# Patient Record
Sex: Female | Born: 1935
Health system: Southern US, Community
[De-identification: ages and names within clinical notes are randomized; demographics above are authoritative.]

## PROBLEM LIST (undated history)

## (undated) DIAGNOSIS — Z9289 Personal history of other medical treatment: Secondary | ICD-10-CM

## (undated) DIAGNOSIS — F0390 Unspecified dementia without behavioral disturbance: Secondary | ICD-10-CM

## (undated) DIAGNOSIS — N189 Chronic kidney disease, unspecified: Secondary | ICD-10-CM

## (undated) DIAGNOSIS — I429 Cardiomyopathy, unspecified: Secondary | ICD-10-CM

## (undated) DIAGNOSIS — C50919 Malignant neoplasm of unspecified site of unspecified female breast: Secondary | ICD-10-CM

## (undated) DIAGNOSIS — E039 Hypothyroidism, unspecified: Secondary | ICD-10-CM

## (undated) DIAGNOSIS — I5022 Chronic systolic (congestive) heart failure: Secondary | ICD-10-CM

## (undated) DIAGNOSIS — M1990 Unspecified osteoarthritis, unspecified site: Secondary | ICD-10-CM

## (undated) HISTORY — DX: Hypothyroidism, unspecified: E03.9

## (undated) HISTORY — DX: Chronic systolic (congestive) heart failure: I50.22

## (undated) HISTORY — PX: KNEE SURGERY: SHX244

## (undated) HISTORY — PX: SHOULDER SURGERY: SHX246

## (undated) HISTORY — DX: Chronic kidney disease, unspecified: N18.9

## (undated) HISTORY — DX: Malignant neoplasm of unspecified site of unspecified female breast: C50.919

## (undated) HISTORY — PX: CHOLECYSTECTOMY: SHX55

## (undated) HISTORY — PX: BILATERAL CARPAL TUNNEL RELEASE: SHX6508

## (undated) HISTORY — PX: EYE SURGERY: SHX253

## (undated) HISTORY — PX: BACK SURGERY: SHX140

## (undated) HISTORY — DX: Personal history of other medical treatment: Z92.89

## (undated) HISTORY — PX: TONSILLECTOMY: SUR1361

## (undated) HISTORY — DX: Cardiomyopathy, unspecified: I42.9

## (undated) HISTORY — PX: ABDOMINAL HYSTERECTOMY: SHX81

---

## 1997-12-14 ENCOUNTER — Encounter: Admission: RE | Admit: 1997-12-14 | Discharge: 1998-03-14 | Payer: Self-pay | Admitting: Anesthesiology

## 1998-04-16 ENCOUNTER — Encounter: Payer: Self-pay | Admitting: Neurosurgery

## 1998-04-18 ENCOUNTER — Encounter: Payer: Self-pay | Admitting: Neurosurgery

## 1998-04-18 ENCOUNTER — Inpatient Hospital Stay (HOSPITAL_COMMUNITY): Admission: RE | Admit: 1998-04-18 | Discharge: 1998-04-24 | Payer: Self-pay | Admitting: Neurosurgery

## 2001-09-20 ENCOUNTER — Encounter (INDEPENDENT_AMBULATORY_CARE_PROVIDER_SITE_OTHER): Payer: Self-pay | Admitting: *Deleted

## 2001-09-20 ENCOUNTER — Ambulatory Visit (HOSPITAL_COMMUNITY): Admission: RE | Admit: 2001-09-20 | Discharge: 2001-09-20 | Payer: Self-pay | Admitting: Gastroenterology

## 2002-07-26 ENCOUNTER — Ambulatory Visit (HOSPITAL_COMMUNITY): Admission: RE | Admit: 2002-07-26 | Discharge: 2002-07-26 | Payer: Self-pay | Admitting: Neurosurgery

## 2002-07-26 ENCOUNTER — Encounter: Payer: Self-pay | Admitting: Neurosurgery

## 2004-09-11 ENCOUNTER — Encounter: Admission: RE | Admit: 2004-09-11 | Discharge: 2004-09-11 | Payer: Self-pay | Admitting: Radiology

## 2004-09-16 ENCOUNTER — Encounter: Admission: RE | Admit: 2004-09-16 | Discharge: 2004-09-16 | Payer: Self-pay | Admitting: General Surgery

## 2004-09-18 ENCOUNTER — Encounter (INDEPENDENT_AMBULATORY_CARE_PROVIDER_SITE_OTHER): Payer: Self-pay | Admitting: Specialist

## 2004-09-18 ENCOUNTER — Encounter (INDEPENDENT_AMBULATORY_CARE_PROVIDER_SITE_OTHER): Payer: Self-pay | Admitting: General Surgery

## 2004-09-18 ENCOUNTER — Ambulatory Visit (HOSPITAL_COMMUNITY): Admission: RE | Admit: 2004-09-18 | Discharge: 2004-09-18 | Payer: Self-pay | Admitting: General Surgery

## 2004-09-18 ENCOUNTER — Ambulatory Visit (HOSPITAL_BASED_OUTPATIENT_CLINIC_OR_DEPARTMENT_OTHER): Admission: RE | Admit: 2004-09-18 | Discharge: 2004-09-19 | Payer: Self-pay | Admitting: General Surgery

## 2004-09-30 ENCOUNTER — Ambulatory Visit: Payer: Self-pay | Admitting: Oncology

## 2004-10-14 ENCOUNTER — Ambulatory Visit: Admission: RE | Admit: 2004-10-14 | Discharge: 2004-12-18 | Payer: Self-pay | Admitting: *Deleted

## 2004-12-10 ENCOUNTER — Ambulatory Visit: Payer: Self-pay | Admitting: Oncology

## 2005-08-08 ENCOUNTER — Ambulatory Visit: Payer: Self-pay | Admitting: Oncology

## 2005-08-12 LAB — CBC WITH DIFFERENTIAL/PLATELET
BASO%: 0.4 % (ref 0.0–2.0)
Basophils Absolute: 0 10*3/uL (ref 0.0–0.1)
EOS%: 2.7 % (ref 0.0–7.0)
HGB: 12.7 g/dL (ref 11.6–15.9)
MCH: 31.2 pg (ref 26.0–34.0)
MCHC: 34.6 g/dL (ref 32.0–36.0)
MCV: 90.2 fL (ref 81.0–101.0)
MONO%: 7.4 % (ref 0.0–13.0)
RBC: 4.06 10*6/uL (ref 3.70–5.32)
RDW: 13.9 % (ref 11.3–14.5)
lymph#: 1.6 10*3/uL (ref 0.9–3.3)

## 2005-08-12 LAB — COMPREHENSIVE METABOLIC PANEL
ALT: 21 U/L (ref 0–40)
AST: 28 U/L (ref 0–37)
Albumin: 4.4 g/dL (ref 3.5–5.2)
Alkaline Phosphatase: 60 U/L (ref 39–117)
Calcium: 9.4 mg/dL (ref 8.4–10.5)
Chloride: 109 mEq/L (ref 96–112)
Potassium: 4.2 mEq/L (ref 3.5–5.3)
Sodium: 142 mEq/L (ref 135–145)
Total Protein: 6.9 g/dL (ref 6.0–8.3)

## 2005-08-12 LAB — CANCER ANTIGEN 27.29: CA 27.29: 20 U/mL (ref 0–39)

## 2005-11-06 ENCOUNTER — Ambulatory Visit: Payer: Self-pay | Admitting: Oncology

## 2005-11-10 LAB — CBC WITH DIFFERENTIAL/PLATELET
BASO%: 0.4 % (ref 0.0–2.0)
Basophils Absolute: 0 10*3/uL (ref 0.0–0.1)
EOS%: 1.3 % (ref 0.0–7.0)
HCT: 34.6 % — ABNORMAL LOW (ref 34.8–46.6)
MCH: 31 pg (ref 26.0–34.0)
MCHC: 34 g/dL (ref 32.0–36.0)
MCV: 91.2 fL (ref 81.0–101.0)
MONO%: 5.9 % (ref 0.0–13.0)
NEUT%: 69.6 % (ref 39.6–76.8)
lymph#: 1.8 10*3/uL (ref 0.9–3.3)

## 2006-02-18 ENCOUNTER — Ambulatory Visit: Payer: Self-pay | Admitting: Oncology

## 2006-02-23 LAB — CBC WITH DIFFERENTIAL/PLATELET
Basophils Absolute: 0 10*3/uL (ref 0.0–0.1)
EOS%: 1.6 % (ref 0.0–7.0)
Eosinophils Absolute: 0.1 10*3/uL (ref 0.0–0.5)
HCT: 35.5 % (ref 34.8–46.6)
HGB: 12.1 g/dL (ref 11.6–15.9)
MONO#: 0.5 10*3/uL (ref 0.1–0.9)
NEUT#: 4.8 10*3/uL (ref 1.5–6.5)
NEUT%: 71.9 % (ref 39.6–76.8)
RDW: 13.5 % (ref 11.3–14.5)
WBC: 6.7 10*3/uL (ref 3.9–10.0)
lymph#: 1.3 10*3/uL (ref 0.9–3.3)

## 2006-02-23 LAB — COMPREHENSIVE METABOLIC PANEL
AST: 24 U/L (ref 0–37)
Albumin: 4.2 g/dL (ref 3.5–5.2)
BUN: 17 mg/dL (ref 6–23)
CO2: 23 mEq/L (ref 19–32)
Calcium: 9.2 mg/dL (ref 8.4–10.5)
Chloride: 107 mEq/L (ref 96–112)
Glucose, Bld: 84 mg/dL (ref 70–99)
Potassium: 4.3 mEq/L (ref 3.5–5.3)

## 2006-02-23 LAB — CANCER ANTIGEN 27.29: CA 27.29: 23 U/mL (ref 0–39)

## 2006-05-26 ENCOUNTER — Ambulatory Visit: Payer: Self-pay | Admitting: Oncology

## 2006-05-28 LAB — COMPREHENSIVE METABOLIC PANEL
AST: 30 U/L (ref 0–37)
Albumin: 3.8 g/dL (ref 3.5–5.2)
Alkaline Phosphatase: 73 U/L (ref 39–117)
Glucose, Bld: 91 mg/dL (ref 70–99)
Potassium: 4 mEq/L (ref 3.5–5.3)
Sodium: 139 mEq/L (ref 135–145)
Total Protein: 6.8 g/dL (ref 6.0–8.3)

## 2006-05-28 LAB — CBC WITH DIFFERENTIAL/PLATELET
EOS%: 1.7 % (ref 0.0–7.0)
Eosinophils Absolute: 0.1 10*3/uL (ref 0.0–0.5)
MCV: 87.4 fL (ref 81.0–101.0)
MONO%: 7.1 % (ref 0.0–13.0)
NEUT#: 5 10*3/uL (ref 1.5–6.5)
RBC: 3.82 10*6/uL (ref 3.70–5.32)
RDW: 14 % (ref 11.3–14.5)
lymph#: 1.4 10*3/uL (ref 0.9–3.3)

## 2006-06-25 ENCOUNTER — Encounter: Admission: RE | Admit: 2006-06-25 | Discharge: 2006-06-25 | Payer: Self-pay | Admitting: Family Medicine

## 2006-08-24 ENCOUNTER — Ambulatory Visit (HOSPITAL_COMMUNITY): Admission: RE | Admit: 2006-08-24 | Discharge: 2006-08-24 | Payer: Self-pay | Admitting: Oncology

## 2006-08-24 ENCOUNTER — Ambulatory Visit: Payer: Self-pay | Admitting: Oncology

## 2006-08-24 LAB — CBC WITH DIFFERENTIAL/PLATELET
EOS%: 1.7 % (ref 0.0–7.0)
MCH: 31 pg (ref 26.0–34.0)
MCV: 87.6 fL (ref 81.0–101.0)
MONO%: 8.1 % (ref 0.0–13.0)
RBC: 3.95 10*6/uL (ref 3.70–5.32)
RDW: 15.5 % — ABNORMAL HIGH (ref 11.3–14.5)

## 2006-08-24 LAB — COMPREHENSIVE METABOLIC PANEL
AST: 30 U/L (ref 0–37)
Albumin: 4.1 g/dL (ref 3.5–5.2)
Alkaline Phosphatase: 67 U/L (ref 39–117)
Potassium: 4.2 mEq/L (ref 3.5–5.3)
Sodium: 139 mEq/L (ref 135–145)
Total Protein: 7.3 g/dL (ref 6.0–8.3)

## 2006-11-20 ENCOUNTER — Ambulatory Visit: Payer: Self-pay | Admitting: Oncology

## 2006-11-24 LAB — COMPREHENSIVE METABOLIC PANEL
ALT: 15 U/L (ref 0–35)
AST: 26 U/L (ref 0–37)
Alkaline Phosphatase: 69 U/L (ref 39–117)
Glucose, Bld: 72 mg/dL (ref 70–99)
Sodium: 141 mEq/L (ref 135–145)
Total Bilirubin: 0.3 mg/dL (ref 0.3–1.2)
Total Protein: 6.6 g/dL (ref 6.0–8.3)

## 2006-11-24 LAB — CBC WITH DIFFERENTIAL/PLATELET
BASO%: 1.5 % (ref 0.0–2.0)
HCT: 31.1 % — ABNORMAL LOW (ref 34.8–46.6)
LYMPH%: 28.5 % (ref 14.0–48.0)
MCHC: 35.2 g/dL (ref 32.0–36.0)
MONO#: 0.5 10*3/uL (ref 0.1–0.9)
NEUT%: 51.5 % (ref 39.6–76.8)
Platelets: 271 10*3/uL (ref 145–400)
WBC: 5.9 10*3/uL (ref 3.9–10.0)

## 2007-05-21 ENCOUNTER — Ambulatory Visit: Payer: Self-pay | Admitting: Oncology

## 2007-07-02 ENCOUNTER — Ambulatory Visit: Payer: Self-pay | Admitting: Oncology

## 2007-07-06 LAB — COMPREHENSIVE METABOLIC PANEL
ALT: 21 U/L (ref 0–35)
AST: 33 U/L (ref 0–37)
CO2: 25 mEq/L (ref 19–32)
Calcium: 9.1 mg/dL (ref 8.4–10.5)
Chloride: 110 mEq/L (ref 96–112)
Sodium: 141 mEq/L (ref 135–145)
Total Bilirubin: 0.7 mg/dL (ref 0.3–1.2)
Total Protein: 6.8 g/dL (ref 6.0–8.3)

## 2007-07-06 LAB — CBC WITH DIFFERENTIAL/PLATELET
BASO%: 0.4 % (ref 0.0–2.0)
EOS%: 4.8 % (ref 0.0–7.0)
HCT: 33 % — ABNORMAL LOW (ref 34.8–46.6)
LYMPH%: 26.8 % (ref 14.0–48.0)
MCH: 30.6 pg (ref 26.0–34.0)
MCHC: 34.7 g/dL (ref 32.0–36.0)
NEUT%: 59.4 % (ref 39.6–76.8)
Platelets: 251 10*3/uL (ref 145–400)
lymph#: 1.8 10*3/uL (ref 0.9–3.3)

## 2007-07-06 LAB — CANCER ANTIGEN 27.29: CA 27.29: 19 U/mL (ref 0–39)

## 2008-02-24 ENCOUNTER — Ambulatory Visit: Payer: Self-pay | Admitting: Oncology

## 2008-03-09 LAB — CBC WITH DIFFERENTIAL/PLATELET
BASO%: 0.6 % (ref 0.0–2.0)
EOS%: 7.7 % — ABNORMAL HIGH (ref 0.0–7.0)
MCH: 30.3 pg (ref 25.1–34.0)
MCHC: 34.1 g/dL (ref 31.5–36.0)
NEUT%: 55.7 % (ref 38.4–76.8)
RBC: 3.8 10*6/uL (ref 3.70–5.45)
RDW: 14.3 % (ref 11.2–14.5)
lymph#: 1.6 10*3/uL (ref 0.9–3.3)

## 2008-03-09 LAB — COMPREHENSIVE METABOLIC PANEL
ALT: 19 U/L (ref 0–35)
AST: 30 U/L (ref 0–37)
Calcium: 8.5 mg/dL (ref 8.4–10.5)
Chloride: 111 mEq/L (ref 96–112)
Creatinine, Ser: 0.69 mg/dL (ref 0.40–1.20)
Potassium: 3.4 mEq/L — ABNORMAL LOW (ref 3.5–5.3)
Sodium: 140 mEq/L (ref 135–145)

## 2008-09-13 ENCOUNTER — Ambulatory Visit: Payer: Self-pay | Admitting: Oncology

## 2008-09-15 LAB — COMPREHENSIVE METABOLIC PANEL
AST: 22 U/L (ref 0–37)
Albumin: 3.8 g/dL (ref 3.5–5.2)
Alkaline Phosphatase: 55 U/L (ref 39–117)
BUN: 22 mg/dL (ref 6–23)
Potassium: 4.5 mEq/L (ref 3.5–5.3)
Sodium: 143 mEq/L (ref 135–145)
Total Bilirubin: 0.4 mg/dL (ref 0.3–1.2)
Total Protein: 6.5 g/dL (ref 6.0–8.3)

## 2008-09-15 LAB — CBC WITH DIFFERENTIAL/PLATELET
EOS%: 8.3 % — ABNORMAL HIGH (ref 0.0–7.0)
LYMPH%: 24.3 % (ref 14.0–49.7)
MCH: 30.3 pg (ref 25.1–34.0)
MCV: 89.1 fL (ref 79.5–101.0)
MONO%: 9.6 % (ref 0.0–14.0)
RBC: 3.62 10*6/uL — ABNORMAL LOW (ref 3.70–5.45)
RDW: 14.8 % — ABNORMAL HIGH (ref 11.2–14.5)

## 2008-09-15 LAB — RETICULOCYTES
Immature Retic Fract: 9.3 % (ref 0.00–10.70)
Retic %: 0.94 % (ref 0.50–1.50)

## 2008-09-15 LAB — FERRITIN: Ferritin: 9 ng/mL — ABNORMAL LOW (ref 10–291)

## 2008-09-15 LAB — VITAMIN B12: Vitamin B-12: 1235 pg/mL — ABNORMAL HIGH (ref 211–911)

## 2008-09-15 LAB — FOLATE: Folate: >20 ng/mL

## 2008-10-19 ENCOUNTER — Ambulatory Visit: Payer: Self-pay | Admitting: Oncology

## 2008-10-23 LAB — CBC & DIFF AND RETIC
BASO%: 0.5 % (ref 0.0–2.0)
EOS%: 5 % (ref 0.0–7.0)
Eosinophils Absolute: 0.4 10*3/uL (ref 0.0–0.5)
HCT: 32.7 % — ABNORMAL LOW (ref 34.8–46.6)
HGB: 10.9 g/dL — ABNORMAL LOW (ref 11.6–15.9)
Immature Retic Fract: 4.6 % (ref 0.00–10.70)
LYMPH%: 21.3 % (ref 14.0–49.7)
MCH: 30 pg (ref 25.1–34.0)
MCHC: 33.3 g/dL (ref 31.5–36.0)
MONO#: 0.7 10*3/uL (ref 0.1–0.9)
MONO%: 7.9 % (ref 0.0–14.0)
RDW: 15.3 % — ABNORMAL HIGH (ref 11.2–14.5)
Retic %: 1.12 % (ref 0.50–1.50)
lymph#: 1.9 10*3/uL (ref 0.9–3.3)
nRBC: 0 % (ref 0–0)

## 2008-10-23 LAB — FERRITIN: Ferritin: 19 ng/mL (ref 10–291)

## 2008-11-01 LAB — CBC WITH DIFFERENTIAL/PLATELET
EOS%: 7.5 % — ABNORMAL HIGH (ref 0.0–7.0)
HCT: 33.8 % — ABNORMAL LOW (ref 34.8–46.6)
MCH: 30.5 pg (ref 25.1–34.0)
MCHC: 33.4 g/dL (ref 31.5–36.0)
MCV: 91.4 fL (ref 79.5–101.0)
NEUT%: 53.2 % (ref 38.4–76.8)
Platelets: 212 10*3/uL (ref 145–400)
RBC: 3.7 10*6/uL (ref 3.70–5.45)
RDW: 15.3 % — ABNORMAL HIGH (ref 11.2–14.5)
WBC: 6.6 10*3/uL (ref 3.9–10.3)

## 2008-12-04 ENCOUNTER — Ambulatory Visit: Payer: Self-pay | Admitting: Oncology

## 2008-12-06 LAB — CBC & DIFF AND RETIC
HGB: 10.9 g/dL — ABNORMAL LOW (ref 11.6–15.9)
MCH: 31.1 pg (ref 25.1–34.0)
MCHC: 33.2 g/dL (ref 31.5–36.0)
NEUT%: 66.8 % (ref 38.4–76.8)
Platelets: 200 10*3/uL (ref 145–400)
RBC: 3.5 10*6/uL — ABNORMAL LOW (ref 3.70–5.45)
Retic Ct Abs: 50.05 10*3/uL (ref 18.30–72.70)
WBC: 7.3 10*3/uL (ref 3.9–10.3)

## 2008-12-06 LAB — FERRITIN: Ferritin: 362 ng/mL — ABNORMAL HIGH (ref 10–291)

## 2009-01-18 ENCOUNTER — Ambulatory Visit: Payer: Self-pay | Admitting: Oncology

## 2009-01-22 ENCOUNTER — Ambulatory Visit (HOSPITAL_COMMUNITY): Admission: RE | Admit: 2009-01-22 | Discharge: 2009-01-22 | Payer: Self-pay | Admitting: Oncology

## 2009-01-22 LAB — COMPREHENSIVE METABOLIC PANEL
AST: 30 U/L (ref 0–37)
Alkaline Phosphatase: 44 U/L (ref 39–117)
CO2: 26 mEq/L (ref 19–32)
Creatinine, Ser: 0.9 mg/dL (ref 0.40–1.20)
Glucose, Bld: 82 mg/dL (ref 70–99)
Potassium: 4.1 mEq/L (ref 3.5–5.3)
Total Protein: 6.8 g/dL (ref 6.0–8.3)

## 2009-01-22 LAB — CBC & DIFF AND RETIC
BASO%: 0.5 % (ref 0.0–2.0)
Basophils Absolute: 0 10*3/uL (ref 0.0–0.1)
Eosinophils Absolute: 0.3 10*3/uL (ref 0.0–0.5)
HGB: 11.4 g/dL — ABNORMAL LOW (ref 11.6–15.9)
LYMPH%: 19.5 % (ref 14.0–49.7)
NEUT#: 5.6 10*3/uL (ref 1.5–6.5)
RBC: 3.61 10*6/uL — ABNORMAL LOW (ref 3.70–5.45)
RDW: 13.5 % (ref 11.2–14.5)
Retic %: 1.19 % (ref 0.50–1.50)
lymph#: 1.6 10*3/uL (ref 0.9–3.3)

## 2009-01-22 LAB — CANCER ANTIGEN 27.29: CA 27.29: 19 U/mL (ref 0–39)

## 2009-05-15 ENCOUNTER — Ambulatory Visit: Payer: Self-pay | Admitting: Oncology

## 2009-05-16 LAB — COMPREHENSIVE METABOLIC PANEL
ALT: 14 U/L (ref 0–35)
Albumin: 4.3 g/dL (ref 3.5–5.2)
BUN: 16 mg/dL (ref 6–23)
CO2: 20 mEq/L (ref 19–32)
Calcium: 9.5 mg/dL (ref 8.4–10.5)
Chloride: 106 mEq/L (ref 96–112)
Total Bilirubin: 0.5 mg/dL (ref 0.3–1.2)
Total Protein: 6.9 g/dL (ref 6.0–8.3)

## 2009-05-16 LAB — CBC & DIFF AND RETIC
BASO%: 0.3 % (ref 0.0–2.0)
Basophils Absolute: 0 10*3/uL (ref 0.0–0.1)
HGB: 12.6 g/dL (ref 11.6–15.9)
Immature Retic Fract: 3.8 % (ref 0.00–10.70)
MCHC: 33.8 g/dL (ref 31.5–36.0)
MONO#: 0.5 10*3/uL (ref 0.1–0.9)
MONO%: 7.3 % (ref 0.0–14.0)
RBC: 4.03 10*6/uL (ref 3.70–5.45)
RDW: 13.3 % (ref 11.2–14.5)
WBC: 6.6 10*3/uL (ref 3.9–10.3)
lymph#: 1.8 10*3/uL (ref 0.9–3.3)

## 2009-05-16 LAB — FERRITIN: Ferritin: 203 ng/mL (ref 10–291)

## 2009-05-16 LAB — VITAMIN D 25 HYDROXY (VIT D DEFICIENCY, FRACTURES): Vit D, 25-Hydroxy: 34 ng/mL (ref 30–89)

## 2009-09-17 ENCOUNTER — Ambulatory Visit: Payer: Self-pay | Admitting: Oncology

## 2009-09-17 LAB — CBC WITH DIFFERENTIAL/PLATELET
BASO%: 0.8 % (ref 0.0–2.0)
Basophils Absolute: 0.1 10*3/uL (ref 0.0–0.1)
EOS%: 7.1 % — ABNORMAL HIGH (ref 0.0–7.0)
HCT: 37 % (ref 34.8–46.6)
HGB: 12.5 g/dL (ref 11.6–15.9)
LYMPH%: 16.9 % (ref 14.0–49.7)
MCV: 94 fL (ref 79.5–101.0)
MONO%: 9.1 % (ref 0.0–14.0)
NEUT#: 4.9 10*3/uL (ref 1.5–6.5)
NEUT%: 66.1 % (ref 38.4–76.8)
Platelets: 252 10*3/uL (ref 145–400)
RDW: 13.9 % (ref 11.2–14.5)

## 2009-09-17 LAB — FERRITIN: Ferritin: 205 ng/mL (ref 10–291)

## 2010-05-21 ENCOUNTER — Other Ambulatory Visit: Payer: Self-pay | Admitting: Oncology

## 2010-05-21 ENCOUNTER — Encounter (HOSPITAL_BASED_OUTPATIENT_CLINIC_OR_DEPARTMENT_OTHER): Payer: Medicare Other | Admitting: Oncology

## 2010-05-21 DIAGNOSIS — D649 Anemia, unspecified: Secondary | ICD-10-CM

## 2010-05-21 DIAGNOSIS — C50219 Malignant neoplasm of upper-inner quadrant of unspecified female breast: Secondary | ICD-10-CM

## 2010-05-21 DIAGNOSIS — D509 Iron deficiency anemia, unspecified: Secondary | ICD-10-CM

## 2010-05-21 LAB — CBC WITH DIFFERENTIAL/PLATELET
BASO%: 0.5 % (ref 0.0–2.0)
EOS%: 4.8 % (ref 0.0–7.0)
HCT: 36.6 % (ref 34.8–46.6)
HGB: 12.6 g/dL (ref 11.6–15.9)
LYMPH%: 24.2 % (ref 14.0–49.7)
MCH: 31.6 pg (ref 25.1–34.0)
NEUT#: 3.8 10*3/uL (ref 1.5–6.5)
Platelets: 247 10*3/uL (ref 145–400)
WBC: 6.1 10*3/uL (ref 3.9–10.3)
lymph#: 1.5 10*3/uL (ref 0.9–3.3)

## 2010-05-21 LAB — COMPREHENSIVE METABOLIC PANEL
ALT: 15 U/L (ref 0–35)
Albumin: 4.2 g/dL (ref 3.5–5.2)
Alkaline Phosphatase: 44 U/L (ref 39–117)
BUN: 21 mg/dL (ref 6–23)
CO2: 21 mEq/L (ref 19–32)
Chloride: 107 mEq/L (ref 96–112)
Creatinine, Ser: 0.99 mg/dL (ref 0.40–1.20)
Potassium: 4.6 mEq/L (ref 3.5–5.3)
Total Bilirubin: 0.5 mg/dL (ref 0.3–1.2)

## 2010-05-21 LAB — FERRITIN: Ferritin: 122 ng/mL (ref 10–291)

## 2010-05-24 NOTE — Op Note (Signed)
Andrea Gilmore, Andrea Gilmore             ACCOUNT NO.:  [id number]   MEDICAL RECORD NO.:  [id number]          PATIENT TYPE:  AMB   LOCATION:  DSC                          FACILITY:  [facility]   PHYSICIAN:  Gita Kudo, M.D. DATE OF BIRTH:  15-Apr-1925   DATE OF PROCEDURE:  09/18/2004  DATE OF DISCHARGE:                                 OPERATIVE REPORT   OPERATIVE PROCEDURE:  1.  Left partial mastectomy.  2.  Left axillary sentinel lymph node biopsy.   SURGEON:  Gita Kudo, M.D.   ANESTHESIA:  General.   PREOPERATIVE DIAGNOSIS:  Ductal carcinoma in situ, locally extensive, left  breast.   POSTOPERATIVE DIAGNOSES:  1.  Ductal carcinoma in situ, locally extensive, left breast, radiologically      very good margins.  2.  Histologic-negative sentinel lymph node.   CLINICAL SUMMARY:  [age]-year-old female in good general health  without any specific problems, found to have an abnormal calcification on  mammograms.  Biopsy showed DCIS.  MRI did not show any other abnormality.  She comes in for partial mastectomy and sentinel node biopsy because of the  size of the DCIS.   OPERATIVE FINDINGS:  The area was well-bracketed by Dr. Jeralyn Ruths.  The report came back stating that we got around the markers well.  The lymph  node was hot and blue, and after removal, the count went down to negligible.  Histology showed that this was benign.   OPERATIVE PROCEDURE:  Under satisfactory general anesthesia, the patient was  positioned, infiltrated with Lymphazurin blue subareolarly and then prepped,  draped and positioned in a standard fashion.  Referring to the wire  placement by Dr. Yolanda Bonine, an elliptical incision was made, transversely  oriented, encompassing the wires.  Then staying widely around the wires, I  continued the dissection down to the chest wall and removed this portion of  the breast which was extending from approximately 9 o'clock through 1  o'clock.  The  specimen was then marked with clips and sent for pathologic  verification.  The wound lavaged with saline, infiltrated with Marcaine.  Made hemostatic by cautery and then a packing applied temporarily.   The NeoProbe was then used to scan the axilla and then a curved incision  made over the hottest area.  This was carried down to the pectoralis muscle,  which was retracted medially and self-retaining retractors placed.  Using  the blue dye as a guide, I identified 1 medium-sized node that was quite hot  on NeoProbe scanning.  This was removed after applying clips all around.  Scanning showed it to be quite hot and it was sent for pathologic exam.  The  axilla was then scanned again with the NeoProbe and no further activity.   Both wounds were lavaged with saline, infiltrated with Xylocaine and closed  in layers with 2-0 and 3-0 Vicryl and staples for skin.   Dr. Yolanda Bonine reported that the area in question was well-removed  radiologically and Dr. Laureen Ochs that the lymph node was negative.  Therefore, a  sterile absorbent dressing was applied and the patient went to  the recovery  room from the operating room in good condition without complication.           ______________________________  Gita Kudo, M.D.     MRL/MEDQ  D:  09/18/2004  T:  09/19/2004  Job:  [id number]   cc:   Jeralyn Ruths MD   Donia Guiles, M.D.  [address] [place]  [place] [address]  Fax: [phone]

## 2010-05-28 ENCOUNTER — Encounter (HOSPITAL_BASED_OUTPATIENT_CLINIC_OR_DEPARTMENT_OTHER): Payer: Medicare Other | Admitting: Oncology

## 2010-05-28 ENCOUNTER — Ambulatory Visit (HOSPITAL_COMMUNITY)
Admission: RE | Admit: 2010-05-28 | Discharge: 2010-05-28 | Disposition: A | Discharge status: Home / Self Care | Payer: Medicare Other | Source: Ambulatory Visit | Attending: Oncology | Admitting: Oncology

## 2010-05-28 ENCOUNTER — Other Ambulatory Visit: Payer: Self-pay | Admitting: Oncology

## 2010-05-28 DIAGNOSIS — C50919 Malignant neoplasm of unspecified site of unspecified female breast: Secondary | ICD-10-CM

## 2010-05-28 DIAGNOSIS — R059 Cough, unspecified: Secondary | ICD-10-CM | POA: Insufficient documentation

## 2010-05-28 DIAGNOSIS — R0602 Shortness of breath: Secondary | ICD-10-CM

## 2010-05-28 DIAGNOSIS — R05 Cough: Secondary | ICD-10-CM | POA: Insufficient documentation

## 2010-05-28 DIAGNOSIS — C50219 Malignant neoplasm of upper-inner quadrant of unspecified female breast: Secondary | ICD-10-CM

## 2010-07-05 ENCOUNTER — Ambulatory Visit
Admission: RE | Admit: 2010-07-05 | Discharge: 2010-07-05 | Disposition: A | Discharge status: Home / Self Care | Payer: Medicare Other | Source: Ambulatory Visit | Attending: Family Medicine | Admitting: Family Medicine

## 2010-07-05 ENCOUNTER — Other Ambulatory Visit: Payer: Self-pay | Admitting: Family Medicine

## 2010-07-05 DIAGNOSIS — R202 Paresthesia of skin: Secondary | ICD-10-CM

## 2010-07-12 ENCOUNTER — Other Ambulatory Visit: Payer: Self-pay | Admitting: Family Medicine

## 2010-07-12 DIAGNOSIS — R202 Paresthesia of skin: Secondary | ICD-10-CM

## 2010-07-12 DIAGNOSIS — R200 Anesthesia of skin: Secondary | ICD-10-CM

## 2010-07-19 ENCOUNTER — Ambulatory Visit
Admission: RE | Admit: 2010-07-19 | Discharge: 2010-07-19 | Disposition: A | Discharge status: Home / Self Care | Payer: Medicare Other | Source: Ambulatory Visit | Attending: Family Medicine | Admitting: Family Medicine

## 2010-07-19 DIAGNOSIS — R200 Anesthesia of skin: Secondary | ICD-10-CM

## 2010-07-19 DIAGNOSIS — R202 Paresthesia of skin: Secondary | ICD-10-CM

## 2010-10-21 ENCOUNTER — Emergency Department (HOSPITAL_COMMUNITY): Payer: Medicare Other

## 2010-10-21 ENCOUNTER — Inpatient Hospital Stay (HOSPITAL_COMMUNITY)
Admission: EM | Admit: 2010-10-21 | Discharge: 2010-10-23 | DRG: 069 | Disposition: A | Discharge status: Home / Self Care | Payer: Medicare Other | Source: Ambulatory Visit | Attending: Internal Medicine | Admitting: Internal Medicine

## 2010-10-21 DIAGNOSIS — G459 Transient cerebral ischemic attack, unspecified: Principal | ICD-10-CM | POA: Diagnosis present

## 2010-10-21 DIAGNOSIS — R209 Unspecified disturbances of skin sensation: Secondary | ICD-10-CM | POA: Diagnosis present

## 2010-10-21 DIAGNOSIS — R42 Dizziness and giddiness: Secondary | ICD-10-CM | POA: Diagnosis present

## 2010-10-21 DIAGNOSIS — E039 Hypothyroidism, unspecified: Secondary | ICD-10-CM | POA: Diagnosis present

## 2010-10-21 LAB — CK TOTAL AND CKMB (NOT AT ARMC): CK, MB: 3.6 ng/mL (ref 0.3–4.0)

## 2010-10-21 LAB — COMPREHENSIVE METABOLIC PANEL
ALT: 15 U/L (ref 0–35)
AST: 27 U/L (ref 0–37)
Albumin: 3.9 g/dL (ref 3.5–5.2)
Alkaline Phosphatase: 49 U/L (ref 39–117)
BUN: 17 mg/dL (ref 6–23)
Chloride: 108 mEq/L (ref 96–112)
Potassium: 4.1 mEq/L (ref 3.5–5.1)
Sodium: 140 mEq/L (ref 135–145)
Total Bilirubin: 0.3 mg/dL (ref 0.3–1.2)
Total Protein: 7.1 g/dL (ref 6.0–8.3)

## 2010-10-21 LAB — POCT I-STAT, CHEM 8
BUN: 17 mg/dL (ref 6–23)
Calcium, Ion: 1.17 mmol/L (ref 1.12–1.32)
Glucose, Bld: 101 mg/dL — ABNORMAL HIGH (ref 70–99)
HCT: 36 % (ref 36.0–46.0)
TCO2: 21 mmol/L (ref 0–100)

## 2010-10-21 LAB — GLUCOSE, CAPILLARY: Glucose-Capillary: 95 mg/dL (ref 70–99)

## 2010-10-21 LAB — DIFFERENTIAL
Basophils Relative: 0 % (ref 0–1)
Eosinophils Absolute: 0.1 10*3/uL (ref 0.0–0.7)
Eosinophils Relative: 1 % (ref 0–5)
Lymphs Abs: 1.2 10*3/uL (ref 0.7–4.0)
Monocytes Relative: 5 % (ref 3–12)
Neutrophils Relative %: 80 % — ABNORMAL HIGH (ref 43–77)

## 2010-10-21 LAB — LIPID PANEL
HDL: 54 mg/dL (ref >39)
LDL Cholesterol: 137 mg/dL — ABNORMAL HIGH (ref 0–99)
Total CHOL/HDL Ratio: 4 RATIO
VLDL: 27 mg/dL (ref 0–40)

## 2010-10-21 LAB — TROPONIN I: Troponin I: <0.3 ng/mL (ref <0.30)

## 2010-10-21 LAB — CBC
Hemoglobin: 11.7 g/dL — ABNORMAL LOW (ref 12.0–15.0)
MCH: 30.3 pg (ref 26.0–34.0)
MCHC: 33.9 g/dL (ref 30.0–36.0)
MCV: 89.4 fL (ref 78.0–100.0)

## 2010-10-21 LAB — PROTIME-INR: Prothrombin Time: 13 seconds (ref 11.6–15.2)

## 2010-10-22 LAB — BASIC METABOLIC PANEL
BUN: 14 mg/dL (ref 6–23)
Chloride: 112 mEq/L (ref 96–112)
GFR calc Af Amer: 76 mL/min — ABNORMAL LOW (ref >90)
GFR calc non Af Amer: 65 mL/min — ABNORMAL LOW (ref >90)
Glucose, Bld: 85 mg/dL (ref 70–99)
Potassium: 4.1 mEq/L (ref 3.5–5.1)
Sodium: 143 mEq/L (ref 135–145)

## 2010-10-22 LAB — CARDIAC PANEL(CRET KIN+CKTOT+MB+TROPI)
CK, MB: 3.4 ng/mL (ref 0.3–4.0)
CK, MB: 3.1 ng/mL (ref 0.3–4.0)
Relative Index: INVALID (ref 0.0–2.5)
Total CK: 50 U/L (ref 7–177)
Troponin I: <0.3 ng/mL (ref <0.30)

## 2010-10-22 LAB — CBC
HCT: 33.8 % — ABNORMAL LOW (ref 36.0–46.0)
Hemoglobin: 11.2 g/dL — ABNORMAL LOW (ref 12.0–15.0)
MCH: 30.3 pg (ref 26.0–34.0)
MCV: 91.4 fL (ref 78.0–100.0)
RBC: 3.7 MIL/uL — ABNORMAL LOW (ref 3.87–5.11)
WBC: 6.4 10*3/uL (ref 4.0–10.5)

## 2010-10-23 LAB — CBC
Hemoglobin: 10.9 g/dL — ABNORMAL LOW (ref 12.0–15.0)
MCH: 30.7 pg (ref 26.0–34.0)
MCHC: 34.1 g/dL (ref 30.0–36.0)
Platelets: 236 10*3/uL (ref 150–400)
RBC: 3.55 MIL/uL — ABNORMAL LOW (ref 3.87–5.11)

## 2010-10-23 LAB — BASIC METABOLIC PANEL
Calcium: 9.2 mg/dL (ref 8.4–10.5)
GFR calc non Af Amer: 56 mL/min — ABNORMAL LOW (ref >90)
Glucose, Bld: 91 mg/dL (ref 70–99)
Potassium: 4.8 mEq/L (ref 3.5–5.1)
Sodium: 143 mEq/L (ref 135–145)

## 2010-11-18 ENCOUNTER — Other Ambulatory Visit: Payer: Self-pay | Admitting: Oncology

## 2010-11-18 ENCOUNTER — Other Ambulatory Visit (HOSPITAL_BASED_OUTPATIENT_CLINIC_OR_DEPARTMENT_OTHER): Payer: Medicare Other

## 2010-11-18 DIAGNOSIS — C50219 Malignant neoplasm of upper-inner quadrant of unspecified female breast: Secondary | ICD-10-CM

## 2010-11-18 DIAGNOSIS — D649 Anemia, unspecified: Secondary | ICD-10-CM

## 2010-11-18 DIAGNOSIS — D509 Iron deficiency anemia, unspecified: Secondary | ICD-10-CM

## 2010-11-18 LAB — COMPREHENSIVE METABOLIC PANEL
Albumin: 4.5 g/dL (ref 3.5–5.2)
Alkaline Phosphatase: 42 U/L (ref 39–117)
BUN: 23 mg/dL (ref 6–23)
CO2: 20 mEq/L (ref 19–32)
Calcium: 9.7 mg/dL (ref 8.4–10.5)
Chloride: 108 mEq/L (ref 96–112)
Glucose, Bld: 84 mg/dL (ref 70–99)
Potassium: 4.6 mEq/L (ref 3.5–5.3)
Sodium: 142 mEq/L (ref 135–145)
Total Protein: 6.8 g/dL (ref 6.0–8.3)

## 2010-11-18 LAB — CBC WITH DIFFERENTIAL/PLATELET
Basophils Absolute: 0.1 10*3/uL (ref 0.0–0.1)
Eosinophils Absolute: 0.2 10*3/uL (ref 0.0–0.5)
HGB: 11.9 g/dL (ref 11.6–15.9)
MONO#: 0.6 10*3/uL (ref 0.1–0.9)
NEUT#: 4.5 10*3/uL (ref 1.5–6.5)
RBC: 3.78 10*6/uL (ref 3.70–5.45)
RDW: 13.6 % (ref 11.2–14.5)
WBC: 7.1 10*3/uL (ref 3.9–10.3)
lymph#: 1.7 10*3/uL (ref 0.9–3.3)

## 2010-11-18 LAB — CANCER ANTIGEN 27.29: CA 27.29: 19 U/mL (ref 0–39)

## 2010-11-22 ENCOUNTER — Encounter: Payer: Self-pay | Admitting: *Deleted

## 2010-11-25 ENCOUNTER — Telehealth: Payer: Self-pay | Admitting: *Deleted

## 2010-11-25 ENCOUNTER — Ambulatory Visit (HOSPITAL_BASED_OUTPATIENT_CLINIC_OR_DEPARTMENT_OTHER): Payer: Medicare Other | Admitting: Oncology

## 2010-11-25 VITALS — BP 146/69 | HR 56 | Temp 97.6°F | Ht 60.0 in | Wt 120.5 lb

## 2010-11-25 DIAGNOSIS — M129 Arthropathy, unspecified: Secondary | ICD-10-CM

## 2010-11-25 DIAGNOSIS — M549 Dorsalgia, unspecified: Secondary | ICD-10-CM

## 2010-11-25 DIAGNOSIS — Z853 Personal history of malignant neoplasm of breast: Secondary | ICD-10-CM

## 2010-11-25 DIAGNOSIS — C50919 Malignant neoplasm of unspecified site of unspecified female breast: Secondary | ICD-10-CM

## 2010-11-25 NOTE — Progress Notes (Signed)
ID: Radene Ou   Interval History: When he returns today for followup of her breast cancer. Interval history is significant for having had bilateral carpal tunnel release. This went well, and the other problems she had was a temporary dullness in her right face, which landed her in the hospital for 2 days with extensive workup being negative. 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  ROS:  She complains of being tired and wants to know why she is so tired than she tells me that she back 17 bags of leaves in one day, that she does all her housework, and as stated above the she's cooking not only for herself before her daughter's Thanksgiving's gathering. She's also still working at [employer]. She does have a variety of chronic complaints including difficulty sleeping, cramping, urinary leakage, poor appetite, low back pain, and a little bit of forgetfulness. Otherwise a detailed review of systems was stable    Medications: I have reviewed the patient's current medications.   Current Outpatient Prescriptions  Medication Sig Dispense Refill  . bimatoprost (LUMIGAN) 0.01 % SOLN 1 drop at bedtime.        . gabapentin (NEURONTIN) 100 MG capsule Take 100 mg by mouth at bedtime as needed.        Marland Kitchen aspirin 325 MG buffered tablet Take 81 mg by mouth daily.       . calcium carbonate (OS-CAL) 600 MG TABS Take 600 mg by mouth 2 (two) times daily with a meal. With D      . diazepam (VALIUM) 5 MG tablet Take 5 mg by mouth every 6 (six) hours as needed.        Marland Kitchen levothyroxine (SYNTHROID, LEVOTHROID) 100 MCG tablet Take 50 mcg by mouth daily.       . Multiple Vitamins-Minerals (MULTIVITAMIN WITH MINERALS) tablet Take 1 tablet by mouth daily.           Objective: Vital signs in last 24 hours: BP 146/69  Pulse 56  Temp 97.6 F (36.4 C)  Ht 5' (1.524 m)  Wt 120 lb 8 oz (54.658 kg)  BMI 23.53 kg/m2   Physical Exam:    Sclerae unicteric  Oropharynx clear  No  peripheral adenopathy  Lungs clear -- no rales or rhonchi  Heart regular rate and rhythm  Abdomen benign  MSK kyphosis and scoliosis but no focal spinal tenderness  Neuro nonfocal  Breast exam: Right breast no suspicious findings left breast status post lumpectomy no evidence of local recurrence  Lab Results:   CMP  Lab Results  Component Value Date   GLUCOSE 84 11/18/2010   GLUCOSE 84 11/18/2010   CHOL 218* 10/21/2010   TRIG 133 10/21/2010   HDL 54 10/21/2010   LDLCALC 137* 10/21/2010   ALT 15 11/18/2010   ALT 15 11/18/2010   AST 27 11/18/2010   AST 27 11/18/2010   NA 142 11/18/2010   NA 142 11/18/2010   K 4.6 11/18/2010   K 4.6 11/18/2010   CL 108 11/18/2010   CL 108 11/18/2010   CREATININE 0.93 11/18/2010   CREATININE 0.93 11/18/2010   BUN 23 11/18/2010   BUN 23 11/18/2010   CO2 20 11/18/2010   CO2 20 11/18/2010   INR 1.01 10/22/2010   HGBA1C 5.6 10/21/2010     Lab Results  Component Value Date   WBC 7.1 11/18/2010   HGB 11.9 11/18/2010   HCT 35.0 11/18/2010   MCV 92.7 11/18/2010  PLT 285 11/18/2010        Studies/Results: Brain MRI and MRA 10/21/2010 showed no evidence of a stroke and certainly no suspicious findings for metastatic disease. MRI of the cervical spine July of 2012 showed only degenerative changes.  Assessment: 77 year old [place] woman status post left lumpectomy and sentinel lymph node dissection December of 2006 for an 8 mm invasive ductal carcinoma, grade 3, triple negative, with no lymph node involvement, and so stage I. She received radiation therapy completed in 2006.   Plan: She does not like the tramadol we have prescribed for her. We are stopping that and trying Up in 10 100 mg at bedtime to see if that helps her sleep better and perhaps a little bit with some of her arthritic pain. Otherwise I think she is doing remarkably well for her age and certainly gives me no symptoms suggestive of disease recurrence. She will see [place]  again in June after her May mammogram. She knows to call for problems that may develop before that.  Kylynn Street C 11/25/2010

## 2010-11-25 NOTE — Telephone Encounter (Signed)
GAVE PATIENT APPOINTMENT 06-2011

## 2011-04-22 DIAGNOSIS — H409 Unspecified glaucoma: Secondary | ICD-10-CM | POA: Diagnosis not present

## 2011-04-22 DIAGNOSIS — H4011X0 Primary open-angle glaucoma, stage unspecified: Secondary | ICD-10-CM | POA: Diagnosis not present

## 2011-05-26 DIAGNOSIS — Z853 Personal history of malignant neoplasm of breast: Secondary | ICD-10-CM | POA: Diagnosis not present

## 2011-06-17 ENCOUNTER — Other Ambulatory Visit: Payer: Self-pay | Admitting: *Deleted

## 2011-06-17 DIAGNOSIS — Z853 Personal history of malignant neoplasm of breast: Secondary | ICD-10-CM | POA: Insufficient documentation

## 2011-06-18 ENCOUNTER — Other Ambulatory Visit (HOSPITAL_BASED_OUTPATIENT_CLINIC_OR_DEPARTMENT_OTHER): Payer: Medicare Other | Admitting: Lab

## 2011-06-18 DIAGNOSIS — C50219 Malignant neoplasm of upper-inner quadrant of unspecified female breast: Secondary | ICD-10-CM | POA: Diagnosis not present

## 2011-06-18 DIAGNOSIS — Z853 Personal history of malignant neoplasm of breast: Secondary | ICD-10-CM

## 2011-06-18 LAB — COMPREHENSIVE METABOLIC PANEL
Albumin: 3.9 g/dL (ref 3.5–5.2)
BUN: 30 mg/dL — ABNORMAL HIGH (ref 6–23)
Calcium: 9 mg/dL (ref 8.4–10.5)
Chloride: 111 mEq/L (ref 96–112)
Glucose, Bld: 77 mg/dL (ref 70–99)
Potassium: 4.3 mEq/L (ref 3.5–5.3)

## 2011-06-18 LAB — CBC WITH DIFFERENTIAL/PLATELET
Basophils Absolute: 0.1 10*3/uL (ref 0.0–0.1)
Eosinophils Absolute: 0.4 10*3/uL (ref 0.0–0.5)
HGB: 11.2 g/dL — ABNORMAL LOW (ref 11.6–15.9)
MCV: 92.6 fL (ref 79.5–101.0)
NEUT#: 4 10*3/uL (ref 1.5–6.5)
RDW: 13.8 % (ref 11.2–14.5)
lymph#: 1.6 10*3/uL (ref 0.9–3.3)

## 2011-06-25 ENCOUNTER — Ambulatory Visit (HOSPITAL_BASED_OUTPATIENT_CLINIC_OR_DEPARTMENT_OTHER): Payer: Medicare Other | Admitting: Physician Assistant

## 2011-06-25 ENCOUNTER — Encounter: Payer: Self-pay | Admitting: Physician Assistant

## 2011-06-25 VITALS — BP 131/67 | HR 63 | Temp 97.8°F | Ht 60.0 in | Wt 108.9 lb

## 2011-06-25 DIAGNOSIS — Z853 Personal history of malignant neoplasm of breast: Secondary | ICD-10-CM | POA: Diagnosis not present

## 2011-06-25 DIAGNOSIS — D509 Iron deficiency anemia, unspecified: Secondary | ICD-10-CM

## 2011-06-25 DIAGNOSIS — D649 Anemia, unspecified: Secondary | ICD-10-CM

## 2011-06-25 NOTE — Progress Notes (Signed)
ID: Andrea Gilmore   DOB: 1925-06-13  MR#: [id number]  NWG#:[id number]  HISTORY OF PRESENT ILLNESS: Andrea Gilmore had a screening mammogram showing suspicious calcifications.  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    The patient saw Dr. Maryagnes Amos, had an MRI of the breast on 09/11/2004, and this showed essentially some patchy enhancement in the medial aspect of the left breast corresponding with the prior area of ductal carcinoma in situ.  There were no other lesions.    Accordingly, on 09/18/04, Dr. Maryagnes Amos proceeded to lumpectomy with sentinel lymph node excision.  The final report ([id number]), in addition to the ductal carcinoma in situ, showed a 0.8-cm area of invasive ductal carcinoma which was grade 3.  The sentinel lymph node was negative, and the estrogen, progesterone, and HercepTest were all negative on the invasive component of the tumor.  Patient received radiation therapy, completed in 2006, and has been followed since that time with observation alone.  INTERVAL HISTORY: Andrea Gilmore returns today for routine six-month followup of her left breast carcinoma. She tells me it has been "a rough time" for her, and she is slightly tearful on presentation today. She tells me her sister-in-law passed away in February 27, 2022 of this year; her daughter-in-law died in 2022/05/29, apparently with a cancer of unknown primary; and her cousin passed away just a few weeks ago. Understandably, this has been very emotional for [name], who actually seems to be grieving appropriately. She's doing a lot of help her son, Andrea Gilmore, since the death of his wife. Physically, she actually has few complaints.  REVIEW OF SYSTEMS: Andrea Gilmore has had no recent illnesses and denies any fevers, chills, or night sweats. She's had no rashes or skin changes. No abnormal bleeding. She sometimes has difficulty sleeping, but feels like her energy level is "pretty good". Her  appetite is reduced, but she denies any nausea. No change in bowel habits. No cough or increased shortness of breath. No chest pain or palpitations. No abnormal headaches. She occasionally has some lower back pain which is chronic, and denies any new pain elsewhere. She admits to feeling a little anxious and sad, but denies suicidal ideation.  A detailed review of systems is otherwise noncontributory.   PAST MEDICAL HISTORY: History reviewed. No pertinent past medical history. Significant for a remote history of peptic ulcer disease, history of osteopenia, history of hypothyroidism, history of right third digit trigger finger release, a history of arthroscopic right knee surgery, history of trauma to the right foot, history of lumbar laminectomy x 2 under Autumn Messing.  History of right breast biopsy x 2 and a left breast biopsy x 1 previously, all benign.  History of cholecystectomy, history of appendectomy, history of tonsillectomy and adenoidectomy, history of hysterectomy with bilateral salpingo-oophorectomy in 1975 for fibroids, and a history of bilateral rotator cuff surgery under Norlene Campbell.  PAST SURGICAL HISTORY: History reviewed. No pertinent past surgical history.  FAMILY HISTORY History reviewed. No pertinent family history. The patient's father died from lung cancer at the age of 3.  The patient's mother died from primary brain cancer at the age of 70.  The patient had a brother who died from unknown causes in his late 87s, and a half sister who is alive.  A full sister died at birth.  GYNECOLOGIC HISTORY: The patient is G2, P2, first pregnancy age 82, menarche age 11.  She never had problems with hot flashes after her hysterectomy and bilateral oophorectomy  in 1975 and never took hormone replacement therapy.  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  She is widowed.  Her husband died from lung  cancer in the setting of asbestos exposure. She lives by herself.  Her daughter, Andrea Gilmore, works for [employer] here in [place].  Her son, Andrea Gilmore, works for [employer] as an Biomedical scientist, also here in town.  The patient has 2 grandchildren and 2 great grandchildren.     ADVANCED DIRECTIVES:  HEALTH MAINTENANCE: History  Substance Use Topics  . Smoking status: Never Smoker   . Smokeless tobacco: Never Used  . Alcohol Use:      Colonoscopy:  PAP:  Bone density:  Lipid panel:  Allergies  Allergen Reactions  . Codeine Rash  . Red Dye Itching    Current Outpatient Prescriptions  Medication Sig Dispense Refill  . aspirin 325 MG buffered tablet Take 81 mg by mouth daily.       . bimatoprost (LUMIGAN) 0.01 % SOLN 1 drop at bedtime.        . calcium carbonate (OS-CAL) 600 MG TABS Take 600 mg by mouth 2 (two) times daily with a meal. With D      . diazepam (VALIUM) 5 MG tablet Take 5 mg by mouth every 6 (six) hours as needed.        Marland Kitchen levothyroxine (SYNTHROID, LEVOTHROID) 100 MCG tablet Take 50 mcg by mouth daily.       . Multiple Vitamins-Minerals (MULTIVITAMIN WITH MINERALS) tablet Take 1 tablet by mouth daily.        Marland Kitchen gabapentin (NEURONTIN) 100 MG capsule Take 100 mg by mouth at bedtime as needed.          OBJECTIVE: Elderly white female who appears somewhat anxious, but comfortable and in no acute distress. Filed Vitals:   06/25/11 1305  BP: 131/67  Pulse: 63  Temp: 97.8 F (36.6 C)     Body mass index is 21.27 kg/(m^2).    ECOG FS: 1  Filed Weights   06/25/11 1305  Weight: 108 lb 14.4 oz (49.397 kg)   Physical Exam: HEENT:  Sclerae anicteric, conjunctivae pink.  Oropharynx clear.   Nodes:  No cervical, supraclavicular, or axillary lymphadenopathy palpated.  Breast Exam:  Right breast is unremarkable with no masses, skin changes, or nipple inversion. Left breast is status post lumpectomy with no suspicious nodularities or skin changes. No evidence of local  recurrence.  Lungs:  Clear to auscultation bilaterally.  No crackles, rhonchi, or wheezes.   Heart:  Regular rate and rhythm.   Abdomen:  Soft, thin, nontender.  Positive bowel sounds.  No organomegaly or masses palpated.   Musculoskeletal:  No double kyphosis and scoliosis. No focal spinal tenderness to palpation.  Extremities:  Benign.  No peripheral edema or cyanosis.   Skin:  Benign.   Neuro:  Nonfocal. Alert and oriented x3.    LAB RESULTS: Lab Results  Component Value Date   WBC 6.8 06/18/2011   NEUTROABS 4.0 06/18/2011   HGB 11.2* 06/18/2011   HCT 34.1* 06/18/2011   MCV 92.6 06/18/2011   PLT 242 06/18/2011      Chemistry      Component Value Date/Time   NA 141 06/18/2011 1257   K 4.3 06/18/2011 1257   CL 111 06/18/2011 1257   CO2 19 06/18/2011 1257   BUN 30* 06/18/2011 1257   CREATININE 1.14* 06/18/2011 1257  Component Value Date/Time   CALCIUM 9.0 06/18/2011 1257   ALKPHOS 43 06/18/2011 1257   AST 29 06/18/2011 1257   ALT 16 06/18/2011 1257   BILITOT 0.5 06/18/2011 1257       Lab Results  Component Value Date   LABCA2 22 06/18/2011    STUDIES: Bilateral mammogram at [facility] on 05/26/2011 was unremarkable.   ASSESSMENT: 78 y.o. [place] woman   (1)  status post left lumpectomy and sentinel lymph node dissection in December 2006 for an 8-mm invasive ductal carcinoma grade 3 triple negative with no lymph node involvement.    (2)  Status post radiation therapy, completed December 2006.  Now on observation alone with no evidence of disease recurrence.    (3)  Also with a history of iron deficiency anemia, status post IV iron in October 2010.  Negative stool guaiac x3.    PLAN: With regards to her breast cancer, Stevana continues to do well, and there is no clinical evidence of disease recurrence. She'll continue to followup with her primary care physician, Dr. Clelia Croft, and will see [place] again for routine followup in 6 months, November 2013. At that time we will repeat  labs, including a ferritin level since her hemoglobin seems to be dropping slightly. I will also note that Kensley's BUN and creatinine were slightly elevated on her CMET last week.   I have faxed her recent lab results to Dr. Alver Fisher office since she scheduled to followup with Dr. Clelia Croft next week on June 26.  Tylena voices understanding and agreement with our plan. She knows to call with any changes or problems prior to her next scheduled appointment.    Tarvis Blossom    06/25/2011

## 2011-07-02 DIAGNOSIS — R634 Abnormal weight loss: Secondary | ICD-10-CM | POA: Diagnosis not present

## 2011-07-02 DIAGNOSIS — I10 Essential (primary) hypertension: Secondary | ICD-10-CM | POA: Diagnosis not present

## 2011-07-02 DIAGNOSIS — Z23 Encounter for immunization: Secondary | ICD-10-CM | POA: Diagnosis not present

## 2011-07-02 DIAGNOSIS — R252 Cramp and spasm: Secondary | ICD-10-CM | POA: Diagnosis not present

## 2011-07-02 DIAGNOSIS — E039 Hypothyroidism, unspecified: Secondary | ICD-10-CM | POA: Diagnosis not present

## 2011-07-02 DIAGNOSIS — F4321 Adjustment disorder with depressed mood: Secondary | ICD-10-CM | POA: Diagnosis not present

## 2011-07-02 DIAGNOSIS — M810 Age-related osteoporosis without current pathological fracture: Secondary | ICD-10-CM | POA: Diagnosis not present

## 2011-07-02 DIAGNOSIS — G4700 Insomnia, unspecified: Secondary | ICD-10-CM | POA: Diagnosis not present

## 2011-08-20 ENCOUNTER — Telehealth: Payer: Self-pay | Admitting: *Deleted

## 2011-08-20 ENCOUNTER — Other Ambulatory Visit: Payer: Self-pay | Admitting: Medical Oncology

## 2011-08-20 DIAGNOSIS — H4011X0 Primary open-angle glaucoma, stage unspecified: Secondary | ICD-10-CM | POA: Diagnosis not present

## 2011-08-20 DIAGNOSIS — Z853 Personal history of malignant neoplasm of breast: Secondary | ICD-10-CM

## 2011-08-20 DIAGNOSIS — D649 Anemia, unspecified: Secondary | ICD-10-CM

## 2011-08-20 DIAGNOSIS — H409 Unspecified glaucoma: Secondary | ICD-10-CM | POA: Diagnosis not present

## 2011-08-20 NOTE — Telephone Encounter (Signed)
left voice message to inform the patient of the new date and time with labs being one week before the midlevel appointment

## 2011-08-22 ENCOUNTER — Other Ambulatory Visit: Payer: Medicare Other | Admitting: Lab

## 2011-08-26 ENCOUNTER — Telehealth: Payer: Self-pay | Admitting: *Deleted

## 2011-08-26 NOTE — Telephone Encounter (Signed)
Pt c/o extreme fatigue. Pt had exisiting appt with AB 08/29/11. Pt also reports that she is taking vallium with melatonin at bedtime for sleep. This desk nurse suggested that she d/c that regimen and discuss alternate sleep methods with AB on 08/29/11

## 2011-08-29 ENCOUNTER — Ambulatory Visit (HOSPITAL_BASED_OUTPATIENT_CLINIC_OR_DEPARTMENT_OTHER): Payer: Medicare Other | Admitting: Physician Assistant

## 2011-08-29 ENCOUNTER — Telehealth: Payer: Self-pay | Admitting: *Deleted

## 2011-08-29 ENCOUNTER — Ambulatory Visit (HOSPITAL_BASED_OUTPATIENT_CLINIC_OR_DEPARTMENT_OTHER): Payer: Medicare Other | Admitting: Lab

## 2011-08-29 ENCOUNTER — Encounter: Payer: Self-pay | Admitting: Physician Assistant

## 2011-08-29 VITALS — BP 136/76 | HR 56 | Temp 97.7°F | Resp 20 | Ht 60.0 in | Wt 113.8 lb

## 2011-08-29 DIAGNOSIS — Z853 Personal history of malignant neoplasm of breast: Secondary | ICD-10-CM | POA: Diagnosis not present

## 2011-08-29 DIAGNOSIS — Z862 Personal history of diseases of the blood and blood-forming organs and certain disorders involving the immune mechanism: Secondary | ICD-10-CM

## 2011-08-29 DIAGNOSIS — D539 Nutritional anemia, unspecified: Secondary | ICD-10-CM

## 2011-08-29 DIAGNOSIS — D649 Anemia, unspecified: Secondary | ICD-10-CM

## 2011-08-29 DIAGNOSIS — R5383 Other fatigue: Secondary | ICD-10-CM | POA: Diagnosis not present

## 2011-08-29 DIAGNOSIS — E611 Iron deficiency: Secondary | ICD-10-CM

## 2011-08-29 DIAGNOSIS — R5381 Other malaise: Secondary | ICD-10-CM

## 2011-08-29 DIAGNOSIS — Z8639 Personal history of other endocrine, nutritional and metabolic disease: Secondary | ICD-10-CM

## 2011-08-29 LAB — COMPREHENSIVE METABOLIC PANEL
AST: 24 U/L (ref 0–37)
BUN: 24 mg/dL — ABNORMAL HIGH (ref 6–23)
Calcium: 9.1 mg/dL (ref 8.4–10.5)
Chloride: 109 mEq/L (ref 96–112)
Creatinine, Ser: 0.9 mg/dL (ref 0.50–1.10)

## 2011-08-29 LAB — CBC & DIFF AND RETIC
Basophils Absolute: 0.1 10*3/uL (ref 0.0–0.1)
Eosinophils Absolute: 1 10*3/uL — ABNORMAL HIGH (ref 0.0–0.5)
HGB: 11.5 g/dL — ABNORMAL LOW (ref 11.6–15.9)
MCV: 92.4 fL (ref 79.5–101.0)
MONO%: 9.1 % (ref 0.0–14.0)
NEUT#: 3.5 10*3/uL (ref 1.5–6.5)
RBC: 3.68 10*6/uL — ABNORMAL LOW (ref 3.70–5.45)
RDW: 14 % (ref 11.2–14.5)
WBC: 7 10*3/uL (ref 3.9–10.3)
lymph#: 1.9 10*3/uL (ref 0.9–3.3)

## 2011-08-29 LAB — FOLATE: Folate: >20 ng/mL

## 2011-08-29 NOTE — Progress Notes (Signed)
ID: Andrea Gilmore   DOB: [birth year]  MR#: [id number]  WUJ#:[id number]  HISTORY OF PRESENT ILLNESS: Andrea Gilmore had a screening mammogram showing suspicious calcifications.  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    The patient saw Dr. Maryagnes Gilmore, had an MRI of the breast on 09/11/2004, and this showed essentially some patchy enhancement in the medial aspect of the left breast corresponding with the prior area of ductal carcinoma in situ.  There were no other lesions.    Accordingly, on 09/18/04, Dr. Maryagnes Gilmore proceeded to lumpectomy with sentinel lymph node excision.  The final report ([id number]), in addition to the ductal carcinoma in situ, showed a 0.8-cm area of invasive ductal carcinoma which was grade 3.  The sentinel lymph node was negative, and the estrogen, progesterone, and HercepTest were all negative on the invasive component of the tumor.  Patient received radiation therapy, completed in 2006, and has been followed since that time with observation alone.  INTERVAL HISTORY: Andrea Gilmore returns today in between routine follow up appointments.  She is follwed here for her history of left breast cancer, but also has a history of anemia with iron deficiency, and was given IV iron in 2010 through this office.  Andrea Gilmore contacted [place] last week with complaints of increased fatigue and weakness. She says it has been a difficult summer, and she "hasn't felt like doing anything" due to the fatigue. She is here today for further evaluation. She continues to be followed regularly by Dr. [name] as well.   REVIEW OF SYSTEMS: Andrea Gilmore has had no recent illnesses and denies any fevers, chills, or night sweats. She's had no rashes or skin changes. No abnormal bleeding. She tells me she is up-to-date with a screening colonoscopy, approximately 3-4 years ago. She sometimes has difficulty sleeping. She often has cramps in her legs, and  sometimes in her hands, especially at night. Her appetite is reduced, but she denies any nausea. No change in bowel habits. No cough or phlegm production. She's noticed an increase in shortness of breath with exertion. No chest pain or palpitations. No abnormal headaches. She occasionally has some lower back pain which is chronic, and denies any new pain elsewhere.  A detailed review of systems is otherwise stable and noncontributory.   PAST MEDICAL HISTORY: History reviewed. No pertinent past medical history. Significant for a remote history of peptic ulcer disease, history of osteopenia, history of hypothyroidism, history of right third digit trigger finger release, a history of arthroscopic right knee surgery, history of trauma to the right foot, history of lumbar laminectomy x 2 under Andrea Gilmore.  History of right breast biopsy x 2 and a left breast biopsy x 1 previously, all benign.  History of cholecystectomy, history of appendectomy, history of tonsillectomy and adenoidectomy, history of hysterectomy with bilateral salpingo-oophorectomy in 1975 for fibroids, and a history of bilateral rotator cuff surgery under Andrea Gilmore.  PAST SURGICAL HISTORY: History reviewed. No pertinent past surgical history.  FAMILY HISTORY History reviewed. No pertinent family history. The patient's father died from lung cancer at the age of 55.  The patient's mother died from primary brain cancer at the age of 62.  The patient had a brother who died from unknown causes in his late 49s, and a half sister who is alive.  A full sister died at birth.  GYNECOLOGIC HISTORY: The patient is G2, P2, first pregnancy age 45, menarche age 89.  She never had problems  with hot flashes after her hysterectomy and bilateral oophorectomy in 1975 and never took hormone replacement therapy.  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  She is  widowed.  Her husband died from lung cancer in the setting of asbestos exposure. She lives by herself.  Her daughter, Andrea Gilmore, works for [employer] here in [place].  Her son, Andrea Gilmore, works for [employer] as an Biomedical scientist, also here in town.  The patient has 2 grandchildren and 2 great grandchildren.     ADVANCED DIRECTIVES:  HEALTH MAINTENANCE: History  Substance Use Topics  . Smoking status: Never Smoker   . Smokeless tobacco: Never Used  . Alcohol Use: No     Colonoscopy: approx 2010   PAP:  Bone density: UTD, on Reclast through Dr. [name]  Lipid panel: UTD, Dr. [name]  Allergies  Allergen Reactions  . Codeine Rash  . Red Dye Itching    Current Outpatient Prescriptions  Medication Sig Dispense Refill  . aspirin 325 MG buffered tablet Take 325 mg by mouth daily.       . bimatoprost (LUMIGAN) 0.01 % SOLN 1 drop at bedtime.        . calcium carbonate (OS-CAL) 600 MG TABS Take 600 mg by mouth 2 (two) times daily with a meal. With D      . co-enzyme Q-10 30 MG capsule Take 30 mg by mouth daily.      . diazepam (VALIUM) 5 MG tablet Take 5 mg by mouth every 6 (six) hours as needed.       . fish oil-omega-3 fatty acids 1000 MG capsule Take 1 g by mouth daily.      Marland Kitchen levothyroxine (SYNTHROID, LEVOTHROID) 100 MCG tablet Take 50 mcg by mouth daily.       . Multiple Vitamins-Minerals (MULTIVITAMIN WITH MINERALS) tablet Take 1 tablet by mouth daily.        Marland Kitchen gabapentin (NEURONTIN) 100 MG capsule Take 100 mg by mouth at bedtime as needed.          OBJECTIVE: Elderly white female who appears somewhat anxious, but comfortable and in no acute distress. Filed Vitals:   08/29/11 0930  BP: 136/76  Pulse: 56  Temp: 97.7 F (36.5 C)  Resp: 20     Body mass index is 22.22 kg/(m^2).    ECOG FS: 1  Filed Weights   08/29/11 0930  Weight: 113 lb 12.8 oz (51.619 kg)   Physical Exam: HEENT:  Sclerae anicteric, conjunctivae pink.  Oropharynx benign.   Nodes:  No cervical or  supraclavicular lymphadenopathy palpated.  Breast Exam: Deferred. Lungs:  Clear to auscultation bilaterally.  Heart:  Regular rate and rhythm.   Abdomen:  Benign.  Positive bowel sounds.  Musculoskeletal:  Notable kyphosis and scoliosis. Extremities:  Benign.    Neuro:  Nonfocal. Alert and oriented x3.    LAB RESULTS: Lab Results  Component Value Date   WBC 7.0 08/29/2011   NEUTROABS 3.5 08/29/2011   HGB 11.5* 08/29/2011   HCT 34.0* 08/29/2011   MCV 92.4 08/29/2011   PLT 210 08/29/2011      Chemistry      Component Value Date/Time   NA 141 06/18/2011 1257   K 4.3 06/18/2011 1257   CL 111 06/18/2011 1257   CO2 19 06/18/2011 1257   BUN 30* 06/18/2011 1257   CREATININE 1.14* 06/18/2011 1257      Component Value Date/Time  CALCIUM 9.0 06/18/2011 1257   ALKPHOS 43 06/18/2011 1257   AST 29 06/18/2011 1257   ALT 16 06/18/2011 1257   BILITOT 0.5 06/18/2011 1257       Lab Results  Component Value Date   LABCA2 22 06/18/2011   CMET, Ferritin, Folate, B-12, and Retic Count have also been ordered today, with results pending.  STUDIES: Bilateral mammogram [facility] on [facility]/26[facility]2011 was unremarkable.   ASSESSMENT: 78 y.o. [place] woman   (1)  status post left lumpectomy and sentinel lymph node dissection in December 2006 for an 8-mm invasive ductal carcinoma grade 3 triple negative with no lymph node involvement.    (2)  Status post radiation therapy, completed December 2006.  Now on observation alone with no evidence of disease recurrence.    (3)  Also with a history of iron deficiency anemia, status post IV iron in October 2010.  Negative stool guaiac x3.    PLAN: We are repeating several labs today, including a ferritin, folate, and B12. I will also take a look at her kidney function as well as her electrolytes, and we will fax a copy of those results to Dr. [name] as well prior to [place] receiving her next dose of Reclast through her office.  I will see her again next week to review  these lab results, and if necessary we will proceed with a fereheme injection.   Andrea Gilmore voices understanding and agreement with our plan. She knows to call with any changes or problems prior to her next scheduled appointment.    Andrea Gilmore    08/29/2011

## 2011-08-29 NOTE — Telephone Encounter (Signed)
Gave patient appointment for 09-04-2011 sent michelle email to set up patients treatment on 09-04-2011

## 2011-09-01 ENCOUNTER — Telehealth: Payer: Self-pay | Admitting: *Deleted

## 2011-09-01 NOTE — Telephone Encounter (Signed)
Per staff message and POF I have scheduled appt.  JMW  

## 2011-09-04 ENCOUNTER — Encounter: Payer: Self-pay | Admitting: Physician Assistant

## 2011-09-04 ENCOUNTER — Ambulatory Visit (HOSPITAL_BASED_OUTPATIENT_CLINIC_OR_DEPARTMENT_OTHER): Payer: Medicare Other

## 2011-09-04 ENCOUNTER — Ambulatory Visit (HOSPITAL_BASED_OUTPATIENT_CLINIC_OR_DEPARTMENT_OTHER): Payer: Medicare Other | Admitting: Physician Assistant

## 2011-09-04 VITALS — BP 129/67 | HR 57 | Temp 98.6°F | Resp 20 | Ht 60.0 in | Wt 113.5 lb

## 2011-09-04 VITALS — BP 127/61 | HR 52 | Resp 18

## 2011-09-04 DIAGNOSIS — D509 Iron deficiency anemia, unspecified: Secondary | ICD-10-CM | POA: Diagnosis not present

## 2011-09-04 DIAGNOSIS — E611 Iron deficiency: Secondary | ICD-10-CM

## 2011-09-04 DIAGNOSIS — Z853 Personal history of malignant neoplasm of breast: Secondary | ICD-10-CM | POA: Diagnosis not present

## 2011-09-04 DIAGNOSIS — D649 Anemia, unspecified: Secondary | ICD-10-CM

## 2011-09-04 MED ORDER — SODIUM CHLORIDE 0.9 % IV SOLN
Freq: Once | INTRAVENOUS | Status: AC
  Administered 2011-09-04: 15:00:00 via INTRAVENOUS

## 2011-09-04 MED ORDER — FERUMOXYTOL INJECTION 510 MG/17 ML
510.0000 mg | Freq: Once | INTRAVENOUS | Status: AC
  Administered 2011-09-04: 510 mg via INTRAVENOUS
  Filled 2011-09-04: qty 17

## 2011-09-04 NOTE — Progress Notes (Signed)
ID: Andrea Gilmore   DOB: [birth year]  MR#: [id number]  AOZ#:[id number]  HISTORY OF PRESENT ILLNESS: Andrea Gilmore had a screening mammogram showing suspicious calcifications.  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    The patient saw Dr. Maryagnes Amos, had an MRI of the breast on 09/11/2004, and this showed essentially some patchy enhancement in the medial aspect of the left breast corresponding with the prior area of ductal carcinoma in situ.  There were no other lesions.    Accordingly, on 09/18/04, Dr. Maryagnes Amos proceeded to lumpectomy with sentinel lymph node excision.  The final report ([id number]), in addition to the ductal carcinoma in situ, showed a 0.8-cm area of invasive ductal carcinoma which was grade 3.  The sentinel lymph node was negative, and the estrogen, progesterone, and HercepTest were all negative on the invasive component of the tumor.  Patient received radiation therapy, completed in 2006, and has been followed since that time with observation alone.  INTERVAL HISTORY: Andrea Gilmore returns today for followup of her fatigue associated with anemia. Her iron has decreased down to 39, down from 122 in May of 2012. She continues to feel extremely fatigued and weak.   REVIEW OF SYSTEMS: Andrea Gilmore has had no recent illnesses and denies any fevers, chills, or night sweats. She's had no rashes or skin changes. She continues to deny any signs of abnormal bleeding. As noted last week, she is up-to-date with a screening colonoscopy, approximately 3-4 years ago. She denies any nausea and has had no change in bowel habits. No cough or phlegm production. She's noticed an increase in shortness of breath with exertion. No chest pain or palpitations. No abnormal headaches. She occasionally has some lower back pain which is chronic, and denies any new pain elsewhere.  A detailed review of systems is otherwise stable and  noncontributory.   PAST MEDICAL HISTORY: No past medical history on file. Significant for a remote history of peptic ulcer disease, history of osteopenia, history of hypothyroidism, history of right third digit trigger finger release, a history of arthroscopic right knee surgery, history of trauma to the right foot, history of lumbar laminectomy x 2 under Autumn Messing.  History of right breast biopsy x 2 and a left breast biopsy x 1 previously, all benign.  History of cholecystectomy, history of appendectomy, history of tonsillectomy and adenoidectomy, history of hysterectomy with bilateral salpingo-oophorectomy in 1975 for fibroids, and a history of bilateral rotator cuff surgery under Norlene Campbell.  PAST SURGICAL HISTORY: No past surgical history on file.  FAMILY HISTORY No family history on file. The patient's father died from lung cancer at the age of 25.  The patient's mother died from primary brain cancer at the age of 25.  The patient had a brother who died from unknown causes in his late 18s, and a half sister who is alive.  A full sister died at birth.  GYNECOLOGIC HISTORY: The patient is G2, P2, first pregnancy age 83, menarche age 72.  She never had problems with hot flashes after her hysterectomy and bilateral oophorectomy in 1975 and never took hormone replacement therapy.  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  She is widowed.  Her husband died from lung cancer in the setting of asbestos exposure. She lives by herself.  Her daughter, [name], works for [employer] here in [place].  Her son, [name], works for  [employer] as an Biomedical scientist, also here in town.  The patient has 2 grandchildren and 2 great grandchildren.     ADVANCED DIRECTIVES:  HEALTH MAINTENANCE: History  Substance Use Topics  . Smoking status: Never Smoker   . Smokeless tobacco: Never Used  . Alcohol Use: No      Colonoscopy: approx 2010   PAP:  Bone density: UTD, on Reclast through Dr. Clelia Croft  Lipid panel: UTD, Dr. Clelia Croft  Allergies  Allergen Reactions  . Codeine Rash  . Red Dye Itching    Current Outpatient Prescriptions  Medication Sig Dispense Refill  . aspirin 325 MG buffered tablet Take 325 mg by mouth daily.       . bimatoprost (LUMIGAN) 0.01 % SOLN 1 drop at bedtime.        . calcium carbonate (OS-CAL) 600 MG TABS Take 600 mg by mouth 2 (two) times daily with a meal. With D      . co-enzyme Q-10 30 MG capsule Take 30 mg by mouth daily.      . diazepam (VALIUM) 5 MG tablet Take 5 mg by mouth every 6 (six) hours as needed.       . fish oil-omega-3 fatty acids 1000 MG capsule Take 1 g by mouth daily.      Marland Kitchen gabapentin (NEURONTIN) 100 MG capsule Take 100 mg by mouth at bedtime as needed.        Marland Kitchen levothyroxine (SYNTHROID, LEVOTHROID) 100 MCG tablet Take 50 mcg by mouth daily.       . Multiple Vitamins-Minerals (MULTIVITAMIN WITH MINERALS) tablet Take 1 tablet by mouth daily.          OBJECTIVE: Elderly white female who appears somewhat anxious, but comfortable and in no acute distress. Filed Vitals:   09/04/11 1346  BP: 129/67  Pulse: 57  Temp: 98.6 F (37 C)  Resp: 20     Body mass index is 22.17 kg/(m^2).    ECOG FS: 1  Filed Weights   09/04/11 1346  Weight: 113 lb 8 oz (51.483 kg)   Physical Exam: Remainder of physical exam was deferred today.   LAB RESULTS: Lab Results  Component Value Date   WBC 7.0 08/29/2011   NEUTROABS 3.5 08/29/2011   HGB 11.5* 08/29/2011   HCT 34.0* 08/29/2011   MCV 92.4 08/29/2011   PLT 210 08/29/2011      Chemistry      Component Value Date/Time   NA 142 08/29/2011 1020   K 4.2 08/29/2011 1020   CL 109 08/29/2011 1020   CO2 24 08/29/2011 1020   BUN 24* 08/29/2011 1020   CREATININE 0.90 08/29/2011 1020      Component Value Date/Time   CALCIUM 9.1 08/29/2011 1020   ALKPHOS 40 08/29/2011 1020   AST 24 08/29/2011 1020   ALT 13 08/29/2011 1020    BILITOT 0.4 08/29/2011 1020       Lab Results  Component Value Date   LABCA2 22 06/18/2011   Ferritin = 39 , Folate >20.0, and B-12 = 1168  On 08/29/2011.  STUDIES: Bilateral mammogram at [facility] on 05/26/2011 was unremarkable.   ASSESSMENT: 78 y.o. Andrea Gilmore woman   (1)  status post left lumpectomy and sentinel lymph node dissection in December 2006 for an 8-mm invasive ductal carcinoma grade 3 triple negative with no lymph node involvement.    (2)  Status post radiation therapy, completed December 2006.  Now on observation alone with no evidence of disease recurrence.    (  3)  Also with a history of iron deficiency anemia, status post IV iron in October 2010.  Negative stool guaiac x3.    PLAN: I have reviewed this case and the labs with Dr. Darnelle Catalan, and we will proceed with a dose of Feraheme today as planned.  Over half of our 25 minute appointment today was spent reviewing our treatment plan, and coordinating care with the patient. Jamyia will return for repeat labs including a CBC and ferritin level in approximately 4 weeks, after which I will see her for reevaluation. She is unable to tolerate oral iron preparations, so I urged her to increase iron in her diet.   Tysheka voices understanding and agreement with our plan. She knows to call with any changes or problems prior to her next scheduled appointment.    Yerick Eggebrecht    09/04/2011

## 2011-09-04 NOTE — Patient Instructions (Addendum)
Ferumoxytol injection (Feraheme) What is this medicine? FERUMOXYTOL is an iron complex. Iron is used to make healthy red blood cells, which carry oxygen and nutrients throughout the body. This medicine is used to treat iron deficiency anemia in people with chronic kidney disease. This medicine may be used for other purposes; ask your health care provider or pharmacist if you have questions. What should I tell my health care provider before I take this medicine? They need to know if you have any of these conditions: -anemia not caused by low iron levels -high levels of iron in the blood -magnetic resonance imaging (MRI) test scheduled -an unusual or allergic reaction to iron, other medicines, foods, dyes, or preservatives -pregnant or trying to get pregnant -breast-feeding How should I use this medicine? This medicine is for infusion into a vein. It is given by a health care professional in a hospital or clinic setting. Talk to your pediatrician regarding the use of this medicine in children. Special care may be needed. Overdosage: If you think you've taken too much of this medicine contact a poison control center or emergency room at once. Overdosage: If you think you have taken too much of this medicine contact a poison control center or emergency room at once. NOTE: This medicine is only for you. Do not share this medicine with others. What if I miss a dose? It is important not to miss your dose. Call your doctor or health care professional if you are unable to keep an appointment. What may interact with this medicine? This medicine may interact with the following medications: -other iron products This list may not describe all possible interactions. Give your health care provider a list of all the medicines, herbs, non-prescription drugs, or dietary supplements you use. Also tell them if you smoke, drink alcohol, or use illegal drugs. Some items may interact with your medicine. What should  I watch for while using this medicine? Visit your doctor or healthcare professional regularly. Tell your doctor or healthcare professional if your symptoms do not start to get better or if they get worse. You may need blood work done while you are taking this medicine. You may need to follow a special diet. Talk to your doctor. Foods that contain iron include: whole grains/cereals, dried fruits, beans, or peas, leafy green vegetables, and organ meats (liver, kidney). What side effects may I notice from receiving this medicine? Side effects that you should report to your doctor or health care professional as soon as possible: -allergic reactions like skin rash, itching or hives, swelling of the face, lips, or tongue -breathing problems -changes in blood pressure -feeling faint or lightheaded, falls -fever or chills -flushing, sweating, or hot feelings -swelling of the ankles or feet Side effects that usually do not require medical attention (Report these to your doctor or health care professional if they continue or are bothersome.): -diarrhea -headache -nausea, vomiting -stomach pain This list may not describe all possible side effects. Call your doctor for medical advice about side effects. You may report side effects to FDA at 1-800-FDA-1088. Where should I keep my medicine? This drug is given in a hospital or clinic and will not be stored at home. NOTE: This sheet is a summary. It may not cover all possible information. If you have questions about this medicine, talk to your doctor, pharmacist, or health care provider.  2012, [employer]vier/Gold Standard. (09/15/2007 9:48:25 PM) 

## 2011-09-05 ENCOUNTER — Telehealth: Payer: Self-pay | Admitting: *Deleted

## 2011-09-05 NOTE — Telephone Encounter (Signed)
Gave patient appointment for 09-30-2011 lab only 10-07-2011  At 2:15pm midlevel printed out calendar and gave to the patient

## 2011-09-29 DIAGNOSIS — M810 Age-related osteoporosis without current pathological fracture: Secondary | ICD-10-CM | POA: Diagnosis not present

## 2011-09-30 ENCOUNTER — Other Ambulatory Visit (HOSPITAL_BASED_OUTPATIENT_CLINIC_OR_DEPARTMENT_OTHER): Payer: Medicare Other | Admitting: Lab

## 2011-09-30 DIAGNOSIS — Z853 Personal history of malignant neoplasm of breast: Secondary | ICD-10-CM | POA: Diagnosis not present

## 2011-09-30 DIAGNOSIS — C50219 Malignant neoplasm of upper-inner quadrant of unspecified female breast: Secondary | ICD-10-CM

## 2011-09-30 DIAGNOSIS — D649 Anemia, unspecified: Secondary | ICD-10-CM

## 2011-09-30 DIAGNOSIS — E611 Iron deficiency: Secondary | ICD-10-CM

## 2011-09-30 DIAGNOSIS — D509 Iron deficiency anemia, unspecified: Secondary | ICD-10-CM

## 2011-09-30 LAB — CBC & DIFF AND RETIC
Basophils Absolute: 0 10*3/uL (ref 0.0–0.1)
Eosinophils Absolute: 0.4 10*3/uL (ref 0.0–0.5)
HGB: 11.7 g/dL (ref 11.6–15.9)
LYMPH%: 24.1 % (ref 14.0–49.7)
MCV: 89.9 fL (ref 79.5–101.0)
MONO#: 0.5 10*3/uL (ref 0.1–0.9)
NEUT#: 4.3 10*3/uL (ref 1.5–6.5)
Platelets: 283 10*3/uL (ref 145–400)
RBC: 3.87 10*6/uL (ref 3.70–5.45)
RDW: 14.6 % — ABNORMAL HIGH (ref 11.2–14.5)
Retic %: 1.47 % (ref 0.70–2.10)
Retic Ct Abs: 56.89 10*3/uL (ref 33.70–90.70)
WBC: 6.9 10*3/uL (ref 3.9–10.3)

## 2011-09-30 LAB — FERRITIN: Ferritin: 245 ng/mL (ref 10–291)

## 2011-10-07 ENCOUNTER — Other Ambulatory Visit: Payer: Medicare Other | Admitting: Lab

## 2011-10-07 ENCOUNTER — Telehealth: Payer: Self-pay | Admitting: Oncology

## 2011-10-07 ENCOUNTER — Ambulatory Visit (HOSPITAL_BASED_OUTPATIENT_CLINIC_OR_DEPARTMENT_OTHER): Payer: Medicare Other | Admitting: Physician Assistant

## 2011-10-07 ENCOUNTER — Encounter: Payer: Self-pay | Admitting: Physician Assistant

## 2011-10-07 VITALS — BP 131/60 | HR 54 | Temp 97.8°F | Resp 20 | Ht 60.0 in | Wt 114.0 lb

## 2011-10-07 DIAGNOSIS — Z853 Personal history of malignant neoplasm of breast: Secondary | ICD-10-CM

## 2011-10-07 DIAGNOSIS — R5383 Other fatigue: Secondary | ICD-10-CM

## 2011-10-07 DIAGNOSIS — D509 Iron deficiency anemia, unspecified: Secondary | ICD-10-CM | POA: Diagnosis not present

## 2011-10-07 DIAGNOSIS — R5381 Other malaise: Secondary | ICD-10-CM | POA: Diagnosis not present

## 2011-10-07 DIAGNOSIS — E611 Iron deficiency: Secondary | ICD-10-CM

## 2011-10-07 DIAGNOSIS — D649 Anemia, unspecified: Secondary | ICD-10-CM

## 2011-10-07 NOTE — Patient Instructions (Signed)
Repeat labs and visit in 6 months

## 2011-10-07 NOTE — Progress Notes (Signed)
ID: Andrea Gilmore   DOB: 1925/12/25  MR#: [id number]  NWG#:[id number]  HISTORY OF PRESENT ILLNESS: Demitra had a screening mammogram showing suspicious calcifications.  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    The patient saw Dr. Maryagnes Amos, had an MRI of the breast on 09/11/2004, and this showed essentially some patchy enhancement in the medial aspect of the left breast corresponding with the prior area of ductal carcinoma in situ.  There were no other lesions.    Accordingly, on 09/18/04, Dr. Maryagnes Amos proceeded to lumpectomy with sentinel lymph node excision.  The final report ([id number]), in addition to the ductal carcinoma in situ, showed a 0.8-cm area of invasive ductal carcinoma which was grade 3.  The sentinel lymph node was negative, and the estrogen, progesterone, and HercepTest were all negative on the invasive component of the tumor.  Patient received radiation therapy, completed in 2006, and has been followed since that time with observation alone.  INTERVAL HISTORY: Maili returns today for followup of her fatigue associated with iron deficiency anemia. She is status post infusion with Feraheme on 09/04/2011.  Since that time, she is feeling better. Although she is still not at 100%, her energy level has improved significantly.  REVIEW OF SYSTEMS: Zarie has had no recent illnesses and denies any fevers, chills, or night sweats. She's had no rashes or skin changes. She continues to deny any signs of abnormal bleeding. She denies any nausea and has had no change in bowel habits. No cough or phlegm production, but she continues to have some minor shortness of breath with exertion.  No chest pain or palpitations. No abnormal headaches. She occasionally has some lower back pain which is chronic, and denies any new pain elsewhere.  A detailed review of systems is otherwise stable and noncontributory.   PAST  MEDICAL HISTORY: No past medical history on file. Significant for a remote history of peptic ulcer disease, history of osteopenia, history of hypothyroidism, history of right third digit trigger finger release, a history of arthroscopic right knee surgery, history of trauma to the right foot, history of lumbar laminectomy x 2 under Autumn Messing.  History of right breast biopsy x 2 and a left breast biopsy x 1 previously, all benign.  History of cholecystectomy, history of appendectomy, history of tonsillectomy and adenoidectomy, history of hysterectomy with bilateral salpingo-oophorectomy in 1975 for fibroids, and a history of bilateral rotator cuff surgery under Norlene Campbell.  PAST SURGICAL HISTORY: No past surgical history on file.  FAMILY HISTORY No family history on file. The patient's father died from lung cancer at the age of 70.  The patient's mother died from primary brain cancer at the age of 41.  The patient had a brother who died from unknown causes in his late 68s, and a half sister who is alive.  A full sister died at birth.  GYNECOLOGIC HISTORY: The patient is G2, P2, first pregnancy age 76, menarche age 45.  She never had problems with hot flashes after her hysterectomy and bilateral oophorectomy in 1975 and never took hormone replacement therapy.  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  She is widowed.  Her husband died from lung cancer in the setting of asbestos exposure. She lives by herself.  Her daughter, [name], works for [employer] here in [place].  Her son, [name], works for [employer] as  an Biomedical scientist, also here in town.  The patient has 2 grandchildren and 2 great grandchildren.     ADVANCED DIRECTIVES:  HEALTH MAINTENANCE: History  Substance Use Topics  . Smoking status: Never Smoker   . Smokeless tobacco: Never Used  . Alcohol Use: No     Colonoscopy: approx  2010   PAP:  Bone density: UTD, on Reclast through Dr. Clelia Croft  Lipid panel: UTD, Dr. Clelia Croft  Allergies  Allergen Reactions  . Morphine And Related Rash  . Codeine Rash  . Blue Dyes (Parenteral) Itching  . Red Dye Itching    Current Outpatient Prescriptions  Medication Sig Dispense Refill  . aspirin 325 MG buffered tablet Take 325 mg by mouth daily.       . bimatoprost (LUMIGAN) 0.01 % SOLN 1 drop at bedtime.        . calcium carbonate (OS-CAL) 600 MG TABS Take 600 mg by mouth 2 (two) times daily with a meal. With D      . co-enzyme Q-10 30 MG capsule Take 30 mg by mouth daily.      . diazepam (VALIUM) 5 MG tablet Take 5 mg by mouth every 6 (six) hours as needed.       . fish oil-omega-3 fatty acids 1000 MG capsule Take 1 g by mouth daily.      Marland Kitchen gabapentin (NEURONTIN) 100 MG capsule Take 100 mg by mouth at bedtime as needed.        Marland Kitchen levothyroxine (SYNTHROID, LEVOTHROID) 100 MCG tablet Take 50 mcg by mouth daily.       . Multiple Vitamins-Minerals (MULTIVITAMIN WITH MINERALS) tablet Take 1 tablet by mouth daily.          OBJECTIVE: Elderly white female who appears comfortable and in no acute distress. Filed Vitals:   10/07/11 1412  BP: 131/60  Pulse: 54  Temp: 97.8 F (36.6 C)  Resp: 20     Body mass index is 22.26 kg/(m^2).    ECOG FS: 1  Filed Weights   10/07/11 1412  Weight: 114 lb (51.71 kg)  Physical Exam: HEENT:  Sclerae anicteric.  Oropharynx clear.  Nodes:  No cervical or supraclavicular lymphadenopathy palpated.  Breast Exam:  Deferred Lungs:  Clear to auscultation bilaterally.   Heart:  Regular rate and rhythm.   Abdomen:  Soft, nontender.  Positive bowel sounds.  Musculoskeletal:  No focal spinal tenderness to palpation.  Extremities:  Benign.   Neuro:  Nonfocal, alert and oriented x3     LAB RESULTS: Lab Results  Component Value Date   WBC 6.9 09/30/2011   NEUTROABS 4.3 09/30/2011   HGB 11.7 09/30/2011   HCT 34.8 09/30/2011   MCV 89.9 09/30/2011   PLT  283 09/30/2011      Chemistry      Component Value Date/Time   NA 142 08/29/2011 1020   K 4.2 08/29/2011 1020   CL 109 08/29/2011 1020   CO2 24 08/29/2011 1020   BUN 24* 08/29/2011 1020   CREATININE 0.90 08/29/2011 1020      Component Value Date/Time   CALCIUM 9.1 08/29/2011 1020   ALKPHOS 40 08/29/2011 1020   AST 24 08/29/2011 1020   ALT 13 08/29/2011 1020   BILITOT 0.4 08/29/2011 1020       Lab Results  Component Value Date   LABCA2 22 06/18/2011   Ferritin = 39 , Folate >20.0, and B-12 = 1168  On 08/29/2011.  Ferritin = 245 on 09/30/2011  STUDIES: Bilateral  mammogram at [facility] on 05/26/2011 was unremarkable.   ASSESSMENT: 78 y.o. [place] woman   (1)  status post left lumpectomy and sentinel lymph node dissection in December 2006 for an 8-mm invasive ductal carcinoma grade 3 triple negative with no lymph node involvement.    (2)  Status post radiation therapy, completed December 2006.  Now on observation alone with no evidence of disease recurrence.    (3)  Also with a history of iron deficiency anemia, status post IV iron in October 2010 and September 2013.  Negative stool guaiac x3.    PLAN: We will resume our normal q. 6 month followup, and Tyronica will return in early April for repeat labs which will include a CBC and reticulocyte along with a ferritin level. She knows to call in the meanwhile, however, if her fatigue worsens, or if she has any additional changes or problems.   Baylen Dea    10/07/2011

## 2011-10-07 NOTE — Telephone Encounter (Signed)
gve the pt her April 2014 appt calendar °

## 2011-10-23 DIAGNOSIS — Z23 Encounter for immunization: Secondary | ICD-10-CM | POA: Diagnosis not present

## 2011-11-06 DIAGNOSIS — M75120 Complete rotator cuff tear or rupture of unspecified shoulder, not specified as traumatic: Secondary | ICD-10-CM | POA: Diagnosis not present

## 2011-11-06 DIAGNOSIS — M19019 Primary osteoarthritis, unspecified shoulder: Secondary | ICD-10-CM | POA: Diagnosis not present

## 2011-11-06 DIAGNOSIS — M25549 Pain in joints of unspecified hand: Secondary | ICD-10-CM | POA: Diagnosis not present

## 2011-11-12 DIAGNOSIS — M67919 Unspecified disorder of synovium and tendon, unspecified shoulder: Secondary | ICD-10-CM | POA: Diagnosis not present

## 2011-11-12 DIAGNOSIS — M25519 Pain in unspecified shoulder: Secondary | ICD-10-CM | POA: Diagnosis not present

## 2011-11-18 ENCOUNTER — Other Ambulatory Visit: Payer: Medicare Other | Admitting: Lab

## 2011-11-18 ENCOUNTER — Other Ambulatory Visit: Payer: Self-pay | Admitting: Physician Assistant

## 2011-11-18 ENCOUNTER — Ambulatory Visit (HOSPITAL_BASED_OUTPATIENT_CLINIC_OR_DEPARTMENT_OTHER): Payer: Medicare Other | Admitting: Lab

## 2011-11-18 DIAGNOSIS — E611 Iron deficiency: Secondary | ICD-10-CM

## 2011-11-18 DIAGNOSIS — D649 Anemia, unspecified: Secondary | ICD-10-CM

## 2011-11-18 DIAGNOSIS — Z853 Personal history of malignant neoplasm of breast: Secondary | ICD-10-CM

## 2011-11-18 DIAGNOSIS — D509 Iron deficiency anemia, unspecified: Secondary | ICD-10-CM | POA: Diagnosis not present

## 2011-11-18 LAB — CBC & DIFF AND RETIC
BASO%: 0.7 % (ref 0.0–2.0)
EOS%: 6.1 % (ref 0.0–7.0)
HCT: 35 % (ref 34.8–46.6)
Immature Retic Fract: 3.9 % (ref 1.60–10.00)
MCH: 31.1 pg (ref 25.1–34.0)
MCHC: 33.7 g/dL (ref 31.5–36.0)
MONO%: 9.3 % (ref 0.0–14.0)
NEUT%: 55.7 % (ref 38.4–76.8)
RDW: 14.2 % (ref 11.2–14.5)
lymph#: 1.7 10*3/uL (ref 0.9–3.3)

## 2011-11-18 LAB — FERRITIN: Ferritin: 155 ng/mL (ref 10–291)

## 2011-11-19 DIAGNOSIS — M67919 Unspecified disorder of synovium and tendon, unspecified shoulder: Secondary | ICD-10-CM | POA: Diagnosis not present

## 2011-11-19 DIAGNOSIS — M25519 Pain in unspecified shoulder: Secondary | ICD-10-CM | POA: Diagnosis not present

## 2011-11-25 ENCOUNTER — Ambulatory Visit: Payer: Medicare Other | Admitting: Physician Assistant

## 2011-12-01 DIAGNOSIS — M25519 Pain in unspecified shoulder: Secondary | ICD-10-CM | POA: Diagnosis not present

## 2011-12-02 DIAGNOSIS — M25519 Pain in unspecified shoulder: Secondary | ICD-10-CM | POA: Diagnosis not present

## 2011-12-02 DIAGNOSIS — M719 Bursopathy, unspecified: Secondary | ICD-10-CM | POA: Diagnosis not present

## 2011-12-08 ENCOUNTER — Other Ambulatory Visit: Payer: Medicare Other | Admitting: Lab

## 2011-12-09 DIAGNOSIS — M67919 Unspecified disorder of synovium and tendon, unspecified shoulder: Secondary | ICD-10-CM | POA: Diagnosis not present

## 2011-12-09 DIAGNOSIS — M719 Bursopathy, unspecified: Secondary | ICD-10-CM | POA: Diagnosis not present

## 2011-12-09 DIAGNOSIS — M25519 Pain in unspecified shoulder: Secondary | ICD-10-CM | POA: Diagnosis not present

## 2011-12-15 ENCOUNTER — Ambulatory Visit: Payer: Medicare Other | Admitting: Physician Assistant

## 2011-12-15 DIAGNOSIS — M25519 Pain in unspecified shoulder: Secondary | ICD-10-CM | POA: Diagnosis not present

## 2011-12-15 DIAGNOSIS — M719 Bursopathy, unspecified: Secondary | ICD-10-CM | POA: Diagnosis not present

## 2011-12-15 DIAGNOSIS — M67919 Unspecified disorder of synovium and tendon, unspecified shoulder: Secondary | ICD-10-CM | POA: Diagnosis not present

## 2011-12-23 DIAGNOSIS — H409 Unspecified glaucoma: Secondary | ICD-10-CM | POA: Diagnosis not present

## 2011-12-23 DIAGNOSIS — H4011X0 Primary open-angle glaucoma, stage unspecified: Secondary | ICD-10-CM | POA: Diagnosis not present

## 2011-12-26 DIAGNOSIS — M159 Polyosteoarthritis, unspecified: Secondary | ICD-10-CM | POA: Diagnosis not present

## 2011-12-26 DIAGNOSIS — I10 Essential (primary) hypertension: Secondary | ICD-10-CM | POA: Diagnosis not present

## 2011-12-26 DIAGNOSIS — E039 Hypothyroidism, unspecified: Secondary | ICD-10-CM | POA: Diagnosis not present

## 2012-02-23 DIAGNOSIS — H409 Unspecified glaucoma: Secondary | ICD-10-CM | POA: Diagnosis not present

## 2012-02-23 DIAGNOSIS — H4011X0 Primary open-angle glaucoma, stage unspecified: Secondary | ICD-10-CM | POA: Diagnosis not present

## 2012-04-06 ENCOUNTER — Telehealth: Payer: Self-pay | Admitting: Oncology

## 2012-04-08 ENCOUNTER — Other Ambulatory Visit: Payer: Medicare Other | Admitting: Lab

## 2012-04-08 DIAGNOSIS — H409 Unspecified glaucoma: Secondary | ICD-10-CM | POA: Diagnosis not present

## 2012-04-08 DIAGNOSIS — H401190 Primary open-angle glaucoma, unspecified eye, stage unspecified: Secondary | ICD-10-CM | POA: Insufficient documentation

## 2012-04-08 DIAGNOSIS — H4011X0 Primary open-angle glaucoma, stage unspecified: Secondary | ICD-10-CM | POA: Diagnosis not present

## 2012-04-09 ENCOUNTER — Other Ambulatory Visit (HOSPITAL_BASED_OUTPATIENT_CLINIC_OR_DEPARTMENT_OTHER): Payer: Medicare Other | Admitting: Lab

## 2012-04-09 DIAGNOSIS — E611 Iron deficiency: Secondary | ICD-10-CM

## 2012-04-09 DIAGNOSIS — D649 Anemia, unspecified: Secondary | ICD-10-CM | POA: Diagnosis not present

## 2012-04-09 DIAGNOSIS — Z853 Personal history of malignant neoplasm of breast: Secondary | ICD-10-CM

## 2012-04-09 DIAGNOSIS — D509 Iron deficiency anemia, unspecified: Secondary | ICD-10-CM | POA: Diagnosis not present

## 2012-04-09 LAB — COMPREHENSIVE METABOLIC PANEL (CC13)
ALT: 17 U/L (ref 0–55)
BUN: 22.2 mg/dL (ref 7.0–26.0)
CO2: 21 mEq/L — ABNORMAL LOW (ref 22–29)
Creatinine: 1 mg/dL (ref 0.6–1.1)
Total Bilirubin: 0.45 mg/dL (ref 0.20–1.20)

## 2012-04-09 LAB — CBC & DIFF AND RETIC
Basophils Absolute: 0 10*3/uL (ref 0.0–0.1)
EOS%: 6.5 % (ref 0.0–7.0)
HCT: 37.3 % (ref 34.8–46.6)
HGB: 12.7 g/dL (ref 11.6–15.9)
Immature Retic Fract: 5.6 % (ref 1.60–10.00)
LYMPH%: 20.7 % (ref 14.0–49.7)
MCH: 31.6 pg (ref 25.1–34.0)
MCV: 92.8 fL (ref 79.5–101.0)
MONO%: 7.4 % (ref 0.0–14.0)
NEUT%: 64.9 % (ref 38.4–76.8)
Platelets: 267 10*3/uL (ref 145–400)

## 2012-04-15 ENCOUNTER — Ambulatory Visit (HOSPITAL_BASED_OUTPATIENT_CLINIC_OR_DEPARTMENT_OTHER): Payer: Medicare Other | Admitting: Physician Assistant

## 2012-04-15 ENCOUNTER — Encounter: Payer: Self-pay | Admitting: Physician Assistant

## 2012-04-15 ENCOUNTER — Telehealth: Payer: Self-pay | Admitting: *Deleted

## 2012-04-15 VITALS — BP 108/55 | HR 63 | Temp 98.3°F | Resp 20 | Ht 60.0 in | Wt 112.6 lb

## 2012-04-15 DIAGNOSIS — Z853 Personal history of malignant neoplasm of breast: Secondary | ICD-10-CM

## 2012-04-15 DIAGNOSIS — D509 Iron deficiency anemia, unspecified: Secondary | ICD-10-CM | POA: Diagnosis not present

## 2012-04-15 DIAGNOSIS — E611 Iron deficiency: Secondary | ICD-10-CM

## 2012-04-15 DIAGNOSIS — Z1231 Encounter for screening mammogram for malignant neoplasm of breast: Secondary | ICD-10-CM

## 2012-04-15 NOTE — Progress Notes (Signed)
ID: Andrea Gilmore   DOB: [birth year]  MR#: [id number]  HYQ#:[id number]   PCP:  Lupita Raider, MD   HISTORY OF PRESENT ILLNESS: Andrea Gilmore had a screening mammogram showing suspicious calcifications.  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    The patient saw Dr. Maryagnes Amos, had an MRI of the breast on 09/11/2004, and this showed essentially some patchy enhancement in the medial aspect of the left breast corresponding with the prior area of ductal carcinoma in situ.  There were no other lesions.    Accordingly, on 09/18/04, Dr. Maryagnes Amos proceeded to lumpectomy with sentinel lymph node excision.  The final report ([id number]), in addition to the ductal carcinoma in situ, showed a 0.8-cm area of invasive ductal carcinoma which was grade 3.  The sentinel lymph node was negative, and the estrogen, progesterone, and HercepTest were all negative on the invasive component of the tumor.  Patient received radiation therapy, completed in 2006, and has been followed since that time with observation alone.  INTERVAL HISTORY: Andrea Gilmore returns today for followup, primarily for her iron deficiency anemia, but also for her remote left breast carcinoma. She is feeling well today. Her counts have recovered nicely, and her iron is within normal limits. Her energy level has improved significantly and she tells me she "doesn't have any complaints at all".  Andrea Gilmore continues to stay busy around the house, doing all of her house work, her cooking, as well as some of her yardwork. Her family is doing well. They're planning on participating in the "hospice walk" in [place] next week.  REVIEW OF SYSTEMS: Andrea Gilmore has had no recent illnesses and denies any fevers, chills, or night sweats. She's had no rashes or skin changes. She continues to deny any signs of abnormal bruising or bleeding. Her appetite is decreased, but this is not new for her. She  denies any nausea and has had no change in bowel habits. No cough, phlegm production, shortness of breath, chest pain or palpitations. No abnormal headaches or dizziness. She's being followed at [facility] for diagnosis of glaucoma. She's noticed no change in vision. She occasionally has some lower back pain which is chronic, and denies any new pain elsewhere. She's had no peripheral swelling.  A detailed review of systems is otherwise stable and noncontributory.   PAST MEDICAL HISTORY: No past medical history on file. Significant for a remote history of peptic ulcer disease, history of osteopenia, history of hypothyroidism, history of right third digit trigger finger release, a history of arthroscopic right knee surgery, history of trauma to the right foot, history of lumbar laminectomy x 2 under Autumn Messing.  History of right breast biopsy x 2 and a left breast biopsy x 1 previously, all benign.  History of cholecystectomy, history of appendectomy, history of tonsillectomy and adenoidectomy, history of hysterectomy with bilateral salpingo-oophorectomy in 1975 for fibroids, and a history of bilateral rotator cuff surgery under Norlene Campbell.  PAST SURGICAL HISTORY: No past surgical history on file.  FAMILY HISTORY No family history on file. The patient's father died from lung cancer at the age of 50.  The patient's mother died from primary brain cancer at the age of 55.  The patient had a brother who died from unknown causes in his late 65s, and a half sister who is alive.  A full sister died at birth.  GYNECOLOGIC HISTORY: The patient is G2, P2, first pregnancy age 10, menarche age 33.  She never  had problems with hot flashes after her hysterectomy and bilateral oophorectomy in 1975 and never took hormone replacement therapy.  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  She is widowed.  Her husband died from  lung cancer in the setting of asbestos exposure. She lives by herself.  Her daughter, [name], works for [employer] here in [place].  Her son, [name], works for [employer] as an Biomedical scientist, also here in town.  The patient has 2 grandchildren and 2 great grandchildren.     ADVANCED DIRECTIVES:  HEALTH MAINTENANCE: History  Substance Use Topics  . Smoking status: Never Smoker   . Smokeless tobacco: Never Used  . Alcohol Use: No     Colonoscopy: approx 2010   PAP:  Bone density: UTD, on Reclast through Dr. Clelia Croft  Lipid panel: UTD, Dr. Clelia Croft  Allergies  Allergen Reactions  . Morphine And Related Rash  . Codeine Rash  . Blue Dyes (Parenteral) Itching  . Red Dye Itching    Current Outpatient Prescriptions  Medication Sig Dispense Refill  . aspirin 325 MG buffered tablet Take 325 mg by mouth daily.       . bimatoprost (LUMIGAN) 0.01 % SOLN 1 drop at bedtime.        . calcium carbonate (OS-CAL) 600 MG TABS Take 600 mg by mouth 2 (two) times daily with a meal. With D      . co-enzyme Q-10 30 MG capsule Take 30 mg by mouth daily.      . diazepam (VALIUM) 5 MG tablet Take 5 mg by mouth every 6 (six) hours as needed.       . fish oil-omega-3 fatty acids 1000 MG capsule Take 1 g by mouth daily.      Marland Kitchen gabapentin (NEURONTIN) 100 MG capsule Take 100 mg by mouth at bedtime as needed.        . latanoprost (XALATAN) 0.005 % ophthalmic solution       . levothyroxine (SYNTHROID, LEVOTHROID) 100 MCG tablet Take 50 mcg by mouth daily.       . Multiple Vitamins-Minerals (MULTIVITAMIN WITH MINERALS) tablet Take 1 tablet by mouth daily.         No current facility-administered medications for this visit.    OBJECTIVE: Elderly white female who appears comfortable and in no acute distress. Filed Vitals:   04/15/12 1413  BP: 108/55  Pulse: 63  Temp: 98.3 F (36.8 C)  Resp: 20     Body mass index is 21.99 kg/(m^2).    ECOG FS: 1  Filed Weights   04/15/12 1413  Weight: 112 lb 9.6  oz (51.075 kg)   Physical Exam: HEENT:  Sclerae anicteric.  Oropharynx clear.  Nodes:  No cervical or supraclavicular lymphadenopathy palpated.  Breast Exam:  Right breast is unremarkable. Left breast is status post lumpectomy. No suspicious nodularity noted, and no evidence of local recurrence. Axillae are benign bilaterally with no palpable adenopathy. Lungs:  Clear to auscultation bilaterally.  No rhonchi or wheezes auscultated. Heart:  Regular rate and rhythm.   Abdomen:  Soft, nontender.  Positive bowel sounds.  Musculoskeletal:  No focal spinal tenderness to palpation. Notable kyphosis on exam. Extremities:  No peripheral edema. Neuro:  Nonfocal, well oriented with positive affect.    LAB RESULTS: Lab Results  Component Value Date   WBC 6.3 04/09/2012   NEUTROABS 4.1 04/09/2012   HGB 12.7 04/09/2012   HCT  37.3 04/09/2012   MCV 92.8 04/09/2012   PLT 267 04/09/2012      Chemistry      Component Value Date/Time   NA 142 04/09/2012 1412   NA 142 08/29/2011 1020   K 3.8 04/09/2012 1412   K 4.2 08/29/2011 1020   CL 111* 04/09/2012 1412   CL 109 08/29/2011 1020   CO2 21* 04/09/2012 1412   CO2 24 08/29/2011 1020   BUN 22.2 04/09/2012 1412   BUN 24* 08/29/2011 1020   CREATININE 1.0 04/09/2012 1412   CREATININE 0.90 08/29/2011 1020      Component Value Date/Time   CALCIUM 9.0 04/09/2012 1412   CALCIUM 9.1 08/29/2011 1020   ALKPHOS 50 04/09/2012 1412   ALKPHOS 40 08/29/2011 1020   AST 27 04/09/2012 1412   AST 24 08/29/2011 1020   ALT 17 04/09/2012 1412   ALT 13 08/29/2011 1020   BILITOT 0.45 04/09/2012 1412   BILITOT 0.4 08/29/2011 1020       Lab Results  Component Value Date   LABCA2 22 06/18/2011   Ferritin 137 04/09/2012    155 11/18/2011   STUDIES: Bilateral mammogram at [facility] on 05/26/2011 was unremarkable.   ASSESSMENT: 78 y.o. [place] woman   (1)  status post left lumpectomy and sentinel lymph node dissection in December 2006 for an 8-mm invasive ductal carcinoma grade 3 triple negative  with no lymph node involvement.    (2)  Status post radiation therapy, completed December 2006.  Now on observation alone with no evidence of disease recurrence.    (3)  Also with a history of iron deficiency anemia, status post IV iron in October 2010 and September 2013.  Negative stool guaiac x3.    PLAN: Andrea Gilmore is doing very well, both with regards to her breast cancer and her iron deficiency anemia. She is due for her annual mammogram next month which we will schedule for her at [facility]. We'll repeat labs only in 6 months, and we'll see her for followup in one year. If she is doing well at that point, we may consider letting her "graduate" from followup.  Andrea Gilmore voices understanding and agreement with this plan, and will call with any changes or problems prior to her next scheduled appointment.   Cassady Turano    04/15/2012

## 2012-04-15 NOTE — Telephone Encounter (Signed)
appt made and printed 

## 2012-05-26 DIAGNOSIS — Z853 Personal history of malignant neoplasm of breast: Secondary | ICD-10-CM | POA: Diagnosis not present

## 2012-06-14 ENCOUNTER — Other Ambulatory Visit: Payer: Medicare Other | Admitting: Lab

## 2012-06-21 ENCOUNTER — Ambulatory Visit: Payer: Medicare Other | Admitting: Oncology

## 2012-06-25 DIAGNOSIS — Z853 Personal history of malignant neoplasm of breast: Secondary | ICD-10-CM | POA: Diagnosis not present

## 2012-06-25 DIAGNOSIS — R5383 Other fatigue: Secondary | ICD-10-CM | POA: Diagnosis not present

## 2012-06-25 DIAGNOSIS — M159 Polyosteoarthritis, unspecified: Secondary | ICD-10-CM | POA: Diagnosis not present

## 2012-06-25 DIAGNOSIS — M810 Age-related osteoporosis without current pathological fracture: Secondary | ICD-10-CM | POA: Diagnosis not present

## 2012-06-25 DIAGNOSIS — E039 Hypothyroidism, unspecified: Secondary | ICD-10-CM | POA: Diagnosis not present

## 2012-06-25 DIAGNOSIS — G4700 Insomnia, unspecified: Secondary | ICD-10-CM | POA: Diagnosis not present

## 2012-06-25 DIAGNOSIS — Z8673 Personal history of transient ischemic attack (TIA), and cerebral infarction without residual deficits: Secondary | ICD-10-CM | POA: Diagnosis not present

## 2012-06-25 DIAGNOSIS — I10 Essential (primary) hypertension: Secondary | ICD-10-CM | POA: Diagnosis not present

## 2012-06-30 DIAGNOSIS — H4011X0 Primary open-angle glaucoma, stage unspecified: Secondary | ICD-10-CM | POA: Diagnosis not present

## 2012-06-30 DIAGNOSIS — H409 Unspecified glaucoma: Secondary | ICD-10-CM | POA: Diagnosis not present

## 2012-07-05 DIAGNOSIS — N179 Acute kidney failure, unspecified: Secondary | ICD-10-CM | POA: Diagnosis not present

## 2012-07-28 DIAGNOSIS — M810 Age-related osteoporosis without current pathological fracture: Secondary | ICD-10-CM | POA: Diagnosis not present

## 2012-08-31 ENCOUNTER — Other Ambulatory Visit: Payer: Self-pay | Admitting: Family Medicine

## 2012-08-31 ENCOUNTER — Telehealth: Payer: Self-pay | Admitting: *Deleted

## 2012-08-31 ENCOUNTER — Ambulatory Visit
Admission: RE | Admit: 2012-08-31 | Discharge: 2012-08-31 | Disposition: A | Discharge status: Home / Self Care | Payer: Medicare Other | Source: Ambulatory Visit | Attending: Family Medicine | Admitting: Family Medicine

## 2012-08-31 DIAGNOSIS — R5383 Other fatigue: Secondary | ICD-10-CM

## 2012-08-31 DIAGNOSIS — M549 Dorsalgia, unspecified: Secondary | ICD-10-CM

## 2012-08-31 DIAGNOSIS — M47817 Spondylosis without myelopathy or radiculopathy, lumbosacral region: Secondary | ICD-10-CM | POA: Diagnosis not present

## 2012-08-31 DIAGNOSIS — M47814 Spondylosis without myelopathy or radiculopathy, thoracic region: Secondary | ICD-10-CM | POA: Diagnosis not present

## 2012-08-31 DIAGNOSIS — R5381 Other malaise: Secondary | ICD-10-CM | POA: Diagnosis not present

## 2012-08-31 NOTE — Telephone Encounter (Signed)
Pt left message requesting a return call from AB/PA. Morna states she was seen recently by Dr Clelia Croft and has questions per that visit that she feels Amy can answer.  This RN returned call to pt for further inquiry- and obtained identified VM. Message left that this note will be given to AB/PA and when able call would be returned.  If concerns are more urgent pt should call back to speak with nurse.

## 2012-10-04 DIAGNOSIS — M810 Age-related osteoporosis without current pathological fracture: Secondary | ICD-10-CM | POA: Diagnosis not present

## 2012-10-04 DIAGNOSIS — Z23 Encounter for immunization: Secondary | ICD-10-CM | POA: Diagnosis not present

## 2012-10-05 DIAGNOSIS — D649 Anemia, unspecified: Secondary | ICD-10-CM | POA: Diagnosis not present

## 2012-10-05 DIAGNOSIS — Z1211 Encounter for screening for malignant neoplasm of colon: Secondary | ICD-10-CM | POA: Diagnosis not present

## 2012-10-07 ENCOUNTER — Other Ambulatory Visit: Payer: Self-pay | Admitting: Family Medicine

## 2012-10-07 ENCOUNTER — Ambulatory Visit
Admission: RE | Admit: 2012-10-07 | Discharge: 2012-10-07 | Disposition: A | Discharge status: Home / Self Care | Payer: Medicare Other | Source: Ambulatory Visit | Attending: Family Medicine | Admitting: Family Medicine

## 2012-10-07 DIAGNOSIS — R590 Localized enlarged lymph nodes: Secondary | ICD-10-CM

## 2012-10-07 DIAGNOSIS — K449 Diaphragmatic hernia without obstruction or gangrene: Secondary | ICD-10-CM | POA: Diagnosis not present

## 2012-10-07 DIAGNOSIS — I889 Nonspecific lymphadenitis, unspecified: Secondary | ICD-10-CM | POA: Diagnosis not present

## 2012-10-11 ENCOUNTER — Telehealth: Payer: Self-pay | Admitting: *Deleted

## 2012-10-11 NOTE — Telephone Encounter (Signed)
Pt called to change her appt arrival time form 2p to 3pm for 10.8.14....td

## 2012-10-13 ENCOUNTER — Other Ambulatory Visit (HOSPITAL_BASED_OUTPATIENT_CLINIC_OR_DEPARTMENT_OTHER): Payer: Medicare Other | Admitting: Lab

## 2012-10-13 ENCOUNTER — Other Ambulatory Visit: Payer: Medicare Other | Admitting: Lab

## 2012-10-13 ENCOUNTER — Encounter (INDEPENDENT_AMBULATORY_CARE_PROVIDER_SITE_OTHER): Payer: Self-pay

## 2012-10-13 DIAGNOSIS — D509 Iron deficiency anemia, unspecified: Secondary | ICD-10-CM | POA: Diagnosis not present

## 2012-10-13 DIAGNOSIS — Z853 Personal history of malignant neoplasm of breast: Secondary | ICD-10-CM

## 2012-10-13 DIAGNOSIS — E611 Iron deficiency: Secondary | ICD-10-CM

## 2012-10-13 LAB — CBC & DIFF AND RETIC
BASO%: 0.4 % (ref 0.0–2.0)
Eosinophils Absolute: 0.3 10*3/uL (ref 0.0–0.5)
HCT: 31.9 % — ABNORMAL LOW (ref 34.8–46.6)
Immature Retic Fract: 6.1 % (ref 1.60–10.00)
MCHC: 33.9 g/dL (ref 31.5–36.0)
MONO#: 0.6 10*3/uL (ref 0.1–0.9)
NEUT#: 4.1 10*3/uL (ref 1.5–6.5)
NEUT%: 61 % (ref 38.4–76.8)
Retic %: 1.38 % (ref 0.70–2.10)
WBC: 6.7 10*3/uL (ref 3.9–10.3)
lymph#: 1.7 10*3/uL (ref 0.9–3.3)

## 2012-10-13 LAB — FERRITIN CHCC: Ferritin: 116 ng/ml (ref 9–269)

## 2012-10-14 ENCOUNTER — Other Ambulatory Visit: Payer: Self-pay | Admitting: Family Medicine

## 2012-10-14 DIAGNOSIS — M549 Dorsalgia, unspecified: Secondary | ICD-10-CM

## 2012-10-14 DIAGNOSIS — R634 Abnormal weight loss: Secondary | ICD-10-CM

## 2012-10-14 DIAGNOSIS — Z853 Personal history of malignant neoplasm of breast: Secondary | ICD-10-CM

## 2012-10-14 DIAGNOSIS — R5381 Other malaise: Secondary | ICD-10-CM

## 2012-10-14 DIAGNOSIS — D649 Anemia, unspecified: Secondary | ICD-10-CM

## 2012-10-18 ENCOUNTER — Ambulatory Visit
Admission: RE | Admit: 2012-10-18 | Discharge: 2012-10-18 | Disposition: A | Discharge status: Home / Self Care | Payer: Medicare Other | Source: Ambulatory Visit | Attending: Family Medicine | Admitting: Family Medicine

## 2012-10-18 DIAGNOSIS — R634 Abnormal weight loss: Secondary | ICD-10-CM

## 2012-10-18 DIAGNOSIS — D649 Anemia, unspecified: Secondary | ICD-10-CM

## 2012-10-18 DIAGNOSIS — K449 Diaphragmatic hernia without obstruction or gangrene: Secondary | ICD-10-CM | POA: Diagnosis not present

## 2012-10-18 DIAGNOSIS — Z853 Personal history of malignant neoplasm of breast: Secondary | ICD-10-CM

## 2012-10-18 DIAGNOSIS — K5730 Diverticulosis of large intestine without perforation or abscess without bleeding: Secondary | ICD-10-CM | POA: Diagnosis not present

## 2012-10-18 DIAGNOSIS — R5381 Other malaise: Secondary | ICD-10-CM

## 2012-10-18 DIAGNOSIS — M549 Dorsalgia, unspecified: Secondary | ICD-10-CM

## 2012-10-18 MED ORDER — IOHEXOL 300 MG/ML  SOLN
100.0000 mL | Freq: Once | INTRAMUSCULAR | Status: AC | PRN
  Administered 2012-10-18: 100 mL via INTRAVENOUS

## 2012-10-19 ENCOUNTER — Other Ambulatory Visit: Payer: Medicare Other

## 2012-11-08 DIAGNOSIS — D649 Anemia, unspecified: Secondary | ICD-10-CM | POA: Diagnosis not present

## 2012-11-08 DIAGNOSIS — D539 Nutritional anemia, unspecified: Secondary | ICD-10-CM | POA: Diagnosis not present

## 2012-11-15 DIAGNOSIS — H409 Unspecified glaucoma: Secondary | ICD-10-CM | POA: Diagnosis not present

## 2012-11-15 DIAGNOSIS — H4011X0 Primary open-angle glaucoma, stage unspecified: Secondary | ICD-10-CM | POA: Diagnosis not present

## 2012-11-16 DIAGNOSIS — M19019 Primary osteoarthritis, unspecified shoulder: Secondary | ICD-10-CM | POA: Diagnosis not present

## 2012-12-20 DIAGNOSIS — M19019 Primary osteoarthritis, unspecified shoulder: Secondary | ICD-10-CM | POA: Diagnosis not present

## 2012-12-24 DIAGNOSIS — M19019 Primary osteoarthritis, unspecified shoulder: Secondary | ICD-10-CM | POA: Diagnosis not present

## 2013-04-04 DIAGNOSIS — H409 Unspecified glaucoma: Secondary | ICD-10-CM | POA: Diagnosis not present

## 2013-04-04 DIAGNOSIS — H4011X0 Primary open-angle glaucoma, stage unspecified: Secondary | ICD-10-CM | POA: Diagnosis not present

## 2013-04-07 ENCOUNTER — Other Ambulatory Visit (HOSPITAL_BASED_OUTPATIENT_CLINIC_OR_DEPARTMENT_OTHER): Payer: Medicare Other

## 2013-04-07 DIAGNOSIS — Z853 Personal history of malignant neoplasm of breast: Secondary | ICD-10-CM | POA: Diagnosis not present

## 2013-04-07 DIAGNOSIS — D509 Iron deficiency anemia, unspecified: Secondary | ICD-10-CM | POA: Diagnosis not present

## 2013-04-07 DIAGNOSIS — E611 Iron deficiency: Secondary | ICD-10-CM

## 2013-04-07 LAB — COMPREHENSIVE METABOLIC PANEL (CC13)
ALBUMIN: 4 g/dL (ref 3.5–5.0)
ALK PHOS: 57 U/L (ref 40–150)
ALT: 13 U/L (ref 0–55)
AST: 28 U/L (ref 5–34)
Anion Gap: 14 mEq/L — ABNORMAL HIGH (ref 3–11)
BILIRUBIN TOTAL: 0.38 mg/dL (ref 0.20–1.20)
BUN: 32.2 mg/dL — AB (ref 7.0–26.0)
CO2: 20 mEq/L — ABNORMAL LOW (ref 22–29)
Calcium: 10.2 mg/dL (ref 8.4–10.4)
Chloride: 108 mEq/L (ref 98–109)
Creatinine: 1.4 mg/dL — ABNORMAL HIGH (ref 0.6–1.1)
Glucose: 83 mg/dl (ref 70–140)
Potassium: 4.2 mEq/L (ref 3.5–5.1)
Sodium: 142 mEq/L (ref 136–145)
Total Protein: 7.5 g/dL (ref 6.4–8.3)

## 2013-04-07 LAB — CBC & DIFF AND RETIC
BASO%: 0.6 % (ref 0.0–2.0)
Basophils Absolute: 0 10*3/uL (ref 0.0–0.1)
EOS%: 5.9 % (ref 0.0–7.0)
Eosinophils Absolute: 0.4 10*3/uL (ref 0.0–0.5)
HCT: 35.1 % (ref 34.8–46.6)
HGB: 11.8 g/dL (ref 11.6–15.9)
IMMATURE RETIC FRACT: 3.2 % (ref 1.60–10.00)
LYMPH#: 1.4 10*3/uL (ref 0.9–3.3)
LYMPH%: 21.3 % (ref 14.0–49.7)
MCH: 29.6 pg (ref 25.1–34.0)
MCHC: 33.6 g/dL (ref 31.5–36.0)
MCV: 88.2 fL (ref 79.5–101.0)
MONO#: 0.7 10*3/uL (ref 0.1–0.9)
MONO%: 9.7 % (ref 0.0–14.0)
NEUT#: 4.2 10*3/uL (ref 1.5–6.5)
NEUT%: 62.5 % (ref 38.4–76.8)
NRBC: 0 % (ref 0–0)
Platelets: 293 10*3/uL (ref 145–400)
RBC: 3.98 10*6/uL (ref 3.70–5.45)
RDW: 13.5 % (ref 11.2–14.5)
RETIC CT ABS: 33.43 10*3/uL — AB (ref 33.70–90.70)
Retic %: 0.84 % (ref 0.70–2.10)
WBC: 6.8 10*3/uL (ref 3.9–10.3)

## 2013-04-07 LAB — FERRITIN CHCC: Ferritin: 50 ng/ml (ref 9–269)

## 2013-04-14 ENCOUNTER — Telehealth: Payer: Self-pay | Admitting: Physician Assistant

## 2013-04-14 ENCOUNTER — Encounter: Payer: Self-pay | Admitting: Physician Assistant

## 2013-04-14 ENCOUNTER — Ambulatory Visit (HOSPITAL_BASED_OUTPATIENT_CLINIC_OR_DEPARTMENT_OTHER): Payer: Medicare Other | Admitting: Physician Assistant

## 2013-04-14 VITALS — BP 167/71 | HR 51 | Temp 97.7°F | Resp 20 | Ht 60.0 in | Wt 108.4 lb

## 2013-04-14 DIAGNOSIS — Z853 Personal history of malignant neoplasm of breast: Secondary | ICD-10-CM

## 2013-04-14 DIAGNOSIS — Z862 Personal history of diseases of the blood and blood-forming organs and certain disorders involving the immune mechanism: Secondary | ICD-10-CM | POA: Diagnosis not present

## 2013-04-14 DIAGNOSIS — R7989 Other specified abnormal findings of blood chemistry: Secondary | ICD-10-CM

## 2013-04-14 DIAGNOSIS — N179 Acute kidney failure, unspecified: Secondary | ICD-10-CM | POA: Insufficient documentation

## 2013-04-14 NOTE — Progress Notes (Signed)
ID: Andrea Gilmore   DOB: Feb 14, 1925  MR#: [id number]  SFK#:[id number]   PCP:  Mayra Neer, MD GYN: SU:  Magdalene River, MD OTHER:  Joni Fears, MD;  Malena Catholic, MD  CHIEF COMPLAINTS:  1)  Hx of Left Breast Cancer      2)  Hx of Iron Deficiency Anemia   HISTORY OF PRESENT ILLNESS: Andrea Gilmore had a screening mammogram showing suspicious calcifications.  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    The patient saw Dr. Rebekah Chesterfield, had an MRI of the breast on 09/11/2004, and this showed essentially some patchy enhancement in the medial aspect of the left breast corresponding with the prior area of ductal carcinoma in situ.  There were no other lesions.    Accordingly, on 09/18/04, Dr. Rebekah Chesterfield proceeded to lumpectomy with sentinel lymph node excision.  The final report ([id number]), in addition to the ductal carcinoma in situ, showed a 0.8-cm area of invasive ductal carcinoma which was grade 3.  The sentinel lymph node was negative, and the estrogen, progesterone, and HercepTest were all negative on the invasive component of the tumor.  Patient received radiation therapy, completed in 2006, and has been followed since that time with observation alone.  Subsequent history is as detailed below.  INTERVAL HISTORY: Andrea Gilmore returns alone today for followup for both her remote left breast cancer and her history of iron deficiency anemia. Overall, she is doing well, although she continues to complain of feeling tired with "no energy at all".  She is still living alone. She does her own housework, and loves doing yard work which actually "gives her some energy".   She had some minor health issues over the past year, including some back pain, increased shoulder pain, and abdominal pain. She is followed regularly by Dr.  Brigitte Pulse, her primary care physician. She also sees Dr. Durward Fortes and Dr. Tamera Punt with regards to her shoulder  pain.    REVIEW OF SYSTEMS: Otherwise, Andrea Gilmore denies any recent fevers, chills, or night sweats. She's had no rashes or skin changes and denies any signs of abnormal bruising or bleeding. She's had no dark tarry stools, and in fact denies any change in her bowel habits. Her appetite remains decreased, but she tries to keep herself well hydrated. She's had no problems with nausea or emesis. She also denies any change in urinary habits, and specifically has had no dysuria or hematuria. We'll need to have some shortness of breath with exertion which is stable, but denies any cough, phlegm production, pleurisy, peripheral swelling, chest pain, or palpitations. She's had no abnormal headaches or increased dizziness. She has some chronic back pain, shoulder pain, and arthritis, all of which which are stable.  Her neck sometimes feels "tender to touch" that she's noticed no masses or lumps. She has no problems swallowing.  A detailed review of systems is otherwise stable and noncontributory.   PAST MEDICAL HISTORY: History reviewed. No pertinent past medical history. Significant for a remote history of peptic ulcer disease, history of osteopenia, history of hypothyroidism, history of right third digit trigger finger release, a history of arthroscopic right knee surgery, history of trauma to the right foot, history of lumbar laminectomy x 2 under Jetta Lout.  History of right breast biopsy x 2 and a left breast biopsy x 1 previously, all benign.  History of cholecystectomy, history of appendectomy, history of tonsillectomy and adenoidectomy, history of hysterectomy with bilateral salpingo-oophorectomy in 1975 for fibroids,  and a history of bilateral rotator cuff surgery under Joni Fears.  PAST SURGICAL HISTORY: History reviewed. No pertinent past surgical history.  FAMILY HISTORY History reviewed. No pertinent family history. The patient's father died from lung cancer at the age of 7.  The  patient's mother died from primary brain cancer at the age of 62.  The patient had a brother who died from unknown causes in his late 45s, and a half sister who is alive.  A full sister died at birth.  GYNECOLOGIC HISTORY: The patient is G2, P2, first pregnancy age 61, menarche age 36.  She never had problems with hot flashes after her hysterectomy and bilateral oophorectomy in 1975 and never took hormone replacement therapy.  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  She is widowed. Her husband died from lung cancer in the setting of asbestos exposure. She lives by herself.  Her daughter, Andrea Gilmore, works for [employer] here in [place].  Her son, Andrea Gilmore, works for [employer] as an Cytogeneticist, also here in town.  The patient has 2 grandchildren and 2 great grandchildren.     ADVANCED DIRECTIVES:  HEALTH MAINTENANCE:  (Updated 04/14/2013) History  Substance Use Topics  . Smoking status: Never Smoker   . Smokeless tobacco: Never Used  . Alcohol Use: No     Colonoscopy: approx 2010   PAP: Not on file  Bone density: UTD, on Reclast through Dr. Brigitte Pulse  Lipid panel: UTD, Dr. Brigitte Pulse   Allergies  Allergen Reactions  . Morphine And Related Rash  . Codeine Rash  . Blue Dyes (Parenteral) Itching  . Red Dye Itching    Current Outpatient Prescriptions  Medication Sig Dispense Refill  . aspirin 325 MG buffered tablet Take 325 mg by mouth daily.       . bimatoprost (LUMIGAN) 0.01 % SOLN 1 drop at bedtime.        . calcium carbonate (OS-CAL) 600 MG TABS Take 600 mg by mouth 2 (two) times daily with a meal. With D      . co-enzyme Q-10 30 MG capsule Take 30 mg by mouth daily.      . diazepam (VALIUM) 5 MG tablet Take 5 mg by mouth every 6 (six) hours as needed.       . fish oil-omega-3 fatty acids 1000 MG capsule Take 1 g by mouth daily.      Marland Kitchen levothyroxine (SYNTHROID,  LEVOTHROID) 100 MCG tablet Take 50 mcg by mouth daily.       . Multiple Vitamins-Minerals (MULTIVITAMIN WITH MINERALS) tablet Take 1 tablet by mouth daily.        Marland Kitchen gabapentin (NEURONTIN) 100 MG capsule Take 100 mg by mouth at bedtime as needed.        . latanoprost (XALATAN) 0.005 % ophthalmic solution        No current facility-administered medications for this visit.    OBJECTIVE: Elderly white female who appears comfortable and is in no acute distress. Ambulates with a cane for support. Filed Vitals:   04/14/13 1420  BP: 167/71  Pulse: 51  Temp: 97.7 F (36.5 C)  Resp: 20     Body mass index is 21.17 kg/(m^2).    ECOG FS: 1 Filed Weights   04/14/13 1420  Weight: 108 lb 6.4 oz (49.17 kg)   Physical Exam: HEENT:  Sclerae anicteric.  Oropharynx clear, pink, and moist.  Neck supple, trachea midline. NODES:  No cervical or supraclavicular lymphadenopathy palpated.  BREAST EXAM:  Right breast is unremarkable. Left breast is status post lumpectomy, with no suspicious nodularities or skin changes, and no evidence of local recurrence. Axillae are benign bilaterally with no palpable lymphadenopathy. LUNGS:  Clear to auscultation bilaterally.  No crackles, wheezes, or rhonchi. No dullness to percussion. HEART:  Regular rate and rhythm, slightly bradycardic  ABDOMEN:  Soft, nontender. No organomegaly or masses. No rebound or guarding. Positive bowel sounds.  MSK:  Scoliosis and kyphosis is noted on exam. No focal spinal tenderness to gentle palpation. No CVA tenderness. Some limited range of motion in the upper tremor these do to shoulder discomfort. EXTREMITIES:  No peripheral edema.  No lymphedema in the left upper extremity. SKIN:  Benign with no visible rashes or skin lesions. No pallor. NEURO:  Nonfocal. Well oriented.  Appropriate affect.     LAB RESULTS: Lab Results  Component Value Date   WBC 6.8 04/07/2013   NEUTROABS 4.2 04/07/2013   HGB 11.8 04/07/2013   HCT 35.1 04/07/2013   MCV  88.2 04/07/2013   PLT 293 04/07/2013      Chemistry      Component Value Date/Time   NA 142 04/07/2013 1348   NA 142 08/29/2011 1020   K 4.2 04/07/2013 1348   K 4.2 08/29/2011 1020   CL 111* 04/09/2012 1412   CL 109 08/29/2011 1020   CO2 20* 04/07/2013 1348   CO2 24 08/29/2011 1020   BUN 32.2* 04/07/2013 1348   BUN 24* 08/29/2011 1020   CREATININE 1.4* 04/07/2013 1348   CREATININE 0.90 08/29/2011 1020      Component Value Date/Time   CALCIUM 10.2 04/07/2013 1348   CALCIUM 9.1 08/29/2011 1020   ALKPHOS 57 04/07/2013 1348   ALKPHOS 40 08/29/2011 1020   AST 28 04/07/2013 1348   AST 24 08/29/2011 1020   ALT 13 04/07/2013 1348   ALT 13 08/29/2011 1020   BILITOT 0.38 04/07/2013 1348   BILITOT 0.4 08/29/2011 1020      Ferritin  50 04/07/2013   116 10/13/2012   137 04/09/2012    155 11/18/2011   STUDIES: Bilateral mammogram at [facility] on 05/26/2012 was unremarkable.  This is scheduled to be repeated in may 2015.   ASSESSMENT: 80 y.o. [place] woman   (1)  status post left lumpectomy and sentinel lymph node dissection in December 2006 for an 8-mm invasive ductal carcinoma grade 3 triple negative with no lymph node involvement.    (2)  Status post radiation therapy, completed December 2006.  Now on observation alone with no evidence of disease recurrence.    (3)  Also with a history of iron deficiency anemia, status post IV iron in October 2010 and September 2013.  Negative stool guaiac x3.    PLAN: Halee is doing well overall, both with regards to her breast cancer and her iron deficiency anemia. She is due for her annual mammogram next month which has already been scheduled at [facility]. We can actually consider allowing Lemmie to "graduate" from followup, but she is much more comfortable being seen here on an annual basis.    She also appreciates having her labs checked every 6 months so that we "keep a close eye on everything", primarily her history of iron deficiency anemia. In fact, although both her  hemoglobin and her ferritin are normal, there has been some decrease in her ferritin over the last 6 months from 116 in  October 2014 to 50 in April 2015.  Accordingly, Mae will return in 6 months, approximately October 2015, for repeat labs. We will then plan on seeing her again in early June of next year after she has her annual mammogram in May. Again, at that time, we will consider allowing her to "graduate" from followup. In the meanwhile, she knows to call if she has any changes or problems.   I will also mention that Jessey's serum creatinine and BUN were slightly elevated on her labs last week at 1.4 and 32.2 respectively. I will forward all of her recent labs to her primary care physician, Dr. Brigitte Pulse, who she is scheduled to see again in June.   Dj Senteno Milda Smart  PA-C   04/14/2013

## 2013-04-14 NOTE — Telephone Encounter (Signed)
per pof to sch labs only 10/2013 & labs1 wk prior/appt 06/2014-printed & gave pt copy of sch

## 2013-04-27 DIAGNOSIS — H4011X0 Primary open-angle glaucoma, stage unspecified: Secondary | ICD-10-CM | POA: Diagnosis not present

## 2013-04-27 DIAGNOSIS — H409 Unspecified glaucoma: Secondary | ICD-10-CM | POA: Diagnosis not present

## 2013-05-16 ENCOUNTER — Other Ambulatory Visit: Payer: Self-pay | Admitting: *Deleted

## 2013-05-16 DIAGNOSIS — Z853 Personal history of malignant neoplasm of breast: Secondary | ICD-10-CM

## 2013-05-18 DIAGNOSIS — M19019 Primary osteoarthritis, unspecified shoulder: Secondary | ICD-10-CM | POA: Diagnosis not present

## 2013-05-31 DIAGNOSIS — Z853 Personal history of malignant neoplasm of breast: Secondary | ICD-10-CM | POA: Diagnosis not present

## 2013-06-08 DIAGNOSIS — M25569 Pain in unspecified knee: Secondary | ICD-10-CM | POA: Diagnosis not present

## 2013-06-08 DIAGNOSIS — M1710 Unilateral primary osteoarthritis, unspecified knee: Secondary | ICD-10-CM | POA: Diagnosis not present

## 2013-06-29 ENCOUNTER — Other Ambulatory Visit: Payer: Self-pay | Admitting: Family Medicine

## 2013-06-29 DIAGNOSIS — R220 Localized swelling, mass and lump, head: Secondary | ICD-10-CM | POA: Diagnosis not present

## 2013-06-29 DIAGNOSIS — R221 Localized swelling, mass and lump, neck: Principal | ICD-10-CM

## 2013-06-29 DIAGNOSIS — G479 Sleep disorder, unspecified: Secondary | ICD-10-CM | POA: Diagnosis not present

## 2013-06-29 DIAGNOSIS — Z23 Encounter for immunization: Secondary | ICD-10-CM | POA: Diagnosis not present

## 2013-06-29 DIAGNOSIS — K117 Disturbances of salivary secretion: Secondary | ICD-10-CM | POA: Diagnosis not present

## 2013-06-29 DIAGNOSIS — I10 Essential (primary) hypertension: Secondary | ICD-10-CM | POA: Diagnosis not present

## 2013-06-29 DIAGNOSIS — Z853 Personal history of malignant neoplasm of breast: Secondary | ICD-10-CM | POA: Diagnosis not present

## 2013-06-29 DIAGNOSIS — E039 Hypothyroidism, unspecified: Secondary | ICD-10-CM | POA: Diagnosis not present

## 2013-06-29 DIAGNOSIS — M159 Polyosteoarthritis, unspecified: Secondary | ICD-10-CM | POA: Diagnosis not present

## 2013-06-30 DIAGNOSIS — I10 Essential (primary) hypertension: Secondary | ICD-10-CM | POA: Diagnosis not present

## 2013-07-01 ENCOUNTER — Ambulatory Visit
Admission: RE | Admit: 2013-07-01 | Discharge: 2013-07-01 | Disposition: A | Discharge status: Home / Self Care | Payer: Medicare Other | Source: Ambulatory Visit | Attending: Family Medicine | Admitting: Family Medicine

## 2013-07-01 DIAGNOSIS — R220 Localized swelling, mass and lump, head: Secondary | ICD-10-CM

## 2013-07-01 DIAGNOSIS — R6889 Other general symptoms and signs: Secondary | ICD-10-CM | POA: Diagnosis not present

## 2013-07-01 DIAGNOSIS — R221 Localized swelling, mass and lump, neck: Principal | ICD-10-CM

## 2013-09-14 DIAGNOSIS — M1710 Unilateral primary osteoarthritis, unspecified knee: Secondary | ICD-10-CM | POA: Diagnosis not present

## 2013-09-19 DIAGNOSIS — H409 Unspecified glaucoma: Secondary | ICD-10-CM | POA: Diagnosis not present

## 2013-09-19 DIAGNOSIS — H4011X0 Primary open-angle glaucoma, stage unspecified: Secondary | ICD-10-CM | POA: Diagnosis not present

## 2013-10-11 DIAGNOSIS — M810 Age-related osteoporosis without current pathological fracture: Secondary | ICD-10-CM | POA: Diagnosis not present

## 2013-10-11 DIAGNOSIS — Z23 Encounter for immunization: Secondary | ICD-10-CM | POA: Diagnosis not present

## 2013-10-18 ENCOUNTER — Other Ambulatory Visit: Payer: Self-pay | Admitting: *Deleted

## 2013-10-18 ENCOUNTER — Other Ambulatory Visit (HOSPITAL_BASED_OUTPATIENT_CLINIC_OR_DEPARTMENT_OTHER): Payer: Medicare Other

## 2013-10-18 DIAGNOSIS — Z862 Personal history of diseases of the blood and blood-forming organs and certain disorders involving the immune mechanism: Secondary | ICD-10-CM | POA: Diagnosis not present

## 2013-10-18 DIAGNOSIS — Z853 Personal history of malignant neoplasm of breast: Secondary | ICD-10-CM

## 2013-10-18 LAB — COMPREHENSIVE METABOLIC PANEL (CC13)
ALK PHOS: 50 U/L (ref 40–150)
ALT: 18 U/L (ref 0–55)
AST: 29 U/L (ref 5–34)
Albumin: 3.7 g/dL (ref 3.5–5.0)
Anion Gap: 9 mEq/L (ref 3–11)
BUN: 21.6 mg/dL (ref 7.0–26.0)
CO2: 20 mEq/L — ABNORMAL LOW (ref 22–29)
CREATININE: 1.3 mg/dL — AB (ref 0.6–1.1)
Calcium: 8.9 mg/dL (ref 8.4–10.4)
Chloride: 112 mEq/L — ABNORMAL HIGH (ref 98–109)
Glucose: 82 mg/dl (ref 70–140)
Potassium: 4.4 mEq/L (ref 3.5–5.1)
Sodium: 142 mEq/L (ref 136–145)
Total Bilirubin: 0.42 mg/dL (ref 0.20–1.20)
Total Protein: 6.7 g/dL (ref 6.4–8.3)

## 2013-10-18 LAB — CBC WITH DIFFERENTIAL/PLATELET
BASO%: 0.6 % (ref 0.0–2.0)
BASOS ABS: 0 10*3/uL (ref 0.0–0.1)
EOS%: 6.5 % (ref 0.0–7.0)
Eosinophils Absolute: 0.4 10*3/uL (ref 0.0–0.5)
HEMATOCRIT: 33.1 % — AB (ref 34.8–46.6)
HEMOGLOBIN: 11.2 g/dL — AB (ref 11.6–15.9)
LYMPH%: 25.9 % (ref 14.0–49.7)
MCH: 29.7 pg (ref 25.1–34.0)
MCHC: 33.8 g/dL (ref 31.5–36.0)
MCV: 87.8 fL (ref 79.5–101.0)
MONO#: 0.6 10*3/uL (ref 0.1–0.9)
MONO%: 9.2 % (ref 0.0–14.0)
NEUT#: 3.7 10*3/uL (ref 1.5–6.5)
NEUT%: 57.8 % (ref 38.4–76.8)
Platelets: 255 10*3/uL (ref 145–400)
RBC: 3.77 10*6/uL (ref 3.70–5.45)
RDW: 15 % — ABNORMAL HIGH (ref 11.2–14.5)
WBC: 6.3 10*3/uL (ref 3.9–10.3)
lymph#: 1.6 10*3/uL (ref 0.9–3.3)

## 2013-10-18 LAB — FERRITIN CHCC: FERRITIN: 89 ng/mL (ref 9–269)

## 2013-11-09 DIAGNOSIS — M12811 Other specific arthropathies, not elsewhere classified, right shoulder: Secondary | ICD-10-CM | POA: Diagnosis not present

## 2013-11-09 DIAGNOSIS — M12812 Other specific arthropathies, not elsewhere classified, left shoulder: Secondary | ICD-10-CM | POA: Diagnosis not present

## 2014-01-04 DIAGNOSIS — M21371 Foot drop, right foot: Secondary | ICD-10-CM | POA: Diagnosis not present

## 2014-01-04 DIAGNOSIS — E039 Hypothyroidism, unspecified: Secondary | ICD-10-CM | POA: Diagnosis not present

## 2014-01-04 DIAGNOSIS — I10 Essential (primary) hypertension: Secondary | ICD-10-CM | POA: Diagnosis not present

## 2014-01-04 DIAGNOSIS — M21372 Foot drop, left foot: Secondary | ICD-10-CM | POA: Diagnosis not present

## 2014-01-04 DIAGNOSIS — M4800 Spinal stenosis, site unspecified: Secondary | ICD-10-CM | POA: Diagnosis not present

## 2014-01-04 DIAGNOSIS — E46 Unspecified protein-calorie malnutrition: Secondary | ICD-10-CM | POA: Diagnosis not present

## 2014-01-04 DIAGNOSIS — K5730 Diverticulosis of large intestine without perforation or abscess without bleeding: Secondary | ICD-10-CM | POA: Diagnosis not present

## 2014-01-04 DIAGNOSIS — G629 Polyneuropathy, unspecified: Secondary | ICD-10-CM | POA: Diagnosis not present

## 2014-01-04 DIAGNOSIS — Z0001 Encounter for general adult medical examination with abnormal findings: Secondary | ICD-10-CM | POA: Diagnosis not present

## 2014-01-11 DIAGNOSIS — Z9181 History of falling: Secondary | ICD-10-CM | POA: Diagnosis not present

## 2014-01-11 DIAGNOSIS — G629 Polyneuropathy, unspecified: Secondary | ICD-10-CM | POA: Diagnosis not present

## 2014-01-11 DIAGNOSIS — R262 Difficulty in walking, not elsewhere classified: Secondary | ICD-10-CM | POA: Diagnosis not present

## 2014-01-11 DIAGNOSIS — M4806 Spinal stenosis, lumbar region: Secondary | ICD-10-CM | POA: Diagnosis not present

## 2014-01-13 DIAGNOSIS — G629 Polyneuropathy, unspecified: Secondary | ICD-10-CM | POA: Diagnosis not present

## 2014-01-13 DIAGNOSIS — R262 Difficulty in walking, not elsewhere classified: Secondary | ICD-10-CM | POA: Diagnosis not present

## 2014-01-13 DIAGNOSIS — Z9181 History of falling: Secondary | ICD-10-CM | POA: Diagnosis not present

## 2014-01-13 DIAGNOSIS — M4806 Spinal stenosis, lumbar region: Secondary | ICD-10-CM | POA: Diagnosis not present

## 2014-01-17 DIAGNOSIS — Z9181 History of falling: Secondary | ICD-10-CM | POA: Diagnosis not present

## 2014-01-17 DIAGNOSIS — G629 Polyneuropathy, unspecified: Secondary | ICD-10-CM | POA: Diagnosis not present

## 2014-01-17 DIAGNOSIS — M4806 Spinal stenosis, lumbar region: Secondary | ICD-10-CM | POA: Diagnosis not present

## 2014-01-17 DIAGNOSIS — R262 Difficulty in walking, not elsewhere classified: Secondary | ICD-10-CM | POA: Diagnosis not present

## 2014-01-19 DIAGNOSIS — M4806 Spinal stenosis, lumbar region: Secondary | ICD-10-CM | POA: Diagnosis not present

## 2014-01-19 DIAGNOSIS — R262 Difficulty in walking, not elsewhere classified: Secondary | ICD-10-CM | POA: Diagnosis not present

## 2014-01-19 DIAGNOSIS — G629 Polyneuropathy, unspecified: Secondary | ICD-10-CM | POA: Diagnosis not present

## 2014-01-19 DIAGNOSIS — Z9181 History of falling: Secondary | ICD-10-CM | POA: Diagnosis not present

## 2014-01-23 DIAGNOSIS — G629 Polyneuropathy, unspecified: Secondary | ICD-10-CM | POA: Diagnosis not present

## 2014-01-23 DIAGNOSIS — R262 Difficulty in walking, not elsewhere classified: Secondary | ICD-10-CM | POA: Diagnosis not present

## 2014-01-23 DIAGNOSIS — M4806 Spinal stenosis, lumbar region: Secondary | ICD-10-CM | POA: Diagnosis not present

## 2014-01-23 DIAGNOSIS — Z9181 History of falling: Secondary | ICD-10-CM | POA: Diagnosis not present

## 2014-01-25 DIAGNOSIS — M4806 Spinal stenosis, lumbar region: Secondary | ICD-10-CM | POA: Diagnosis not present

## 2014-01-25 DIAGNOSIS — R262 Difficulty in walking, not elsewhere classified: Secondary | ICD-10-CM | POA: Diagnosis not present

## 2014-01-25 DIAGNOSIS — G629 Polyneuropathy, unspecified: Secondary | ICD-10-CM | POA: Diagnosis not present

## 2014-01-25 DIAGNOSIS — Z9181 History of falling: Secondary | ICD-10-CM | POA: Diagnosis not present

## 2014-02-06 DIAGNOSIS — Z9181 History of falling: Secondary | ICD-10-CM | POA: Diagnosis not present

## 2014-02-06 DIAGNOSIS — M4806 Spinal stenosis, lumbar region: Secondary | ICD-10-CM | POA: Diagnosis not present

## 2014-02-06 DIAGNOSIS — R262 Difficulty in walking, not elsewhere classified: Secondary | ICD-10-CM | POA: Diagnosis not present

## 2014-02-06 DIAGNOSIS — G629 Polyneuropathy, unspecified: Secondary | ICD-10-CM | POA: Diagnosis not present

## 2014-02-08 DIAGNOSIS — G629 Polyneuropathy, unspecified: Secondary | ICD-10-CM | POA: Diagnosis not present

## 2014-02-08 DIAGNOSIS — Z9181 History of falling: Secondary | ICD-10-CM | POA: Diagnosis not present

## 2014-02-08 DIAGNOSIS — R262 Difficulty in walking, not elsewhere classified: Secondary | ICD-10-CM | POA: Diagnosis not present

## 2014-02-08 DIAGNOSIS — M4806 Spinal stenosis, lumbar region: Secondary | ICD-10-CM | POA: Diagnosis not present

## 2014-02-13 DIAGNOSIS — R262 Difficulty in walking, not elsewhere classified: Secondary | ICD-10-CM | POA: Diagnosis not present

## 2014-02-13 DIAGNOSIS — M4806 Spinal stenosis, lumbar region: Secondary | ICD-10-CM | POA: Diagnosis not present

## 2014-02-13 DIAGNOSIS — G629 Polyneuropathy, unspecified: Secondary | ICD-10-CM | POA: Diagnosis not present

## 2014-02-13 DIAGNOSIS — Z9181 History of falling: Secondary | ICD-10-CM | POA: Diagnosis not present

## 2014-02-15 DIAGNOSIS — G629 Polyneuropathy, unspecified: Secondary | ICD-10-CM | POA: Diagnosis not present

## 2014-02-15 DIAGNOSIS — Z9181 History of falling: Secondary | ICD-10-CM | POA: Diagnosis not present

## 2014-02-15 DIAGNOSIS — M4806 Spinal stenosis, lumbar region: Secondary | ICD-10-CM | POA: Diagnosis not present

## 2014-02-15 DIAGNOSIS — R262 Difficulty in walking, not elsewhere classified: Secondary | ICD-10-CM | POA: Diagnosis not present

## 2014-02-23 DIAGNOSIS — M4806 Spinal stenosis, lumbar region: Secondary | ICD-10-CM | POA: Diagnosis not present

## 2014-02-23 DIAGNOSIS — G629 Polyneuropathy, unspecified: Secondary | ICD-10-CM | POA: Diagnosis not present

## 2014-02-23 DIAGNOSIS — R262 Difficulty in walking, not elsewhere classified: Secondary | ICD-10-CM | POA: Diagnosis not present

## 2014-02-23 DIAGNOSIS — Z9181 History of falling: Secondary | ICD-10-CM | POA: Diagnosis not present

## 2014-02-27 DIAGNOSIS — G629 Polyneuropathy, unspecified: Secondary | ICD-10-CM | POA: Diagnosis not present

## 2014-02-27 DIAGNOSIS — Z9181 History of falling: Secondary | ICD-10-CM | POA: Diagnosis not present

## 2014-02-27 DIAGNOSIS — R262 Difficulty in walking, not elsewhere classified: Secondary | ICD-10-CM | POA: Diagnosis not present

## 2014-02-27 DIAGNOSIS — M4806 Spinal stenosis, lumbar region: Secondary | ICD-10-CM | POA: Diagnosis not present

## 2014-03-07 DIAGNOSIS — M4806 Spinal stenosis, lumbar region: Secondary | ICD-10-CM | POA: Diagnosis not present

## 2014-03-07 DIAGNOSIS — G629 Polyneuropathy, unspecified: Secondary | ICD-10-CM | POA: Diagnosis not present

## 2014-03-07 DIAGNOSIS — Z9181 History of falling: Secondary | ICD-10-CM | POA: Diagnosis not present

## 2014-03-07 DIAGNOSIS — R262 Difficulty in walking, not elsewhere classified: Secondary | ICD-10-CM | POA: Diagnosis not present

## 2014-03-08 DIAGNOSIS — M12812 Other specific arthropathies, not elsewhere classified, left shoulder: Secondary | ICD-10-CM | POA: Diagnosis not present

## 2014-03-08 DIAGNOSIS — M12811 Other specific arthropathies, not elsewhere classified, right shoulder: Secondary | ICD-10-CM | POA: Diagnosis not present

## 2014-03-13 DIAGNOSIS — H4011X3 Primary open-angle glaucoma, severe stage: Secondary | ICD-10-CM | POA: Diagnosis not present

## 2014-04-12 DIAGNOSIS — M12812 Other specific arthropathies, not elsewhere classified, left shoulder: Secondary | ICD-10-CM | POA: Diagnosis not present

## 2014-05-01 ENCOUNTER — Emergency Department (HOSPITAL_COMMUNITY)
Admission: EM | Admit: 2014-05-01 | Discharge: 2014-05-01 | Disposition: A | Discharge status: Home / Self Care | Payer: Medicare Other | Attending: Emergency Medicine | Admitting: Emergency Medicine

## 2014-05-01 ENCOUNTER — Emergency Department (HOSPITAL_COMMUNITY): Payer: Medicare Other

## 2014-05-01 ENCOUNTER — Encounter (HOSPITAL_COMMUNITY): Payer: Self-pay

## 2014-05-01 DIAGNOSIS — Z23 Encounter for immunization: Secondary | ICD-10-CM | POA: Insufficient documentation

## 2014-05-01 DIAGNOSIS — Z853 Personal history of malignant neoplasm of breast: Secondary | ICD-10-CM | POA: Insufficient documentation

## 2014-05-01 DIAGNOSIS — S0101XA Laceration without foreign body of scalp, initial encounter: Secondary | ICD-10-CM | POA: Insufficient documentation

## 2014-05-01 DIAGNOSIS — E079 Disorder of thyroid, unspecified: Secondary | ICD-10-CM | POA: Insufficient documentation

## 2014-05-01 DIAGNOSIS — Z79899 Other long term (current) drug therapy: Secondary | ICD-10-CM | POA: Insufficient documentation

## 2014-05-01 DIAGNOSIS — Y92007 Garden or yard of unspecified non-institutional (private) residence as the place of occurrence of the external cause: Secondary | ICD-10-CM | POA: Diagnosis not present

## 2014-05-01 DIAGNOSIS — M1990 Unspecified osteoarthritis, unspecified site: Secondary | ICD-10-CM | POA: Diagnosis not present

## 2014-05-01 DIAGNOSIS — Z7982 Long term (current) use of aspirin: Secondary | ICD-10-CM | POA: Insufficient documentation

## 2014-05-01 DIAGNOSIS — Y9389 Activity, other specified: Secondary | ICD-10-CM | POA: Diagnosis not present

## 2014-05-01 DIAGNOSIS — W01198A Fall on same level from slipping, tripping and stumbling with subsequent striking against other object, initial encounter: Secondary | ICD-10-CM | POA: Insufficient documentation

## 2014-05-01 DIAGNOSIS — Y998 Other external cause status: Secondary | ICD-10-CM | POA: Insufficient documentation

## 2014-05-01 DIAGNOSIS — S0990XA Unspecified injury of head, initial encounter: Secondary | ICD-10-CM | POA: Diagnosis present

## 2014-05-01 HISTORY — DX: Unspecified osteoarthritis, unspecified site: M19.90

## 2014-05-01 MED ORDER — TETANUS-DIPHTH-ACELL PERTUSSIS 5-2.5-18.5 LF-MCG/0.5 IM SUSP
0.5000 mL | Freq: Once | INTRAMUSCULAR | Status: AC
  Administered 2014-05-01: 0.5 mL via INTRAMUSCULAR
  Filled 2014-05-01: qty 0.5

## 2014-05-01 MED ORDER — HYDROCODONE-ACETAMINOPHEN 5-325 MG PO TABS: 1.0000 | ORAL_TABLET | Freq: Four times a day (QID) | ORAL | Status: DC | PRN

## 2014-05-01 NOTE — ED Provider Notes (Signed)
CSN: [id number]     Arrival date & time 05/01/14  1322 History   First MD Initiated Contact with Patient 05/01/14 1351     Chief Complaint  Patient presents with  . Fall  . Head Injury     (Consider location/radiation/quality/duration/timing/severity/associated sxs/prior Treatment) HPI  This is an 81 year old female who presents following a fall. Patient reports that she was pulling a steak out in her garden when she fell backwards and hit her head. Denies loss of consciousness. Denies headache or dizziness. Noted blood over her posterior head. Unknown last tetanus shot. Is not on anticoagulants but does take aspirin daily. Denies any other injury. Has been ambulatory.  Past Medical History  Diagnosis Date  . Arthritis   . Thyroid disease   . Cancer     breast cancer   Past Surgical History  Procedure Laterality Date  . Abdominal hysterectomy    . Tonsillectomy    . Shoulder surgery Bilateral   . Back surgery    . Bilateral carpal tunnel release    . Cholecystectomy     History reviewed. No pertinent family history. History  Substance Use Topics  . Smoking status: Never Smoker   . Smokeless tobacco: Never Used  . Alcohol Use: No   OB History    No data available     Review of Systems  Constitutional: Negative for fever.  Respiratory: Negative for chest tightness and shortness of breath.   Cardiovascular: Negative for chest pain.  Gastrointestinal: Negative for nausea and vomiting.  Musculoskeletal: Negative for back pain and neck pain.  Skin: Positive for wound.  Neurological: Negative for headaches.  Psychiatric/Behavioral: Negative for confusion.  All other systems reviewed and are negative.     Allergies  Morphine and related; Codeine; Blue dyes (parenteral); and Red dye  Home Medications   Prior to Admission medications   Medication Sig Start Date End Date Taking? Authorizing Provider  aspirin 325 MG buffered tablet Take 325 mg by mouth daily.    Yes  Historical Provider, MD  bimatoprost (LUMIGAN) 0.01 % SOLN Place 1 drop into both eyes every other day.    Yes Historical Provider, MD  calcium carbonate (OS-CAL) 600 MG TABS Take 600 mg by mouth 2 (two) times daily with a meal. With D   Yes Historical Provider, MD  co-enzyme Q-10 30 MG capsule Take 30 mg by mouth daily.   Yes Historical Provider, MD  diazepam (VALIUM) 5 MG tablet Take 5 mg by mouth every 6 (six) hours as needed for anxiety.    Yes Historical Provider, MD  fish oil-omega-3 fatty acids 1000 MG capsule Take 1 g by mouth daily.   Yes Historical Provider, MD  HYDROcodone-acetaminophen (NORCO/VICODIN) 5-325 MG per tablet Take 1 tablet by mouth every 6 (six) hours as needed. 05/01/14   Merryl Hacker, MD  levothyroxine (SYNTHROID, LEVOTHROID) 50 MCG tablet Take 1 tablet by mouth daily. 03/13/14  Yes Historical Provider, MD  Multiple Vitamins-Minerals (MULTIVITAMIN WITH MINERALS) tablet Take 1 tablet by mouth daily.     Yes Historical Provider, MD  traMADol (ULTRAM) 50 MG tablet Take 1 tablet by mouth every 8 (eight) hours as needed for moderate pain.  03/18/14  Yes Historical Provider, MD   BP 131/90 mmHg  Pulse 59  Temp(Src) 98.6 F (37 C) (Oral)  Resp 16  SpO2 97% Physical Exam  Constitutional: She is oriented to person, place, and time.  Elderly, appears younger than stated age  HENT:  Head: Normocephalic.  Mouth/Throat: Oropharynx is clear and moist.  1 cm laceration over the occiput with underlying hematoma, bleeding controlled  Eyes: Pupils are equal, round, and reactive to light.  Neck: Normal range of motion. Neck supple.  No midline C-spine tenderness  Cardiovascular: Normal rate, regular rhythm and normal heart sounds.   No murmur heard. Pulmonary/Chest: Effort normal and breath sounds normal. No respiratory distress. She exhibits no tenderness.  Abdominal: Soft. There is no tenderness.  Musculoskeletal:  Normal range of motion to the bilateral knees and hips   Neurological: She is alert and oriented to person, place, and time.  Skin: Skin is warm and dry.  Psychiatric: She has a normal mood and affect.  Nursing note and vitals reviewed.   ED Course  Procedures (including critical care time)  LACERATION REPAIR Performed by: Merryl Hacker Authorized by: Merryl Hacker Consent: Verbal consent obtained. Risks and benefits: risks, benefits and alternatives were discussed Consent given by: patient Patient identity confirmed: provided demographic data Prepped and Draped in normal sterile fashion Wound explored  Laceration Location: scalp  Laceration Length: 1cm  No Foreign Bodies seen or palpated  Anesthesia: local infiltration  Irrigation method: syringe Amount of cleaning: standard  Skin closure: staple  Number of sutures: 1  Technique: interrupted  Patient tolerance: Patient tolerated the procedure well with no immediate complications.  Labs Review Labs Reviewed - No data to display  Imaging Review Ct Head Wo Contrast  05/01/2014   CLINICAL DATA:  Head injury.  Head trauma.Fall.  EXAM: CT HEAD WITHOUT CONTRAST  TECHNIQUE: Contiguous axial images were obtained from the base of the skull through the vertex without intravenous contrast.  COMPARISON:  10/21/2010.  FINDINGS: No mass lesion, mass effect, midline shift, hydrocephalus, hemorrhage. No territorial ischemia or acute infarction. Benign basal ganglia calcifications are present. Intracranial atherosclerosis. Mastoid air cells are clear. Hyperostosis frontalis interna.  IMPRESSION: Negative CT head.   Electronically Signed   By: Dereck Ligas M.D.   On: 05/01/2014 15:00     EKG Interpretation None      MDM   Final diagnoses:  Scalp laceration, initial encounter    Patient presents with isolated head trauma following a fall. Reports a mechanical fall. Denies any neck pain or other injury. Patient nontoxic on exam. Small laceration over the occiput. No  midline C-spine tenderness and C-spine cleared by Nexus criteria.  CT head negative for acute intracranial abnormality. Laceration repaired at the bedside. Tetanus updated. Patient given strict return precautions.  After history, exam, and medical workup I feel the patient has been appropriately medically screened and is safe for discharge home. Pertinent diagnoses were discussed with the patient. Patient was given return precautions.     Merryl Hacker, MD 05/02/14 [phone]

## 2014-05-01 NOTE — ED Notes (Signed)
Pt reports that she fell and hit her head while trying to pull a stake out of her yard.  Denies pain.  Laceration noted to posterior head.  A & Ox4.  Denies LOC and blurred vision.

## 2014-05-01 NOTE — ED Notes (Signed)
Pt escorted to discharge window. Pt verbalized understanding discharge instructions. In no acute distress.  

## 2014-05-01 NOTE — Discharge Instructions (Signed)
Head Injury °You have a head injury. Headaches and throwing up (vomiting) are common after a head injury. It should be easy to wake up from sleeping. Sometimes you must stay in the hospital. Most problems happen within the first 24 hours. Side effects may occur up to 7-10 days after the injury.  °WHAT ARE THE TYPES OF HEAD INJURIES? °Head injuries can be as minor as a bump. Some head injuries can be more severe. More severe head injuries include: °· A jarring injury to the brain (concussion). °· A bruise of the brain (contusion). This mean there is bleeding in the brain that can cause swelling. °· A cracked skull (skull fracture). °· Bleeding in the brain that collects, clots, and forms a bump (hematoma). °WHEN SHOULD I GET HELP RIGHT AWAY?  °· You are confused or sleepy. °· You cannot be woken up. °· You feel sick to your stomach (nauseous) or keep throwing up (vomiting). °· Your dizziness or unsteadiness is getting worse. °· You have very bad, lasting headaches that are not helped by medicine. Take medicines only as told by your doctor. °· You cannot use your arms or legs like normal. °· You cannot walk. °· You notice changes in the black spots in the center of the colored part of your eye (pupil). °· You have clear or bloody fluid coming from your nose or ears. °· You have trouble seeing. °During the next 24 hours after the injury, you must stay with someone who can watch you. This person should get help right away (call 911 in the [place] if you start to shake and are not able to control it (have seizures), you pass out, or you are unable to wake up. °HOW CAN I PREVENT A HEAD INJURY IN THE FUTURE? °· Wear seat belts. °· Wear a helmet while bike riding and playing sports like football. °· Stay away from dangerous activities around the house. °WHEN CAN I RETURN TO NORMAL ACTIVITIES AND ATHLETICS? °See your doctor before doing these activities. You should not do normal activities or play contact sports until 1 week  after the following symptoms have stopped: °· Headache that does not go away. °· Dizziness. °· Poor attention. °· Confusion. °· Memory problems. °· Sickness to your stomach or throwing up. °· Tiredness. °· Fussiness. °· Bothered by bright lights or loud noises. °· Anxiousness or depression. °· Restless sleep. °MAKE SURE YOU:  °· Understand these instructions. °· Will watch your condition. °· Will get help right away if you are not doing well or get worse. °Document Released: 12/06/2007 Document Revised: 05/09/2013 Document Reviewed: 08/30/2012 °ExitCare® Patient Information ©2015 [employer]. This information is not intended to replace advice given to you by your health care provider. Make sure you discuss any questions you have with your health care provider. °Laceration Care, Adult °A laceration is a cut or lesion that goes through all layers of the skin and into the tissue just beneath the skin. °TREATMENT  °Some lacerations may not require closure. Some lacerations may not be able to be closed due to an increased risk of infection. It is important to see your caregiver as soon as possible after an injury to minimize the risk of infection and maximize the opportunity for successful closure. °If closure is appropriate, pain medicines may be given, if needed. The wound will be cleaned to help prevent infection. Your caregiver will use stitches (sutures), staples, wound glue (adhesive), or skin adhesive strips to repair the laceration. These tools bring the   skin edges together to allow for faster healing and a better cosmetic outcome. However, all wounds will heal with a scar. Once the wound has healed, scarring can be minimized by covering the wound with sunscreen during the day for 1 full year. °HOME CARE INSTRUCTIONS  °For sutures or staples: °· Keep the wound clean and dry. °· If you were given a bandage (dressing), you should change it at least once a day. Also, change the dressing if it becomes wet or dirty,  or as directed by your caregiver. °· Wash the wound with soap and water 2 times a day. Rinse the wound off with water to remove all soap. Pat the wound dry with a clean towel. °· After cleaning, apply a thin layer of the antibiotic ointment as recommended by your caregiver. This will help prevent infection and keep the dressing from sticking. °· You may shower as usual after the first 24 hours. Do not soak the wound in water until the sutures are removed. °· Only take over-the-counter or prescription medicines for pain, discomfort, or fever as directed by your caregiver. °· Get your sutures or staples removed as directed by your caregiver. °For skin adhesive strips: °· Keep the wound clean and dry. °· Do not get the skin adhesive strips wet. You may bathe carefully, using caution to keep the wound dry. °· If the wound gets wet, pat it dry with a clean towel. °· Skin adhesive strips will fall off on their own. You may trim the strips as the wound heals. Do not remove skin adhesive strips that are still stuck to the wound. They will fall off in time. °For wound adhesive: °· You may briefly wet your wound in the shower or bath. Do not soak or scrub the wound. Do not swim. Avoid periods of heavy perspiration until the skin adhesive has fallen off on its own. After showering or bathing, gently pat the wound dry with a clean towel. °· Do not apply liquid medicine, cream medicine, or ointment medicine to your wound while the skin adhesive is in place. This may loosen the film before your wound is healed. °· If a dressing is placed over the wound, be careful not to apply tape directly over the skin adhesive. This may cause the adhesive to be pulled off before the wound is healed. °· Avoid prolonged exposure to sunlight or tanning lamps while the skin adhesive is in place. Exposure to ultraviolet light in the first year will darken the scar. °· The skin adhesive will usually remain in place for 5 to 10 days, then naturally  fall off the skin. Do not pick at the adhesive film. °You may need a tetanus shot if: °· You cannot remember when you had your last tetanus shot. °· You have never had a tetanus shot. °If you get a tetanus shot, your arm may swell, get red, and feel warm to the touch. This is common and not a problem. If you need a tetanus shot and you choose not to have one, there is a rare chance of getting tetanus. Sickness from tetanus can be serious. °SEEK MEDICAL CARE IF:  °· You have redness, swelling, or increasing pain in the wound. °· You see a red line that goes away from the wound. °· You have yellowish-white fluid (pus) coming from the wound. °· You have a fever. °· You notice a bad smell coming from the wound or dressing. °· Your wound breaks open before or after sutures have   been removed. °· You notice something coming out of the wound such as wood or glass. °· Your wound is on your hand or foot and you cannot move a finger or toe. °SEEK IMMEDIATE MEDICAL CARE IF:  °· Your pain is not controlled with prescribed medicine. °· You have severe swelling around the wound causing pain and numbness or a change in color in your arm, hand, leg, or foot. °· Your wound splits open and starts bleeding. °· You have worsening numbness, weakness, or loss of function of any joint around or beyond the wound. °· You develop painful lumps near the wound or on the skin anywhere on your body. °MAKE SURE YOU:  °· Understand these instructions. °· Will watch your condition. °· Will get help right away if you are not doing well or get worse. °Document Released: 12/23/2004 Document Revised: 03/17/2011 Document Reviewed: 06/18/2010 °ExitCare® Patient Information ©2015 [employer]. This information is not intended to replace advice given to you by your health care provider. Make sure you discuss any questions you have with your health care provider. ° °

## 2014-05-01 NOTE — ED Notes (Signed)
Pt alert and oriented x4. Respirations even and unlabored, bilateral symmetrical rise and fall of chest. Skin warm and dry. In no acute distress. Denies needs.   

## 2014-05-12 DIAGNOSIS — Z4802 Encounter for removal of sutures: Secondary | ICD-10-CM | POA: Diagnosis not present

## 2014-06-07 DIAGNOSIS — M25511 Pain in right shoulder: Secondary | ICD-10-CM | POA: Diagnosis not present

## 2014-06-07 DIAGNOSIS — M21371 Foot drop, right foot: Secondary | ICD-10-CM | POA: Diagnosis not present

## 2014-06-07 DIAGNOSIS — M4800 Spinal stenosis, site unspecified: Secondary | ICD-10-CM | POA: Diagnosis not present

## 2014-06-07 DIAGNOSIS — S0101XA Laceration without foreign body of scalp, initial encounter: Secondary | ICD-10-CM | POA: Diagnosis not present

## 2014-06-07 DIAGNOSIS — M545 Low back pain: Secondary | ICD-10-CM | POA: Diagnosis not present

## 2014-06-07 DIAGNOSIS — M21372 Foot drop, left foot: Secondary | ICD-10-CM | POA: Diagnosis not present

## 2014-06-12 ENCOUNTER — Other Ambulatory Visit: Payer: Self-pay | Admitting: Nurse Practitioner

## 2014-06-12 DIAGNOSIS — Z853 Personal history of malignant neoplasm of breast: Secondary | ICD-10-CM

## 2014-06-13 ENCOUNTER — Other Ambulatory Visit (HOSPITAL_BASED_OUTPATIENT_CLINIC_OR_DEPARTMENT_OTHER): Payer: Medicare Other

## 2014-06-13 DIAGNOSIS — Z853 Personal history of malignant neoplasm of breast: Secondary | ICD-10-CM | POA: Diagnosis present

## 2014-06-13 LAB — CBC WITH DIFFERENTIAL/PLATELET
BASO%: 0.6 % (ref 0.0–2.0)
Basophils Absolute: 0.1 10*3/uL (ref 0.0–0.1)
EOS%: 5.5 % (ref 0.0–7.0)
Eosinophils Absolute: 0.5 10*3/uL (ref 0.0–0.5)
HEMATOCRIT: 31.9 % — AB (ref 34.8–46.6)
HGB: 10.7 g/dL — ABNORMAL LOW (ref 11.6–15.9)
LYMPH%: 24.1 % (ref 14.0–49.7)
MCH: 30.3 pg (ref 25.1–34.0)
MCHC: 33.5 g/dL (ref 31.5–36.0)
MCV: 90.4 fL (ref 79.5–101.0)
MONO#: 0.7 10*3/uL (ref 0.1–0.9)
MONO%: 8.5 % (ref 0.0–14.0)
NEUT#: 5 10*3/uL (ref 1.5–6.5)
NEUT%: 61.3 % (ref 38.4–76.8)
PLATELETS: 286 10*3/uL (ref 145–400)
RBC: 3.53 10*6/uL — ABNORMAL LOW (ref 3.70–5.45)
RDW: 13.9 % (ref 11.2–14.5)
WBC: 8.2 10*3/uL (ref 3.9–10.3)
lymph#: 2 10*3/uL (ref 0.9–3.3)

## 2014-06-13 LAB — COMPREHENSIVE METABOLIC PANEL (CC13)
ALT: 14 U/L (ref 0–55)
AST: 26 U/L (ref 5–34)
Albumin: 3.5 g/dL (ref 3.5–5.0)
Alkaline Phosphatase: 49 U/L (ref 40–150)
Anion Gap: 9 mEq/L (ref 3–11)
BILIRUBIN TOTAL: 0.42 mg/dL (ref 0.20–1.20)
BUN: 24.7 mg/dL (ref 7.0–26.0)
CALCIUM: 8.7 mg/dL (ref 8.4–10.4)
CO2: 24 meq/L (ref 22–29)
Chloride: 109 mEq/L (ref 98–109)
Creatinine: 1.1 mg/dL (ref 0.6–1.1)
EGFR: 47 mL/min/{1.73_m2} — ABNORMAL LOW (ref >90)
GLUCOSE: 78 mg/dL (ref 70–140)
Potassium: 4.2 mEq/L (ref 3.5–5.1)
Sodium: 142 mEq/L (ref 136–145)
TOTAL PROTEIN: 6.5 g/dL (ref 6.4–8.3)

## 2014-06-14 DIAGNOSIS — Z853 Personal history of malignant neoplasm of breast: Secondary | ICD-10-CM | POA: Diagnosis not present

## 2014-06-15 ENCOUNTER — Telehealth: Payer: Self-pay

## 2014-06-15 NOTE — Telephone Encounter (Signed)
Mammogram results dtd 06/14/14 rcvd from [facility].  Reviewed by Dr. Jana Hakim.  Sent to scan.

## 2014-06-20 ENCOUNTER — Ambulatory Visit (HOSPITAL_BASED_OUTPATIENT_CLINIC_OR_DEPARTMENT_OTHER): Payer: Medicare Other | Admitting: Nurse Practitioner

## 2014-06-20 ENCOUNTER — Encounter: Payer: Self-pay | Admitting: Nurse Practitioner

## 2014-06-20 VITALS — BP 143/59 | HR 57 | Temp 98.1°F | Resp 18 | Ht 60.0 in | Wt 101.1 lb

## 2014-06-20 DIAGNOSIS — Z853 Personal history of malignant neoplasm of breast: Secondary | ICD-10-CM | POA: Diagnosis not present

## 2014-06-20 NOTE — Progress Notes (Signed)
Pt had a fall in her bedroom on May 2nd. Pt went to [facility] ED to get checked out and was fine.

## 2014-06-20 NOTE — Progress Notes (Signed)
ID: Andrea Gilmore   DOB: [birth year]  MR#: [id number]  GEX#:[id number]   PCP:  Mayra Neer, MD GYN: SU:  Magdalene River, MD OTHER:  Joni Fears, MD;  Malena Catholic, MD  CHIEF COMPLAINTS:  1)  Hx of Left Breast Cancer      2)  Hx of Iron Deficiency Anemia   HISTORY OF PRESENT ILLNESS: Andrea Gilmore had a screening mammogram showing suspicious calcifications.  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    The patient saw Dr. Rebekah Chesterfield, had an MRI of the breast on 09/11/2004, and this showed essentially some patchy enhancement in the medial aspect of the left breast corresponding with the prior area of ductal carcinoma in situ.  There were no other lesions.    Accordingly, on 09/18/04, Dr. Rebekah Chesterfield proceeded to lumpectomy with sentinel lymph node excision.  The final report ([id number]), in addition to the ductal carcinoma in situ, showed a 0.8-cm area of invasive ductal carcinoma which was grade 3.  The sentinel lymph node was negative, and the estrogen, progesterone, and HercepTest were all negative on the invasive component of the tumor.  Patient received radiation therapy, completed in 2006, and has been followed since that time with observation alone.  Subsequent history is as detailed below.  INTERVAL HISTORY: Andrea Gilmore returns alone today for follow up for both her remote left breast cancer. The interval history is remarkable for 2 falls in May after losing her balance that both resulted in a head laceration to the same area. She still has the scab from the last incident. She denies vision changes or dizziness, but when she bends over to reach for items, sometimes she cannot control the shift of her weight.    REVIEW OF SYSTEMS: Andrea Gilmore denies fevers, chills, nausea, vomiting, or change in bowel or bladder habits. She has no shortness of breath, chest pain, cough, or palpitations. She has chronic shoulder and back  pain, but this is stable. Occasionally she has hand cramps. She denies depression or anxiety. She not eating as well as she'd like, but she drinks well. A detailed review of systems is otherwise stable.   PAST MEDICAL HISTORY: Past Medical History  Diagnosis Date  . Arthritis   . Thyroid disease   . Cancer     breast cancer   Significant for a remote history of peptic ulcer disease, history of osteopenia, history of hypothyroidism, history of right third digit trigger finger release, a history of arthroscopic right knee surgery, history of trauma to the right foot, history of lumbar laminectomy x 2 under Jetta Lout.  History of right breast biopsy x 2 and a left breast biopsy x 1 previously, all benign.  History of cholecystectomy, history of appendectomy, history of tonsillectomy and adenoidectomy, history of hysterectomy with bilateral salpingo-oophorectomy in 1975 for fibroids, and a history of bilateral rotator cuff surgery under Joni Fears.  PAST SURGICAL HISTORY: Past Surgical History  Procedure Laterality Date  . Abdominal hysterectomy    . Tonsillectomy    . Shoulder surgery Bilateral   . Back surgery    . Bilateral carpal tunnel release    . Cholecystectomy      FAMILY HISTORY No family history on file. The patient's father died from lung cancer at the age of 50.  The patient's mother died from primary brain cancer at the age of 65.  The patient had a brother who died from unknown causes in his late 33s, and a  half sister who is alive.  A full sister died at birth.  GYNECOLOGIC HISTORY: The patient is G2, P2, first pregnancy age 43, menarche age 60.  She never had problems with hot flashes after her hysterectomy and bilateral oophorectomy in 1975 and never took hormone replacement therapy.  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  She is widowed. Her husband  died from lung cancer in the setting of asbestos exposure. She lives by herself.  Her daughter, Andrea Gilmore, works for [employer] here in [place].  Her son, Andrea Gilmore, works for [employer] as an Cytogeneticist, also here in town.  The patient has 2 grandchildren and 2 great grandchildren.     ADVANCED DIRECTIVES:  HEALTH MAINTENANCE:  (Updated 04/14/2013) History  Substance Use Topics  . Smoking status: Never Smoker   . Smokeless tobacco: Never Used  . Alcohol Use: No     Colonoscopy: approx 2010   PAP: Not on file  Bone density: UTD, on Reclast through Dr. Brigitte Pulse  Lipid panel: UTD, Dr. Brigitte Pulse   Allergies  Allergen Reactions  . Morphine And Related Rash  . Codeine Rash  . Blue Dyes (Parenteral) Itching  . Red Dye Itching    Current Outpatient Prescriptions  Medication Sig Dispense Refill  . aspirin 325 MG buffered tablet Take 325 mg by mouth daily.     . bimatoprost (LUMIGAN) 0.01 % SOLN Place 1 drop into both eyes every other day.     . calcium carbonate (OS-CAL) 600 MG TABS Take 600 mg by mouth 2 (two) times daily with a meal. With D    . co-enzyme Q-10 30 MG capsule Take 30 mg by mouth daily.    . fish oil-omega-3 fatty acids 1000 MG capsule Take 1 g by mouth daily.    Marland Kitchen levothyroxine (SYNTHROID, LEVOTHROID) 50 MCG tablet Take 1 tablet by mouth daily.  1  . Multiple Vitamins-Minerals (MULTIVITAMIN WITH MINERALS) tablet Take 1 tablet by mouth daily.      . traMADol (ULTRAM) 50 MG tablet Take 1 tablet by mouth every 8 (eight) hours as needed for moderate pain.   0  . diazepam (VALIUM) 5 MG tablet Take 5 mg by mouth every 6 (six) hours as needed for anxiety.      No current facility-administered medications for this visit.    OBJECTIVE: Elderly white female who appears comfortable and is in no acute distress. Ambulates with a cane for support. Filed Vitals:   06/20/14 1414  BP: 143/59  Pulse: 57  Temp: 98.1 F (36.7 C)  Resp: 18     Body mass index is 19.74 kg/(m^2).     ECOG FS: 1 Filed Weights   06/20/14 1414  Weight: 101 lb 1.6 oz (45.859 kg)   Physical Exam: Skin: warm, dry  HEENT: sclerae anicteric, conjunctivae pink, oropharynx clear. No thrush or mucositis.  Lymph Nodes: No cervical or supraclavicular lymphadenopathy  Lungs: clear to auscultation bilaterally, no rales, wheezes, or rhonci  Heart: regular rate and rhythm  Abdomen: round, soft, non tender, positive bowel sounds  Musculoskeletal: No focal spinal tenderness, no peripheral edema  Neuro: non focal, well oriented, positive affect  Breasts: left breast status post lumpectomy. No evidence of recurrent disease. Left axilla benign. Right breast unremarkable.   LAB RESULTS: Lab Results  Component Value Date   WBC 8.2 06/13/2014   NEUTROABS 5.0 06/13/2014  HGB 10.7* 06/13/2014   HCT 31.9* 06/13/2014   MCV 90.4 06/13/2014   PLT 286 06/13/2014      Chemistry      Component Value Date/Time   NA 142 06/13/2014 1327   NA 142 08/29/2011 1020   K 4.2 06/13/2014 1327   K 4.2 08/29/2011 1020   CL 111* 04/09/2012 1412   CL 109 08/29/2011 1020   CO2 24 06/13/2014 1327   CO2 24 08/29/2011 1020   BUN 24.7 06/13/2014 1327   BUN 24* 08/29/2011 1020   CREATININE 1.1 06/13/2014 1327   CREATININE 0.90 08/29/2011 1020      Component Value Date/Time   CALCIUM 8.7 06/13/2014 1327   CALCIUM 9.1 08/29/2011 1020   ALKPHOS 49 06/13/2014 1327   ALKPHOS 40 08/29/2011 1020   AST 26 06/13/2014 1327   AST 24 08/29/2011 1020   ALT 14 06/13/2014 1327   ALT 13 08/29/2011 1020   BILITOT 0.42 06/13/2014 1327   BILITOT 0.4 08/29/2011 1020      Ferritin  50 04/07/2013   116 10/13/2012   137 04/09/2012    155 11/18/2011   STUDIES: Bilateral mammogram at [facility] on 06/12/2014 was unremarkable.    ASSESSMENT: 81 y.o. [place] woman   (1)  status post left lumpectomy and sentinel lymph node dissection in December 2006 for an 8-mm invasive ductal carcinoma grade 3 triple negative with no  lymph node involvement.    (2)  Status post radiation therapy, completed December 2006.  Now on observation alone with no evidence of disease recurrence.    (3)  Also with a history of iron deficiency anemia, status post IV iron in October 2010 and September 2013.  Negative stool guaiac x3.    PLAN: Andrea Gilmore has done well with regards to her breast cancer. She is now almost 10 years out from her definitive surgery with no evidence of recurrent disease. Her last mammogram was benign. The labs were reviewed in detail and were entirely stable. Dr. Brigitte Pulse will be in charge of her iron deficiency at this point.   At this point Gilmore will be eligible to graduate from follow up visits. We will keep her records on file for the next 10 years, and we are available to her and her PCP if there are any questions or concerns. It was a pleasure to have served this patient.   Laurie Panda, NP    06/20/2014

## 2014-06-23 ENCOUNTER — Telehealth: Payer: Self-pay | Admitting: *Deleted

## 2014-06-23 ENCOUNTER — Telehealth: Payer: Self-pay

## 2014-06-23 ENCOUNTER — Telehealth: Payer: Self-pay | Admitting: Oncology

## 2014-06-23 DIAGNOSIS — Z862 Personal history of diseases of the blood and blood-forming organs and certain disorders involving the immune mechanism: Secondary | ICD-10-CM

## 2014-06-23 NOTE — Telephone Encounter (Signed)
She did not have a ferritin level drawn on 6/14, but was mildly anemic. Set her up for a ferritin check at her convenience. When it returns low we can treat with feraheme.

## 2014-06-23 NOTE — Telephone Encounter (Signed)
Pt called stating she was at [facility] on 6/14 and was found to be anemic. She is asking if she can get an iron or something because she is so tired "she cannot get up or anything".

## 2014-06-23 NOTE — Telephone Encounter (Signed)
Spoke with patient a and she is aware of her 6/20 lab

## 2014-06-23 NOTE — Telephone Encounter (Addendum)
No answer. Pt last saw Heather on 6/14 and was graduated from follow up visits. Last feraheme infusion was 09/04/11.

## 2014-06-23 NOTE — Telephone Encounter (Signed)
TC from patient regarding her fatigue. She states she's "so tired I don't know what to do".  Reviewed notes from previous telephone encounters. TC back to patient and told her we are setting up lab appt for her. She is good to come in Monday around 2pm. POF sent for lab appt. Pt. Voiced understanding.

## 2014-06-26 ENCOUNTER — Other Ambulatory Visit (HOSPITAL_BASED_OUTPATIENT_CLINIC_OR_DEPARTMENT_OTHER): Payer: Medicare Other

## 2014-06-26 DIAGNOSIS — D509 Iron deficiency anemia, unspecified: Secondary | ICD-10-CM

## 2014-06-26 DIAGNOSIS — Z862 Personal history of diseases of the blood and blood-forming organs and certain disorders involving the immune mechanism: Secondary | ICD-10-CM

## 2014-06-26 LAB — FERRITIN CHCC: Ferritin: 61 ng/ml (ref 9–269)

## 2014-07-07 ENCOUNTER — Other Ambulatory Visit: Payer: Self-pay | Admitting: Orthopedic Surgery

## 2014-07-07 DIAGNOSIS — M12812 Other specific arthropathies, not elsewhere classified, left shoulder: Secondary | ICD-10-CM | POA: Diagnosis not present

## 2014-07-12 ENCOUNTER — Other Ambulatory Visit: Payer: Self-pay | Admitting: Orthopedic Surgery

## 2014-07-12 DIAGNOSIS — Z01818 Encounter for other preprocedural examination: Secondary | ICD-10-CM

## 2014-07-14 ENCOUNTER — Ambulatory Visit
Admission: RE | Admit: 2014-07-14 | Discharge: 2014-07-14 | Disposition: A | Discharge status: Home / Self Care | Payer: Medicare Other | Source: Ambulatory Visit | Attending: Orthopedic Surgery | Admitting: Orthopedic Surgery

## 2014-07-14 DIAGNOSIS — M19012 Primary osteoarthritis, left shoulder: Secondary | ICD-10-CM | POA: Diagnosis not present

## 2014-07-14 DIAGNOSIS — Z01818 Encounter for other preprocedural examination: Secondary | ICD-10-CM | POA: Diagnosis not present

## 2014-07-19 ENCOUNTER — Encounter (HOSPITAL_COMMUNITY)
Admission: RE | Admit: 2014-07-19 | Discharge: 2014-07-19 | Disposition: A | Discharge status: Home / Self Care | Payer: Medicare Other | Source: Ambulatory Visit | Attending: Orthopedic Surgery | Admitting: Orthopedic Surgery

## 2014-07-19 ENCOUNTER — Encounter (HOSPITAL_COMMUNITY): Payer: Self-pay

## 2014-07-19 ENCOUNTER — Ambulatory Visit (HOSPITAL_COMMUNITY)
Admission: RE | Admit: 2014-07-19 | Discharge: 2014-07-19 | Disposition: A | Discharge status: Home / Self Care | Payer: Medicare Other | Source: Ambulatory Visit | Attending: Orthopedic Surgery | Admitting: Orthopedic Surgery

## 2014-07-19 DIAGNOSIS — Z853 Personal history of malignant neoplasm of breast: Secondary | ICD-10-CM | POA: Insufficient documentation

## 2014-07-19 DIAGNOSIS — M25519 Pain in unspecified shoulder: Secondary | ICD-10-CM | POA: Diagnosis not present

## 2014-07-19 DIAGNOSIS — I10 Essential (primary) hypertension: Secondary | ICD-10-CM | POA: Diagnosis not present

## 2014-07-19 DIAGNOSIS — J449 Chronic obstructive pulmonary disease, unspecified: Secondary | ICD-10-CM | POA: Diagnosis not present

## 2014-07-19 DIAGNOSIS — M21371 Foot drop, right foot: Secondary | ICD-10-CM | POA: Diagnosis not present

## 2014-07-19 DIAGNOSIS — Z01810 Encounter for preprocedural cardiovascular examination: Secondary | ICD-10-CM | POA: Diagnosis not present

## 2014-07-19 DIAGNOSIS — Z01812 Encounter for preprocedural laboratory examination: Secondary | ICD-10-CM | POA: Insufficient documentation

## 2014-07-19 DIAGNOSIS — G629 Polyneuropathy, unspecified: Secondary | ICD-10-CM | POA: Diagnosis not present

## 2014-07-19 DIAGNOSIS — E46 Unspecified protein-calorie malnutrition: Secondary | ICD-10-CM | POA: Diagnosis not present

## 2014-07-19 DIAGNOSIS — M549 Dorsalgia, unspecified: Secondary | ICD-10-CM | POA: Diagnosis not present

## 2014-07-19 DIAGNOSIS — Z01818 Encounter for other preprocedural examination: Secondary | ICD-10-CM | POA: Insufficient documentation

## 2014-07-19 DIAGNOSIS — E039 Hypothyroidism, unspecified: Secondary | ICD-10-CM | POA: Diagnosis not present

## 2014-07-19 DIAGNOSIS — M4800 Spinal stenosis, site unspecified: Secondary | ICD-10-CM | POA: Diagnosis not present

## 2014-07-19 DIAGNOSIS — M21372 Foot drop, left foot: Secondary | ICD-10-CM | POA: Diagnosis not present

## 2014-07-19 LAB — CBC WITH DIFFERENTIAL/PLATELET
BASOS ABS: 0.1 10*3/uL (ref 0.0–0.1)
Basophils Relative: 1 % (ref 0–1)
EOS ABS: 0.2 10*3/uL (ref 0.0–0.7)
Eosinophils Relative: 3 % (ref 0–5)
HEMATOCRIT: 32.1 % — AB (ref 36.0–46.0)
Hemoglobin: 10.8 g/dL — ABNORMAL LOW (ref 12.0–15.0)
LYMPHS ABS: 2.1 10*3/uL (ref 0.7–4.0)
Lymphocytes Relative: 28 % (ref 12–46)
MCH: 29.3 pg (ref 26.0–34.0)
MCHC: 33.6 g/dL (ref 30.0–36.0)
MCV: 87.2 fL (ref 78.0–100.0)
MONO ABS: 0.7 10*3/uL (ref 0.1–1.0)
Monocytes Relative: 10 % (ref 3–12)
NEUTROS PCT: 58 % (ref 43–77)
Neutro Abs: 4.3 10*3/uL (ref 1.7–7.7)
PLATELETS: 271 10*3/uL (ref 150–400)
RBC: 3.68 MIL/uL — AB (ref 3.87–5.11)
RDW: 13.4 % (ref 11.5–15.5)
WBC: 7.4 10*3/uL (ref 4.0–10.5)

## 2014-07-19 LAB — COMPREHENSIVE METABOLIC PANEL
ALK PHOS: 47 U/L (ref 38–126)
ALT: 19 U/L (ref 14–54)
ANION GAP: 8 (ref 5–15)
AST: 36 U/L (ref 15–41)
Albumin: 4.1 g/dL (ref 3.5–5.0)
BUN: 22 mg/dL — ABNORMAL HIGH (ref 6–20)
CO2: 24 mmol/L (ref 22–32)
Calcium: 9.4 mg/dL (ref 8.9–10.3)
Chloride: 105 mmol/L (ref 101–111)
Creatinine, Ser: 1.29 mg/dL — ABNORMAL HIGH (ref 0.44–1.00)
GFR calc Af Amer: 42 mL/min — ABNORMAL LOW (ref >60)
GFR calc non Af Amer: 36 mL/min — ABNORMAL LOW (ref >60)
Glucose, Bld: 88 mg/dL (ref 65–99)
Potassium: 4 mmol/L (ref 3.5–5.1)
Sodium: 137 mmol/L (ref 135–145)
Total Bilirubin: 0.8 mg/dL (ref 0.3–1.2)
Total Protein: 7.1 g/dL (ref 6.5–8.1)

## 2014-07-19 LAB — URINALYSIS, ROUTINE W REFLEX MICROSCOPIC
BILIRUBIN URINE: NEGATIVE
GLUCOSE, UA: NEGATIVE mg/dL
Hgb urine dipstick: NEGATIVE
KETONES UR: NEGATIVE mg/dL
NITRITE: NEGATIVE
Protein, ur: NEGATIVE mg/dL
Specific Gravity, Urine: 1.01 (ref 1.005–1.030)
UROBILINOGEN UA: 0.2 mg/dL (ref 0.0–1.0)
pH: 5.5 (ref 5.0–8.0)

## 2014-07-19 LAB — URINE MICROSCOPIC-ADD ON

## 2014-07-19 LAB — SURGICAL PCR SCREEN
MRSA, PCR: NEGATIVE
Staphylococcus aureus: NEGATIVE

## 2014-07-19 LAB — PROTIME-INR
INR: 1.02 (ref 0.00–1.49)
Prothrombin Time: 13.6 seconds (ref 11.6–15.2)

## 2014-07-19 LAB — APTT: APTT: 32 s (ref 24–37)

## 2014-07-19 NOTE — Pre-Procedure Instructions (Signed)
    CAYLIE SANDQUIST  07/19/2014      [facility] - [facility], [place] [address] [place] [address] Phone: [phone] Fax: [phone]    Your procedure is scheduled on July 27, 2014.  Report to [facility] Admitting at 5:30 A.M.  Call this number if you have problems the morning of surgery:  [phone]   Remember:  Do not eat food or drink liquids after midnight Wednesday July 26, 2014  Take these medicines the morning of surgery with A SIP OF WATER:  levothyroxine (SYNTHROID, LEVOTHROID)  IF NEEDED: diazepam (VALIUM), traMADol (ULTRAM)   STOP ASPIRIN, ADVIL, IBUPROFEN, DICLOFENAC, FISH OIL, PAIN RELIEVING RUB, HERBAL SUPPLEMENTS ONE WEEK PRIOR TO SURGERY    Do not wear jewelry, make-up or nail polish.  Do not wear lotions, powders, or perfumes.  You may wear deodorant.  Do not shave 48 hours prior to surgery.  Men may shave face and neck.  Do not bring valuables to the hospital.  [facility] is not responsible for any belongings or valuables.  Contacts, dentures or bridgework may not be worn into surgery.  Leave your suitcase in the car.  After surgery it may be brought to your room.  For patients admitted to the hospital, discharge time will be determined by your treatment team.  Patients discharged the day of surgery will not be allowed to drive home.   Name and phone number of your driver:    Special instructions:  "PREPARING FOR SURGERY"  Please read over the following fact sheets that you were given. Pain Booklet, Coughing and Deep Breathing and Surgical Site Infection Prevention

## 2014-07-20 NOTE — Progress Notes (Signed)
Anesthesia Chart Review:  Pt is 81 year old female scheduled for L reverse shoulder arthroplasty on 07/27/2014 with Dr. Tamera Punt.   PMH includes: thyroid disease, breast cancer, arthritis. Never smoker. BMI 20.   Preoperative labs reviewed.    Chest x-ray 07/19/2014 reviewed. COPD. There is no active cardiopulmonary disease.  EKG 07/19/2014: Sinus bradycardia with 1st degree A-V block with PACs. Minimal voltage criteria for LVH, may be normal variant. Possible Anterior infarct, age undetermined. No significant change since last tracing per Dr. Kennon Holter interpretation.   If no changes, I anticipate pt can proceed with surgery as scheduled.   Willeen Cass, FNP-BC [facility]/Anesthesiology Phone: [phone] 07/20/2014 2:25 PM

## 2014-07-26 MED ORDER — CEFAZOLIN SODIUM-DEXTROSE 2-3 GM-% IV SOLR
2.0000 g | INTRAVENOUS | Status: AC
  Administered 2014-07-27: 2 g via INTRAVENOUS

## 2014-07-27 ENCOUNTER — Inpatient Hospital Stay (HOSPITAL_COMMUNITY)
Admission: RE | Admit: 2014-07-27 | Discharge: 2014-08-05 | DRG: 483 | Disposition: A | Discharge status: Skilled Nursing Facility | Payer: Medicare Other | Source: Ambulatory Visit | Attending: Orthopedic Surgery | Admitting: Orthopedic Surgery

## 2014-07-27 ENCOUNTER — Inpatient Hospital Stay (HOSPITAL_COMMUNITY): Payer: Medicare Other | Admitting: Certified Registered Nurse Anesthetist

## 2014-07-27 ENCOUNTER — Inpatient Hospital Stay (HOSPITAL_COMMUNITY): Payer: Medicare Other | Admitting: Emergency Medicine

## 2014-07-27 ENCOUNTER — Encounter (HOSPITAL_COMMUNITY)
Admission: RE | Disposition: A | Discharge status: Skilled Nursing Facility | Payer: Self-pay | Source: Ambulatory Visit | Attending: Orthopedic Surgery

## 2014-07-27 ENCOUNTER — Inpatient Hospital Stay (HOSPITAL_COMMUNITY): Payer: Medicare Other

## 2014-07-27 DIAGNOSIS — E46 Unspecified protein-calorie malnutrition: Secondary | ICD-10-CM | POA: Diagnosis not present

## 2014-07-27 DIAGNOSIS — Z9289 Personal history of other medical treatment: Secondary | ICD-10-CM

## 2014-07-27 DIAGNOSIS — M6281 Muscle weakness (generalized): Secondary | ICD-10-CM | POA: Diagnosis not present

## 2014-07-27 DIAGNOSIS — Z6822 Body mass index (BMI) 22.0-22.9, adult: Secondary | ICD-10-CM

## 2014-07-27 DIAGNOSIS — E872 Acidosis, unspecified: Secondary | ICD-10-CM | POA: Diagnosis present

## 2014-07-27 DIAGNOSIS — R410 Disorientation, unspecified: Secondary | ICD-10-CM | POA: Diagnosis not present

## 2014-07-27 DIAGNOSIS — J90 Pleural effusion, not elsewhere classified: Secondary | ICD-10-CM | POA: Diagnosis not present

## 2014-07-27 DIAGNOSIS — G9341 Metabolic encephalopathy: Secondary | ICD-10-CM | POA: Diagnosis not present

## 2014-07-27 DIAGNOSIS — N3000 Acute cystitis without hematuria: Secondary | ICD-10-CM | POA: Diagnosis not present

## 2014-07-27 DIAGNOSIS — E43 Unspecified severe protein-calorie malnutrition: Secondary | ICD-10-CM | POA: Diagnosis present

## 2014-07-27 DIAGNOSIS — M75102 Unspecified rotator cuff tear or rupture of left shoulder, not specified as traumatic: Secondary | ICD-10-CM | POA: Diagnosis present

## 2014-07-27 DIAGNOSIS — Z7982 Long term (current) use of aspirin: Secondary | ICD-10-CM

## 2014-07-27 DIAGNOSIS — G8918 Other acute postprocedural pain: Secondary | ICD-10-CM | POA: Diagnosis not present

## 2014-07-27 DIAGNOSIS — R748 Abnormal levels of other serum enzymes: Secondary | ICD-10-CM | POA: Diagnosis not present

## 2014-07-27 DIAGNOSIS — Z79899 Other long term (current) drug therapy: Secondary | ICD-10-CM

## 2014-07-27 DIAGNOSIS — J9690 Respiratory failure, unspecified, unspecified whether with hypoxia or hypercapnia: Secondary | ICD-10-CM

## 2014-07-27 DIAGNOSIS — B965 Pseudomonas (aeruginosa) (mallei) (pseudomallei) as the cause of diseases classified elsewhere: Secondary | ICD-10-CM | POA: Diagnosis not present

## 2014-07-27 DIAGNOSIS — D509 Iron deficiency anemia, unspecified: Secondary | ICD-10-CM | POA: Diagnosis present

## 2014-07-27 DIAGNOSIS — I5021 Acute systolic (congestive) heart failure: Secondary | ICD-10-CM | POA: Diagnosis not present

## 2014-07-27 DIAGNOSIS — R278 Other lack of coordination: Secondary | ICD-10-CM | POA: Diagnosis not present

## 2014-07-27 DIAGNOSIS — R1313 Dysphagia, pharyngeal phase: Secondary | ICD-10-CM | POA: Diagnosis not present

## 2014-07-27 DIAGNOSIS — I214 Non-ST elevation (NSTEMI) myocardial infarction: Secondary | ICD-10-CM | POA: Diagnosis present

## 2014-07-27 DIAGNOSIS — N183 Chronic kidney disease, stage 3 unspecified: Secondary | ICD-10-CM | POA: Diagnosis present

## 2014-07-27 DIAGNOSIS — J9601 Acute respiratory failure with hypoxia: Secondary | ICD-10-CM | POA: Diagnosis not present

## 2014-07-27 DIAGNOSIS — Z853 Personal history of malignant neoplasm of breast: Secondary | ICD-10-CM

## 2014-07-27 DIAGNOSIS — R0600 Dyspnea, unspecified: Secondary | ICD-10-CM | POA: Diagnosis not present

## 2014-07-27 DIAGNOSIS — Z96619 Presence of unspecified artificial shoulder joint: Secondary | ICD-10-CM | POA: Insufficient documentation

## 2014-07-27 DIAGNOSIS — E876 Hypokalemia: Secondary | ICD-10-CM | POA: Diagnosis not present

## 2014-07-27 DIAGNOSIS — Z471 Aftercare following joint replacement surgery: Secondary | ICD-10-CM | POA: Diagnosis not present

## 2014-07-27 DIAGNOSIS — I429 Cardiomyopathy, unspecified: Secondary | ICD-10-CM | POA: Insufficient documentation

## 2014-07-27 DIAGNOSIS — R260 Ataxic gait: Secondary | ICD-10-CM | POA: Diagnosis not present

## 2014-07-27 DIAGNOSIS — K219 Gastro-esophageal reflux disease without esophagitis: Secondary | ICD-10-CM | POA: Diagnosis not present

## 2014-07-27 DIAGNOSIS — R000 Tachycardia, unspecified: Secondary | ICD-10-CM | POA: Diagnosis not present

## 2014-07-27 DIAGNOSIS — IMO0001 Reserved for inherently not codable concepts without codable children: Secondary | ICD-10-CM | POA: Diagnosis present

## 2014-07-27 DIAGNOSIS — J810 Acute pulmonary edema: Secondary | ICD-10-CM | POA: Diagnosis present

## 2014-07-27 DIAGNOSIS — D62 Acute posthemorrhagic anemia: Secondary | ICD-10-CM | POA: Diagnosis present

## 2014-07-27 DIAGNOSIS — J449 Chronic obstructive pulmonary disease, unspecified: Secondary | ICD-10-CM | POA: Diagnosis not present

## 2014-07-27 DIAGNOSIS — R509 Fever, unspecified: Secondary | ICD-10-CM | POA: Diagnosis present

## 2014-07-27 DIAGNOSIS — E039 Hypothyroidism, unspecified: Secondary | ICD-10-CM | POA: Diagnosis present

## 2014-07-27 DIAGNOSIS — R739 Hyperglycemia, unspecified: Secondary | ICD-10-CM | POA: Diagnosis present

## 2014-07-27 DIAGNOSIS — Z4659 Encounter for fitting and adjustment of other gastrointestinal appliance and device: Secondary | ICD-10-CM

## 2014-07-27 DIAGNOSIS — Z4682 Encounter for fitting and adjustment of non-vascular catheter: Secondary | ICD-10-CM | POA: Diagnosis not present

## 2014-07-27 DIAGNOSIS — N390 Urinary tract infection, site not specified: Secondary | ICD-10-CM | POA: Diagnosis present

## 2014-07-27 DIAGNOSIS — J811 Chronic pulmonary edema: Secondary | ICD-10-CM | POA: Diagnosis not present

## 2014-07-27 DIAGNOSIS — K449 Diaphragmatic hernia without obstruction or gangrene: Secondary | ICD-10-CM | POA: Diagnosis not present

## 2014-07-27 DIAGNOSIS — M19012 Primary osteoarthritis, left shoulder: Secondary | ICD-10-CM | POA: Diagnosis not present

## 2014-07-27 DIAGNOSIS — I129 Hypertensive chronic kidney disease with stage 1 through stage 4 chronic kidney disease, or unspecified chronic kidney disease: Secondary | ICD-10-CM | POA: Diagnosis present

## 2014-07-27 DIAGNOSIS — F419 Anxiety disorder, unspecified: Secondary | ICD-10-CM | POA: Diagnosis not present

## 2014-07-27 DIAGNOSIS — A419 Sepsis, unspecified organism: Secondary | ICD-10-CM | POA: Diagnosis not present

## 2014-07-27 DIAGNOSIS — G9340 Encephalopathy, unspecified: Secondary | ICD-10-CM | POA: Diagnosis not present

## 2014-07-27 DIAGNOSIS — R7989 Other specified abnormal findings of blood chemistry: Secondary | ICD-10-CM | POA: Diagnosis not present

## 2014-07-27 DIAGNOSIS — M25512 Pain in left shoulder: Secondary | ICD-10-CM | POA: Diagnosis not present

## 2014-07-27 DIAGNOSIS — R0902 Hypoxemia: Secondary | ICD-10-CM | POA: Diagnosis not present

## 2014-07-27 DIAGNOSIS — M12812 Other specific arthropathies, not elsewhere classified, left shoulder: Secondary | ICD-10-CM | POA: Diagnosis present

## 2014-07-27 DIAGNOSIS — Z66 Do not resuscitate: Secondary | ICD-10-CM | POA: Diagnosis not present

## 2014-07-27 DIAGNOSIS — N179 Acute kidney failure, unspecified: Secondary | ICD-10-CM | POA: Diagnosis not present

## 2014-07-27 DIAGNOSIS — Z96612 Presence of left artificial shoulder joint: Secondary | ICD-10-CM

## 2014-07-27 DIAGNOSIS — Z452 Encounter for adjustment and management of vascular access device: Secondary | ICD-10-CM | POA: Diagnosis not present

## 2014-07-27 DIAGNOSIS — R0602 Shortness of breath: Secondary | ICD-10-CM | POA: Diagnosis not present

## 2014-07-27 DIAGNOSIS — J690 Pneumonitis due to inhalation of food and vomit: Secondary | ICD-10-CM | POA: Diagnosis not present

## 2014-07-27 DIAGNOSIS — E038 Other specified hypothyroidism: Secondary | ICD-10-CM | POA: Diagnosis present

## 2014-07-27 DIAGNOSIS — Z862 Personal history of diseases of the blood and blood-forming organs and certain disorders involving the immune mechanism: Secondary | ICD-10-CM

## 2014-07-27 DIAGNOSIS — J948 Other specified pleural conditions: Secondary | ICD-10-CM | POA: Diagnosis not present

## 2014-07-27 HISTORY — PX: REVERSE SHOULDER ARTHROPLASTY: SHX5054

## 2014-07-27 SURGERY — ARTHROPLASTY, SHOULDER, TOTAL, REVERSE
Anesthesia: General | Site: Shoulder | Laterality: Left

## 2014-07-27 MED ORDER — PHENYLEPHRINE HCL 10 MG/ML IJ SOLN
INTRAMUSCULAR | Status: DC | PRN
  Administered 2014-07-27 (×3): 80 ug via INTRAVENOUS
  Administered 2014-07-27: 120 ug via INTRAVENOUS
  Administered 2014-07-27 (×8): 40 ug via INTRAVENOUS
  Administered 2014-07-27: 30 ug via INTRAVENOUS
  Administered 2014-07-27: 50 ug via INTRAVENOUS

## 2014-07-27 MED ORDER — BISACODYL 5 MG PO TBEC: 5.0000 mg | DELAYED_RELEASE_TABLET | Freq: Every day | ORAL | Status: DC | PRN

## 2014-07-27 MED ORDER — FENTANYL CITRATE (PF) 100 MCG/2ML IJ SOLN
INTRAMUSCULAR | Status: DC | PRN
  Administered 2014-07-27: 100 ug via INTRAVENOUS

## 2014-07-27 MED ORDER — EPHEDRINE SULFATE 50 MG/ML IJ SOLN
INTRAMUSCULAR | Status: DC | PRN
  Administered 2014-07-27 (×4): 5 mg via INTRAVENOUS

## 2014-07-27 MED ORDER — ASPIRIN EC 325 MG PO TBEC
325.0000 mg | DELAYED_RELEASE_TABLET | Freq: Two times a day (BID) | ORAL | Status: DC
  Administered 2014-07-27: 325 mg via ORAL
  Filled 2014-07-27 (×3): qty 1

## 2014-07-27 MED ORDER — ACETAMINOPHEN 650 MG RE SUPP
650.0000 mg | Freq: Four times a day (QID) | RECTAL | Status: DC | PRN
  Administered 2014-07-28: 650 mg via RECTAL
  Filled 2014-07-27: qty 1

## 2014-07-27 MED ORDER — SCOPOLAMINE 1 MG/3DAYS TD PT72
MEDICATED_PATCH | TRANSDERMAL | Status: AC
  Filled 2014-07-27: qty 1

## 2014-07-27 MED ORDER — HYDROMORPHONE HCL 1 MG/ML IJ SOLN: 0.2500 mg | INTRAMUSCULAR | Status: DC | PRN

## 2014-07-27 MED ORDER — DIPHENHYDRAMINE HCL 12.5 MG/5ML PO ELIX
12.5000 mg | ORAL_SOLUTION | ORAL | Status: DC | PRN
  Filled 2014-07-27: qty 10

## 2014-07-27 MED ORDER — CEFAZOLIN SODIUM 1-5 GM-% IV SOLN
1.0000 g | Freq: Four times a day (QID) | INTRAVENOUS | Status: DC
  Administered 2014-07-27 (×2): 1 g via INTRAVENOUS
  Filled 2014-07-27 (×3): qty 50

## 2014-07-27 MED ORDER — ROPIVACAINE HCL 5 MG/ML IJ SOLN
INTRAMUSCULAR | Status: DC | PRN
  Administered 2014-07-27: 20 mg via PERINEURAL

## 2014-07-27 MED ORDER — PROMETHAZINE HCL 25 MG/ML IJ SOLN: 6.2500 mg | INTRAMUSCULAR | Status: DC | PRN

## 2014-07-27 MED ORDER — ALUM & MAG HYDROXIDE-SIMETH 200-200-20 MG/5ML PO SUSP: 30.0000 mL | ORAL | Status: DC | PRN

## 2014-07-27 MED ORDER — LACTATED RINGERS IV SOLN
INTRAVENOUS | Status: DC | PRN
  Administered 2014-07-27 (×2): via INTRAVENOUS

## 2014-07-27 MED ORDER — PROPOFOL 10 MG/ML IV BOLUS: INTRAVENOUS | Status: DC | PRN

## 2014-07-27 MED ORDER — ONDANSETRON HCL 4 MG/2ML IJ SOLN
4.0000 mg | Freq: Four times a day (QID) | INTRAMUSCULAR | Status: DC | PRN
  Administered 2014-08-01: 4 mg via INTRAVENOUS
  Filled 2014-07-27: qty 2

## 2014-07-27 MED ORDER — ALBUMIN HUMAN 5 % IV SOLN
INTRAVENOUS | Status: DC | PRN
  Administered 2014-07-27: 08:00:00 via INTRAVENOUS

## 2014-07-27 MED ORDER — SODIUM CHLORIDE 0.9 % IR SOLN
Status: DC | PRN
  Administered 2014-07-27: 1000 mL

## 2014-07-27 MED ORDER — DEXAMETHASONE SODIUM PHOSPHATE 4 MG/ML IJ SOLN
INTRAMUSCULAR | Status: DC | PRN
  Administered 2014-07-27: 4 mg via INTRAVENOUS

## 2014-07-27 MED ORDER — ONDANSETRON HCL 4 MG/2ML IJ SOLN
INTRAMUSCULAR | Status: DC | PRN
  Administered 2014-07-27: 4 mg via INTRAVENOUS

## 2014-07-27 MED ORDER — PROPOFOL 10 MG/ML IV BOLUS
INTRAVENOUS | Status: AC
  Filled 2014-07-27: qty 20

## 2014-07-27 MED ORDER — DIAZEPAM 5 MG PO TABS
5.0000 mg | ORAL_TABLET | Freq: Four times a day (QID) | ORAL | Status: DC | PRN
  Administered 2014-07-27 – 2014-07-28 (×2): 5 mg via ORAL
  Filled 2014-07-27 (×2): qty 1

## 2014-07-27 MED ORDER — SCOPOLAMINE 1 MG/3DAYS TD PT72
1.0000 | MEDICATED_PATCH | Freq: Once | TRANSDERMAL | Status: AC
  Administered 2014-07-27: 1 via TRANSDERMAL

## 2014-07-27 MED ORDER — DOCUSATE SODIUM 100 MG PO CAPS
100.0000 mg | ORAL_CAPSULE | Freq: Two times a day (BID) | ORAL | Status: DC
Start: 2014-07-27 — End: 2014-07-28
  Administered 2014-07-27: 100 mg via ORAL
  Filled 2014-07-27: qty 1

## 2014-07-27 MED ORDER — PHENOL 1.4 % MT LIQD: 1.0000 | OROMUCOSAL | Status: DC | PRN

## 2014-07-27 MED ORDER — POTASSIUM CHLORIDE IN NACL 20-0.45 MEQ/L-% IV SOLN
INTRAVENOUS | Status: DC
  Administered 2014-07-27: 100 mL/h via INTRAVENOUS
  Filled 2014-07-27 (×4): qty 1000

## 2014-07-27 MED ORDER — SODIUM CHLORIDE 0.9 % IR SOLN
Status: DC | PRN
  Administered 2014-07-27: 3000 mL

## 2014-07-27 MED ORDER — LEVOTHYROXINE SODIUM 50 MCG PO TABS
50.0000 ug | ORAL_TABLET | Freq: Every day | ORAL | Status: DC
  Filled 2014-07-27: qty 1

## 2014-07-27 MED ORDER — FLEET ENEMA 7-19 GM/118ML RE ENEM: 1.0000 | ENEMA | Freq: Once | RECTAL | Status: AC | PRN

## 2014-07-27 MED ORDER — MORPHINE SULFATE 2 MG/ML IJ SOLN
1.0000 mg | INTRAMUSCULAR | Status: DC | PRN
  Administered 2014-07-28: 1 mg via INTRAVENOUS
  Filled 2014-07-27: qty 1

## 2014-07-27 MED ORDER — FENTANYL CITRATE (PF) 250 MCG/5ML IJ SOLN
INTRAMUSCULAR | Status: AC
  Filled 2014-07-27: qty 5

## 2014-07-27 MED ORDER — OXYCODONE HCL 5 MG PO TABS
5.0000 mg | ORAL_TABLET | ORAL | Status: DC | PRN
  Administered 2014-07-27 – 2014-07-28 (×2): 5 mg via ORAL
  Filled 2014-07-27 (×4): qty 1

## 2014-07-27 MED ORDER — ACETAMINOPHEN 325 MG PO TABS
650.0000 mg | ORAL_TABLET | Freq: Four times a day (QID) | ORAL | Status: DC | PRN
  Administered 2014-07-30 – 2014-08-05 (×5): 650 mg via ORAL
  Filled 2014-07-27 (×5): qty 2

## 2014-07-27 MED ORDER — ONDANSETRON HCL 4 MG PO TABS: 4.0000 mg | ORAL_TABLET | Freq: Four times a day (QID) | ORAL | Status: DC | PRN

## 2014-07-27 MED ORDER — ROCURONIUM BROMIDE 100 MG/10ML IV SOLN
INTRAVENOUS | Status: DC | PRN
  Administered 2014-07-27: 40 mg via INTRAVENOUS

## 2014-07-27 MED ORDER — POLYETHYLENE GLYCOL 3350 17 G PO PACK
17.0000 g | PACK | Freq: Every day | ORAL | Status: DC | PRN
  Filled 2014-07-27: qty 1

## 2014-07-27 MED ORDER — PHENYLEPHRINE HCL 10 MG/ML IJ SOLN
10.0000 mg | INTRAVENOUS | Status: DC | PRN
  Administered 2014-07-27: 30 ug/min via INTRAVENOUS

## 2014-07-27 MED ORDER — MENTHOL 3 MG MT LOZG: 1.0000 | LOZENGE | OROMUCOSAL | Status: DC | PRN

## 2014-07-27 MED ORDER — NEOSTIGMINE METHYLSULFATE 10 MG/10ML IV SOLN
INTRAVENOUS | Status: DC | PRN
  Administered 2014-07-27: 3 mg via INTRAVENOUS
  Administered 2014-07-27: 1 mg via INTRAVENOUS

## 2014-07-27 MED ORDER — METOCLOPRAMIDE HCL 5 MG PO TABS
5.0000 mg | ORAL_TABLET | Freq: Three times a day (TID) | ORAL | Status: DC | PRN
  Filled 2014-07-27: qty 2

## 2014-07-27 MED ORDER — ACETAMINOPHEN 500 MG PO TABS
1000.0000 mg | ORAL_TABLET | Freq: Four times a day (QID) | ORAL | Status: DC
  Administered 2014-07-27 (×3): 1000 mg via ORAL
  Filled 2014-07-27 (×3): qty 2

## 2014-07-27 MED ORDER — GLYCOPYRROLATE 0.2 MG/ML IJ SOLN
INTRAMUSCULAR | Status: DC | PRN
  Administered 2014-07-27: 0.2 mg via INTRAVENOUS
  Administered 2014-07-27: 0.4 mg via INTRAVENOUS
  Administered 2014-07-27: 0.2 mg via INTRAVENOUS

## 2014-07-27 MED ORDER — PROPOFOL 10 MG/ML IV BOLUS
INTRAVENOUS | Status: DC | PRN
  Administered 2014-07-27: 130 mg via INTRAVENOUS

## 2014-07-27 MED ORDER — METOCLOPRAMIDE HCL 5 MG/ML IJ SOLN
5.0000 mg | Freq: Three times a day (TID) | INTRAMUSCULAR | Status: DC | PRN
  Filled 2014-07-27: qty 2

## 2014-07-27 SURGICAL SUPPLY — 74 items
BLADE SAW SAG 73X25 THK (BLADE) ×2
BLADE SAW SGTL 73X25 THK (BLADE) ×1 IMPLANT
BLADE SURG 15 STRL LF DISP TIS (BLADE) ×1 IMPLANT
BLADE SURG 15 STRL SS (BLADE) ×3
BOWL SMART MIX CTS (DISPOSABLE) IMPLANT
CAPT SHLDR REVTOTAL 2 ALTIVATE ×3 IMPLANT
CHLORAPREP W/TINT 26ML (MISCELLANEOUS) ×3 IMPLANT
CLOSURE WOUND 1/2 X4 (GAUZE/BANDAGES/DRESSINGS) ×1
COVER MAYO STAND STRL (DRAPES) IMPLANT
COVER SURGICAL LIGHT HANDLE (MISCELLANEOUS) ×3 IMPLANT
DRAPE INCISE IOBAN 66X45 STRL (DRAPES) ×6 IMPLANT
DRAPE ORTHO SPLIT 77X108 STRL (DRAPES) ×6
DRAPE SURG 17X23 STRL (DRAPES) ×3 IMPLANT
DRAPE SURG ORHT 6 SPLT 77X108 (DRAPES) ×2 IMPLANT
DRAPE U-SHAPE 47X51 STRL (DRAPES) ×3 IMPLANT
DRILL BIT 5/64 (BIT) ×3 IMPLANT
DRSG AQUACEL AG ADV 3.5X10 (GAUZE/BANDAGES/DRESSINGS) ×2 IMPLANT
ELECT BLADE 4.0 EZ CLEAN MEGAD (MISCELLANEOUS) ×3
ELECT REM PT RETURN 9FT ADLT (ELECTROSURGICAL) ×3
ELECTRODE BLDE 4.0 EZ CLN MEGD (MISCELLANEOUS) ×1 IMPLANT
ELECTRODE REM PT RTRN 9FT ADLT (ELECTROSURGICAL) ×1 IMPLANT
EVACUATOR 1/8 PVC DRAIN (DRAIN) IMPLANT
GLOVE BIO SURGEON STRL SZ7 (GLOVE) ×3 IMPLANT
GLOVE BIO SURGEON STRL SZ7.5 (GLOVE) ×3 IMPLANT
GLOVE BIOGEL PI IND STRL 7.0 (GLOVE) ×1 IMPLANT
GLOVE BIOGEL PI IND STRL 8 (GLOVE) ×1 IMPLANT
GLOVE BIOGEL PI INDICATOR 7.0 (GLOVE) ×2
GLOVE BIOGEL PI INDICATOR 8 (GLOVE) ×2
GOWN STRL REUS W/ TWL LRG LVL3 (GOWN DISPOSABLE) ×3 IMPLANT
GOWN STRL REUS W/ TWL XL LVL3 (GOWN DISPOSABLE) ×1 IMPLANT
GOWN STRL REUS W/TWL LRG LVL3 (GOWN DISPOSABLE) ×9
GOWN STRL REUS W/TWL XL LVL3 (GOWN DISPOSABLE) ×3
HANDPIECE INTERPULSE COAX TIP (DISPOSABLE) ×3
HOOD PEEL AWAY FACE SHEILD DIS (HOOD) ×6 IMPLANT
KIT BASIN OR (CUSTOM PROCEDURE TRAY) ×3 IMPLANT
KIT ROOM TURNOVER OR (KITS) ×3 IMPLANT
MANIFOLD NEPTUNE II (INSTRUMENTS) ×3 IMPLANT
NDL HYPO 25GX1X1/2 BEV (NEEDLE) IMPLANT
NDL MAYO TROCAR (NEEDLE) IMPLANT
NEEDLE HYPO 25GX1X1/2 BEV (NEEDLE) IMPLANT
NEEDLE MAYO TROCAR (NEEDLE) ×3 IMPLANT
NOZZLE PRISM 8.5MM (MISCELLANEOUS) IMPLANT
NS IRRIG 1000ML POUR BTL (IV SOLUTION) ×3 IMPLANT
PACK SHOULDER (CUSTOM PROCEDURE TRAY) ×3 IMPLANT
PAD ARMBOARD 7.5X6 YLW CONV (MISCELLANEOUS) ×6 IMPLANT
RETRIEVER SUT HEWSON (MISCELLANEOUS) ×3 IMPLANT
RSP BONE SCREW-LOCKING SZ 5.0MM 14MM LONG ×2 IMPLANT
SCREW BONE LOCKING RSP 5.0X14 (Screw) ×3 IMPLANT
SCREW BONE RSP LOCK 5X14 (Screw) IMPLANT
SET HNDPC FAN SPRY TIP SCT (DISPOSABLE) ×1 IMPLANT
SLING ARM IMMOBILIZER LRG (SOFTGOODS) ×1 IMPLANT
SLING ARM IMMOBILIZER MED (SOFTGOODS) ×2 IMPLANT
SPONGE LAP 18X18 X RAY DECT (DISPOSABLE) ×3 IMPLANT
SPONGE LAP 4X18 X RAY DECT (DISPOSABLE) ×2 IMPLANT
STRIP CLOSURE SKIN 1/2X4 (GAUZE/BANDAGES/DRESSINGS) ×2 IMPLANT
SUCTION FRAZIER TIP 10 FR DISP (SUCTIONS) ×3 IMPLANT
SUPPORT WRAP ARM LG (MISCELLANEOUS) ×3 IMPLANT
SUT ETHIBOND NAB CT1 #1 30IN (SUTURE) ×4 IMPLANT
SUT FIBERWIRE #2 38 T-5 BLUE (SUTURE)
SUT MNCRL AB 4-0 PS2 18 (SUTURE) ×3 IMPLANT
SUT SILK 2 0 TIES 17X18 (SUTURE)
SUT SILK 2-0 18XBRD TIE BLK (SUTURE) IMPLANT
SUT VIC AB 0 CTB1 27 (SUTURE) ×3 IMPLANT
SUT VIC AB 2-0 CT1 27 (SUTURE) ×3
SUT VIC AB 2-0 CT1 TAPERPNT 27 (SUTURE) ×1 IMPLANT
SUTURE FIBERWR #2 38 T-5 BLUE (SUTURE) IMPLANT
SYR CONTROL 10ML LL (SYRINGE) IMPLANT
SYRINGE TOOMEY DISP (SYRINGE) IMPLANT
SYS SHLDR CAPITATED REV 2 IMPLANT
TAPE FIBER 2MM 7IN #2 BLUE (SUTURE) IMPLANT
TOWEL OR 17X24 6PK STRL BLUE (TOWEL DISPOSABLE) ×3 IMPLANT
TOWEL OR 17X26 10 PK STRL BLUE (TOWEL DISPOSABLE) ×3 IMPLANT
WATER STERILE IRR 1000ML POUR (IV SOLUTION) ×1 IMPLANT
YANKAUER SUCT BULB TIP NO VENT (SUCTIONS) ×2 IMPLANT

## 2014-07-27 NOTE — Anesthesia Postprocedure Evaluation (Signed)
Anesthesia Post Note  Patient: Andrea Gilmore  Procedure(s) Performed: Procedure(s) (LRB): REVERSE SHOULDER ARTHROPLASTY (Left)  Anesthesia type: general  Patient location: PACU  Post pain: Pain level controlled  Post assessment: Patient's Cardiovascular Status Stable  Last Vitals:  Filed Vitals:   07/27/14 1000  BP: 121/42  Pulse: 59  Temp: 36.5 C  Resp: 26    Post vital signs: Reviewed and stable  Level of consciousness: sedated  Complications: No apparent anesthesia complications

## 2014-07-27 NOTE — Anesthesia Procedure Notes (Addendum)
Anesthesia Regional Block:  Interscalene brachial plexus block  Pre-Anesthetic Checklist: ,, timeout performed, Correct Patient, Correct Site, Correct Laterality, Correct Procedure, Correct Position, site marked, Risks and benefits discussed,  Surgical consent,  Pre-op evaluation,  At surgeon's request and post-op pain management  Laterality: Left  Prep: chloraprep       Needles:  Injection technique: Single-shot  Needle Type: Echogenic Stimulator Needle     Needle Length: 5cm 5 cm Needle Gauge: 22 and 22 G    Additional Needles:  Procedures: ultrasound guided (picture in chart) and nerve stimulator Interscalene brachial plexus block  Nerve Stimulator or Paresthesia:  Response: bicep contraction, 0.45 mA,   Additional Responses:   Narrative:  Start time: 07/27/2014 7:02 AM End time: 07/27/2014 7:12 AM Injection made incrementally with aspirations every 5 mL.  Performed by: Personally  Anesthesiologist: Duane Boston  Additional Notes: Functioning IV was confirmed and monitors applied.  A 27mm 22ga echogenic arrow stimulator was used. Sterile prep and drape,hand hygiene and sterile gloves were used.Ultrasound guidance: relevant anatomy identified, needle position confirmed, local anesthetic spread visualized around nerve(s)., vascular puncture avoided.  Image printed for medical record.  Negative aspiration and negative test dose prior to incremental administration of local anesthetic. The patient tolerated the procedure well.   Procedure Name: Intubation Date/Time: 07/27/2014 7:41 AM Performed by: Shirlyn Goltz Pre-anesthesia Checklist: Patient identified, Emergency Drugs available, Suction available and Patient being monitored Patient Re-evaluated:Patient Re-evaluated prior to inductionOxygen Delivery Method: Circle system utilized Preoxygenation: Pre-oxygenation with 100% oxygen Intubation Type: IV induction Ventilation: Mask ventilation without difficulty and Oral  airway inserted - appropriate to patient size Laryngoscope Size: Sabra Heck and 2 Grade View: Grade I Tube type: Oral Tube size: 7.0 mm Number of attempts: 1 Airway Equipment and Method: Stylet Placement Confirmation: ETT inserted through vocal cords under direct vision,  positive ETCO2 and breath sounds checked- equal and bilateral Secured at: 21 cm Tube secured with: Tape Dental Injury: Teeth and Oropharynx as per pre-operative assessment

## 2014-07-27 NOTE — Op Note (Signed)
Procedure(s): REVERSE SHOULDER ARTHROPLASTY Procedure Note  Andrea Gilmore female 81 y.o. 07/27/2014  Procedure(s) and Anesthesia Type:    *LEFT REVERSE SHOULDER ARTHROPLASTY - Choice   Indications:  81 y.o. female  With endstage left shoulder arthritis with irrepairable rotator cuff tear. Pain and dysfunction interfered with quality of life and nonoperative treatment with activity modification, NSAIDS and injections failed.     Surgeon: Nita Sells   Assistants: Jeanmarie Hubert PA-C [facility] was present and scrubbed throughout the procedure and was essential in positioning, retraction, exposure, and closure)  Anesthesia: General endotracheal anesthesia with preoperative interscalene block given by the attending anesthesiologist    Procedure Detail  REVERSE SHOULDER ARTHROPLASTY   Estimated Blood Loss:  200 mL         Drains: none  Blood Given: none          Specimens: none        Complications:  * No complications entered in OR log *         Disposition: PACU - hemodynamically stable.         Condition: stable      OPERATIVE FINDINGS:  A DJO Altivate pressfit reverse total shoulder arthroplasty was placed with a  size 8 stem, a 32-4 glenosphere, and a standard poly insert. The base plate  fixation was excellent.  PROCEDURE: The patient was identified in the preoperative holding area  where I personally marked the operative site after verifying site, side,  and procedure with the patient. An interscalene block given by  the attending anesthesiologist in the holding area and the patient was taken back to the operating room where all extremities were  carefully padded in position after general anesthesia was induced. She  was placed in a beach-chair position and the operative upper extremity was  prepped and draped in a standard sterile fashion. An approximately 10-  cm incision was made from the tip of the coracoid process to the center  point  of the humerus at the level of the axilla. Dissection was carried  down through subcutaneous tissues to the level of the cephalic vein  which was taken laterally with the deltoid. The pectoralis major was  retracted medially. The subdeltoid space was developed and the lateral  edge of the conjoined tendon was identified. The undersurface of  conjoined tendon was palpated and the musculocutaneous nerve was not in  the field. Retractor was placed underneath the conjoined and second  retractor was placed lateral into the deltoid. There was a large pseudocapsule which was excised with a significant amount of benign-appearing fluid. Biceps was noted to be torn. Subscapularis was noted to be completely torn. The  joint was then gently externally rotated while the capsule was released  from the humeral neck around to just beyond the 6 o'clock position. At  this point, the joint was dislocated and the humeral head was presented  into the wound. The excessive osteophyte formation was removed with a  large rongeur.  The cutting guide was used to make the appropriate  head cut and the head was saved for potentially bone grafting.  The glenoid was exposed with the arm in an  abducted extended position. The anterior and posterior labrum were  completely excised and the capsule was released circumferentially to  allow for exposure of the glenoid for preparation. The 2.5 mm drill was  placed using the guide in 5-10 inferior angulation and the tap was then advanced in the same hole. Small and large reamers  were then used. The tap was then removed and the Metaglene was then screwed in with excellent purchase.  The peripheral guide was then used to drilled measured and filled peripheral locking screws. The size  32-4  glenosphere was then impacted on the [facility] [employer] [facility] taper and the central screw was placed. The humerus was then again exposed and the diaphyseal reamers were used followed by the metaphyseal reamers. The final  broach was left in place in the proximal trial was placed. The joint was reduced and with this implant it was felt that soft tissue tensioning was appropriate with excellent stability and excellent range of motion. Therefore, final humeral stem was placed press-fit with bone grafting.  And then the trial polyethylene inserts were tested again and the above implant was felt to be the most appropriate for final insertion. The joint was reduced taken through full range of motion and felt to be stable. Soft tissue tension was appropriate.  The joint was then copiously irrigated with pulse  lavage and the wound was then closed. The subscapularis could not be repaired.  Skin was closed with 2-0 Vicryl in a deep dermal layer and 4-0  Monocryl for skin closure. Steri-Strips were applied. Sterile  dressings were then applied as well as a sling. The patient was allowed  to awaken from general anesthesia, transferred to stretcher, and taken  to recovery room in stable condition.   POSTOPERATIVE PLAN: The patient will be kept in the hospital postoperatively  for pain control and therapy.

## 2014-07-27 NOTE — Progress Notes (Signed)
Utilization review completed.  

## 2014-07-27 NOTE — H&P (Signed)
Andrea Gilmore is an 81 y.o. female.   Chief Complaint: L shoulder pain and dysfunction HPI: L shoulder endstage bone on bone rotator cuff tear arthropathy, failed nonoperative treatment with activity modification, medication and injections.  Past Medical History  Diagnosis Date  . Arthritis   . Thyroid disease   . Cancer     breast cancer    Past Surgical History  Procedure Laterality Date  . Abdominal hysterectomy    . Tonsillectomy    . Shoulder surgery Bilateral   . Back surgery    . Bilateral carpal tunnel release    . Cholecystectomy    . Eye surgery Right     No family history on file. Social History:  reports that she has never smoked. She has never used smokeless tobacco. She reports that she does not drink alcohol or use illicit drugs.  Allergies:  Allergies  Allergen Reactions  . Morphine And Related Rash  . Codeine Rash  . Blue Dyes (Parenteral) Itching  . Red Dye Itching    Medications Prior to Admission  Medication Sig Dispense Refill  . aspirin 325 MG buffered tablet Take 325 mg by mouth daily.     . bimatoprost (LUMIGAN) 0.01 % SOLN Place 1 drop into both eyes every other day.     . Calcium Carbonate-Vitamin D (CALCIUM + D PO) Take 1 tablet by mouth daily.    . diazepam (VALIUM) 5 MG tablet Take 5 mg by mouth every 6 (six) hours as needed for anxiety.     . fish oil-omega-3 fatty acids 1000 MG capsule Take 1 g by mouth daily.    . Glucos-Chondroit-Hyaluron-MSM (GLUCOSAMINE CHONDROITIN JOINT) TABS Take 1 tablet by mouth daily.    Marland Kitchen HYDROcodone-acetaminophen (NORCO/VICODIN) 5-325 MG per tablet Take 0.5-1 tablets by mouth every 6 (six) hours as needed for moderate pain.    Marland Kitchen levothyroxine (SYNTHROID, LEVOTHROID) 50 MCG tablet Take 1 tablet by mouth daily.  1  . Menthol-Methyl Salicylate (PAIN RELIEVING RUB EX) Apply 1 application topically as needed (pain).    . Multiple Vitamins-Minerals (MULTIVITAMIN WITH MINERALS) tablet Take 1 tablet by mouth daily.       Marland Kitchen OVER THE COUNTER MEDICATION Take 1 tablet by mouth daily. Brain & Liver Vitamin    . diclofenac sodium (VOLTAREN) 1 % GEL Apply 1 application topically 4 (four) times daily as needed.   0    No results found for this or any previous visit (from the past 48 hour(s)). No results found.  Review of Systems  All other systems reviewed and are negative.   Blood pressure 156/45, pulse 52, temperature 98 F (36.7 C), temperature source Oral, resp. rate 20, weight 45.178 kg (99 lb 9.6 oz), SpO2 100 %. Physical Exam  Constitutional: She is oriented to person, place, and time. She appears well-developed and well-nourished.  HENT:  Head: Atraumatic.  Eyes: EOM are normal.  Cardiovascular: Intact distal pulses.   Respiratory: Effort normal.  Musculoskeletal:  L shoulder atrophy, pain with limited ROM. NVID.  Neurological: She is alert and oriented to person, place, and time.  Skin: Skin is warm and dry.  Psychiatric: She has a normal mood and affect.     Assessment/Plan L rotator cuff tear arthropathy Plan L reverse TSA Risks / benefits of surgery discussed Consent on chart  NPO for OR Preop antibiotics   Andrea Gilmore 07/27/2014, 7:22 AM

## 2014-07-27 NOTE — Anesthesia Preprocedure Evaluation (Addendum)
Anesthesia Evaluation  Patient identified by MRN, date of birth, ID band Patient awake    Reviewed: Allergy & Precautions, NPO status , Patient's Chart, lab work & pertinent test results  History of Anesthesia Complications Negative for: history of anesthetic complications  Airway Mallampati: II  TM Distance: >3 FB Neck ROM: Full    Dental  (+) Teeth Intact, Dental Advisory Given   Pulmonary neg pulmonary ROS,    Pulmonary exam normal       Cardiovascular negative cardio ROS Normal cardiovascular exam    Neuro/Psych  Headaches, negative neurological ROS  negative psych ROS   GI/Hepatic negative GI ROS, Neg liver ROS,   Endo/Other  negative endocrine ROSHypothyroidism   Renal/GU negative Renal ROS     Musculoskeletal   Abdominal   Peds  Hematology   Anesthesia Other Findings   Reproductive/Obstetrics                            Anesthesia Physical Anesthesia Plan  ASA: III  Anesthesia Plan: General   Post-op Pain Management: GA combined w/ Regional for post-op pain   Induction: Intravenous  Airway Management Planned: Oral ETT  Additional Equipment:   Intra-op Plan:   Post-operative Plan: Extubation in OR  Informed Consent: I have reviewed the patients History and Physical, chart, labs and discussed the procedure including the risks, benefits and alternatives for the proposed anesthesia with the patient or authorized representative who has indicated his/her understanding and acceptance.   Dental advisory given  Plan Discussed with: CRNA, Anesthesiologist and Surgeon  Anesthesia Plan Comments:         Anesthesia Quick Evaluation

## 2014-07-27 NOTE — Transfer of Care (Signed)
Immediate Anesthesia Transfer of Care Note  Patient: Andrea Gilmore  Procedure(s) Performed: Procedure(s) with comments: REVERSE SHOULDER ARTHROPLASTY (Left) - Left reverse total shoulder arthroplasty  Patient Location: PACU  Anesthesia Type:General  Level of Consciousness: awake  Airway & Oxygen Therapy: Patient Spontanous Breathing and Patient connected to nasal cannula oxygen  Post-op Assessment: Report given to RN and Post -op Vital signs reviewed and stable  Post vital signs: Reviewed and stable  Last Vitals:  Filed Vitals:   07/27/14 0556  BP: 156/45  Pulse: 52  Temp: 36.7 C  Resp: 20    Complications: No apparent anesthesia complications

## 2014-07-28 ENCOUNTER — Inpatient Hospital Stay (HOSPITAL_COMMUNITY): Payer: Medicare Other

## 2014-07-28 ENCOUNTER — Encounter (HOSPITAL_COMMUNITY): Payer: Self-pay | Admitting: Orthopedic Surgery

## 2014-07-28 DIAGNOSIS — E872 Acidosis, unspecified: Secondary | ICD-10-CM | POA: Diagnosis present

## 2014-07-28 DIAGNOSIS — N183 Chronic kidney disease, stage 3 unspecified: Secondary | ICD-10-CM | POA: Diagnosis present

## 2014-07-28 DIAGNOSIS — G9341 Metabolic encephalopathy: Secondary | ICD-10-CM | POA: Diagnosis present

## 2014-07-28 DIAGNOSIS — J9601 Acute respiratory failure with hypoxia: Secondary | ICD-10-CM | POA: Diagnosis present

## 2014-07-28 DIAGNOSIS — A419 Sepsis, unspecified organism: Secondary | ICD-10-CM | POA: Diagnosis present

## 2014-07-28 DIAGNOSIS — R509 Fever, unspecified: Secondary | ICD-10-CM | POA: Diagnosis present

## 2014-07-28 DIAGNOSIS — R410 Disorientation, unspecified: Secondary | ICD-10-CM | POA: Diagnosis present

## 2014-07-28 LAB — CBC WITH DIFFERENTIAL/PLATELET
Basophils Absolute: 0 10*3/uL (ref 0.0–0.1)
Basophils Relative: 0 % (ref 0–1)
Eosinophils Absolute: 0 10*3/uL (ref 0.0–0.7)
Eosinophils Relative: 0 % (ref 0–5)
HEMATOCRIT: 22.4 % — AB (ref 36.0–46.0)
Hemoglobin: 7.6 g/dL — ABNORMAL LOW (ref 12.0–15.0)
LYMPHS PCT: 7 % — AB (ref 12–46)
Lymphs Abs: 1.2 10*3/uL (ref 0.7–4.0)
MCH: 30.3 pg (ref 26.0–34.0)
MCHC: 33.9 g/dL (ref 30.0–36.0)
MCV: 89.2 fL (ref 78.0–100.0)
MONO ABS: 1.7 10*3/uL — AB (ref 0.1–1.0)
Monocytes Relative: 10 % (ref 3–12)
NEUTROS ABS: 14.5 10*3/uL — AB (ref 1.7–7.7)
Neutrophils Relative %: 83 % — ABNORMAL HIGH (ref 43–77)
PLATELETS: 229 10*3/uL (ref 150–400)
RBC: 2.51 MIL/uL — ABNORMAL LOW (ref 3.87–5.11)
RDW: 13.9 % (ref 11.5–15.5)
WBC: 17.4 10*3/uL — AB (ref 4.0–10.5)

## 2014-07-28 LAB — URINALYSIS, ROUTINE W REFLEX MICROSCOPIC
Bilirubin Urine: NEGATIVE
Bilirubin Urine: NEGATIVE
GLUCOSE, UA: NEGATIVE mg/dL
Glucose, UA: NEGATIVE mg/dL
Hgb urine dipstick: NEGATIVE
KETONES UR: NEGATIVE mg/dL
Ketones, ur: NEGATIVE mg/dL
Leukocytes, UA: NEGATIVE
NITRITE: NEGATIVE
Nitrite: NEGATIVE
PH: 5.5 (ref 5.0–8.0)
Protein, ur: NEGATIVE mg/dL
Protein, ur: NEGATIVE mg/dL
SPECIFIC GRAVITY, URINE: 1.015 (ref 1.005–1.030)
Specific Gravity, Urine: 1.013 (ref 1.005–1.030)
Urobilinogen, UA: 0.2 mg/dL (ref 0.0–1.0)
Urobilinogen, UA: 0.2 mg/dL (ref 0.0–1.0)
pH: 6 (ref 5.0–8.0)

## 2014-07-28 LAB — POCT I-STAT 3, ART BLOOD GAS (G3+)
Acid-base deficit: 3 mmol/L — ABNORMAL HIGH (ref 0.0–2.0)
Bicarbonate: 22.5 mEq/L (ref 20.0–24.0)
O2 Saturation: 100 %
PH ART: 7.334 — AB (ref 7.350–7.450)
Patient temperature: 98.6
TCO2: 24 mmol/L (ref 0–100)
pCO2 arterial: 42.2 mmHg (ref 35.0–45.0)
pO2, Arterial: 411 mmHg — ABNORMAL HIGH (ref 80.0–100.0)

## 2014-07-28 LAB — BASIC METABOLIC PANEL
ANION GAP: 9 (ref 5–15)
Anion gap: 13 (ref 5–15)
BUN: 22 mg/dL — AB (ref 6–20)
BUN: 24 mg/dL — ABNORMAL HIGH (ref 6–20)
CHLORIDE: 104 mmol/L (ref 101–111)
CO2: 20 mmol/L — AB (ref 22–32)
CO2: 22 mmol/L (ref 22–32)
CREATININE: 1.44 mg/dL — AB (ref 0.44–1.00)
CREATININE: 1.61 mg/dL — AB (ref 0.44–1.00)
Calcium: 8.9 mg/dL (ref 8.9–10.3)
Calcium: 8.9 mg/dL (ref 8.9–10.3)
Chloride: 108 mmol/L (ref 101–111)
GFR calc Af Amer: 37 mL/min — ABNORMAL LOW (ref >60)
GFR calc non Af Amer: 32 mL/min — ABNORMAL LOW (ref >60)
GFR calc non Af Amer: 28 mL/min — ABNORMAL LOW (ref >60)
GFR, EST AFRICAN AMERICAN: 32 mL/min — AB (ref >60)
GLUCOSE: 124 mg/dL — AB (ref 65–99)
Glucose, Bld: 131 mg/dL — ABNORMAL HIGH (ref 65–99)
POTASSIUM: 4.5 mmol/L (ref 3.5–5.1)
Potassium: 5.1 mmol/L (ref 3.5–5.1)
SODIUM: 137 mmol/L (ref 135–145)
SODIUM: 139 mmol/L (ref 135–145)

## 2014-07-28 LAB — COMPREHENSIVE METABOLIC PANEL
ALT: 14 U/L (ref 14–54)
ANION GAP: 9 (ref 5–15)
AST: 70 U/L — ABNORMAL HIGH (ref 15–41)
Albumin: 3 g/dL — ABNORMAL LOW (ref 3.5–5.0)
Alkaline Phosphatase: 28 U/L — ABNORMAL LOW (ref 38–126)
BILIRUBIN TOTAL: 0.9 mg/dL (ref 0.3–1.2)
BUN: 24 mg/dL — ABNORMAL HIGH (ref 6–20)
CO2: 21 mmol/L — ABNORMAL LOW (ref 22–32)
Calcium: 8 mg/dL — ABNORMAL LOW (ref 8.9–10.3)
Chloride: 108 mmol/L (ref 101–111)
Creatinine, Ser: 1.4 mg/dL — ABNORMAL HIGH (ref 0.44–1.00)
GFR calc Af Amer: 38 mL/min — ABNORMAL LOW (ref >60)
GFR calc non Af Amer: 33 mL/min — ABNORMAL LOW (ref >60)
Glucose, Bld: 154 mg/dL — ABNORMAL HIGH (ref 65–99)
Potassium: 4.9 mmol/L (ref 3.5–5.1)
Sodium: 138 mmol/L (ref 135–145)
TOTAL PROTEIN: 5.2 g/dL — AB (ref 6.5–8.1)

## 2014-07-28 LAB — BLOOD GAS, ARTERIAL
Acid-base deficit: 13.5 mmol/L — ABNORMAL HIGH (ref 0.0–2.0)
BICARBONATE: 11.6 meq/L — AB (ref 20.0–24.0)
Drawn by: 252031
FIO2: 0.32 %
O2 Saturation: 92.3 %
PCO2 ART: 23.4 mmHg — AB (ref 35.0–45.0)
Patient temperature: 98.6
TCO2: 12.3 mmol/L (ref 0–100)
pH, Arterial: 7.314 — ABNORMAL LOW (ref 7.350–7.450)
pO2, Arterial: 70.1 mmHg — ABNORMAL LOW (ref 80.0–100.0)

## 2014-07-28 LAB — STREP PNEUMONIAE URINARY ANTIGEN: Strep Pneumo Urinary Antigen: NEGATIVE

## 2014-07-28 LAB — CBC
HCT: 25.2 % — ABNORMAL LOW (ref 36.0–46.0)
HCT: 19.3 % — ABNORMAL LOW (ref 36.0–46.0)
HEMOGLOBIN: 8.4 g/dL — AB (ref 12.0–15.0)
HEMOGLOBIN: 6.6 g/dL — AB (ref 12.0–15.0)
MCH: 29.6 pg (ref 26.0–34.0)
MCH: 30.4 pg (ref 26.0–34.0)
MCHC: 33.3 g/dL (ref 30.0–36.0)
MCHC: 34.2 g/dL (ref 30.0–36.0)
MCV: 88.7 fL (ref 78.0–100.0)
MCV: 88.9 fL (ref 78.0–100.0)
PLATELETS: 160 10*3/uL (ref 150–400)
Platelets: 253 10*3/uL (ref 150–400)
RBC: 2.84 MIL/uL — AB (ref 3.87–5.11)
RBC: 2.17 MIL/uL — ABNORMAL LOW (ref 3.87–5.11)
RDW: 13.8 % (ref 11.5–15.5)
RDW: 14.1 % (ref 11.5–15.5)
WBC: 14.5 10*3/uL — ABNORMAL HIGH (ref 4.0–10.5)
WBC: 12.7 10*3/uL — AB (ref 4.0–10.5)

## 2014-07-28 LAB — LACTIC ACID, PLASMA
LACTIC ACID, VENOUS: 0.8 mmol/L (ref 0.5–2.0)
Lactic Acid, Venous: 3.9 mmol/L (ref 0.5–2.0)
Lactic Acid, Venous: 2.1 mmol/L (ref 0.5–2.0)

## 2014-07-28 LAB — GLUCOSE, CAPILLARY
Glucose-Capillary: 198 mg/dL — ABNORMAL HIGH (ref 65–99)
Glucose-Capillary: 138 mg/dL — ABNORMAL HIGH (ref 65–99)
Glucose-Capillary: 92 mg/dL (ref 65–99)

## 2014-07-28 LAB — TROPONIN I: Troponin I: 0.55 ng/mL (ref <0.031)

## 2014-07-28 LAB — INFLUENZA PANEL BY PCR (TYPE A & B)
H1N1FLUPCR: NOT DETECTED
INFLAPCR: NEGATIVE
Influenza B By PCR: NEGATIVE

## 2014-07-28 LAB — URINE MICROSCOPIC-ADD ON

## 2014-07-28 LAB — PROCALCITONIN: PROCALCITONIN: 0.95 ng/mL

## 2014-07-28 LAB — PROTIME-INR
INR: 1.2 (ref 0.00–1.49)
INR: 1.42 (ref 0.00–1.49)
Prothrombin Time: 15.4 seconds — ABNORMAL HIGH (ref 11.6–15.2)
Prothrombin Time: 17.5 seconds — ABNORMAL HIGH (ref 11.6–15.2)

## 2014-07-28 LAB — APTT
APTT: 28 s (ref 24–37)
aPTT: 30 seconds (ref 24–37)

## 2014-07-28 LAB — CARBOXYHEMOGLOBIN
CARBOXYHEMOGLOBIN: 0.4 % — AB (ref 0.5–1.5)
METHEMOGLOBIN: 1 % (ref 0.0–1.5)
O2 Saturation: 85.4 %
Total hemoglobin: 10.6 g/dL — ABNORMAL LOW (ref 12.0–16.0)

## 2014-07-28 LAB — MRSA PCR SCREENING: MRSA by PCR: NEGATIVE

## 2014-07-28 LAB — FIBRINOGEN: Fibrinogen: 347 mg/dL (ref 204–475)

## 2014-07-28 LAB — CORTISOL: CORTISOL PLASMA: 28.6 ug/dL

## 2014-07-28 LAB — PREPARE RBC (CROSSMATCH)

## 2014-07-28 LAB — ABO/RH: ABO/RH(D): B NEG

## 2014-07-28 MED ORDER — LIDOCAINE HCL (CARDIAC) 20 MG/ML IV SOLN
INTRAVENOUS | Status: AC
  Filled 2014-07-28: qty 5

## 2014-07-28 MED ORDER — SODIUM CHLORIDE 0.9 % IV SOLN
Freq: Once | INTRAVENOUS | Status: AC
  Administered 2014-07-28: 13:00:00 via INTRAVENOUS

## 2014-07-28 MED ORDER — MIDAZOLAM HCL 2 MG/2ML IJ SOLN: 1.0000 mg | INTRAMUSCULAR | Status: DC | PRN

## 2014-07-28 MED ORDER — CETYLPYRIDINIUM CHLORIDE 0.05 % MT LIQD
7.0000 mL | Freq: Four times a day (QID) | OROMUCOSAL | Status: DC
  Administered 2014-07-29 – 2014-07-31 (×10): 7 mL via OROMUCOSAL

## 2014-07-28 MED ORDER — VITAL HIGH PROTEIN PO LIQD: 1000.0000 mL | ORAL | Status: DC

## 2014-07-28 MED ORDER — VANCOMYCIN HCL IN DEXTROSE 750-5 MG/150ML-% IV SOLN
750.0000 mg | Freq: Once | INTRAVENOUS | Status: AC
  Administered 2014-07-28: 750 mg via INTRAVENOUS
  Filled 2014-07-28: qty 150

## 2014-07-28 MED ORDER — LORAZEPAM 2 MG/ML IJ SOLN
0.2500 mg | Freq: Once | INTRAMUSCULAR | Status: AC
  Administered 2014-07-28: 0.25 mg via INTRAVENOUS
  Filled 2014-07-28: qty 1

## 2014-07-28 MED ORDER — PIPERACILLIN-TAZOBACTAM IN DEX 2-0.25 GM/50ML IV SOLN
2.2500 g | Freq: Three times a day (TID) | INTRAVENOUS | Status: DC
Start: 2014-07-28 — End: 2014-07-31
  Administered 2014-07-28 – 2014-07-31 (×9): 2.25 g via INTRAVENOUS
  Filled 2014-07-28 (×12): qty 50

## 2014-07-28 MED ORDER — MIDAZOLAM HCL 2 MG/2ML IJ SOLN
INTRAMUSCULAR | Status: AC
  Administered 2014-07-28: 2 mg
  Filled 2014-07-28: qty 2

## 2014-07-28 MED ORDER — VITAL AF 1.2 CAL PO LIQD
1000.0000 mL | ORAL | Status: DC
  Administered 2014-07-29 – 2014-07-30 (×2): 1000 mL
  Filled 2014-07-28 (×5): qty 1000

## 2014-07-28 MED ORDER — ETOMIDATE 2 MG/ML IV SOLN
INTRAVENOUS | Status: AC
  Administered 2014-07-28: 10 mg
  Filled 2014-07-28: qty 20

## 2014-07-28 MED ORDER — FENTANYL CITRATE (PF) 100 MCG/2ML IJ SOLN
100.0000 ug | INTRAMUSCULAR | Status: DC | PRN
  Administered 2014-07-28 – 2014-07-30 (×5): 100 ug via INTRAVENOUS
  Administered 2014-07-31: 50 ug via INTRAVENOUS
  Filled 2014-07-28 (×4): qty 2

## 2014-07-28 MED ORDER — PIPERACILLIN-TAZOBACTAM 3.375 G IVPB 30 MIN
3.3750 g | Freq: Once | INTRAVENOUS | Status: AC
  Administered 2014-07-28: 3.375 g via INTRAVENOUS
  Filled 2014-07-28: qty 50

## 2014-07-28 MED ORDER — LORAZEPAM 2 MG/ML IJ SOLN
INTRAMUSCULAR | Status: AC
  Administered 2014-07-28: 0.5 mg via INTRAVENOUS
  Filled 2014-07-28: qty 1

## 2014-07-28 MED ORDER — DIAZEPAM 5 MG/ML IJ SOLN: 2.5000 mg | Freq: Three times a day (TID) | INTRAMUSCULAR | Status: DC | PRN

## 2014-07-28 MED ORDER — LORAZEPAM 2 MG/ML IJ SOLN
0.2500 mg | Freq: Once | INTRAMUSCULAR | Status: AC
  Administered 2014-07-28: 0.25 mg via INTRAVENOUS

## 2014-07-28 MED ORDER — FENTANYL CITRATE (PF) 100 MCG/2ML IJ SOLN
100.0000 ug | INTRAMUSCULAR | Status: AC | PRN
  Administered 2014-07-28 – 2014-07-29 (×3): 100 ug via INTRAVENOUS
  Filled 2014-07-28 (×5): qty 2

## 2014-07-28 MED ORDER — LORAZEPAM 2 MG/ML IJ SOLN
INTRAMUSCULAR | Status: AC
  Filled 2014-07-28: qty 1

## 2014-07-28 MED ORDER — SODIUM CHLORIDE 0.9 % IV SOLN: INTRAVENOUS | Status: DC

## 2014-07-28 MED ORDER — LORAZEPAM 2 MG/ML IJ SOLN
0.5000 mg | Freq: Once | INTRAMUSCULAR | Status: AC
  Administered 2014-07-28: 0.5 mg via INTRAVENOUS
  Filled 2014-07-28: qty 1

## 2014-07-28 MED ORDER — SUCCINYLCHOLINE CHLORIDE 20 MG/ML IJ SOLN
INTRAMUSCULAR | Status: AC
  Filled 2014-07-28: qty 1

## 2014-07-28 MED ORDER — ROCURONIUM BROMIDE 50 MG/5ML IV SOLN
INTRAVENOUS | Status: AC
  Administered 2014-07-28: 10 mg
  Filled 2014-07-28: qty 2

## 2014-07-28 MED ORDER — LEVOTHYROXINE SODIUM 100 MCG IV SOLR
25.0000 ug | Freq: Every day | INTRAVENOUS | Status: DC
  Administered 2014-07-28 – 2014-07-31 (×4): 25 ug via INTRAVENOUS
  Filled 2014-07-28 (×4): qty 5

## 2014-07-28 MED ORDER — SODIUM CHLORIDE 0.9 % IV BOLUS (SEPSIS)
500.0000 mL | Freq: Once | INTRAVENOUS | Status: AC
  Administered 2014-07-28: 500 mL via INTRAVENOUS

## 2014-07-28 MED ORDER — FENTANYL CITRATE (PF) 100 MCG/2ML IJ SOLN
12.5000 ug | INTRAMUSCULAR | Status: DC | PRN
  Administered 2014-07-28 – 2014-07-31 (×3): 12.5 ug via INTRAVENOUS
  Filled 2014-07-28 (×3): qty 2

## 2014-07-28 MED ORDER — LORAZEPAM 2 MG/ML IJ SOLN
0.5000 mg | INTRAMUSCULAR | Status: DC | PRN
  Administered 2014-07-28 – 2014-08-02 (×5): 0.5 mg via INTRAVENOUS
  Filled 2014-07-28 (×5): qty 1

## 2014-07-28 MED ORDER — CHLORHEXIDINE GLUCONATE 0.12 % MT SOLN
15.0000 mL | Freq: Two times a day (BID) | OROMUCOSAL | Status: DC
  Administered 2014-07-28 – 2014-07-30 (×5): 15 mL via OROMUCOSAL
  Filled 2014-07-28 (×5): qty 15

## 2014-07-28 MED ORDER — PANTOPRAZOLE SODIUM 40 MG IV SOLR
40.0000 mg | INTRAVENOUS | Status: DC
  Administered 2014-07-28 – 2014-07-30 (×3): 40 mg via INTRAVENOUS
  Filled 2014-07-28 (×4): qty 40

## 2014-07-28 MED ORDER — FENTANYL CITRATE (PF) 100 MCG/2ML IJ SOLN
INTRAMUSCULAR | Status: AC
  Administered 2014-07-28: 100 ug
  Filled 2014-07-28: qty 2

## 2014-07-28 MED ORDER — MIDAZOLAM HCL 2 MG/2ML IJ SOLN
1.0000 mg | INTRAMUSCULAR | Status: DC | PRN
  Administered 2014-07-28: 1 mg via INTRAVENOUS
  Filled 2014-07-28: qty 2

## 2014-07-28 MED ORDER — IOHEXOL 350 MG/ML SOLN
75.0000 mL | Freq: Once | INTRAVENOUS | Status: AC | PRN
  Administered 2014-07-28: 70 mL via INTRAVENOUS

## 2014-07-28 MED ORDER — VANCOMYCIN HCL 500 MG IV SOLR
500.0000 mg | INTRAVENOUS | Status: DC
  Administered 2014-07-29 – 2014-07-31 (×3): 500 mg via INTRAVENOUS
  Filled 2014-07-28 (×3): qty 500

## 2014-07-28 MED ORDER — SODIUM CHLORIDE 0.9 % IV SOLN
INTRAVENOUS | Status: DC
Start: 2014-07-28 — End: 2014-07-31
  Administered 2014-07-28 – 2014-07-29 (×2): via INTRAVENOUS
  Administered 2014-07-29 – 2014-07-30 (×2): 125 mL/h via INTRAVENOUS
  Administered 2014-07-30: 22:00:00 via INTRAVENOUS

## 2014-07-28 MED ORDER — VANCOMYCIN HCL IN DEXTROSE 1-5 GM/200ML-% IV SOLN: 1000.0000 mg | Freq: Once | INTRAVENOUS | Status: DC

## 2014-07-28 NOTE — Progress Notes (Signed)
Pulled two oxycodone 5mg  one at 1038 and the other one at 1042 in error. Given to patient in room 5N05 Andrea Gilmore which the charges are intended for 5N05.

## 2014-07-28 NOTE — Significant Event (Signed)
Rapid Response Event Note                                                Called by Apolonio Schneiders, RN to assess pt who is confused,  Severely agitatied,thrashing about with all fours, tachycardic, tachypneic  Overview: Time Called: 0542 Arrival Time: 0545 Event Type: Neurologic, Respiratory, Cardiac  Initial Focused Assessment:                Upon arrival to room pt is being held down  by 3 nurses, kicking her legs, oriented to person and place.  States year as 65.  Perl 4 mm. Left arm in shoulder sling.  Skin hot to touch, rectal temp 101.9  147/84  176 and regular ,  RR 54   O2 sats 98% on room air Appears air hungry, gasping for air.  Bilateral BS clear.  Incontinent of urine.   Interventions: ABG: 7.314  PCO2 23  PO2 70.  Placed on 4l [place].   Tylenol 650 mg Supp per rectum,                            Lactic acid, stat Blood cultures, urine cultures per Grier Mitts, PA Unable to obtain 12 lead EKG during tachycardia 170's due to equipment malfunction of EKG machine  X 2  Placed on tele monitoring.      CBG 198 Ativan .25 mg IV at 0604 without effect.  Repeated Ativan .5 mg at 0637 per order of Grier Mitts, PA with effect.    0745 HR 117 ST  rr 24 O2 sats 98% on 4l [place].   Event Summary: Name of Physician Notified: Grier Mitts at [phone]    at    Outcome: Stayed in room and stabalized     Thelonious Kauffmann, Gust Brooms

## 2014-07-28 NOTE — Progress Notes (Signed)
Dr. Lavina Hamman aware of patient's current condition. ICU bed requested. Dr. Tamera Punt notified of patient's condition.

## 2014-07-28 NOTE — Progress Notes (Signed)
   PATIENT ID: Andrea Gilmore   1 Day Post-Op Procedure(s) (LRB): REVERSE SHOULDER ARTHROPLASTY (Left)  Subjective: Rapid response called at 6 am for decline in patient cognitive status. Per nursing patient had some confusion overnight but alert, orientated to self and time and comfortable. In the am became agitated and combative, alert with loss of orientation. Tachypneic and hypertensive. Telemetry and ABGs ordered. O2 high 90s/100 in nasal cannula. Ativan given with improvement of agitation, however still confused, not orientated.  Objective:  Filed Vitals:   07/28/14 0715  BP:   Pulse:   Temp:   Resp: 20     Alert, responsive to visual stimuli, could not verbally expresses orientation Moves all extremities spontaneously PEEL L shoulder dressing with small dried blood, distal pulses 2+  Labs:   Recent Labs  07/28/14 0340  HGB 8.4*   Recent Labs  07/28/14 0340  WBC 14.5*  RBC 2.84*  HCT 25.2*  PLT 253   Recent Labs  07/28/14 0340  NA 137  K 4.5  CL 104  CO2 20*  BUN 22*  CREATININE 1.44*  GLUCOSE 131*  CALCIUM 8.9    Assessment and Plan: Decline in mental status overnight with agitation, tachycardia, tachypneia, abnormal ABGs, now calm with ativan CXR, UA, blood cultures ordered, on telemetry, EKG results pending Will consult medicine and we greatly appreciate their help in this case Minimize pain medication L UE will remain in sling, hand wrist elbow ROM only Plan to return home with home health PT/OT, this may change given recent events  VTE proph: ASA 325mg  BID, SCDs

## 2014-07-28 NOTE — Progress Notes (Signed)
ABG obtained on 3lpm

## 2014-07-28 NOTE — Progress Notes (Signed)
OT Cancellation Note  Patient Details Name: Andrea Gilmore MRN: [id number] DOB: [birth year]   Cancelled Treatment:    Reason Eval/Treat Not Completed: Other (comment) Per nursing, hold on OT eval today. Note events of this am and rapid response note. Will check back tomorrow.  [place], [place] 07/28/2014, 10:02 AM

## 2014-07-28 NOTE — Progress Notes (Signed)
CRITICAL VALUE ALERT  Critical value received:  Lactic acid 2.1   Date of notification:  07/28/14  Time of notification:  1223  Critical value read back: yes  Nurse who received alert:  Alyce, RN   MD notified (1st page):  Otis Dials, NP  Time of first page:  1232  MD notified (2nd page):   Time of second page:  Responding MD:    Time MD responded:    No orders received at this time. RN will continue to monitor patient.

## 2014-07-28 NOTE — Procedures (Signed)
Arterial Catheter Insertion Procedure Note Andrea Gilmore [id number] [birth year]  Procedure: Insertion of Arterial Catheter  Indications: Blood pressure monitoring  Procedure Details Consent: Risks of procedure as well as the alternatives and risks of each were explained to the (patient/caregiver).  Consent for procedure obtained. Time Out: Verified patient identification, verified procedure, site/side was marked, verified correct patient position, special equipment/implants available, medications/allergies/relevent history reviewed, required imaging and test results available.  Performed  Maximum sterile technique was used including antiseptics, cap, gloves, gown, hand hygiene, mask and sheet. Skin prep: Chlorhexidine; local anesthetic administered 20 gauge catheter was inserted into left radial artery using the Seldinger technique.  Evaluation Blood flow good; BP tracing good. Complications: No apparent complications.   Andrea Gilmore 07/28/2014

## 2014-07-28 NOTE — Progress Notes (Signed)
Called by nurse manager for stepdown unit regarding changes in patient's status. Family at bedside requesting update regarding swelling in left arm postoperatively. Unfortunately orthopedic team tied up in surgery. Reemphasized that CT of the arm has been ordered and is pending. Nursing staff concerned over increasing swelling in left arm. Repeat labs reviewed and hemoglobin has dropped further to 7.6. This is consistent with symptomatic anemia and waiting on CT to confirm if this is a blood loss anemia postoperatively. We'll go ahead and transfuse 2 units packed blood cells. Have asked nursing staff to please notify Dr. Chandler/orthopedic surgical team of new labs most specifically the drop in hemoglobin and changes with increased swelling in left arm. PCCM consulted due to patient's continued clinical deterioration. Lactic acid has decreased to 2.1 and PCT is 0.95 but WBC has ocreased to 17,500. Of note, UA is normal and not apparently source of fever-urine culture pending (did receive IV Ancef for surgical procedure)  Erin Hearing, ANP   Elliot Meldrum M.D. [facility] 07/28/2014, 2:41 PM  Pager: [phone]

## 2014-07-28 NOTE — Procedures (Signed)
Central Venous Catheter Insertion Procedure Note Andrea Gilmore [id number] 03-10-1925  Procedure: Insertion of Central Venous Catheter Indications: Assessment of intravascular volume, Drug and/or fluid administration and Frequent blood sampling  Procedure Details Consent: Risks of procedure as well as the alternatives and risks of each were explained to the (patient/caregiver).  Consent for procedure obtained. Time Out: Verified patient identification, verified procedure, site/side was marked, verified correct patient position, special equipment/implants available, medications/allergies/relevent history reviewed, required imaging and test results available.  Performed  Maximum sterile technique was used including antiseptics, cap, gloves, gown, hand hygiene, mask and sheet. Skin prep: Chlorhexidine; local anesthetic administered A antimicrobial bonded/coated triple lumen catheter was placed in the right internal jugular vein using the Seldinger technique.  Evaluation Blood flow good Complications: No apparent complications Patient did tolerate procedure well. Chest X-ray ordered to verify placement.  CXR: pending.  U/S used in placement.  Andrea Gilmore 07/28/2014, 2:46 PM

## 2014-07-28 NOTE — Progress Notes (Signed)
Dr. Tana Coast notified and aware of patient's current condition. Patient is thrashing around in bed (seizing) ; very agitated and confused. Patient will not stay in bed. Patient is not following any commands and is unable to communicate. Patient is garbling incomprehensible speech and is very difficult to understand. MD aware that patient is having labored breathing and having accessory muscle use. Patient is unable to tolerate PO's currently. RN will continue to monitor patient closely.

## 2014-07-28 NOTE — Consult Note (Signed)
PULMONARY / CRITICAL CARE MEDICINE   Name: Andrea Gilmore MRN: [id number] DOB: 1925/07/23    ADMISSION DATE:  07/27/2014 CONSULTATION DATE:  07/28/14  REFERRING MD :  Dr. Tamera Punt   CHIEF COMPLAINT:  Acute Blood Loss Anemia post-op reverse shoulder arthroplasty   INITIAL PRESENTATION: 81 y/o F with PMH of hypothyroidism, prior breast cancer and osteroarthritis who was admitted 7/21 for planned left shoulder reverse arthroplasty.  Post-operatively 7/22, the patient developed delirium, hallucinations, fever, and hypoxia.  She was transferred to SDU and worked up for sepsis by [facility].  She was later noted to have left arm swelling and erythema.  PCCM consulted for evaluation 7/22.   STUDIES:  7/22 CXR >> left pleural effusion, hiatal hernia, gaseous distention of the stomach, L shoulder replacement  SIGNIFICANT EVENTS: 7/21  Admit for planned L should reverse arthroplasty  7/22  Early am developed delirium overnight, fever, hallucinations, hypoxia.  Arm swelling, hgb drop from 10.8 to 7.6 concerning for post-op bleeding   HISTORY OF PRESENT ILLNESS:  81 y/o F with PMH of hypothhyroidism, prior breast cancer and osteroarthritis who was admitted 7/21 for planned left shoulder reverse arthroplasty.   Post-operatively 7/22 in the early am, the patient developed delirium, hallucinations, fever, and hypoxia.  She was transferred to SDU and worked up for sepsis by [facility].  She was later noted to have left arm swelling and erythema.  As well as a hgb drop from 10.8 to 7.6.  CT of the arm was ordered per [facility] and is pending to further evaluate L arm swelling.  The patient was typed and screened for 2 units PRBC's.  PCCM consulted for evaluation 7/22 with decline in clinical status.   PAST MEDICAL HISTORY :   has a past medical history of Arthritis; Thyroid disease; and Cancer.  has past surgical history that includes Abdominal hysterectomy; Tonsillectomy; Shoulder surgery (Bilateral); Back surgery;  Bilateral carpal tunnel release; Cholecystectomy; Eye surgery (Right); and Reverse shoulder arthroplasty (Left, 07/27/2014).   Prior to Admission medications   Medication Sig Start Date End Date Taking? Authorizing Provider  aspirin 325 MG buffered tablet Take 325 mg by mouth daily.    Yes Historical Provider, MD  bimatoprost (LUMIGAN) 0.01 % SOLN Place 1 drop into both eyes every other day.    Yes Historical Provider, MD  Calcium Carbonate-Vitamin D (CALCIUM + D PO) Take 1 tablet by mouth daily.   Yes Historical Provider, MD  diazepam (VALIUM) 5 MG tablet Take 5 mg by mouth every 6 (six) hours as needed for anxiety.    Yes Historical Provider, MD  fish oil-omega-3 fatty acids 1000 MG capsule Take 1 g by mouth daily.   Yes Historical Provider, MD  Glucos-Chondroit-Hyaluron-MSM (GLUCOSAMINE CHONDROITIN JOINT) TABS Take 1 tablet by mouth daily.   Yes Historical Provider, MD  HYDROcodone-acetaminophen (NORCO/VICODIN) 5-325 MG per tablet Take 0.5-1 tablets by mouth every 6 (six) hours as needed for moderate pain.   Yes Historical Provider, MD  levothyroxine (SYNTHROID, LEVOTHROID) 50 MCG tablet Take 1 tablet by mouth daily. 03/13/14  Yes Historical Provider, MD  Menthol-Methyl Salicylate (PAIN RELIEVING RUB EX) Apply 1 application topically as needed (pain).   Yes Historical Provider, MD  Multiple Vitamins-Minerals (MULTIVITAMIN WITH MINERALS) tablet Take 1 tablet by mouth daily.     Yes Historical Provider, MD  OVER THE COUNTER MEDICATION Take 1 tablet by mouth daily. Brain & Liver Vitamin   Yes Historical Provider, MD  diclofenac sodium (VOLTAREN) 1 % GEL Apply 1 application  topically 4 (four) times daily as needed.  06/07/14   Historical Provider, MD   Allergies  Allergen Reactions  . Morphine And Related Rash  . Codeine Rash  . Blue Dyes (Parenteral) Itching  . Red Dye Itching    FAMILY HISTORY:  has no family status information on file.    SOCIAL HISTORY:  reports that she has never smoked.  She has never used smokeless tobacco. She reports that she does not drink alcohol or use illicit drugs.  REVIEW OF SYSTEMS:  Unable to complete ROS with patient due to altered mental status.    SUBJECTIVE:   VITAL SIGNS: Temp:  [98.1 F (36.7 C)-101.9 F (38.8 C)] 100.4 F (38 C) (07/22 1200) Pulse Rate:  [76-159] 105 (07/22 1300) Resp:  [18-30] 24 (07/22 1300) BP: (92-157)/(49-117) 92/63 mmHg (07/22 1300) SpO2:  [96 %-100 %] 100 % (07/22 1300) Weight:  [104 lb 4.4 oz (47.3 kg)] 104 lb 4.4 oz (47.3 kg) (07/22 1200)   HEMODYNAMICS:     VENTILATOR SETTINGS:     INTAKE / OUTPUT:  Intake/Output Summary (Last 24 hours) at 07/28/14 1314 Last data filed at 07/27/14 1700  Gross per 24 hour  Intake    240 ml  Output    200 ml  Net     40 ml    PHYSICAL EXAMINATION: General:  Acutely ill female, delirious, not protecting her airway. Neuro:  Arousable but not alert, no gag reflex but moving all ext not to command. HEENT:  [place]/AT, PERRL, EOM-I and MMM. Cardiovascular:  RRR, Nl S1/S2, -M/R/G. Lungs:  Coarse BS diffusely. Abdomen:  Soft, NT, ND and +BS. Musculoskeletal:  -edema and -tenderness, erythema on the left shoulder and arm with significant enlargement. Skin:  As above, otherwise intact.  LABS:  CBC  Recent Labs Lab 07/28/14 0340 07/28/14 1055  WBC 14.5* 17.4*  HGB 8.4* 7.6*  HCT 25.2* 22.4*  PLT 253 229   Coag's  Recent Labs Lab 07/28/14 1055  APTT 28  INR 1.20   BMET  Recent Labs Lab 07/28/14 0340 07/28/14 1055  NA 137 139  K 4.5 5.1  CL 104 108  CO2 20* 22  BUN 22* 24*  CREATININE 1.44* 1.61*  GLUCOSE 131* 124*   Electrolytes  Recent Labs Lab 07/28/14 0340 07/28/14 1055  CALCIUM 8.9 8.9   Sepsis Markers  Recent Labs Lab 07/28/14 0747 07/28/14 1055  LATICACIDVEN 3.9* 2.1*  PROCALCITON  --  0.95   ABG  Recent Labs Lab 07/28/14 0610  PHART 7.314*  PCO2ART 23.4*  PO2ART 70.1*   Liver Enzymes No results for input(s): AST,  ALT, ALKPHOS, BILITOT, ALBUMIN in the last 168 hours. Cardiac Enzymes No results for input(s): TROPONINI, PROBNP in the last 168 hours. Glucose  Recent Labs Lab 07/28/14 0552  GLUCAP 198*    Imaging Dg Chest Port 1 View  07/28/2014   CLINICAL DATA:  Tachycardia.  Postop left shoulder replacement.  EXAM: PORTABLE CHEST - 1 VIEW  COMPARISON:  07/19/2014  FINDINGS: The patient has undergone left shoulder replacement. The heart size is accentuated by technique. Probable left-sided pleural effusion noted. Large hiatal hernia noted. Right lung is clear. No pulmonary edema. Surgical clips overlie the left axillary region. There is gaseous distension of the stomach.  IMPRESSION: 1. Suspect left pleural effusion. 2. Hiatal hernia.  Gaseous distension of the stomach.   Electronically Signed   By: Nolon Nations M.D.   On: 07/28/2014 09:02     ASSESSMENT / PLAN:  PULMONARY OETT 7/22 >>  A: Acute Hypoxic Respiratory Failure / Respiratory Distress- new O2 requirement 7/22 L Pleural Effusion  P:   - Transfer to ICU for planned intubation  - Family OK for short term intubation only  - Follow up CXR - Full vent support. - ABG and CXR post intubation.  CARDIOVASCULAR CVL L IJ TLC 7/22>>> A:  Tachycardia - in setting of respiratory distress  P:  - ICU monitoring. - DNR, no cardioversion. - Vasopressors ok if needed. - Place central line for BP support as above.  RENAL A:   Metabolic Acidosis - lactic acidosis +/- component AKI AKI  P:   - Trend BMP / UOP. - Replace electrolytes as indicated. - IVF resuscitation, may need blood transfusion.  GASTROINTESTINAL A:   Protein calorie malnutrition. P:   - Insert OGT. - TF per nutrition.  HEMATOLOGIC A:   Acute Blood Loss Anemia - suspected L arm hemorrhage post surgery, r/o other etiology Hx Breast Cancer P:  - T&S. - Transfuse one unit.  INFECTIOUS A:   Fever, concern for aspiration PNA vs joint infection. P:   BCx2  7/22>>> UC 7/22>>> Sputum 7/22>>> Abx: Vancomycin 7/22>>> Zosyn 7/22>>> Sepsis protocol.  ENDOCRINE A:   Hyperglycemia   Hypothyroidism  P:   - ISS - CBGs   NEUROLOGIC A:    Acute Encephalopathy - in setting of delirium  P:   RASS goal: 0 PRN versed and fentanyl.  Ortho: significant erythema and enlargement of the left arm, below surgical site.  Good pulses and sensation, good cap refill.  Ortho notified and will come by to see.  X-rays of the shoulder ordered per ortho.  FAMILY  - Updates: Family updated at bedside per Dr. Nelda Marseille.   - Inter-disciplinary family meet or Palliative Care meeting due by: GOC discussed 7/22.   Above note edited in full.  Will intubate and mechanically ventilate, gentle sedation, discussed code status with son and daughter, limited code with no CPR/cardioversion and short term intubation only.  The patient is critically ill with multiple organ systems failure and requires high complexity decision making for assessment and support, frequent evaluation and titration of therapies, application of advanced monitoring technologies and extensive interpretation of multiple databases.   Critical Care Time devoted to patient care services described in this note is  45  Minutes. This time reflects time of care of this signee Dr [name]. This critical care time does not reflect procedure time, or teaching time or supervisory time of PA/NP/Med student/Med Resident etc but could involve care discussion time.  Rush Farmer, M.D. [facility] Pulmonary/Critical Care Medicine. Pager: [phone]. After hours pager: [phone].

## 2014-07-28 NOTE — Progress Notes (Signed)
Attemped NG and OG x5 with another RN. Unsuccessful.

## 2014-07-28 NOTE — Progress Notes (Signed)
Initial Nutrition Assessment   INTERVENTION:    Initiate TF via OGT with Vital High Protein at 25 ml/h, increase by 10 ml every 4 hours to goal rate of 45 ml/h to provide 1296 kcals, 81 gm protein, 876 ml free water daily.  NUTRITION DIAGNOSIS:   Inadequate oral intake related to inability to eat as evidenced by NPO status.  GOAL:   Patient will meet greater than or equal to 90% of their needs  MONITOR:   Vent status, Labs, TF tolerance, Skin, Weight trends  REASON FOR ASSESSMENT:   Consult Enteral/tube feeding initiation and management  ASSESSMENT:   Patient admitted on 7/21 with left shoulder pain and dysfunction from left shoulder endstage bone on bone rotator cuff tear arthropathy. S/P surgery on 7/21.     Required intubation and transfer to the ICU on 7/22.  Patient is currently intubated on ventilator, received MD Consult for TF initiation and management. MV: 6.7 L/min Temp (24hrs), Avg:99.7 F (37.6 C), Min:98.1 F (36.7 C), Max:101.9 F (38.8 C)  Propofol: none   Diet Order:   NPO  Skin:  Reviewed, no issues  Last BM:  7/20  Height:   Ht Readings from Last 1 Encounters:  07/19/14 4\' 11"  (1.499 m)    Weight:   Wt Readings from Last 1 Encounters:  07/28/14 104 lb 4.4 oz (47.3 kg)    Ideal Body Weight:  44.5 kg  Wt Readings from Last 10 Encounters:  07/28/14 104 lb 4.4 oz (47.3 kg)  07/19/14 99 lb 9.6 oz (45.178 kg)  06/20/14 101 lb 1.6 oz (45.859 kg)  04/14/13 108 lb 6.4 oz (49.17 kg)  04/15/12 112 lb 9.6 oz (51.075 kg)  10/07/11 114 lb (51.71 kg)  09/04/11 113 lb 8 oz (51.483 kg)  08/29/11 113 lb 12.8 oz (51.619 kg)  06/25/11 108 lb 14.4 oz (49.397 kg)  11/25/10 120 lb 8 oz (54.658 kg)    BMI:  Body mass index is 21.05 kg/(m^2).  Estimated Nutritional Needs:   Kcal:  5366  Protein:  65-75 gm  Fluid:  1.5 L  EDUCATION NEEDS:   No education needs identified at this time   Molli Barrows, [place], [place], [place] Pager  [phone] After Hours Pager [phone]

## 2014-07-28 NOTE — Progress Notes (Signed)
eLink Physician-Brief Progress Note Patient Name: Andrea Gilmore DOB: Mar 21, 1925 MRN: [id number]   Date of Service  07/28/2014  HPI/Events of Note  Stress ulcer prophylaxis for Endotracheal Intubation.  eICU Interventions  Protonix IV daily.     Intervention Category Minor Interventions: Routine modifications to care plan (e.g. PRN medications for pain, fever)  Tera Partridge 07/28/2014, 3:40 PM

## 2014-07-28 NOTE — Procedures (Signed)
Intubation Procedure Note Andrea Gilmore [id number] 1925/03/27  Procedure: Intubation Indications: Respiratory insufficiency  Procedure Details Consent: Risks of procedure as well as the alternatives and risks of each were explained to the (patient/caregiver).  Consent for procedure obtained. Time Out: Verified patient identification, verified procedure, site/side was marked, verified correct patient position, special equipment/implants available, medications/allergies/relevent history reviewed, required imaging and test results available.  Performed  Maximum sterile technique was used including gloves, hand hygiene and mask.  MAC    Evaluation Hemodynamic Status: BP stable throughout; O2 sats: stable throughout Patient's Current Condition: stable Complications: No apparent complications Patient did tolerate procedure well. Chest X-ray ordered to verify placement.  CXR: pending.   Andrea Gilmore 07/28/2014

## 2014-07-28 NOTE — Progress Notes (Signed)
eLink Physician-Brief Progress Note Patient Name: Andrea Gilmore DOB: [birth year] MRN: [id number]   Date of Service  07/28/2014  HPI/Events of Note  RN notified MD of dry mucus membranes & presence of scopolamine patch.  eICU Interventions  D/C Scopolamine patch.     Intervention Category Minor Interventions: Communication with other healthcare providers and/or family  Tera Partridge 07/28/2014, 7:39 PM

## 2014-07-28 NOTE — Significant Event (Signed)
Rapid Response Event Note  Overview:  Followed up on patient from earlier RRT call Time Called: 0930 Arrival Time: 0930 Event Type: Other (Comment) (follow up from AM RRT call)  Initial Focused Assessment:  Patient restless with increasing delirium - MAE x 4 - will focus briefly - grips but ? To command - daughter present - states her mom is saying some correct things - but mostly mumbling - patients baseline is independent living = still driving and working - PERL 15mm - left shoulder area with DDI - large bruising and swelling noted upper left arm - bruising to elbow - edema noted but soft.  Bil BS present = decreased left lower - no rhonchi or rales noted - RR rapid 28 - shallow - abd soft - non-tender to palp - on 3 liter nasal cannula - O2 sats 97-100% - BP manual 110/64 HR 110 regular - looks ST on monitor - rectal temp now 99.5.     Interventions:  Concerned for post op bleeding secondary to left arm bruising/hematoma- stat CBC and BMP ordered per RRT protocol - labs noted from 0300 - LA resulted at 0845 3.9.  Dr. Tamera Punt paged per RN Hoyle Sauer with updated information - assisted them with contacting flow manager for medical consult.  IV leaking.  Patient repositioned - concerned that she is in pain - had percocet earlier at 14 - only has morphine IV  ordered - daughter states it makes her itch.  Dr. Tana Coast and Erin Hearing NP at bedside.  This RN had to leave for RRT call to CT scan.  On return to room - patient in process of being transferred to SDU.  BP manual check - 102/58 ST 110 -  #20 angio inserted right arm and NS bolus started per order - urine cx has been sent - bld cx early AM done - stat Zoysn abx initiated.  Concerned that patient may need ICU but transfer in progress to SDU - assisted with tx to 2C18.  Assisted  SDU RN Alyce with patient - temp now 100.4 rectal - BP responded to fluid 125/71 HR 102 RR remain 24 - 28 - restless.  Will settle for short while.  Handoff to Alyce RN - will  text page NP with update - watch closely.  Low threshold for ICU transfer.     Event Summary: Name of Physician Notified: Dr. Arlyss Gandy PA at  (pta RRT)  Name of Consulting Physician Notified: Dr. Biagio Quint NP at 1000 (by Dr. Tamera Punt)  Outcome: Transferred (Comment) [phone])  Event End Time: [place]  Quin Hoop

## 2014-07-28 NOTE — Consult Note (Signed)
[facility] Consultation Note                                                                                    Andrea Gilmore, is a 81 y.o. female  MRN: [id number]   DOB - 10/08/1925  Admit Date - 07/27/2014  Outpatient Primary MD for the patient is Mayra Neer, MD  Requesting MD: Tamera Punt / Orthopedic  With History of -  Past Medical History  Diagnosis Date  . Arthritis   . Thyroid disease   . Cancer     breast cancer      Past Surgical History  Procedure Laterality Date  . Abdominal hysterectomy    . Tonsillectomy    . Shoulder surgery Bilateral   . Back surgery    . Bilateral carpal tunnel release    . Cholecystectomy    . Eye surgery Right   . Reverse shoulder arthroplasty Left 07/27/2014    Procedure: REVERSE SHOULDER ARTHROPLASTY;  Surgeon: Tania Ade, MD;  Location: [place];  Service: Orthopedics;  Laterality: Left;  Left reverse total shoulder arthroplasty     HPI This is an 81yo female patient admitted on 7/21 by the orthopedic team to undergo left reverse shoulder arthroplasty for end-stage arthritis with irrepairable rotator cuff injury. Past medical history significant for osteoarthritis, hypothyroidism and prior breast cancer. At baseline patient is ambulatory, continues to drive and continues to work outside the home. According to nursing staff patient did well in the immediate postop period but overnight developed what sounds like significant rigors, altered mentation as evidenced by acute delirium and hallucinations, fever of 101.9 degrees Fahrenheit and unexplained hypoxemia. Chest x-ray reveals a possible evolving left pleural effusion. Primary team has obtained blood cultures and patient is currently requiring 3 L nasal cannula oxygen. ABG revealed mild metabolic acidemia and hypoxemia with a pH is 7.31, PCO2 23, PO2 70, bicarbonate 11.6 and acid base deficit 13.5. Lactic acid was 3.9. We have been asked to evaluate the patient for possible  sepsis. In addition patient was found to have significant bruising with soft marked edema of the left upper extremity above the elbow but below the operative site, on same arm as operative site.   Review of Systems   In addition to the HPI above,  *Patient unable to contribute due to acute delirium No Headache, changes with Vision or hearing, new weakness, tingling, numbness in any extremity, No apparent problems swallowing food or Liquids, indigestion/reflux portable nursing staff No Chest pain, Cough or Shortness of Breath, palpitations, orthopnea or DOE reported to staff prior to onset of current symptoms No Abdominal pain, N/V; no melena or hematochezia, no dark tarry stools, Bowel movements are regular reported prior to admission No dysuria, hematuria or flank pain reported by patient prior to onset of current symptoms No new skin rashes, lesions, masses or bruises, No new joints pains-aches No recent weight gain or loss No polyuria, polydypsia or polyphagia,  *A full 10 point Review of Systems was done, except as stated above, all other Review of Systems were negative.  Social History History  Substance Use Topics  . Smoking status: Never Smoker   . Smokeless tobacco:  Never Used  . Alcohol Use: No    Resides at: Private residence  Lives with: Alone  Ambulatory status: Without assistive devices, continues to drive and work outside the home   Family History No family history on file. chart review and discuss briefly with patient's daughter; noncontributory to current admission   Prior to Admission medications   Medication Sig Start Date End Date Taking? Authorizing Provider  aspirin 325 MG buffered tablet Take 325 mg by mouth daily.    Yes Historical Provider, MD  bimatoprost (LUMIGAN) 0.01 % SOLN Place 1 drop into both eyes every other day.    Yes Historical Provider, MD  Calcium Carbonate-Vitamin D (CALCIUM + D PO) Take 1 tablet by mouth daily.   Yes Historical  Provider, MD  diazepam (VALIUM) 5 MG tablet Take 5 mg by mouth every 6 (six) hours as needed for anxiety.    Yes Historical Provider, MD  fish oil-omega-3 fatty acids 1000 MG capsule Take 1 g by mouth daily.   Yes Historical Provider, MD  Glucos-Chondroit-Hyaluron-MSM (GLUCOSAMINE CHONDROITIN JOINT) TABS Take 1 tablet by mouth daily.   Yes Historical Provider, MD  HYDROcodone-acetaminophen (NORCO/VICODIN) 5-325 MG per tablet Take 0.5-1 tablets by mouth every 6 (six) hours as needed for moderate pain.   Yes Historical Provider, MD  levothyroxine (SYNTHROID, LEVOTHROID) 50 MCG tablet Take 1 tablet by mouth daily. 03/13/14  Yes Historical Provider, MD  Menthol-Methyl Salicylate (PAIN RELIEVING RUB EX) Apply 1 application topically as needed (pain).   Yes Historical Provider, MD  Multiple Vitamins-Minerals (MULTIVITAMIN WITH MINERALS) tablet Take 1 tablet by mouth daily.     Yes Historical Provider, MD  OVER THE COUNTER MEDICATION Take 1 tablet by mouth daily. Brain & Liver Vitamin   Yes Historical Provider, MD  diclofenac sodium (VOLTAREN) 1 % GEL Apply 1 application topically 4 (four) times daily as needed.  06/07/14   Historical Provider, MD    Allergies  Allergen Reactions  . Morphine And Related Rash  . Codeine Rash  . Blue Dyes (Parenteral) Itching  . Red Dye Itching    Physical Exam  Vitals  Blood pressure 121/98, pulse 98, temperature 99.9 F (37.7 C), temperature source Rectal, resp. rate 22, weight 99 lb 9.6 oz (45.178 kg), SpO2 100 %.   General:  Anxious and easily agitated, appears somewhat toxic  Psych: Very agitated, appears to be hallucinating, oriented times name only, limited eye contact, has been requiring wrist restraints  Neuro:   No focal neurological deficits, CN II through XII appear grossly intact, Strength 5/5 all 4 extremities, Sensation intact all 4 extremities.  ENT:  Ears and Eyes appear Normal, Conjunctivae clear, PER. Moist oral mucosa without erythema or  exudates.  Neck:  Supple, No lymphadenopathy appreciated  Respiratory:  Symmetrical chest wall movement, Good air movement bilaterally, CTAB. 3 L, respiratory rate elevated  Cardiac:  RRR tachycardia, No Murmurs, no LE edema noted, no JVD, No carotid bruits, peripheral pulses palpable at 2+  Abdomen:  Positive bowel sounds, Soft, Non tender, Non distended,  No masses appreciated, no obvious hepatosplenomegaly  Skin:  No Cyanosis, Normal Skin Turgor, No Skin Rash, significant soft bruising left upper extremity above the elbow but below shoulder  Extremities: Asymmetrical as above, no effusions.  Data Review  CBC  Recent Labs Lab 07/28/14 0340  WBC 14.5*  HGB 8.4*  HCT 25.2*  PLT 253  MCV 88.7  MCH 29.6  MCHC 33.3  RDW 13.8    Chemistries   Recent  Labs Lab 07/28/14 0340  NA 137  K 4.5  CL 104  CO2 20*  GLUCOSE 131*  BUN 22*  CREATININE 1.44*  CALCIUM 8.9    estimated creatinine clearance is 18.8 mL/min (by C-G formula based on Cr of 1.44).  No results for input(s): TSH, T4TOTAL, T3FREE, THYROIDAB in the last 72 hours.  Invalid input(s): FREET3  Coagulation profile No results for input(s): INR, PROTIME in the last 168 hours.  No results for input(s): DDIMER in the last 72 hours.  Cardiac Enzymes No results for input(s): CKMB, TROPONINI, MYOGLOBIN in the last 168 hours.  Invalid input(s): CK  Invalid input(s): POCBNP  Urinalysis    Component Value Date/Time   COLORURINE YELLOW 07/19/2014 [place] 07/19/2014 1407   LABSPEC 1.010 07/19/2014 1407   PHURINE 5.5 07/19/2014 1407   GLUCOSEU NEGATIVE 07/19/2014 1407   HGBUR NEGATIVE 07/19/2014 1407   BILIRUBINUR NEGATIVE 07/19/2014 1407   KETONESUR NEGATIVE 07/19/2014 1407   PROTEINUR NEGATIVE 07/19/2014 1407   UROBILINOGEN 0.2 07/19/2014 1407   NITRITE NEGATIVE 07/19/2014 1407   LEUKOCYTESUR TRACE* 07/19/2014 1407    Imaging results:   Dg Chest 2 View  07/19/2014   CLINICAL  DATA:  Preoperative exam prior left shoulder surgery, history of breast malignancy  EXAM: CHEST  2 VIEW  COMPARISON:  PA and lateral chest x-ray of October 07, 2012  FINDINGS: The lungs are hyperinflated with hemidiaphragm flattening. There is no focal infiltrate. There is no pleural effusion. The heart and pulmonary vascularity are normal. There is calcification in the wall of the aortic arch. There is mild tortuosity of the descending thoracic aorta. There is a moderate-sized hiatal hernia. There is degenerative disc disease at multiple thoracic levels. There are degenerative changes of both shoulders.  IMPRESSION: COPD.  There is no active cardiopulmonary disease.   Electronically Signed   By: David  Martinique M.D.   On: 07/19/2014 15:31   Ct Shoulder Left Wo Contrast  07/14/2014   CLINICAL DATA:  Chronic left shoulder pain for years. History of surgery in the 1980s and left breast cancer. pre-op for left shoulder replacement. Initial encounter.  EXAM: CT OF THE LEFT SHOULDER WITHOUT CONTRAST  TECHNIQUE: Multidetector CT imaging was performed according to the standard protocol. Multiplanar CT image reconstructions were also generated.  COMPARISON:  None.  FINDINGS: There are severe glenohumeral degenerative changes on the left with marked joint space loss, osteophyte and subchondral cyst formation in the humeral head and glenoid. There is a large amount of complex fluid within the shoulder joint, superior subscapularis recess and coracoid bursa. There are multiple intra-articular loose bodies, the largest along the superior glenoid rim, measuring up to 18 mm in diameter.  The subacromial space is obliterated. There is erosion of the undersurface of the acromion. The acromioclavicular joint is widened consistent with previous distal clavicle resection. There is a large chronic rotator cuff tear with marked atrophy of the supraspinatus and infraspinous muscles.  Emphysematous changes are present within the left lung.  No chest wall lesion identified. There is atherosclerosis of the aorta and coronary arteries.  IMPRESSION: 1. Advanced glenohumeral degenerative changes as described with multiple intra-articular loose bodies. 2. Obliterated subacromial space with large chronic rotator cuff tear involving the supraspinatus and infraspinous tendons.   Electronically Signed   By: Richardean Sale M.D.   On: 07/14/2014 15:44   Dg Chest Port 1 View  07/28/2014   CLINICAL DATA:  Tachycardia.  Postop left shoulder replacement.  EXAM:  PORTABLE CHEST - 1 VIEW  COMPARISON:  07/19/2014  FINDINGS: The patient has undergone left shoulder replacement. The heart size is accentuated by technique. Probable left-sided pleural effusion noted. Large hiatal hernia noted. Right lung is clear. No pulmonary edema. Surgical clips overlie the left axillary region. There is gaseous distension of the stomach.  IMPRESSION: 1. Suspect left pleural effusion. 2. Hiatal hernia.  Gaseous distension of the stomach.   Electronically Signed   By: Nolon Nations M.D.   On: 07/28/2014 09:02   Dg Shoulder Left Port  07/27/2014   CLINICAL DATA:  Postop  EXAM: LEFT SHOULDER - 1 VIEW  COMPARISON:  07/14/2014  FINDINGS: Left total shoulder arthroplasty is anatomically aligned. There is no breakage or loosening of the hardware. Gas in the soft tissues is consistent with recent surgery. Loose body above the acetabular component is stable.  IMPRESSION: Left total shoulder arthroplasty anatomically aligned.   Electronically Signed   By: Marybelle Killings M.D.   On: 07/27/2014 10:04     EKG: (Independently reviewed) pending   Assessment & Plan  Principal Problem:   Sepsis/fever/metabolic acidosis -Transfer to stepdown -Sepsis criteria met by the following physiology: Fever, altered mentation, tachycardia, tachypnea, metabolic acidemia-source unknown but given hypoxia potentially pulmonary etiology -Patient with postoperative leukocytosis 14,500 -Cycle lactic  acid -Chek Procalcitonin -Empiric broad-spectrum antibiotics with vancomycin and Zosyn -Follow up on previously obtain blood cultures -Check urinalysis and culture -Remains hemodynamically stable but continue to monitor; did have significant tachycardia with rates into the 150s while febrile -Weight-based volume resuscitation  Active Problems:   Metabolic encephalopathy/Acute delirium -Suspect related to acute infectious process; likely exacerbated by patient's advanced age and recent anesthesia -Treat underlying infectious processes -Focus on patient's safety and restrain if necessary -Home meds include Valium which patient has received to appropriate doses of since admission so doubt benzodiazepine withdrawal as etiology -CT of the head out contrast    Acute respiratory failure with hypoxia -Since immediate postop differential includes PE versus aspiration pneumonitis -Question of left pleural effusion versus abnormal x-ray secondary to poor inspiratory effort -Continue supportive care with oxygen -ck Flu PCR -Check stat CT of the chest to rule out PE -Check echocardiogram -EKG pending-if concerning for ischemia cycle troponins   CKD, stage III -Patient appears to have chronic elevation in BUN and creatinine can range anywhere from 1.1 to1.4 therefore currently appears at baseline    Known chronic left rotator cuff status post reversed shoulder arthroplasty -Management per orthopedic team -Marked edema and bruising left upper extremity below operative site so check CT left upper extremity to rule out hematoma, seroma or abscess    History of iron deficiency anemia  -Baseline hemoglobin 10.8 and currently hemoglobin 8.4 -Consider transfusion if hemoglobin drops below 8 or if symptomatic     DVT Prophylaxis: SCDs  Family Communication:   Daughter at bedside  Code Status:  Full code  Condition:  Guarded  Discharge disposition: At discretion of primary attending  physician  Time spent in minutes : 60      Eudell Mcphee L. ANP on 07/28/2014 at 11:21 AM  Between 7am to 7pm - Pager - [phone]  After 7pm go to [email or web address] - password [email or web address]  And look for the night coverage person covering me after hours  [facility]

## 2014-07-28 NOTE — Progress Notes (Signed)
ANTIBIOTIC CONSULT NOTE - INITIAL  Pharmacy Consult for Vancomycin and Zosyn Indication: rule out sepsis  Allergies  Allergen Reactions  . Morphine And Related Rash  . Codeine Rash  . Blue Dyes (Parenteral) Itching  . Red Dye Itching    Patient Measurements: Weight: 99 lb 9.6 oz (45.178 kg) Adjusted Body Weight:   Vital Signs: Temp: 99.9 F (37.7 C) (07/22 0926) Temp Source: Rectal (07/22 0926) BP: 121/98 mmHg (07/22 0926) Pulse Rate: 98 (07/22 0926) Intake/Output from previous day: 07/21 0701 - 07/22 0700 In: 1890 [P.O.:240; I.V.:1400; IV Piggyback:250] Out: 575 [Urine:350; Blood:225] Intake/Output from this shift:    Labs:  Recent Labs  07/28/14 0340  WBC 14.5*  HGB 8.4*  PLT 253  CREATININE 1.44*   Estimated Creatinine Clearance: 18.8 mL/min (by C-G formula based on Cr of 1.44). No results for input(s): VANCOTROUGH, VANCOPEAK, VANCORANDOM, GENTTROUGH, GENTPEAK, GENTRANDOM, TOBRATROUGH, TOBRAPEAK, TOBRARND, AMIKACINPEAK, AMIKACINTROU, AMIKACIN in the last 72 hours.   Microbiology: Recent Results (from the past 720 hour(s))  Surgical pcr screen     Status: None   Collection Time: 07/19/14  2:07 PM  Result Value Ref Range Status   MRSA, PCR NEGATIVE NEGATIVE Final   Staphylococcus aureus NEGATIVE NEGATIVE Final    Comment:        The Xpert SA Assay (FDA approved for NASAL specimens in patients over 55 years of age), is one component of a comprehensive surveillance program.  Test performance has been validated by [facility] for patients greater than or equal to 15 year old. It is not intended to diagnose infection nor to guide or monitor treatment.   Culture, blood (routine x 2)     Status: None (Preliminary result)   Collection Time: 07/28/14  7:25 AM  Result Value Ref Range Status   Specimen Description BLOOD LEFT ARM  Final   Special Requests BOTTLES DRAWN AEROBIC AND ANAEROBIC 5CC  Final   Culture PENDING  Incomplete   Report Status PENDING   Incomplete  Culture, blood (routine x 2)     Status: None (Preliminary result)   Collection Time: 07/28/14  7:35 AM  Result Value Ref Range Status   Specimen Description BLOOD RIGHT ANTECUBITAL  Final   Special Requests BOTTLES DRAWN AEROBIC AND ANAEROBIC 5CC  Final   Culture PENDING  Incomplete   Report Status PENDING  Incomplete    Medical History: Past Medical History  Diagnosis Date  . Arthritis   . Thyroid disease   . Cancer     breast cancer    Medications:  Scheduled:  . acetaminophen  1,000 mg Oral 4 times per day  . aspirin EC  325 mg Oral BID  . docusate sodium  100 mg Oral BID  . levothyroxine  50 mcg Oral QAC breakfast  . piperacillin-tazobactam  3.375 g Intravenous Once  . vancomycin  1,000 mg Intravenous Once   Assessment: 81 yo F POD#1 L shoulder surgery who experienced increased confusion overnight, increased agitation, tachycardic, tachypneic and hypertensive, elevated lactic acid, WBC 14.5, Tm 101.9.  To start Vancomycin and Zosyn for presumed sepsis.  Pt is small (45 kg) with elevated SCr (1.4) and calculated CrCl ~ 20 ml/min.  Goal of Therapy:  Vancomycin trough level 15-20 mcg/ml Renal dose adjustment of antibiotics Eradication of infection  Plan:  Zosyn 3.375 gm IV x 1 now (ordered by MD) Zosyn 2.25 gm IV q8h Vancomycin 750 mg IV x1 now Vancomycin 500 mg IV q24h Follow-up cx data and clinical progress.  Vancomycin trough as needed.   [employer], Pharm.D., BCPS Clinical Pharmacist Pager [phone] 07/28/2014 10:26 AM

## 2014-07-28 NOTE — Progress Notes (Signed)
Patient now intubated in ICU.  Appreciate critical care assistance.  Work up still in progress.  L shoulder examined and consistent with pos op swelling/hematoma likely due to significant movement immediately post op overnight with agitation/convulsions. This has probably led to increased internal bleeding into deadspace from surgery, but I have NO concern of infection in the shoulder at this time.  Will ice shoulder for now.  XR shoulder shows no problems today.  CT will show hematoma/blood collection, but I would not recommend any evacuation.  Tranfuse as needed per CC.  I do not expect ongoing blood loss.  Again, greatly appreciate critical care assistance.

## 2014-07-28 NOTE — Progress Notes (Signed)
CRITICAL VALUE ALERT  Critical value received: Lactic Acid 3.9  Date of notification: 07-28-14  Time of notification:  8:50  Critical value read back:  Yes  Nurse who received alert:  Alfonso Ellis, RN  MD notified (1st page):  Dr. Tamera Punt  Time of first page:  8:56  MD notified (2nd page):  NA  Time of second page:   NA   Responding MD:  Andee Poles, [place] for Dr. Tamera Punt  Time MD responded:  9:00

## 2014-07-28 NOTE — Progress Notes (Signed)
Patient with increased confusion throughout the night. Patient continuously trying to get out of the bed, removing the sling from her arm, not following weight bearing precautions and not following instructions to press the call bell for assistance.  Patient given Valium 5mg  and Oxycodone 5mg , as she takes valium at home also. Medications seemed to have no effect.  At 0530 I noticed patient was violently jerking her arms and legs and was unable to be re-oriented.  Patient then was very tachycardic and tachypneic.  Vital signs then taken which was showing patient was tachycardic at 153, 98% on room air, 147/84 and febrile at 101.9 rectally.  Patient was still continuing to jerk her arms and legs violently, so this RN called rapid response.  [facility] RN came and assessed patient and an EKG was obtained along with new orders from [facility] for an ABG, lactic acid, blood cultures x 2, CBC and BMP.  I then administered Tylenol 650mg  suppository. On call PA notified, Grier Mitts, and received new orders for IV Ativan; a total of 0.75 mg of Ativan was administered to patient. Patient then resting comfortably on the bed in no distress. Dr. Tamera Punt and PA came to assess patient; no new orders received at this time.

## 2014-07-28 NOTE — Progress Notes (Signed)
RT pulled ETT back to 19cm at the lip per the note from the doctor after CT scan. RT will continue to monitor.

## 2014-07-28 NOTE — Plan of Care (Signed)
Problem: Consults Goal: Diagnosis - Shoulder Surgery Reverse Total Shoulder Arthroplasty     

## 2014-07-28 NOTE — Progress Notes (Signed)
PT Cancellation Note  Patient Details Name: Andrea Gilmore MRN: [id number] DOB: 1926/01/01   Cancelled Treatment:    Reason Eval/Treat Not Completed: Medical issues which prohibited therapy (order received, events of today noted and pt transferred [facility] to [place] and will hold at this time )   Andrea Gilmore 07/28/2014, 2:07 PM Elwyn Reach, [place]

## 2014-07-28 NOTE — Progress Notes (Signed)
Removed Scopolamine patch per MD order. Will continue to monitor.

## 2014-07-29 ENCOUNTER — Inpatient Hospital Stay (HOSPITAL_COMMUNITY): Payer: Medicare Other

## 2014-07-29 ENCOUNTER — Other Ambulatory Visit (HOSPITAL_COMMUNITY): Payer: Medicare Other

## 2014-07-29 DIAGNOSIS — R0600 Dyspnea, unspecified: Secondary | ICD-10-CM

## 2014-07-29 LAB — GLUCOSE, CAPILLARY
GLUCOSE-CAPILLARY: 105 mg/dL — AB (ref 65–99)
GLUCOSE-CAPILLARY: 109 mg/dL — AB (ref 65–99)
GLUCOSE-CAPILLARY: 128 mg/dL — AB (ref 65–99)
GLUCOSE-CAPILLARY: 97 mg/dL (ref 65–99)
Glucose-Capillary: 85 mg/dL (ref 65–99)
Glucose-Capillary: 108 mg/dL — ABNORMAL HIGH (ref 65–99)

## 2014-07-29 LAB — BASIC METABOLIC PANEL
Anion gap: 9 (ref 5–15)
BUN: 19 mg/dL (ref 6–20)
CO2: 18 mmol/L — ABNORMAL LOW (ref 22–32)
Calcium: 7.2 mg/dL — ABNORMAL LOW (ref 8.9–10.3)
Chloride: 109 mmol/L (ref 101–111)
Creatinine, Ser: 1.25 mg/dL — ABNORMAL HIGH (ref 0.44–1.00)
GFR calc Af Amer: 44 mL/min — ABNORMAL LOW (ref >60)
GFR, EST NON AFRICAN AMERICAN: 38 mL/min — AB (ref >60)
GLUCOSE: 83 mg/dL (ref 65–99)
Potassium: 4.1 mmol/L (ref 3.5–5.1)
SODIUM: 136 mmol/L (ref 135–145)

## 2014-07-29 LAB — CBC
HCT: 25.5 % — ABNORMAL LOW (ref 36.0–46.0)
HEMATOCRIT: 24.6 % — AB (ref 36.0–46.0)
Hemoglobin: 8.8 g/dL — ABNORMAL LOW (ref 12.0–15.0)
Hemoglobin: 8.6 g/dL — ABNORMAL LOW (ref 12.0–15.0)
MCH: 29.6 pg (ref 26.0–34.0)
MCH: 30.5 pg (ref 26.0–34.0)
MCHC: 34.5 g/dL (ref 30.0–36.0)
MCHC: 35 g/dL (ref 30.0–36.0)
MCV: 85.9 fL (ref 78.0–100.0)
MCV: 87.2 fL (ref 78.0–100.0)
PLATELETS: 139 10*3/uL — AB (ref 150–400)
PLATELETS: 130 10*3/uL — AB (ref 150–400)
RBC: 2.97 MIL/uL — ABNORMAL LOW (ref 3.87–5.11)
RBC: 2.82 MIL/uL — AB (ref 3.87–5.11)
RDW: 15.2 % (ref 11.5–15.5)
RDW: 15.8 % — ABNORMAL HIGH (ref 11.5–15.5)
WBC: 10.8 10*3/uL — ABNORMAL HIGH (ref 4.0–10.5)
WBC: 11.2 10*3/uL — ABNORMAL HIGH (ref 4.0–10.5)

## 2014-07-29 LAB — TYPE AND SCREEN
ABO/RH(D): B NEG
Antibody Screen: NEGATIVE
Unit division: 0
Unit division: 0

## 2014-07-29 LAB — HEMOGLOBIN AND HEMATOCRIT, BLOOD
HCT: 24.7 % — ABNORMAL LOW (ref 36.0–46.0)
Hemoglobin: 8.6 g/dL — ABNORMAL LOW (ref 12.0–15.0)

## 2014-07-29 LAB — PHOSPHORUS: Phosphorus: 3.6 mg/dL (ref 2.5–4.6)

## 2014-07-29 LAB — URINE CULTURE: CULTURE: NO GROWTH

## 2014-07-29 LAB — MAGNESIUM: Magnesium: 1.6 mg/dL — ABNORMAL LOW (ref 1.7–2.4)

## 2014-07-29 LAB — LACTIC ACID, PLASMA: Lactic Acid, Venous: 0.6 mmol/L (ref 0.5–2.0)

## 2014-07-29 MED ORDER — MAGNESIUM SULFATE 2 GM/50ML IV SOLN
2.0000 g | Freq: Once | INTRAVENOUS | Status: AC
  Administered 2014-07-29: 2 g via INTRAVENOUS
  Filled 2014-07-29: qty 50

## 2014-07-29 NOTE — Progress Notes (Signed)
[facility] Progress Note Patient Name: Andrea Gilmore DOB: [birth year] MRN: [id number]   Date of Service  07/29/2014  HPI/Events of Note  Hypomag  eICU Interventions  Mag replaced     Intervention Category Intermediate Interventions: Electrolyte abnormality - evaluation and management  DETERDING,ELIZABETH 07/29/2014, 5:19 AM

## 2014-07-29 NOTE — Evaluation (Signed)
Occupational Therapy Evaluation Patient Details Name: Andrea Gilmore MRN: [id number] DOB: [birth year] Today's Date: 07/29/2014    History of Present Illness Pt admitted for L reverse TSA on 7/21. Transferred to ICU 7/22 due to delirium, hallucinations, fever, hypoxia and anemia and intubated.   Clinical Impression   Pt was ambulating with a cane and living independently prior to admission.  She does not drive.  Pt currently mildly sedated and intubated.  Son at bedside and would like to take pt home, has lined up 24 hour care.  Performed PROM L elbow to hand today and positioned L UE in sling. Plan is for extubation tomorrow. Will follow acutely and determine discharge recommendations after mobility is evaluated.    Follow Up Recommendations  Supervision/Assistance - 24 hour (to be determined after extubation)    Equipment Recommendations   (TBA)    Recommendations for Other Services       Precautions / Restrictions Precautions Precautions: Fall;Shoulder Type of Shoulder Precautions: conservative Shoulder Interventions: Shoulder sling/immobilizer;Off for dressing/bathing/exercises Precaution Booklet Issued: No Precaution Comments: instructed son in precautions Required Braces or Orthoses: Sling Restrictions Weight Bearing Restrictions: Yes LUE Weight Bearing: Non weight bearing      Mobility Bed Mobility Overal bed mobility: +2 for physical assistance             General bed mobility comments: +2 to pull up in bed  Transfers                      Balance                                            ADL                                         General ADL Comments: Pt currently dependent in all ADL, NPO on ventilator.     Vision     Perception     Praxis      Pertinent Vitals/Pain Pain Assessment: Faces Faces Pain Scale: No hurt     Hand Dominance Right   Extremity/Trunk Assessment Upper Extremity  Assessment Upper Extremity Assessment: RUE deficits/detail;LUE deficits/detail RUE Deficits / Details: per son, pt shoulder is "bone on bone" and painful, was planning to have a TSA, arthritic changes in hand  LUE Deficits / Details: Very bruised, edematous, full AROM elbow to hand, performed x 10, arthritic changes in hand LUE: Unable to fully assess due to immobilization LUE Coordination: decreased gross motor   Lower Extremity Assessment Lower Extremity Assessment: Generalized weakness (pt uses B AFOs due to foot drop per son)       Communication     Cognition Arousal/Alertness: Lethargic;Suspect due to medications (pt partially sedated) Behavior During Therapy: WFL for tasks assessed/performed Overall Cognitive Status: Difficult to assess                     General Comments       Exercises       Shoulder Instructions      Home Living Family/patient expects to be discharged to:: Private residence Living Arrangements: Alone Available Help at Discharge: Family;Available 24 hours/day;Personal care attendant (family had planned to hire assist 3 hours a day post sx) Type of Home: House  Home Access: Stairs to enter [employer] of Steps: 2 Entrance Stairs-Rails: Can reach both Home Layout: One level     Bathroom Shower/Tub: Teacher, early years/pre: Standard     Home Equipment: Environmental consultant - 2 wheels;Walker - 4 wheels;Cane - single point;Wheelchair - manual          Prior Functioning/Environment Level of Independence: Independent with assistive device(s)        Comments: pt ambulated outdoors with cane    OT Diagnosis: Generalized weakness   OT Problem List: Decreased strength;Decreased range of motion;Decreased activity tolerance;Impaired balance (sitting and/or standing);Decreased coordination;Cardiopulmonary status limiting activity;Impaired UE functional use;Increased edema   OT Treatment/Interventions: Self-care/ADL  training;Therapeutic exercise;DME and/or AE instruction;Therapeutic activities;Patient/family education    OT Goals(Current goals can be found in the care plan section) Acute Rehab OT Goals Patient Stated Goal: unable to state, son would like to take pt home OT Goal Formulation: With family Time For Goal Achievement: 08/12/14 Potential to Achieve Goals: Good ADL Goals Pt Will Perform Grooming: with min assist;standing Pt Will Perform Upper Body Bathing: with min assist;sitting Pt Will Perform Lower Body Bathing: with min assist;sit to/from stand Pt Will Perform Upper Body Dressing: with min assist;sitting Pt Will Perform Lower Body Dressing: with min assist;sit to/from stand Pt Will Transfer to Toilet: with min assist;ambulating;regular height toilet Pt Will Perform Toileting - Clothing Manipulation and hygiene: with min assist;sit to/from stand Pt/caregiver will Perform Home Exercise Program: Left upper extremity;Independently (AROM L elbow to hand) Additional ADL Goal #1: Pt and caregiver will be independent in donning and doffing sling.  OT Frequency: Min 3X/week   Barriers to D/C:            Co-evaluation              End of Session Nurse Communication:  (ok to leave mitt off)  Activity Tolerance:   Patient left:     Time: 1310-1335 OT Time Calculation (min): 25 min Charges:  OT General Charges $OT Visit: 1 Procedure OT Evaluation $Initial OT Evaluation Tier I: 1 Procedure OT Treatments $Therapeutic Exercise: 8-22 mins G-Codes:    Malka So 07/29/2014, 2:52 PM  [phone]

## 2014-07-29 NOTE — Progress Notes (Signed)
Subjective: 2 Days Post-Op Procedure(s) (LRB): REVERSE SHOULDER ARTHROPLASTY (Left)   Patient resting comfortably in bed this morning with son and daughter at bedside. She is alert and responds well to commands. This is a great improvement from yesterday per family and nursing. Patient signals no to any pain in her left shoulder.    Objective: Vital signs in last 24 hours: Temp:  [97.9 F (36.6 C)-100.4 F (38 C)] 98.3 F (36.8 C) (07/23 0700) Pulse Rate:  [31-119] 73 (07/23 0700) Resp:  [12-30] 18 (07/23 0700) BP: (87-200)/(32-117) 200/62 mmHg (07/22 1545) SpO2:  [89 %-100 %] 100 % (07/23 0700) Arterial Line BP: (95-175)/(32-64) 115/38 mmHg (07/23 0700) FiO2 (%):  [40 %-100 %] 40 % (07/23 0417) Weight:  [47.3 kg (104 lb 4.4 oz)-47.4 kg (104 lb 8 oz)] 47.4 kg (104 lb 8 oz) (07/23 0314)  Labs:  Recent Labs  07/28/14 0340 07/28/14 1055 07/28/14 1532 07/29/14 0300  HGB 8.4* 7.6* 6.6* 8.8*    Recent Labs  07/28/14 1532 07/29/14 0300  WBC 12.7* 10.8*  RBC 2.17* 2.97*  HCT 19.3* 25.5*  PLT 160 139*    Recent Labs  07/28/14 1532 07/29/14 0300  NA 138 136  K 4.9 4.1  CL 108 109  CO2 21* 18*  BUN 24* 19  CREATININE 1.40* 1.25*  GLUCOSE 154* 83  CALCIUM 8.0* 7.2*    Recent Labs  07/28/14 1055 07/28/14 1532  INR 1.20 1.42    Physical Exam:  Neurologically intact ABD soft Neurovascular intact Sensation intact distally Intact pulses distally Incision: dressing C/D/I and scant drainage Compartment soft  Diffuse ecchymosis and swelling in arm but no signs of infection.  Assessment/Plan:  2 Days Post-Op Procedure(s) (LRB): REVERSE SHOULDER ARTHROPLASTY (Left)  We greatly appreciate medical/Critical care management. Hopefully she will continue to improve and be extubated today if it is deemed safe. She will continue in her sling. Hand, Wrist, Elbow ROM only at this time if she is feeling up to it later today. We will see her again in the morning to check  on her progress.   Vallie Fayette, [place] Sachs 07/29/2014, 8:17 AM

## 2014-07-29 NOTE — Progress Notes (Signed)
  Echocardiogram 2D Echocardiogram has been performed.  Andrea Gilmore 07/29/2014, 10:21 AM

## 2014-07-29 NOTE — Progress Notes (Signed)
PT Cancellation Note  Patient Details Name: Andrea Gilmore MRN: [id number] DOB: 1925-08-05   Cancelled Treatment:    Reason Eval/Treat Not Completed: Medical issues which prohibited therapy (pt now intubated, elevated troponin and not appropriate for therapy at this time. Will sign off and await new order)   Melford Aase 07/29/2014, 7:27 AM Elwyn Reach, [place]

## 2014-07-29 NOTE — Progress Notes (Signed)
PULMONARY / CRITICAL CARE MEDICINE   Name: Andrea Gilmore MRN: [id number] DOB: 07-Jan-1926    ADMISSION DATE:  07/27/2014 CONSULTATION DATE:  07/28/14  REFERRING MD :  Dr. Tamera Punt   CHIEF COMPLAINT:  Acute Blood Loss Anemia post-op reverse shoulder arthroplasty   INITIAL PRESENTATION: 81 y/o F with PMH of hypothyroidism, prior breast cancer and osteroarthritis who was admitted 7/21 for planned left shoulder reverse arthroplasty.  Post-operatively 7/22, the patient developed delirium, hallucinations, fever, and hypoxia.  She was transferred to SDU and worked up for sepsis by [facility].  She was later noted to have left arm swelling and erythema.  PCCM consulted for evaluation 7/22.   STUDIES:  7/22 CXR >> left pleural effusion, hiatal hernia, gaseous distention of the stomach, L shoulder replacement  SIGNIFICANT EVENTS: 7/21  Admit for planned L should reverse arthroplasty  7/22  Early am developed delirium overnight, fever, hallucinations, hypoxia.  Arm swelling, hgb drop from 10.8 to 7.6 concerning for post-op bleeding   HISTORY OF PRESENT ILLNESS:  81 y/o F with PMH of hypothyroidism, prior breast cancer and osteroarthritis who was admitted 7/21 for planned left shoulder reverse arthroplasty.   Post-operatively 7/22 in the early am, the patient developed delirium, hallucinations, fever, and hypoxia.  She was transferred to SDU and worked up for sepsis by [facility].  She was later noted to have left arm swelling and erythema.  As well as a hgb drop from 10.8 to 7.6.  CT of the arm was ordered per [facility] and is pending to further evaluate L arm swelling.  The patient was typed and screened for 2 units PRBC's.  PCCM consulted for evaluation 7/22 with decline in clinical status.   SUBJECTIVE: off pressors overnight, more appropriate this AM, mentating and communicating via writing.  VITAL SIGNS: Temp:  [97.9 F (36.6 C)-100.4 F (38 C)] 98.3 F (36.8 C) (07/23 0700) Pulse Rate:  [31-119] 73 (07/23  0825) Resp:  [12-30] 22 (07/23 0825) BP: (87-200)/(32-117) 110/39 mmHg (07/23 0825) SpO2:  [89 %-100 %] 100 % (07/23 0825) Arterial Line BP: (95-175)/(32-64) 116/40 mmHg (07/23 0800) FiO2 (%):  [40 %-100 %] 40 % (07/23 0825) Weight:  [47.3 kg (104 lb 4.4 oz)-47.4 kg (104 lb 8 oz)] 47.4 kg (104 lb 8 oz) (07/23 0314)   HEMODYNAMICS: CVP:  [4 mmHg-7 mmHg] 5 mmHg   VENTILATOR SETTINGS: Vent Mode:  [-] PSV;CPAP FiO2 (%):  [40 %-100 %] 40 % Set Rate:  [18 bmp] 18 bmp Vt Set:  [380 mL] 380 mL PEEP:  [5 cmH20] 5 cmH20 Pressure Support:  [5 cmH20] 5 cmH20 Plateau Pressure:  [10 cmH20-11 cmH20] 10 cmH20   INTAKE / OUTPUT:  Intake/Output Summary (Last 24 hours) at 07/29/14 1030 Last data filed at 07/29/14 0800  Gross per 24 hour  Intake   3465 ml  Output    750 ml  Net   2715 ml   PHYSICAL EXAMINATION: General:  Acutely ill female, intubated but interactive. Neuro:  Arousable and following commands. HEENT:  [place]/AT, PERRL, EOM-I and MMM. Cardiovascular:  RRR, Nl S1/S2, -M/R/G. Lungs:  Coarse BS diffusely. Abdomen:  Soft, NT, ND and +BS. Musculoskeletal:  -edema and -tenderness, erythema on the left shoulder and arm with significant enlargement. Skin:  As above, otherwise intact.  LABS:  CBC  Recent Labs Lab 07/28/14 1055 07/28/14 1532 07/29/14 0300 07/29/14 0840  WBC 17.4* 12.7* 10.8*  --   HGB 7.6* 6.6* 8.8* 8.6*  HCT 22.4* 19.3* 25.5* 24.7*  PLT  229 160 139*  --    Coag's  Recent Labs Lab 07/28/14 1055 07/28/14 1532  APTT 28 30  INR 1.20 1.42   BMET  Recent Labs Lab 07/28/14 1055 07/28/14 1532 07/29/14 0300  NA 139 138 136  K 5.1 4.9 4.1  CL 108 108 109  CO2 22 21* 18*  BUN 24* 24* 19  CREATININE 1.61* 1.40* 1.25*  GLUCOSE 124* 154* 83   Electrolytes  Recent Labs Lab 07/28/14 1055 07/28/14 1532 07/29/14 0300  CALCIUM 8.9 8.0* 7.2*  MG  --   --  1.6*  PHOS  --   --  3.6   Sepsis Markers  Recent Labs Lab 07/28/14 1055 07/28/14 1534  07/29/14 0300  LATICACIDVEN 2.1* 0.8 0.6  PROCALCITON 0.95  --   --    ABG  Recent Labs Lab 07/28/14 0610 07/28/14 1549 07/29/14 0500  PHART 7.314* 7.334* 7.336*  PCO2ART 23.4* 42.2 37.0  PO2ART 70.1* 411.0* 143*   Liver Enzymes  Recent Labs Lab 07/28/14 1532  AST 70*  ALT 14  ALKPHOS 28*  BILITOT 0.9  ALBUMIN 3.0*   Cardiac Enzymes  Recent Labs Lab 07/28/14 1532  TROPONINI 0.55*   Glucose  Recent Labs Lab 07/28/14 0552 07/28/14 1531 07/28/14 1954 07/29/14 0005 07/29/14 0302 07/29/14 0732  GLUCAP 198* 138* 56 97 37 108*    Imaging Ct Head Wo Contrast  07/28/2014   CLINICAL DATA:  81 year old who underwent left shoulder arthroplasty yesterday, now with acute delirium.  EXAM: CT HEAD WITHOUT CONTRAST  TECHNIQUE: Contiguous axial images were obtained from the base of the skull through the vertex without intravenous contrast.  COMPARISON:  CT head 05/01/2014, 10/21/2010.  MRI brain 10/21/2010.  FINDINGS: Ventricular system normal in size and appearance for age. Mild to moderate cortical and deep atrophy and moderate cerebellar atrophy, progressive since 2012. Mild changes of small vessel disease of the white matter diffusely, also progressive. Physiologic calcifications in the basal ganglia, unchanged. No mass lesion. No midline shift. No acute hemorrhage or hematoma. No extra-axial fluid collections. No evidence of acute infarction.  Mild changes of hyperostosis frontalis interna. Visualized paranasal sinuses, bilateral mastoid air cells and bilateral middle ear cavities well-aerated. Bilateral carotid siphon and vertebral artery atherosclerosis.  IMPRESSION: 1. No acute intracranial abnormality. 2. Mild to moderate generalized atrophy and mild chronic microvascular ischemic changes of the white matter.   Electronically Signed   By: Evangeline Dakin M.D.   On: 07/28/2014 22:37   Ct Angio Chest Pe W/cm &/or Wo Cm  07/28/2014   CLINICAL DATA:  Acute onset of hypoxia.  Recent left shoulder surgery. Evaluate for pulmonary embolus. Initial encounter.  EXAM: CT ANGIOGRAPHY CHEST WITH CONTRAST  TECHNIQUE: Multidetector CT imaging of the chest was performed using the standard protocol during bolus administration of intravenous contrast. Multiplanar CT image reconstructions and MIPs were obtained to evaluate the vascular anatomy.  CONTRAST:  74mL OMNIPAQUE IOHEXOL 350 MG/ML SOLN  COMPARISON:  Chest radiograph performed earlier today at 3:08 p.m.  FINDINGS: There is no evidence of pulmonary embolus.  Trace bilateral pleural fluid is noted, with minimal associated atelectasis. There is no evidence of pneumothorax. No masses are identified; no abnormal focal contrast enhancement is seen.  A moderate hiatal hernia is noted, filled with fluid. The patient's endotracheal tube is seen ending 1-2 cm above the carina. Scattered coronary artery calcifications are seen. The mediastinum is otherwise unremarkable. No mediastinal lymphadenopathy is seen. No pericardial effusion is identified. The great vessels are  grossly unremarkable in appearance. Scattered calcification is noted along the aortic arch and descending thoracic aorta.  Postoperative change is noted about the left shoulder, with scattered soft tissue air and diffuse soft tissue inflammation tracking along the proximal left arm. No axillary lymphadenopathy is seen. The visualized portions of the thyroid gland are unremarkable in appearance.  The visualized portions of the liver and spleen are unremarkable. The patient is status post cholecystectomy, with clips noted along the gallbladder fossa. The visualized portions of the pancreas, adrenal glands and kidneys are grossly unremarkable. Scattered vascular calcifications are seen at the renal hila bilaterally.  No acute osseous abnormalities are seen. A left shoulder arthroplasty is grossly unremarkable in appearance, though incompletely imaged. Endplate sclerotic change is noted at  T11-T12.  Review of the MIP images confirms the above findings.  IMPRESSION: 1. No evidence of pulmonary nodules. 2. Endotracheal tube seen ending 1-2 cm above the carina. This could be retracted 1-2 cm. 3. Trace bilateral pleural fluid, with minimal associated atelectasis. 4. Moderate hiatal hernia, filled with fluid. 5. Postoperative change about the left shoulder, with scattered soft tissue air and diffuse soft tissue inflammation tracking along the proximal left arm. The visualized portions of the patient's left shoulder arthroplasty appear grossly intact, without evidence of loosening. 6. Scattered calcification along the aortic arch and distal thoracic aorta, and scattered vascular calcifications at the renal hila bilaterally.   Electronically Signed   By: Garald Balding M.D.   On: 07/28/2014 22:39   Dg Chest Port 1 View  07/29/2014   CLINICAL DATA:  Check endotracheal tube placement  EXAM: PORTABLE CHEST - 1 VIEW  COMPARISON:  07/28/2014  FINDINGS: Cardiac shadow is stable. A left central venous line is again noted and stable. Endotracheal tube is been withdrawn and now lies approximately 5.5 cm above the carina. The lungs are well aerated bilaterally with mild interstitial changes. A small left pleural effusion is again seen. Postsurgical changes in left shoulder are seen.  IMPRESSION: Interval withdrawal of the endotracheal tube. No new focal abnormality is noted.   Electronically Signed   By: Inez Catalina M.D.   On: 07/29/2014 08:14   Dg Chest Port 1 View  07/28/2014   CLINICAL DATA:  Central line placement.  EXAM: PORTABLE CHEST - 1 VIEW  COMPARISON:  Chest x-ray dated earlier same day.  FINDINGS: There has been interval placement of a left-sided central line with tip in the upper superior vena cava. Endotracheal tube has also been placed with tip slightly into the right mainstem bronchus. Cardiomediastinal silhouette is stable in size and configuration.  Suspect trace left pleural effusion. Lungs  otherwise clear. No pneumothorax seen. The left shoulder replacement hardware is stable in position. No acute osseous abnormality seen.  A hiatal hernia is better appreciated on the earlier chest x-ray. Again noted is gaseous distention of the stomach.  IMPRESSION: 1. Endotracheal tube tip projects slightly into the right mainstem bronchus. Recommend retracting approximately 2 cm for optimal radiographic positioning. 2. Left-sided internal jugular central line in place with tip at the upper margin of the SVC. 3. No pneumothorax. 4. Suspect trace left pleural effusion versus mild left basilar atelectasis. Lungs otherwise clear.  These results will be called to the ordering clinician or representative by the Radiologist Assistant, and communication documented in the PACS or zVision Dashboard.   Electronically Signed   By: Franki Cabot M.D.   On: 07/28/2014 15:25   Dg Shoulder Left Port  07/28/2014   CLINICAL  DATA:  Pain  EXAM: LEFT SHOULDER - 1 VIEW  COMPARISON:  July 27, 2014  FINDINGS: Frontal view obtained. There is a total shoulder replacement prosthesis on the left with alignment anatomic on this single view. No fracture or dislocation apparent. There is evidence of old trauma involving the lateral left clavicle with widening between the clavicle and acromion, stable. No coracoclavicular separation seen on this study.  IMPRESSION: No change in total shoulder prosthesis alignment on frontal view. No acute fracture or dislocation. Stable widening between the lateral left clavicle and acromion with evidence of old trauma involving the lateral left clavicle.   Electronically Signed   By: Lowella Grip III M.D.   On: 07/28/2014 15:19   Dg Abd Portable 1v  07/29/2014   CLINICAL DATA:  81 year old female status post enteric tube placement. Check position.  EXAM: PORTABLE ABDOMEN - 1 VIEW  COMPARISON:  Earlier Radiograph dated 07/28/2014  FINDINGS: There has been interval placement of an enteric tube with tip  in the right hemi abdomen likely in the distal stomach. There is no evidence of bowel obstruction. No free air. Moderate stool noted throughout the colon. Excreted contrast noted within the renal collecting systems bilaterally. Cholecystectomy clips. L4-L5 disc spacer and posterior fixation hardware. Degenerative changes of the spine.  IMPRESSION: Enteric tube with tip in the right abdomen likely in the distal stomach. No evidence of bowel obstruction.   Electronically Signed   By: Anner Crete M.D.   On: 07/29/2014 01:11   Dg Abd Portable 1v  07/28/2014   CLINICAL DATA:  Nasogastric tube placement  EXAM: PORTABLE ABDOMEN - 1 VIEW  COMPARISON:  Portable exam 1652 hours compared to 08/31/2012 lumbar spine radiographs  FINDINGS: Tip of endotracheal tube projects 2.7 cm above carina.  Tip of nasogastric tube is in the mid thorax ; recommend advancing tube 18 cm to place proximal side-port within the proximal stomach.  LEFT jugular central venous catheter tip projecting over SVC.  Normal heart size with calcification and tortuosity of thoracic aorta.  Lungs appear emphysematous without gross infiltrate or pleural effusion.  Minimal atelectasis LEFT base.  Prominent bowel gas and stool in colon in the upper abdomen.  Gaseous distention of stomach.  Prior lumbosacral fusion.  Bones demineralized.  IMPRESSION: Tip of nasogastric tube is in the mid thoracic esophagus; recommend advancing tube 18 cm to place proximal side-port within the proximal stomach.  Findings called to [place] on [place] on 07/28/2014 at 1706 hours.   Electronically Signed   By: Lavonia Dana M.D.   On: 07/28/2014 17:07   Ct Extrem Up Entire Arm L Wo/cm  07/28/2014   CLINICAL DATA:  81 year old female with left shoulder reverse arthroplasty. Evaluate for hematoma  EXAM: CT OF THE UPPER LEFT ARM WITHOUT CONTRAST  TECHNIQUE: Multidetector CT imaging was performed according to the standard protocol. Multiplanar CT image reconstructions were also  generated.  COMPARISON:  Radiograph dated 07/28/2014 and CT dated 07/14/2014  FINDINGS: Evaluation very limited due to streak artifact caused by metallic arthroplasty.  There is a total left shoulder reverse arthroplasty. There is no evidence of dislocation. There is extensive osteoarthritic changes of the proximal humerus. No acute fracture. Multiple small bone fragments superior to the arthroplasty are similar to prior study and may represent ectopic bone formation or loose bodies. There is diffuse soft tissue edema with small fluid surrounding the joint. No evidence of large hematoma. There is postsurgical changes of the soft tissues in the anterior upper extremity  with small pockets of soft tissue air. There is diffuse subcutaneous soft tissue stranding.  A small left pleural effusion may be present.  IMPRESSION: Postsurgical changes of left shoulder reverse arthroplasty. No significant fluid collection or hematoma identified.   Electronically Signed   By: Anner Crete M.D.   On: 07/28/2014 22:46     ASSESSMENT / PLAN:  PULMONARY OETT 7/22 >>  A: Acute Hypoxic Respiratory Failure / Respiratory Distress- new O2 requirement 7/22 L Pleural Effusion  More appropriate this AM, weaning. P:   - Anticipate extubation in AM if hemodynamics remain stable. - Family OK for short term intubation only  - Follow up CXR - Begin PS trials. - SBT in AM to extubate if all goes well. - Titrate O2 for sat.  CARDIOVASCULAR CVL L IJ TLC 7/22>>> A:  Tachycardia - in setting of respiratory distress  P:  - ICU monitoring. - DNR, no cardioversion or CPR. - D/C levophed. - Follow CVP.  RENAL A:   Metabolic Acidosis - lactic acidosis +/- component AKI AKI  P:   - Trend BMP / UOP. - Replace electrolytes as indicated. - KVO IVF.  GASTROINTESTINAL A:   Protein calorie malnutrition. P:   - OGT. - TF per nutrition.  HEMATOLOGIC A:   Acute Blood Loss Anemia - suspected L arm hemorrhage post  surgery, r/o other etiology Hx Breast Cancer P:  - T&S. - Transfuse per ICU protocol.  INFECTIOUS A:   Fever, concern for aspiration PNA vs joint infection. P:   BCx2 7/22>>> UC 7/22>>> Sputum 7/22>>>  Vancomycin 7/22>>> Zosyn 7/22>>> Sepsis protocol complete.  ENDOCRINE A:   Hyperglycemia   Hypothyroidism  P:   - ISS - CBGs   NEUROLOGIC A:    Acute Encephalopathy - in setting of delirium  P:   RASS goal: 0 PRN versed and fentanyl.  Ortho: significant erythema and enlargement of the left arm, below surgical site.  Ortho evaluated, appreciate input.  FAMILY  - Updates: Family updated at bedside.  The patient is critically ill with multiple organ systems failure and requires high complexity decision making for assessment and support, frequent evaluation and titration of therapies, application of advanced monitoring technologies and extensive interpretation of multiple databases.   Critical Care Time devoted to patient care services described in this note is  35  Minutes. This time reflects time of care of this signee Dr [name]. This critical care time does not reflect procedure time, or teaching time or supervisory time of PA/NP/Med student/Med Resident etc but could involve care discussion time.  Rush Farmer, M.D. [facility] Pulmonary/Critical Care Medicine. Pager: [phone]. After hours pager: [phone].

## 2014-07-30 ENCOUNTER — Inpatient Hospital Stay (HOSPITAL_COMMUNITY): Payer: Medicare Other

## 2014-07-30 LAB — BLOOD GAS, ARTERIAL
Acid-base deficit: 5.8 mmol/L — ABNORMAL HIGH (ref 0.0–2.0)
BICARBONATE: 18.8 meq/L — AB (ref 20.0–24.0)
DRAWN BY: 345601
FIO2: 0.4 %
LHR: 18 {breaths}/min
MECHVT: 380 mL
O2 Saturation: 99.1 %
PATIENT TEMPERATURE: 98.6
PCO2 ART: 35 mmHg (ref 35.0–45.0)
PEEP: 5 cmH2O
PH ART: 7.349 — AB (ref 7.350–7.450)
TCO2: 19.9 mmol/L (ref 0–100)
pO2, Arterial: 177 mmHg — ABNORMAL HIGH (ref 80.0–100.0)

## 2014-07-30 LAB — BASIC METABOLIC PANEL
Anion gap: 6 (ref 5–15)
BUN: 19 mg/dL (ref 6–20)
CALCIUM: 6.8 mg/dL — AB (ref 8.9–10.3)
CO2: 19 mmol/L — ABNORMAL LOW (ref 22–32)
CREATININE: 0.99 mg/dL (ref 0.44–1.00)
Chloride: 114 mmol/L — ABNORMAL HIGH (ref 101–111)
GFR, EST AFRICAN AMERICAN: 58 mL/min — AB (ref >60)
GFR, EST NON AFRICAN AMERICAN: 50 mL/min — AB (ref >60)
Glucose, Bld: 109 mg/dL — ABNORMAL HIGH (ref 65–99)
Potassium: 3.5 mmol/L (ref 3.5–5.1)
Sodium: 139 mmol/L (ref 135–145)

## 2014-07-30 LAB — CBC
HCT: 24.1 % — ABNORMAL LOW (ref 36.0–46.0)
Hemoglobin: 8.2 g/dL — ABNORMAL LOW (ref 12.0–15.0)
MCH: 29.4 pg (ref 26.0–34.0)
MCHC: 34 g/dL (ref 30.0–36.0)
MCV: 86.4 fL (ref 78.0–100.0)
Platelets: 130 10*3/uL — ABNORMAL LOW (ref 150–400)
RBC: 2.79 MIL/uL — ABNORMAL LOW (ref 3.87–5.11)
RDW: 15.8 % — AB (ref 11.5–15.5)
WBC: 10 10*3/uL (ref 4.0–10.5)

## 2014-07-30 LAB — GLUCOSE, CAPILLARY
Glucose-Capillary: 110 mg/dL — ABNORMAL HIGH (ref 65–99)
Glucose-Capillary: 104 mg/dL — ABNORMAL HIGH (ref 65–99)
Glucose-Capillary: 84 mg/dL (ref 65–99)
Glucose-Capillary: 80 mg/dL (ref 65–99)
Glucose-Capillary: 100 mg/dL — ABNORMAL HIGH (ref 65–99)

## 2014-07-30 LAB — MAGNESIUM: MAGNESIUM: 2.1 mg/dL (ref 1.7–2.4)

## 2014-07-30 LAB — PROCALCITONIN: Procalcitonin: 0.95 ng/mL

## 2014-07-30 LAB — PHOSPHORUS: Phosphorus: 1.9 mg/dL — ABNORMAL LOW (ref 2.5–4.6)

## 2014-07-30 MED ORDER — POTASSIUM CHLORIDE 20 MEQ/15ML (10%) PO SOLN
20.0000 meq | ORAL | Status: AC
  Administered 2014-07-30 (×2): 20 meq
  Filled 2014-07-30 (×2): qty 15

## 2014-07-30 MED ORDER — SODIUM PHOSPHATE 3 MMOLE/ML IV SOLN
20.0000 mmol | Freq: Once | INTRAVENOUS | Status: AC
  Administered 2014-07-30: 20 mmol via INTRAVENOUS
  Filled 2014-07-30: qty 6.67

## 2014-07-30 NOTE — Progress Notes (Signed)
RT removed arterial line per Dr. Kayleen Memos. After site was compressed for 20 mins site was cleaned. No apparent complications. RT will continue to monitor.

## 2014-07-30 NOTE — Evaluation (Signed)
Clinical/Bedside Swallow Evaluation Patient Details  Name: Andrea Gilmore MRN: [id number] Date of Birth: 06-28-1925  Today's Date: 07/30/2014 Time: SLP Start Time (ACUTE ONLY): 1150 SLP Stop Time (ACUTE ONLY): 1227 SLP Time Calculation (min) (ACUTE ONLY): 37 min  Past Medical History:  Past Medical History  Diagnosis Date  . Arthritis   . Thyroid disease   . Cancer     breast cancer   Past Surgical History:  Past Surgical History  Procedure Laterality Date  . Abdominal hysterectomy    . Tonsillectomy    . Shoulder surgery Bilateral   . Back surgery    . Bilateral carpal tunnel release    . Cholecystectomy    . Eye surgery Right   . Reverse shoulder arthroplasty Left 07/27/2014    Procedure: REVERSE SHOULDER ARTHROPLASTY;  Surgeon: Tania Ade, MD;  Location: [place];  Service: Orthopedics;  Laterality: Left;  Left reverse total shoulder arthroplasty   HPI:  This is an 81yo female patient admitted on 7/21 by the orthopedic team to undergo left reverse shoulder arthroplasty for end-stage arthritis with irrepairable rotator cuff injury. Past medical history significant for osteoarthritis, hypothyroidism and prior breast cancer. At baseline patient is ambulatory, continues to drive and continues to work outside the home. According to nursing staff patient did well in the immediate postop period but overnight developed what sounds like significant rigors, altered mentation as evidenced by acute delirium and hallucinations, fever of 101.9 degrees Fahrenheit and unexplained hypoxemia. Chest x-ray reveals a possible evolving left pleural effusion. Pt intubated for 24 hours, extubated this date. ST consulted for evaluation of swallowing -   Assessment / Plan / Recommendation Clinical Impression  Pt presenting with acute reversible pharyngeal dysphagia post extubation this morning. Noted hoarse and weak vocal quality along with congested volitional cough. Thin liquid trials by teaspoon and  cup both resulted in immediate reflexive coughing and throat clearing. Suspect decreased pharyngeal sensation. Pt appeared to be adequately protecting airway with nectar thick liquid trials as no overt signs or symptoms of aspiration were seen. Recommemd diet implementation of dysphagia 3 (mechanical soft) and nectar thick liquids. Medicines whole with puree. Educated pt and family on post intubation swallow changes and how risk of aspiration increases. Safe swallow strategies reviewed. ST to follow up for upgraded PO trials. Possible MBS indicated if symptoms persist    Aspiration Risk  Moderate    Diet Recommendation Nectar;Dysphagia 3 (Mech soft)   Medication Administration: Whole meds with puree Compensations: Minimize environmental distractions;Small sips/bites;Slow rate    Other  Recommendations Oral Care Recommendations: Oral care BID Other Recommendations: Order thickener from pharmacy   Follow Up Recommendations       Frequency and Duration min 2x/week  2 weeks   Pertinent Vitals/Pain Yes, RN administered pain medicine     SLP Swallow Goals     Swallow Study Prior Functional Status       General Date of Onset: 07/27/14 Other Pertinent Information: This is an 81yo female patient admitted on 7/21 by the orthopedic team to undergo left reverse shoulder arthroplasty for end-stage arthritis with irrepairable rotator cuff injury. Past medical history significant for osteoarthritis, hypothyroidism and prior breast cancer. At baseline patient is ambulatory, continues to drive and continues to work outside the home. According to nursing staff patient did well in the immediate postop period but overnight developed what sounds like significant rigors, altered mentation as evidenced by acute delirium and hallucinations, fever of 101.9 degrees Fahrenheit and unexplained hypoxemia. Chest x-ray  reveals a possible evolving left pleural effusion. Pt intubated for 24 hours, extubated this date.  ST consulted for evaluation of swallowing - Type of Study: Bedside swallow evaluation Diet Prior to this Study: NPO Temperature Spikes Noted: Yes History of Recent Intubation: Yes Length of Intubations (days): 1 days Date extubated: 07/30/14 Behavior/Cognition: Cooperative;Alert;Pleasant mood Oral Cavity - Dentition: Adequate natural dentition/normal for age Self-Feeding Abilities: Able to feed self;Needs assist Patient Positioning: Upright in bed (complaints of back pain with fully upright positioning ) Baseline Vocal Quality: Hoarse;Low vocal intensity Volitional Cough: Congested;Weak Volitional Swallow: Able to elicit    Oral/Motor/Sensory Function Overall Oral Motor/Sensory Function: Appears within functional limits for tasks assessed Labial ROM: Within Functional Limits Labial Symmetry: Within Functional Limits Labial Strength: Within Functional Limits Lingual ROM: Within Functional Limits Lingual Symmetry: Within Functional Limits Lingual Strength: Within Functional Limits   Ice Chips Ice chips: Within functional limits Presentation: Spoon   Thin Liquid Thin Liquid: Impaired Presentation: Cup;Spoon Pharyngeal  Phase Impairments: Suspected delayed Swallow;Multiple swallows;Throat Clearing - Immediate;Cough - Immediate    Nectar Thick Nectar Thick Liquid: Within functional limits   Honey Thick Honey Thick Liquid: Not tested   Puree Puree: Within functional limits   Solid   GO    Solid: Impaired Presentation: Self Fed Oral Phase Functional Implications: Oral residue      Arvil Chaco MA, [place] 07/30/2014,12:41 PM

## 2014-07-30 NOTE — Progress Notes (Signed)
Subjective: 3 Days Post-Op Procedure(s) (LRB): REVERSE SHOULDER ARTHROPLASTY (Left)  Patient is resting comfortably in bed with daughter bedside. She is awake and alert. She responds well to all questions. She denies any pain in her shoulder.   Objective: Vital signs in last 24 hours: Temp:  [97.7 F (36.5 C)-100.6 F (38.1 C)] 98.7 F (37.1 C) (07/24 0800) Pulse Rate:  [59-81] 68 (07/24 0900) Resp:  [13-25] 15 (07/24 0900) BP: (110-164)/(41-69) 164/61 mmHg (07/24 0748) SpO2:  [100 %] 100 % (07/24 0900) Arterial Line BP: (94-155)/(31-61) 155/49 mmHg (07/24 0900) FiO2 (%):  [40 %] 40 % (07/24 0800) Weight:  [47.9 kg (105 lb 9.6 oz)] 47.9 kg (105 lb 9.6 oz) (07/24 0338)  Labs:  Recent Labs  07/28/14 1532 07/29/14 0300 07/29/14 0840 07/29/14 1935 07/30/14 0358  HGB 6.6* 8.8* 8.6* 8.6* 8.2*    Recent Labs  07/29/14 1935 07/30/14 0358  WBC 11.2* 10.0  RBC 2.82* 2.79*  HCT 24.6* 24.1*  PLT 130* 130*    Recent Labs  07/29/14 0300 07/30/14 0358  NA 136 139  K 4.1 3.5  CL 109 114*  CO2 18* 19*  BUN 19 19  CREATININE 1.25* 0.99  GLUCOSE 83 109*  CALCIUM 7.2* 6.8*    Recent Labs  07/28/14 1055 07/28/14 1532  INR 1.20 1.42    Physical Exam:  Neurologically intact ABD soft Neurovascular intact Sensation intact distally Intact pulses distally Dorsiflexion/Plantar flexion intact Incision: dressing C/D/I and scant drainage No cellulitis present Compartment soft  Assessment/Plan:  3 Days Post-Op Procedure(s) (LRB): REVERSE SHOULDER ARTHROPLASTY (Left) We greatly appreciate medical/Critical care management. Patient continues to improve and hopefully will be extubated today if deemed safe by critical care. She will continue in her sling. Hand, Wrist, Elbow ROM only at this time if she is feeling up to it later today. We will see her again in the morning to check on her progress.  Fontaine Kossman, Larwance Sachs 07/30/2014, 9:54 AM

## 2014-07-30 NOTE — Progress Notes (Signed)
[facility] ADULT ICU REPLACEMENT PROTOCOL FOR AM LAB REPLACEMENT ONLY  The patient does apply for the [facility] Adult ICU Electrolyte Replacment Protocol based on the criteria listed below:   1. Is GFR >/= 40 ml/min? Yes.    Patient's GFR today is 50 2. Is urine output >/= 0.5 ml/kg/hr for the last 6 hours? Yes.   Patient's UOP is 0.7 ml/kg/hr 3. Is BUN < 60 mg/dL? Yes.    Patient's BUN today is 19 4. Abnormal electrolyte(s): K+3.5  Phos 1.9 5. Ordered repletion with: protocol 6. If a panic level lab has been reported, has the CCM MD in charge been notified? No..   Physician:  Maretta Los, [place] 07/30/2014 5:05 AM

## 2014-07-30 NOTE — Procedures (Signed)
Extubation Procedure Note  Patient Details:   Name: Andrea Gilmore DOB: 1925/08/02 MRN: [id number]   Airway Documentation:     Evaluation  O2 sats: stable throughout Complications: No apparent complications Patient did tolerate procedure well. Bilateral Breath Sounds: Diminished   Yes   Pt was extubated to a 4 LPM [place]. Cuff leak was heard. No stridor noted. BS were diminished. RN was at bedside with RT. RT will continue to monitor.  Renato Gails [facility] 07/30/2014, 10:38 AM

## 2014-07-30 NOTE — Progress Notes (Signed)
PULMONARY / CRITICAL CARE MEDICINE   Name: Andrea Gilmore MRN: [id number] DOB: 1925/06/14    ADMISSION DATE:  07/27/2014 CONSULTATION DATE:  07/28/14  REFERRING MD :  Dr. Tamera Punt   CHIEF COMPLAINT:  Acute Blood Loss Anemia post-op reverse shoulder arthroplasty   INITIAL PRESENTATION: 81 y/o F with PMH of hypothyroidism, prior breast cancer and osteroarthritis who was admitted 7/21 for planned left shoulder reverse arthroplasty.  Post-operatively 7/22, the patient developed delirium, hallucinations, fever, and hypoxia.  She was transferred to SDU and worked up for sepsis by [facility].  She was later noted to have left arm swelling and erythema.  PCCM consulted for evaluation 7/22.   STUDIES:  7/22 CXR >> left pleural effusion, hiatal hernia, gaseous distention of the stomach, L shoulder replacement  SIGNIFICANT EVENTS: 7/21  Admit for planned L should reverse arthroplasty  7/22  Early am developed delirium overnight, fever, hallucinations, hypoxia.  Arm swelling, hgb drop from 10.8 to 7.6 concerning for post-op bleeding   HISTORY OF PRESENT ILLNESS:  81 y/o F with PMH of hypothyroidism, prior breast cancer and osteroarthritis who was admitted 7/21 for planned left shoulder reverse arthroplasty.   Post-operatively 7/22 in the early am, the patient developed delirium, hallucinations, fever, and hypoxia.  She was transferred to SDU and worked up for sepsis by [facility].  She was later noted to have left arm swelling and erythema.  As well as a hgb drop from 10.8 to 7.6.  CT of the arm was ordered per [facility] and is pending to further evaluate L arm swelling.  The patient was typed and screened for 2 units PRBC's.  PCCM consulted for evaluation 7/22 with decline in clinical status.   SUBJECTIVE: off pressors overnight, more appropriate this AM, mentating and communicating via writing.  VITAL SIGNS: Temp:  [97.7 F (36.5 C)-100.6 F (38.1 C)] 98.7 F (37.1 C) (07/24 0800) Pulse Rate:  [59-81] 68 (07/24  0900) Resp:  [13-25] 15 (07/24 0900) BP: (110-164)/(41-69) 164/61 mmHg (07/24 0748) SpO2:  [100 %] 100 % (07/24 0900) Arterial Line BP: (94-155)/(31-61) 155/49 mmHg (07/24 0900) FiO2 (%):  [40 %] 40 % (07/24 0800) Weight:  [47.9 kg (105 lb 9.6 oz)] 47.9 kg (105 lb 9.6 oz) (07/24 0338)   HEMODYNAMICS:     VENTILATOR SETTINGS: Vent Mode:  [-] PSV;CPAP FiO2 (%):  [40 %] 40 % Set Rate:  [18 bmp] 18 bmp Vt Set:  [380 mL] 380 mL PEEP:  [5 cmH20] 5 cmH20 Pressure Support:  [5 cmH20] 5 cmH20 Plateau Pressure:  [10 cmH20-13 cmH20] 11 cmH20   INTAKE / OUTPUT:  Intake/Output Summary (Last 24 hours) at 07/30/14 1020 Last data filed at 07/30/14 0800  Gross per 24 hour  Intake   4305 ml  Output    975 ml  Net   3330 ml   PHYSICAL EXAMINATION: General:  Acutely ill female, intubated but interactive. Neuro:  Arousable and following commands. HEENT:  [place]/AT, PERRL, EOM-I and MMM. Cardiovascular:  RRR, Nl S1/S2, -M/R/G. Lungs:  Coarse BS diffusely. Abdomen:  Soft, NT, ND and +BS. Musculoskeletal:  -edema and -tenderness, erythema on the left shoulder and arm with significant enlargement. Skin:  As above, otherwise intact.  LABS:  CBC  Recent Labs Lab 07/29/14 0300 07/29/14 0840 07/29/14 1935 07/30/14 0358  WBC 10.8*  --  11.2* 10.0  HGB 8.8* 8.6* 8.6* 8.2*  HCT 25.5* 24.7* 24.6* 24.1*  PLT 139*  --  130* 130*   Coag's  Recent Labs Lab  07/28/14 1055 07/28/14 1532  APTT 28 30  INR 1.20 1.42   BMET  Recent Labs Lab 07/28/14 1532 07/29/14 0300 07/30/14 0358  NA 138 136 139  K 4.9 4.1 3.5  CL 108 109 114*  CO2 21* 18* 19*  BUN 24* 19 19  CREATININE 1.40* 1.25* 0.99  GLUCOSE 154* 83 109*   Electrolytes  Recent Labs Lab 07/28/14 1532 07/29/14 0300 07/30/14 0358  CALCIUM 8.0* 7.2* 6.8*  MG  --  1.6* 2.1  PHOS  --  3.6 1.9*   Sepsis Markers  Recent Labs Lab 07/28/14 1055 07/28/14 1534 07/29/14 0300  LATICACIDVEN 2.1* 0.8 0.6  PROCALCITON 0.95  --    --    ABG  Recent Labs Lab 07/28/14 1549 07/29/14 0500 07/30/14 0408  PHART 7.334* 7.336* 7.349*  PCO2ART 42.2 37.0 35.0  PO2ART 411.0* 143* 177*   Liver Enzymes  Recent Labs Lab 07/28/14 1532  AST 70*  ALT 14  ALKPHOS 28*  BILITOT 0.9  ALBUMIN 3.0*   Cardiac Enzymes  Recent Labs Lab 07/28/14 1532  TROPONINI 0.55*   Glucose  Recent Labs Lab 07/29/14 0732 07/29/14 1110 07/29/14 1625 07/29/14 1930 07/29/14 2321 07/30/14 0348  GLUCAP 108* 109* 128* 105* 110* 104*    Imaging Dg Chest Port 1 View  07/30/2014   CLINICAL DATA:  Ventilator dependent respiratory failure. Three days postop left shoulder arthroplasty.  EXAM: PORTABLE CHEST - 1 VIEW  COMPARISON:  07/29/2014 and earlier, including CTA chest 07/28/2014.  FINDINGS: Endotracheal tube tip in satisfactory position approximately 4 cm above the carina. Left jugular central venous catheter tip projects over the upper SVC, unchanged. Nasogastric tube courses below the diaphragm into the stomach. Cardiac silhouette mildly enlarged but stable. Mild atelectasis at the left lung base and small left pleural effusion, slightly improved since yesterday. Lungs otherwise clear. Left shoulder arthroplasty with anatomic alignment. Severe degenerative changes in right shoulder.  IMPRESSION: 1. Support apparatus satisfactory. 2. Mild atelectasis at the left lung base and small left pleural effusion, improved since yesterday. 3. No new abnormalities.   Electronically Signed   By: Evangeline Dakin M.D.   On: 07/30/2014 09:22     ASSESSMENT / PLAN:  PULMONARY OETT 7/22 >>  A: Acute Hypoxic Respiratory Failure / Respiratory Distress- new O2 requirement 7/22 L Pleural Effusion  More appropriate this AM, weaning. P:   - SBT to extubate today. - IS per RT protocol. - Family OK for short term intubation only  - Titrate O2 for sat. - Aspiration precautions.  CARDIOVASCULAR CVL L IJ TLC 7/22>>> A:  Tachycardia - in setting of  respiratory distress  P:  - ICU monitoring. - DNR, no cardioversion or CPR. - D/C levophed. - Follow CVP.  RENAL A:   Metabolic Acidosis - lactic acidosis +/- component AKI AKI  P:   - Trend BMP / UOP. - Replace electrolytes as indicated. - KVO IVF.  GASTROINTESTINAL A:   Protein calorie malnutrition. P:   - D/C OGT for extubation. - D/C TF.  HEMATOLOGIC A:   Acute Blood Loss Anemia - suspected L arm hemorrhage post surgery, r/o other etiology Hx Breast Cancer P:  - T&S. - Transfuse per ICU protocol.  INFECTIOUS A:   Fever, concern for aspiration PNA vs joint infection. P:   BCx2 7/22>>> UC 7/22>>> Sputum 7/22>>>  Vancomycin 7/22>>> Zosyn 7/22>>> Sepsis protocol complete. Recheck procalcitonin and will likely dial down abx in AM if continues to do well.  ENDOCRINE A:  Hyperglycemia   Hypothyroidism  P:   - ISS - CBGs   NEUROLOGIC A:    Acute Encephalopathy - in setting of delirium  P:   RASS goal: 0 PRN versed and fentanyl.  Ortho: significant erythema and enlargement of the left arm, below surgical site.  Ortho evaluated, appreciate input.  FAMILY  - Updates: Family updated at bedside.  The patient is critically ill with multiple organ systems failure and requires high complexity decision making for assessment and support, frequent evaluation and titration of therapies, application of advanced monitoring technologies and extensive interpretation of multiple databases.   Critical Care Time devoted to patient care services described in this note is  35  Minutes. This time reflects time of care of this signee Dr [name]. This critical care time does not reflect procedure time, or teaching time or supervisory time of PA/NP/Med student/Med Resident etc but could involve care discussion time.  Rush Farmer, M.D. [facility] Pulmonary/Critical Care Medicine. Pager: [phone]. After hours pager: [phone].

## 2014-07-31 ENCOUNTER — Inpatient Hospital Stay (HOSPITAL_COMMUNITY): Payer: Medicare Other

## 2014-07-31 ENCOUNTER — Encounter (HOSPITAL_COMMUNITY): Payer: Self-pay | Admitting: Certified Registered Nurse Anesthetist

## 2014-07-31 LAB — BLOOD GAS, ARTERIAL
ACID-BASE DEFICIT: 5.6 mmol/L — AB (ref 0.0–2.0)
Bicarbonate: 19.2 mEq/L — ABNORMAL LOW (ref 20.0–24.0)
DRAWN BY: 345601
FIO2: 0.4
O2 Saturation: 98.5 %
PATIENT TEMPERATURE: 98.6
PCO2 ART: 37 mmHg (ref 35.0–45.0)
PEEP: 5 cmH2O
RATE: 18 resp/min
TCO2: 20.4 mmol/L (ref 0–100)
VT: 380 mL
pH, Arterial: 7.336 — ABNORMAL LOW (ref 7.350–7.450)
pO2, Arterial: 143 mmHg — ABNORMAL HIGH (ref 80.0–100.0)

## 2014-07-31 LAB — CBC
HCT: 24 % — ABNORMAL LOW (ref 36.0–46.0)
HEMOGLOBIN: 8.1 g/dL — AB (ref 12.0–15.0)
MCH: 30 pg (ref 26.0–34.0)
MCHC: 33.8 g/dL (ref 30.0–36.0)
MCV: 88.9 fL (ref 78.0–100.0)
Platelets: 156 10*3/uL (ref 150–400)
RBC: 2.7 MIL/uL — ABNORMAL LOW (ref 3.87–5.11)
RDW: 15.9 % — AB (ref 11.5–15.5)
WBC: 8.9 10*3/uL (ref 4.0–10.5)

## 2014-07-31 LAB — LEGIONELLA ANTIGEN, URINE

## 2014-07-31 LAB — BASIC METABOLIC PANEL
ANION GAP: 4 — AB (ref 5–15)
BUN: 13 mg/dL (ref 6–20)
CO2: 19 mmol/L — ABNORMAL LOW (ref 22–32)
CREATININE: 0.94 mg/dL (ref 0.44–1.00)
Calcium: 6.9 mg/dL — ABNORMAL LOW (ref 8.9–10.3)
Chloride: 118 mmol/L — ABNORMAL HIGH (ref 101–111)
GFR calc non Af Amer: 53 mL/min — ABNORMAL LOW (ref >60)
Glucose, Bld: 88 mg/dL (ref 65–99)
Potassium: 3.7 mmol/L (ref 3.5–5.1)
Sodium: 141 mmol/L (ref 135–145)

## 2014-07-31 LAB — GLUCOSE, CAPILLARY
Glucose-Capillary: 94 mg/dL (ref 65–99)
Glucose-Capillary: 78 mg/dL (ref 65–99)
Glucose-Capillary: 77 mg/dL (ref 65–99)
Glucose-Capillary: 79 mg/dL (ref 65–99)

## 2014-07-31 LAB — URINE CULTURE: Culture: 40000

## 2014-07-31 LAB — CULTURE, RESPIRATORY W GRAM STAIN
Culture: NO GROWTH
Special Requests: NORMAL

## 2014-07-31 LAB — PROCALCITONIN: PROCALCITONIN: 0.59 ng/mL

## 2014-07-31 LAB — MAGNESIUM: MAGNESIUM: 1.8 mg/dL (ref 1.7–2.4)

## 2014-07-31 LAB — CULTURE, RESPIRATORY

## 2014-07-31 LAB — PHOSPHORUS: Phosphorus: 2.2 mg/dL — ABNORMAL LOW (ref 2.5–4.6)

## 2014-07-31 MED ORDER — HYDROCODONE-ACETAMINOPHEN 5-325 MG PO TABS
0.5000 | ORAL_TABLET | Freq: Four times a day (QID) | ORAL | Status: DC | PRN
  Administered 2014-07-31 – 2014-08-01 (×3): 0.5 via ORAL
  Filled 2014-07-31 (×4): qty 1

## 2014-07-31 MED ORDER — CETYLPYRIDINIUM CHLORIDE 0.05 % MT LIQD
7.0000 mL | Freq: Two times a day (BID) | OROMUCOSAL | Status: DC
  Administered 2014-07-31 – 2014-08-04 (×10): 7 mL via OROMUCOSAL

## 2014-07-31 MED ORDER — ALBUTEROL SULFATE (2.5 MG/3ML) 0.083% IN NEBU
2.5000 mg | INHALATION_SOLUTION | RESPIRATORY_TRACT | Status: DC | PRN
  Administered 2014-07-31: 2.5 mg via RESPIRATORY_TRACT
  Filled 2014-07-31: qty 3

## 2014-07-31 MED ORDER — CIPROFLOXACIN HCL 250 MG PO TABS: 250.0000 mg | ORAL_TABLET | Freq: Every day | ORAL | Status: DC

## 2014-07-31 MED ORDER — SODIUM PHOSPHATE 3 MMOLE/ML IV SOLN
30.0000 mmol | Freq: Once | INTRAVENOUS | Status: AC
  Administered 2014-07-31: 30 mmol via INTRAVENOUS
  Filled 2014-07-31: qty 10

## 2014-07-31 MED ORDER — POTASSIUM CHLORIDE CRYS ER 20 MEQ PO TBCR
40.0000 meq | EXTENDED_RELEASE_TABLET | Freq: Once | ORAL | Status: AC
  Administered 2014-07-31: 40 meq via ORAL
  Filled 2014-07-31: qty 2

## 2014-07-31 MED ORDER — CIPROFLOXACIN HCL 250 MG PO TABS
250.0000 mg | ORAL_TABLET | Freq: Every day | ORAL | Status: AC
  Administered 2014-07-31 – 2014-08-03 (×4): 250 mg via ORAL
  Filled 2014-07-31 (×4): qty 1

## 2014-07-31 MED ORDER — FUROSEMIDE 10 MG/ML IJ SOLN
40.0000 mg | Freq: Once | INTRAMUSCULAR | Status: AC
  Administered 2014-07-31: 40 mg via INTRAVENOUS
  Filled 2014-07-31: qty 4

## 2014-07-31 MED ORDER — LEVOTHYROXINE SODIUM 50 MCG PO TABS
50.0000 ug | ORAL_TABLET | Freq: Every day | ORAL | Status: DC
  Administered 2014-08-01 – 2014-08-05 (×5): 50 ug via ORAL
  Filled 2014-07-31 (×6): qty 1

## 2014-07-31 MED ORDER — PANTOPRAZOLE SODIUM 40 MG PO TBEC
40.0000 mg | DELAYED_RELEASE_TABLET | Freq: Every day | ORAL | Status: DC
  Administered 2014-07-31 – 2014-08-05 (×6): 40 mg via ORAL
  Filled 2014-07-31 (×6): qty 1

## 2014-07-31 NOTE — Evaluation (Signed)
Physical Therapy Evaluation Patient Details Name: Andrea Gilmore MRN: [id number] DOB: October 14, 1925 Today's Date: 07/31/2014   History of Present Illness  Pt admitted for L reverse TSA on 7/21. Transferred to ICU 7/22 due to delirium, hallucinations, fever, hypoxia and anemia and intubated. Extubated 7/24  Clinical Impression  Pt pleasant but anxious with mobility. Sats dropping to 85% on 4L with mobility and 100% at rest, unable to determine accuracy as pleth not a great wave form. Pt eager to return to PLOF and will benefit from acute therapy for mobility, balance, gait, and function to decrease burden of care. Rn present and educated for mobility and vital signs end of session. Pt educated for sling position and sequence of events since admission.     Follow Up Recommendations Supervision/Assistance - 24 hour;CIR    Equipment Recommendations  None recommended by PT    Recommendations for Other Services       Precautions / Restrictions Precautions Precautions: Fall;Shoulder Required Braces or Orthoses: Sling Restrictions Weight Bearing Restrictions: Yes LUE Weight Bearing: Non weight bearing      Mobility  Bed Mobility Overal bed mobility: Needs Assistance Bed Mobility: Supine to Sit     Supine to sit: Min assist     General bed mobility comments: assist to semi roll right to elevate trunk and cues for sequence and moving legs to EOB  Transfers Overall transfer level: Needs assistance   Transfers: Sit to/from Stand;Stand Pivot Transfers Sit to Stand: Mod assist Stand pivot transfers: Max assist;+2 physical assistance       General transfer comment: pt stood x 3 from bed with RLE blocked max cues and assist for pt to pull up on P.T. arm with her right arm, max cues and assist for anterior translation of pelvis and upright posture. Pt with use of bedpan EOB for BM with total assist for pericare. pt anxious with mobility and needs max cues throughout for sequence and  safety. 2 person assist to pivot to chair  Ambulation/Gait                Stairs            Wheelchair Mobility    Modified Rankin (Stroke Patients Only)       Balance Overall balance assessment: Needs assistance   Sitting balance-Leahy Scale: Fair       Standing balance-Leahy Scale: Zero                               Pertinent Vitals/Pain Pain Assessment: No/denies pain  HR 76-85 sats 85-100% on 4L    Home Living Family/patient expects to be discharged to:: Private residence Living Arrangements: Alone Available Help at Discharge: Family;Available 24 hours/day;Personal care attendant Type of Home: House Home Access: Stairs to enter Entrance Stairs-Rails: Can reach both Entrance Stairs-Number of Steps: 3 Home Layout: One level Home Equipment: Walker - 2 wheels;Walker - 4 wheels;Cane - single point;Wheelchair - manual;Shower seat      Prior Function Level of Independence: Independent with assistive device(s)         Comments: pt ambulated outdoors with cane     Hand Dominance        Extremity/Trunk Assessment   Upper Extremity Assessment: Defer to OT evaluation           Lower Extremity Assessment: Generalized weakness      Cervical / Trunk Assessment: Kyphotic  Communication   Communication: No difficulties  Cognition Arousal/Alertness: Awake/alert Behavior During Therapy: Anxious Overall Cognitive Status: Impaired/Different from baseline Area of Impairment: Memory;Problem solving     Memory: Decreased short-term memory       Problem Solving: Difficulty sequencing      General Comments      Exercises        Assessment/Plan    PT Assessment Patient needs continued PT services  PT Diagnosis Generalized weakness;Difficulty walking   PT Problem List Decreased strength;Decreased activity tolerance;Decreased balance;Decreased mobility;Cardiopulmonary status limiting activity;Decreased  coordination;Decreased safety awareness  PT Treatment Interventions Gait training;Functional mobility training;Therapeutic activities;Therapeutic exercise;Balance training;Patient/family education   PT Goals (Current goals can be found in the Care Plan section) Acute Rehab PT Goals Patient Stated Goal: be able to return to independent function PT Goal Formulation: With patient Time For Goal Achievement: 08/14/14 Potential to Achieve Goals: Fair    Frequency Min 3X/week   Barriers to discharge Decreased caregiver support      Co-evaluation               End of Session Equipment Utilized During Treatment: Gait belt;Other (comment) (LUE sling) Activity Tolerance: Patient limited by fatigue Patient left: in chair;with chair alarm set;with family/visitor present;with nursing/sitter in room;with call bell/phone within reach Nurse Communication: Precautions;Weight bearing status;Mobility status         Time: 0912-0943 PT Time Calculation (min) (ACUTE ONLY): 31 min   Charges:   PT Evaluation $Initial PT Evaluation Tier I: 1 Procedure PT Treatments $Therapeutic Activity: 8-22 mins   PT G Codes:        Melford Aase 07/31/2014, 11:28 AM Elwyn Reach, [place]

## 2014-07-31 NOTE — Progress Notes (Signed)
Rehab Admissions Coordinator Note:  Patient was screened by Retta Diones for appropriateness for an Inpatient Acute Rehab Consult.  At this time, we are recommending [place] or [facility] therapies.  Given that patient has had a total shoulder replacement, unlikely that she would require the coordinated approach of an inpatient rehab program.  Call me for questions.  Jodell Cipro M 07/31/2014, 11:52 AM  I can be reached at [phone].

## 2014-07-31 NOTE — Progress Notes (Signed)
Only gave 50mg  of fentanyl per PA Rahul, wasted the other 50 in sink with Verlin Grills, RN.

## 2014-07-31 NOTE — Progress Notes (Signed)
Occupational Therapy Treatment Patient Details Name: GIANELLE MCCAUL MRN: [id number] DOB: November 07, 1925 Today's Date: 07/31/2014    History of present illness Pt admitted for L reverse TSA on 7/21. Transferred to ICU 7/22 due to delirium, hallucinations, fever, hypoxia and anemia and intubated. Extubated 7/24   OT comments  Pt with excellent participation. L elbow.wrist/hand ROM completed. Family education on proper positioning of sling and LUE while OOB. Pt very motivated to return to PLOF. Recommend CIR to maximize functional level of independence.   Follow Up Recommendations  Supervision/Assistance - 24 hour;CIR    Equipment Recommendations  3 in 1 bedside comode    Recommendations for Other Services Rehab consult    Precautions / Restrictions Precautions Precautions: Fall;Shoulder Type of Shoulder Precautions: conservative Shoulder Interventions: Shoulder sling/immobilizer;Off for dressing/bathing/exercises Precaution Booklet Issued: Yes (comment) Required Braces or Orthoses: Sling Restrictions LUE Weight Bearing: Non weight bearing       Mobility Bed Mobility Overal bed mobility: Needs Assistance Bed Mobility: Supine to Sit     Supine to sit: Min assist     General bed mobility comments: OOB in chair  Transfers Overall transfer level: Needs assistance   Transfers: Sit to/from Stand Sit to Stand: Mod assist Stand pivot transfers: Max assist;+2 physical assistance       General transfer comment: Pt has B AFO she uses to ambulate    Balance Overall balance assessment: Needs assistance   Sitting balance-Leahy Scale: Fair       Standing balance-Leahy Scale: Poor                     ADL Overall ADL's : Needs assistance/impaired     Grooming: Moderate assistance   Upper Body Bathing: Moderate assistance   Lower Body Bathing: Moderate assistance;Sit to/from stand   Upper Body Dressing : Maximal assistance   Lower Body Dressing: Maximal  assistance   Toilet Transfer: Moderate assistance   Toileting- Clothing Manipulation and Hygiene: Maximal assistance       Functional mobility during ADLs: Moderate assistance General ADL Comments: Began educating pt/son on compensatory techniques. Educated on proper positioning of sling and use of pillows for support                                      Cognition   Behavior During Therapy: WFL for tasks assessed/performed Overall Cognitive Status: Impaired/Different from baseline Area of Impairment: Memory;Problem solving     Memory: Decreased short-term memory        Problem Solving: Slow processing General Comments: appears to be closer to baseline level    Extremity/Trunk Assessment  Upper Extremity Assessment Upper Extremity Assessment: Defer to OT evaluation   Lower Extremity Assessment Lower Extremity Assessment: Generalized weakness   Cervical / Trunk Assessment Cervical / Trunk Assessment: Kyphotic    Exercises Shoulder Exercises Elbow Flexion: Left;10 reps;Seated;AROM Elbow Extension: Left;10 reps;Seated;AROM Wrist Flexion: Left;AROM;10 reps Wrist Extension: AROM;Left;10 reps Digit Composite Flexion: AROM;Left;10 reps Composite Extension: AROM;Left;10 reps Donning/doffing shirt without moving shoulder: Maximal assistance Method for sponge bathing under operated UE: Maximal assistance Donning/doffing sling/immobilizer: Maximal assistance Correct positioning of sling/immobilizer: Moderate assistance ROM for elbow, wrist and digits of operated UE: Minimal assistance Sling wearing schedule (on at all times/off for ADL's): Minimal assistance Proper positioning of operated UE when showering: Minimal assistance   Shoulder Instructions Shoulder Instructions Donning/doffing shirt without moving shoulder: Maximal assistance Method for sponge bathing  under operated UE: Maximal assistance Donning/doffing sling/immobilizer: Maximal  assistance Correct positioning of sling/immobilizer: Moderate assistance ROM for elbow, wrist and digits of operated UE: Minimal assistance Sling wearing schedule (on at all times/off for ADL's): Minimal assistance Proper positioning of operated UE when showering: Minimal assistance     General Comments      Pertinent Vitals/ Pain       Pain Assessment: Faces Faces Pain Scale: Hurts little more Pain Location: L shoulder Pain Descriptors / Indicators: Grimacing Pain Intervention(s): Limited activity within patient's tolerance;Repositioned  Home Living Family/patient expects to be discharged to:: Private residence Living Arrangements: Alone Available Help at Discharge: Family;Available 24 hours/day;Personal care attendant Type of Home: House Home Access: Stairs to enter [employer] of Steps: 3 Entrance Stairs-Rails: Can reach both Home Layout: One level     Bathroom Shower/Tub: Teacher, early years/pre: Standard     Home Equipment: Environmental consultant - 2 wheels;Walker - 4 wheels;Cane - single point;Wheelchair - manual;Shower seat          Prior Functioning/Environment Level of Independence: Independent with assistive device(s)        Comments: pt ambulated outdoors with cane   Frequency Min 3X/week     Progress Toward Goals  OT Goals(current goals can now be found in the care plan section)  Progress towards OT goals: Progressing toward goals  Acute Rehab OT Goals Patient Stated Goal: be able to return to independent function OT Goal Formulation: With family Time For Goal Achievement: 08/12/14 Potential to Achieve Goals: Good ADL Goals Pt Will Perform Grooming: with min assist;standing Pt Will Perform Upper Body Bathing: with min assist;sitting Pt Will Perform Lower Body Bathing: with min assist;sit to/from stand Pt Will Perform Upper Body Dressing: with min assist;sitting Pt Will Perform Lower Body Dressing: with min assist;sit to/from stand Pt  Will Transfer to Toilet: with min assist;ambulating;regular height toilet Pt Will Perform Toileting - Clothing Manipulation and hygiene: with min assist;sit to/from stand Pt/caregiver will Perform Home Exercise Program: Left upper extremity;Independently Additional ADL Goal #1: Pt and caregiver will be independent in donning and doffing sling.  Plan Discharge plan needs to be updated    Co-evaluation                 End of Session Equipment Utilized During Treatment: Gait belt   Activity Tolerance Patient tolerated treatment well   Patient Left in chair;with call bell/phone within reach;with family/visitor present   Nurse Communication Mobility status;Precautions;Weight bearing status        Time: 5573-2202 OT Time Calculation (min): 30 min  Charges: OT General Charges $OT Visit: 1 Procedure OT Treatments $Self Care/Home Management : 8-22 mins $Therapeutic Activity: 8-22 mins  Johnross Nabozny,[place] 07/31/2014, 12:58 PM   [facility], OTR/L  [phone] 07/31/2014

## 2014-07-31 NOTE — Progress Notes (Signed)
Dr. Nelda Marseille made aware pt has had 3rd loose stool. Not initiating C-diff protocol at this time. Will continue to monitor.

## 2014-07-31 NOTE — Progress Notes (Signed)
Utilization Review Completed.Andrea Gilmore T7/25/2016  

## 2014-07-31 NOTE — Clinical Documentation Improvement (Signed)
  Protein calorie malnutrition is documented in the medical record. "Insert OGT. TF per nutrition. BMI= 21.32  Please specify the degree of malnutrition if possible.  Mild Moderate Severe Unable to suspect or determine  Thank you!  Pryor Montes, BSN, RN [place] HIM/Clinical Documentation Specialist [email or web address] [phone]

## 2014-07-31 NOTE — Progress Notes (Addendum)
PCCM Interval Progress Note  Asked by RN to assess pt at bedside for SOB.  Pt had been fine earlier in the shift and throughout the day.  RN assisted her onto bedpan and shortly thereafter, pt developed increased WOB with subjective feeling of SOB.  She states it is hard for her to catch her breath which is new from earlier this evening.  She denies chest pain but does report "tightness".  No fevers/chills/sweats, cough.  O2 requirements have remained the same (2L via [place]).   BP 174/119 mmHg  Pulse 69  Temp(Src) 97.8 F (36.6 C) (Oral)  Resp 23  Ht 4\' 11"  (1.499 m)  Wt 47.9 kg (105 lb 9.6 oz)  SpO2 100%  Gen: Elderly female, in mild distress. Lungs:  Basilar crackles with faint expiratory wheeze, otherwise clear.  Mildly increased work of breathing, not speaking in complete sentences. Heart:  RRR, no M/R/G. Extremities:  No edema.   Acute hypoxic respiratory failure with mild respiratory distress. Plan: Continue supplemental O2 as needed to maintain SpO2 > 92%. If continues to have increased WOB despite measures below, can try BiPAP. STAT CXR and EKG. Lasix 40mg . Fentanyl 60mcg. Albuterol q2hrs PRN. Hold transfer to SDU for now.   Montey Hora, [facility] Pager: [phone]  or [phone] 07/31/2014, 11:27 PM

## 2014-07-31 NOTE — Progress Notes (Signed)
Speech Language Pathology Treatment: Dysphagia  Patient Details Name: Andrea Gilmore MRN: [id number] DOB: [birth year] Today's Date: 07/31/2014 Time: 4709-2957 SLP Time Calculation (min) (ACUTE ONLY): 16 min  Assessment / Plan / Recommendation Clinical Impression  Dysphagia intervention with son present. Pt alert with increased awareness, stating "I lost a day" and inquiring what happened. Confirms continued mild throat clear. No overt indications of penetration or aspiration with thin via cup or straw. SLP educated/reviewed relationship between intubation and swallow function and reinterated importance of small sips especially with straws. Upgraded diet to regular texture and thin liquids, pills with thin (if large, whole in applesauce), small sips. No follow up ST needed.   HPI Other Pertinent Information: This is an 81yo female patient admitted on 7/21 by the orthopedic team to undergo left reverse shoulder arthroplasty for end-stage arthritis with irrepairable rotator cuff injury. Past medical history significant for osteoarthritis, hypothyroidism and prior breast cancer. At baseline patient is ambulatory, continues to drive and continues to work outside the home. According to nursing staff patient did well in the immediate postop period but overnight developed what sounds like significant rigors, altered mentation as evidenced by acute delirium and hallucinations, fever of 101.9 degrees Fahrenheit and unexplained hypoxemia. Chest x-ray reveals a possible evolving left pleural effusion. Pt intubated for 24 hours, extubated this date. ST consulted for evaluation of swallowing -   Pertinent Vitals Pain Assessment: No/denies pain  SLP Plan  Discharge SLP treatment due to (comment);All goals met    Recommendations Diet recommendations: Regular;Thin liquid Liquids provided via: Cup;Straw Medication Administration: Whole meds with liquid Supervision: Patient able to self feed;Intermittent  supervision to cue for compensatory strategies Compensations: Small sips/bites;Slow rate Postural Changes and/or Swallow Maneuvers: Seated upright 90 degrees              Oral Care Recommendations: Oral care BID Follow up Recommendations: None Plan: Discharge SLP treatment due to (comment);All goals met    GO     Andrea Gilmore 07/31/2014, 12:00 PM  Andrea Gilmore Colvin Caroli.[employer] [phone]

## 2014-07-31 NOTE — Discharge Instructions (Signed)

## 2014-07-31 NOTE — Progress Notes (Signed)
   PATIENT ID: Andrea Gilmore   4 Days Post-Op Procedure(s) (LRB): REVERSE SHOULDER ARTHROPLASTY (Left)  Subjective: Extubated yesterday.  No significant shoulder pain. Feeling a little SOB this am after nasal canula d/c'd.    Objective:  Filed Vitals:   07/31/14 0800  BP: 137/110  Pulse: 73  Temp:   Resp: 19     Alert and oriented.  Left shoulder dressing with scant dry blood.  Distally left hand neurovascularly int  Labs:   Recent Labs  07/29/14 0300 07/29/14 0840 07/29/14 1935 07/30/14 0358 07/31/14 0500  HGB 8.8* 8.6* 8.6* 8.2* 8.1*   Recent Labs  07/30/14 0358 07/31/14 0500  WBC 10.0 8.9  RBC 2.79* 2.70*  HCT 24.1* 24.0*  PLT 130* 156   Recent Labs  07/30/14 0358 07/31/14 0500  NA 139 141  K 3.5 3.7  CL 114* 118*  CO2 19* 19*  BUN 19 13  CREATININE 0.99 0.94  GLUCOSE 109* 88  CALCIUM 6.8* 6.9*    Assessment and Plan: Postoperative day 4 status post left reverse total shoulder replacement complicated by postoperative decline  Now extubated and mentating normally Critical Care greatly appreciated. Likely moving towards transfer to stepdown or floor bed  And can start some physical therapy and OT We will see how she is doing and reconsider disposition plan for discharge pending PT.

## 2014-07-31 NOTE — Care Management Important Message (Signed)
Important Message  Patient Details  Name: Andrea Gilmore MRN: [id number] Date of Birth: [birth year]   Medicare Important Message Given:  Yes-second notification given    Pricilla Handler 07/31/2014, 2:15 PM

## 2014-07-31 NOTE — Progress Notes (Signed)
[facility] Progress Note Patient Name: Andrea Gilmore DOB: 03-02-1925 MRN: [id number]   Date of Service  07/31/2014  HPI/Events of Note  Hypophosphatemia  eICU Interventions  Phos replaced     Intervention Category Intermediate Interventions: Electrolyte abnormality - evaluation and management  Zayin Valadez 07/31/2014, 5:43 AM

## 2014-07-31 NOTE — Progress Notes (Signed)
PULMONARY / CRITICAL CARE MEDICINE   Name: Andrea Gilmore MRN: [id number] DOB: 09/20/1925    ADMISSION DATE:  07/27/2014 CONSULTATION DATE:  07/28/14  REFERRING MD :  Dr. Tamera Punt   CHIEF COMPLAINT:  Acute Blood Loss Anemia post-op reverse shoulder arthroplasty   INITIAL PRESENTATION: 81 y/o F with PMH of hypothyroidism, prior breast cancer and osteroarthritis who was admitted 7/21 for planned left shoulder reverse arthroplasty.  Post-operatively 7/22, the patient developed delirium, hallucinations, fever, and hypoxia.  She was transferred to SDU and worked up for sepsis by [facility].  She was later noted to have left arm swelling and erythema.  PCCM consulted for evaluation 7/22.   STUDIES:  7/22 CXR >> left pleural effusion, hiatal hernia, gaseous distention of the stomach, L shoulder replacement  SIGNIFICANT EVENTS: 7/21  Admit for planned L should reverse arthroplasty  7/22  Early am developed delirium overnight, fever, hallucinations, hypoxia.  Arm swelling, hgb drop from 10.8 to 7.6 concerning for post-op bleeding   HISTORY OF PRESENT ILLNESS:  81 y/o F with PMH of hypothyroidism, prior breast cancer and osteroarthritis who was admitted 7/21 for planned left shoulder reverse arthroplasty.   Post-operatively 7/22 in the early am, the patient developed delirium, hallucinations, fever, and hypoxia.  She was transferred to SDU and worked up for sepsis by [facility].  She was later noted to have left arm swelling and erythema.  As well as a hgb drop from 10.8 to 7.6.  CT of the arm was ordered per [facility] and is pending to further evaluate L arm swelling.  The patient was typed and screened for 2 units PRBC's.  PCCM consulted for evaluation 7/22 with decline in clinical status.   SUBJECTIVE: off pressors overnight, more appropriate this AM, mentating and communicating via writing.  VITAL SIGNS: Temp:  [98.4 F (36.9 C)-98.8 F (37.1 C)] 98.6 F (37 C) (07/25 0800) Pulse Rate:  [59-80] 64 (07/25  1100) Resp:  [13-27] 27 (07/25 1100) BP: (91-190)/(33-110) 122/69 mmHg (07/25 1100) SpO2:  [93 %-100 %] 99 % (07/25 1100)   HEMODYNAMICS:     VENTILATOR SETTINGS:     INTAKE / OUTPUT:  Intake/Output Summary (Last 24 hours) at 07/31/14 1140 Last data filed at 07/31/14 1000  Gross per 24 hour  Intake   3910 ml  Output   1700 ml  Net   2210 ml   PHYSICAL EXAMINATION: General:  Elderly appearing female, NAD. Neuro:  Awake and interactive, following all commands. HEENT:  [place]/AT, PERRL, EOM-I and MMM. Cardiovascular:  RRR, Nl S1/S2, -M/R/G. Lungs:  CTA bilaterally. Abdomen:  Soft, NT, ND and +BS. Musculoskeletal:  -edema and -tenderness, erythema on the left shoulder and arm with significant enlargement. Skin:  As above, otherwise intact.  LABS:  CBC  Recent Labs Lab 07/29/14 1935 07/30/14 0358 07/31/14 0500  WBC 11.2* 10.0 8.9  HGB 8.6* 8.2* 8.1*  HCT 24.6* 24.1* 24.0*  PLT 130* 130* 156   Coag's  Recent Labs Lab 07/28/14 1055 07/28/14 1532  APTT 28 30  INR 1.20 1.42   BMET  Recent Labs Lab 07/29/14 0300 07/30/14 0358 07/31/14 0500  NA 136 139 141  K 4.1 3.5 3.7  CL 109 114* 118*  CO2 18* 19* 19*  BUN 19 19 13   CREATININE 1.25* 0.99 0.94  GLUCOSE 83 109* 88   Electrolytes  Recent Labs Lab 07/29/14 0300 07/30/14 0358 07/31/14 0500  CALCIUM 7.2* 6.8* 6.9*  MG 1.6* 2.1 1.8  PHOS 3.6 1.9* 2.2*  Sepsis Markers  Recent Labs Lab 07/28/14 1055 07/28/14 1534 07/29/14 0300 07/30/14 1100 07/31/14 0500  LATICACIDVEN 2.1* 0.8 0.6  --   --   PROCALCITON 0.95  --   --  0.95 0.59   ABG  Recent Labs Lab 07/28/14 1549 07/29/14 0318 07/30/14 0408  PHART 7.334* 7.336* 7.349*  PCO2ART 42.2 37.0 35.0  PO2ART 411.0* 143* 177*   Liver Enzymes  Recent Labs Lab 07/28/14 1532  AST 70*  ALT 14  ALKPHOS 28*  BILITOT 0.9  ALBUMIN 3.0*   Cardiac Enzymes  Recent Labs Lab 07/28/14 1532  TROPONINI 0.55*   Glucose  Recent Labs Lab  07/29/14 2321 07/30/14 0348 07/30/14 0753 07/30/14 1149 07/30/14 1938 07/31/14 0851  GLUCAP 110* 104* 84 80 100* 94    Imaging I reviewed CXR myself, no acute disease.  ASSESSMENT / PLAN:  PULMONARY OETT 7/22 >>  A: Acute Hypoxic Respiratory Failure / Respiratory Distress- new O2 requirement 7/22 L Pleural Effusion  More appropriate this AM, weaning. P:   - IS per RT protocol. - Family OK for short term intubation only. - Titrate O2 for sat. - Aspiration precautions.  CARDIOVASCULAR CVL L IJ TLC 7/22>>> A:  Tachycardia - in setting of respiratory distress  P:  - SDU monitoring. - DNR, no cardioversion or CPR, short term intubation only. - D/C levophed. - Follow CVP.  RENAL A:   Metabolic Acidosis - lactic acidosis +/- component AKI AKI  P:   - Trend BMP / UOP. - Replace electrolytes as indicated. - KVO IVF.  GASTROINTESTINAL A:   Protein calorie malnutrition. P:   - D/C OGT for extubation. - D/C TF.  HEMATOLOGIC A:   Acute Blood Loss Anemia - suspected L arm hemorrhage post surgery, r/o other etiology Hx Breast Cancer P:  - T&S. - Transfuse per ICU protocol.  INFECTIOUS A:   Fever, concern for aspiration PNA vs joint infection. P:   BCx2 7/22>>>NTD UC 7/22>>>pseudomonas pan sensitive Sputum 7/22>>>NTD  Vancomycin 7/22>>>7/25 Zosyn 7/22>>>7/25 Cipro 7/25>>> Sepsis protocol complete. Procalcitonin 0.59. D/C vancomycin and zosyn. Start cipro.  ENDOCRINE A:   Hyperglycemia   Hypothyroidism  P:   - ISS - CBGs   NEUROLOGIC A:    Acute Encephalopathy - in setting of delirium  P:   RASS goal: 0 PRN versed and fentanyl.  Ortho: significant erythema and enlargement of the left arm, below surgical site.  Ortho evaluated, appreciate input.  FAMILY  - Updates: Patient and family updated at bedside.  Discussed with TRH, transfer to SDU and back to [facility] with PCCM signing off 7/26.  Rush Farmer, M.D. [facility] Pulmonary/Critical  Care Medicine. Pager: [phone]. After hours pager: [phone].

## 2014-07-31 NOTE — Progress Notes (Addendum)
Nutrition Follow-up  DOCUMENTATION CODES:   Severe malnutrition in context of chronic illness Pt meets criteria for SEVERE MALNUTRITION in the context of chronic illness as evidenced by severe fat and muscle depletion.  INTERVENTION:   Magic cup TID with meals, each supplement provides 290 kcal and 9 grams of protein   NUTRITION DIAGNOSIS:   Inadequate oral intake related to poor appetite as evidenced by meal completion < 50%.  Ongoing.   GOAL:   Patient will meet greater than or equal to 90% of their needs  Not met  MONITOR:   PO intake, Supplement acceptance, Labs, I & O's  REASON FOR ASSESSMENT:   Consult Enteral/tube feeding initiation and management  ASSESSMENT:   Patient admitted on 7/21 with left shoulder pain and dysfunction from left shoulder endstage bone on bone rotator cuff tear arthropathy. S/P surgery on 7/21.   Pt extubated, now on PO diet.  Spoke with pt and family, appetite poor. Pt is excited to try magic cup.   Diet Order:  Diet regular Room service appropriate?: Yes; Fluid consistency:: Thin  Skin:  Reviewed, no issues  Last BM:  7/25  Height:   Ht Readings from Last 1 Encounters:  07/31/14 _0  (1.499 m)    Weight:   Wt Readings from Last 1 Encounters:  07/30/14 105 lb 9.6 oz (47.9 kg)    Ideal Body Weight:  44.5 kg  Wt Readings from Last 10 Encounters:  07/30/14 105 lb 9.6 oz (47.9 kg)  07/19/14 99 lb 9.6 oz (45.178 kg)  06/20/14 101 lb 1.6 oz (45.859 kg)  04/14/13 108 lb 6.4 oz (49.17 kg)  04/15/12 112 lb 9.6 oz (51.075 kg)  10/07/11 114 lb (51.71 kg)  09/04/11 113 lb 8 oz (51.483 kg)  08/29/11 113 lb 12.8 oz (51.619 kg)  06/25/11 108 lb 14.4 oz (49.397 kg)  11/25/10 120 lb 8 oz (54.658 kg)    BMI:  Body mass index is 21.32 kg/(m^2).  Estimated Nutritional Needs:   Kcal:  1300-1500  Protein:  65-75 grams  Fluid:  > 1.5 L/day  EDUCATION NEEDS:   No education needs identified at this time  [place],  [place], [place] Pager [phone] After Hours Pager

## 2014-08-01 ENCOUNTER — Encounter (HOSPITAL_COMMUNITY)
Admission: RE | Disposition: A | Discharge status: Skilled Nursing Facility | Payer: Self-pay | Source: Ambulatory Visit | Attending: Orthopedic Surgery

## 2014-08-01 ENCOUNTER — Inpatient Hospital Stay (HOSPITAL_COMMUNITY): Payer: Medicare Other

## 2014-08-01 DIAGNOSIS — I214 Non-ST elevation (NSTEMI) myocardial infarction: Secondary | ICD-10-CM

## 2014-08-01 DIAGNOSIS — Z853 Personal history of malignant neoplasm of breast: Secondary | ICD-10-CM

## 2014-08-01 DIAGNOSIS — M12512 Traumatic arthropathy, left shoulder: Secondary | ICD-10-CM

## 2014-08-01 DIAGNOSIS — E46 Unspecified protein-calorie malnutrition: Secondary | ICD-10-CM

## 2014-08-01 DIAGNOSIS — A419 Sepsis, unspecified organism: Secondary | ICD-10-CM

## 2014-08-01 DIAGNOSIS — J948 Other specified pleural conditions: Secondary | ICD-10-CM

## 2014-08-01 DIAGNOSIS — E876 Hypokalemia: Secondary | ICD-10-CM

## 2014-08-01 DIAGNOSIS — E872 Acidosis: Secondary | ICD-10-CM

## 2014-08-01 DIAGNOSIS — J90 Pleural effusion, not elsewhere classified: Secondary | ICD-10-CM | POA: Diagnosis present

## 2014-08-01 DIAGNOSIS — J810 Acute pulmonary edema: Secondary | ICD-10-CM | POA: Diagnosis present

## 2014-08-01 DIAGNOSIS — R410 Disorientation, unspecified: Secondary | ICD-10-CM

## 2014-08-01 DIAGNOSIS — J9690 Respiratory failure, unspecified, unspecified whether with hypoxia or hypercapnia: Secondary | ICD-10-CM | POA: Diagnosis present

## 2014-08-01 DIAGNOSIS — E038 Other specified hypothyroidism: Secondary | ICD-10-CM

## 2014-08-01 DIAGNOSIS — J9601 Acute respiratory failure with hypoxia: Secondary | ICD-10-CM

## 2014-08-01 DIAGNOSIS — G9341 Metabolic encephalopathy: Secondary | ICD-10-CM

## 2014-08-01 DIAGNOSIS — Z862 Personal history of diseases of the blood and blood-forming organs and certain disorders involving the immune mechanism: Secondary | ICD-10-CM

## 2014-08-01 DIAGNOSIS — E43 Unspecified severe protein-calorie malnutrition: Secondary | ICD-10-CM

## 2014-08-01 DIAGNOSIS — I5021 Acute systolic (congestive) heart failure: Secondary | ICD-10-CM

## 2014-08-01 DIAGNOSIS — R0600 Dyspnea, unspecified: Secondary | ICD-10-CM

## 2014-08-01 LAB — PHOSPHORUS: Phosphorus: 3.6 mg/dL (ref 2.5–4.6)

## 2014-08-01 LAB — BASIC METABOLIC PANEL
ANION GAP: 12 (ref 5–15)
BUN: 14 mg/dL (ref 6–20)
CALCIUM: 8.1 mg/dL — AB (ref 8.9–10.3)
CO2: 19 mmol/L — ABNORMAL LOW (ref 22–32)
CREATININE: 1.02 mg/dL — AB (ref 0.44–1.00)
Chloride: 111 mmol/L (ref 101–111)
GFR calc Af Amer: 56 mL/min — ABNORMAL LOW (ref >60)
GFR calc non Af Amer: 48 mL/min — ABNORMAL LOW (ref >60)
GLUCOSE: 225 mg/dL — AB (ref 65–99)
Potassium: 3.6 mmol/L (ref 3.5–5.1)
Sodium: 142 mmol/L (ref 135–145)

## 2014-08-01 LAB — PROCALCITONIN: Procalcitonin: 0.32 ng/mL

## 2014-08-01 LAB — CBC
HCT: 29.7 % — ABNORMAL LOW (ref 36.0–46.0)
Hemoglobin: 10 g/dL — ABNORMAL LOW (ref 12.0–15.0)
MCH: 29.9 pg (ref 26.0–34.0)
MCHC: 33.7 g/dL (ref 30.0–36.0)
MCV: 88.7 fL (ref 78.0–100.0)
PLATELETS: 282 10*3/uL (ref 150–400)
RBC: 3.35 MIL/uL — ABNORMAL LOW (ref 3.87–5.11)
RDW: 15.7 % — AB (ref 11.5–15.5)
WBC: 14.6 10*3/uL — ABNORMAL HIGH (ref 4.0–10.5)

## 2014-08-01 LAB — TROPONIN I
TROPONIN I: 0.43 ng/mL — AB (ref <0.031)
Troponin I: 2.38 ng/mL (ref <0.031)
Troponin I: 2.06 ng/mL (ref <0.031)
Troponin I: 1.48 ng/mL (ref <0.031)

## 2014-08-01 LAB — TSH: TSH: 3.119 u[IU]/mL (ref 0.350–4.500)

## 2014-08-01 LAB — HEPARIN LEVEL (UNFRACTIONATED): Heparin Unfractionated: <0.1 IU/mL — ABNORMAL LOW (ref 0.30–0.70)

## 2014-08-01 LAB — BRAIN NATRIURETIC PEPTIDE

## 2014-08-01 LAB — MAGNESIUM: MAGNESIUM: 1.7 mg/dL (ref 1.7–2.4)

## 2014-08-01 SURGERY — LEFT HEART CATH AND CORONARY ANGIOGRAPHY
Anesthesia: LOCAL

## 2014-08-01 MED ORDER — POTASSIUM CHLORIDE CRYS ER 20 MEQ PO TBCR
40.0000 meq | EXTENDED_RELEASE_TABLET | Freq: Three times a day (TID) | ORAL | Status: AC
  Administered 2014-08-01 (×2): 40 meq via ORAL
  Filled 2014-08-01 (×2): qty 2

## 2014-08-01 MED ORDER — SODIUM CHLORIDE 0.9 % IJ SOLN
3.0000 mL | Freq: Two times a day (BID) | INTRAMUSCULAR | Status: DC
  Administered 2014-08-01 – 2014-08-05 (×9): 3 mL via INTRAVENOUS

## 2014-08-01 MED ORDER — ASPIRIN 81 MG PO CHEW
81.0000 mg | CHEWABLE_TABLET | ORAL | Status: AC
  Administered 2014-08-01: 81 mg via ORAL
  Filled 2014-08-01: qty 1

## 2014-08-01 MED ORDER — HYDROCODONE-ACETAMINOPHEN 5-325 MG PO TABS
0.5000 | ORAL_TABLET | Freq: Once | ORAL | Status: AC
  Administered 2014-08-01: 0.5 via ORAL
  Filled 2014-08-01: qty 1

## 2014-08-01 MED ORDER — HYDROCODONE-ACETAMINOPHEN 5-325 MG PO TABS
1.0000 | ORAL_TABLET | Freq: Four times a day (QID) | ORAL | Status: DC | PRN
  Administered 2014-08-01: 0.5 via ORAL
  Administered 2014-08-01 – 2014-08-05 (×12): 1 via ORAL
  Filled 2014-08-01 (×12): qty 1

## 2014-08-01 MED ORDER — HEPARIN (PORCINE) IN NACL 100-0.45 UNIT/ML-% IJ SOLN
650.0000 [IU]/h | INTRAMUSCULAR | Status: DC
  Administered 2014-08-01: 550 [IU]/h via INTRAVENOUS
  Filled 2014-08-01: qty 250

## 2014-08-01 MED ORDER — FUROSEMIDE 10 MG/ML IJ SOLN
40.0000 mg | Freq: Three times a day (TID) | INTRAMUSCULAR | Status: AC
  Administered 2014-08-01 (×2): 40 mg via INTRAVENOUS
  Filled 2014-08-01 (×2): qty 4

## 2014-08-01 MED ORDER — HEPARIN BOLUS VIA INFUSION
2000.0000 [IU] | Freq: Once | INTRAVENOUS | Status: AC
  Administered 2014-08-01: 2000 [IU] via INTRAVENOUS
  Filled 2014-08-01: qty 2000

## 2014-08-01 MED ORDER — SODIUM CHLORIDE 0.9 % IJ SOLN: 3.0000 mL | INTRAMUSCULAR | Status: DC | PRN

## 2014-08-01 MED ORDER — SODIUM CHLORIDE 0.9 % IV SOLN: 250.0000 mL | INTRAVENOUS | Status: DC | PRN

## 2014-08-01 MED ORDER — SODIUM CHLORIDE 0.9 % IV SOLN
INTRAVENOUS | Status: DC
  Administered 2014-08-01: 5 mL/h via INTRAVENOUS

## 2014-08-01 MED ORDER — HEPARIN BOLUS VIA INFUSION
3000.0000 [IU] | Freq: Once | INTRAVENOUS | Status: AC
Start: 2014-08-01 — End: 2014-08-01
  Administered 2014-08-01: 3000 [IU] via INTRAVENOUS
  Filled 2014-08-01: qty 3000

## 2014-08-01 NOTE — Progress Notes (Signed)
ANTICOAGULATION CONSULT NOTE - Initial Consult  Pharmacy Consult for heparin Indication: chest pain/ACS  Allergies  Allergen Reactions  . Morphine And Related Rash  . Codeine Rash  . Blue Dyes (Parenteral) Itching  . Red Dye Itching    Patient Measurements: Height: 4\' 11"  (149.9 cm) Weight: 105 lb 9.6 oz (47.9 kg) IBW/kg (Calculated) : 43.2 Heparin Dosing Weight: 47.9  Vital Signs: Temp: 97.4 F (36.3 C) (07/26 0700) Temp Source: Axillary (07/26 0700) BP: 138/61 mmHg (07/26 1000) Pulse Rate: 97 (07/26 1000)  Labs:  Recent Labs  07/30/14 0358 07/31/14 0500 08/01/14 0043 08/01/14 0852  HGB 8.2* 8.1* 10.0*  --   HCT 24.1* 24.0* 29.7*  --   PLT 130* 156 282  --   CREATININE 0.99 0.94 1.02*  --   TROPONINI  --   --  0.43* 2.38*    Estimated Creatinine Clearance: 26.5 mL/min (by C-G formula based on Cr of 1.02).   Medical History: Past Medical History  Diagnosis Date  . Arthritis   . Thyroid disease   . Cancer     breast cancer    Medications:  Prescriptions prior to admission  Medication Sig Dispense Refill Last Dose  . aspirin 325 MG buffered tablet Take 325 mg by mouth daily.    07/19/14  . bimatoprost (LUMIGAN) 0.01 % SOLN Place 1 drop into both eyes every other day.    07/19/14  . Calcium Carbonate-Vitamin D (CALCIUM + D PO) Take 1 tablet by mouth daily.   1 week  . diazepam (VALIUM) 5 MG tablet Take 5 mg by mouth every 6 (six) hours as needed for anxiety.    1 week  . fish oil-omega-3 fatty acids 1000 MG capsule Take 1 g by mouth daily.   07/19/14  . Glucos-Chondroit-Hyaluron-MSM (GLUCOSAMINE CHONDROITIN JOINT) TABS Take 1 tablet by mouth daily.   07/19/14  . HYDROcodone-acetaminophen (NORCO/VICODIN) 5-325 MG per tablet Take 0.5-1 tablets by mouth every 6 (six) hours as needed for moderate pain.   07/26/2014 at Unknown time  . levothyroxine (SYNTHROID, LEVOTHROID) 50 MCG tablet Take 1 tablet by mouth daily.  1 07/19/14  . Menthol-Methyl Salicylate (PAIN  RELIEVING RUB EX) Apply 1 application topically as needed (pain).   07/19/14  . Multiple Vitamins-Minerals (MULTIVITAMIN WITH MINERALS) tablet Take 1 tablet by mouth daily.     07/19/14  . OVER THE COUNTER MEDICATION Take 1 tablet by mouth daily. Brain & Liver Vitamin   07/19/14  . diclofenac sodium (VOLTAREN) 1 % GEL Apply 1 application topically 4 (four) times daily as needed.   0 1 week    Assessment: 81 yo F admitted 07/27/2014 L shoulder surgery who experienced increased confusion, increased agitation, tachycardic, tachypneic and hypertensive, elevated lactic acid, WBC 14.5, Tm 101.9.  Started Vancomycin and Zosyn for presumed sepsis. Developed SOB & chest tightness 7/25 evening, increased RR overnight, and Tp to 2.38 this AM.   Pharmacy consulted to dose heparin.  Anticoagulation: ACS Hgb 10, plt WNL Was on VTE ppx ASA 325 BID & SCDs (d/c 7/22), has only been on SCDs since then Given no anticoagulant therapy in past few days, will bolus heparin 60 units/kg and start gtt at 12 units/kg/h  Goal of Therapy:  Heparin level 0.3-0.7 units/ml Monitor platelets by anticoagulation protocol: Yes   Plan:  Give 3000 units bolus x 1 Start heparin infusion at 550 units/hr Check anti-Xa level in 8 hours and daily while on heparin Continue to monitor H&H and platelets  Benjamine Mola  T Hillel Card 08/01/2014,10:52 AM

## 2014-08-01 NOTE — Consult Note (Signed)
CONSULT NOTE  Date: 08/01/2014               Patient Name:  Andrea Gilmore MRN: [id number]  DOB: 1925-06-05 Age / Sex: 81 y.o., female        PCP: Mayra Neer Primary Cardiologist: New / [place]             Referring Physician: Sherral Hammers,               Reason for Consult: Post op MI following shoulder surgery            History of Present Illness: Patient is a 81 y.o. female with a PMHx of hx of breast cancer, , who was admitted to [facility] on 07/27/2014 for shoulder surgery.  She had post op respiratory distress on POD 1 which resolved but she had recurrent respiratory distress on POD 4.  Troponin levels were mildly elevated initially but increased to 2.5 this am ECG shows new poor R wave progression and TWI in the lateral leads  She is free of CP but continues to have dyspnea.  Currently on BIPAP .     Medications: Outpatient medications: Prescriptions prior to admission  Medication Sig Dispense Refill Last Dose  . aspirin 325 MG buffered tablet Take 325 mg by mouth daily.    07/19/14  . bimatoprost (LUMIGAN) 0.01 % SOLN Place 1 drop into both eyes every other day.    07/19/14  . Calcium Carbonate-Vitamin D (CALCIUM + D PO) Take 1 tablet by mouth daily.   1 week  . diazepam (VALIUM) 5 MG tablet Take 5 mg by mouth every 6 (six) hours as needed for anxiety.    1 week  . fish oil-omega-3 fatty acids 1000 MG capsule Take 1 g by mouth daily.   07/19/14  . Glucos-Chondroit-Hyaluron-MSM (GLUCOSAMINE CHONDROITIN JOINT) TABS Take 1 tablet by mouth daily.   07/19/14  . HYDROcodone-acetaminophen (NORCO/VICODIN) 5-325 MG per tablet Take 0.5-1 tablets by mouth every 6 (six) hours as needed for moderate pain.   07/26/2014 at Unknown time  . levothyroxine (SYNTHROID, LEVOTHROID) 50 MCG tablet Take 1 tablet by mouth daily.  1 07/19/14  . Menthol-Methyl Salicylate (PAIN RELIEVING RUB EX) Apply 1 application topically as needed (pain).   07/19/14  . Multiple Vitamins-Minerals (MULTIVITAMIN WITH  MINERALS) tablet Take 1 tablet by mouth daily.     07/19/14  . OVER THE COUNTER MEDICATION Take 1 tablet by mouth daily. Brain & Liver Vitamin   07/19/14  . diclofenac sodium (VOLTAREN) 1 % GEL Apply 1 application topically 4 (four) times daily as needed.   0 1 week    Current medications: Current Facility-Administered Medications  Medication Dose Route Frequency Provider Last Rate Last Dose  . acetaminophen (TYLENOL) tablet 650 mg  650 mg Oral Q6H PRN Grier Mitts, PA-C   650 mg at 07/31/14 7062   Or  . acetaminophen (TYLENOL) suppository 650 mg  650 mg Rectal Q6H PRN Grier Mitts, PA-C   650 mg at 07/28/14 0641  . albuterol (PROVENTIL) (2.5 MG/3ML) 0.083% nebulizer solution 2.5 mg  2.5 mg Nebulization Q2H PRN Rahul P Desai, PA-C   2.5 mg at 07/31/14 2351  . antiseptic oral rinse (CPC / CETYLPYRIDINIUM CHLORIDE 0.05%) solution 7 mL  7 mL Mouth Rinse BID Tania Ade, MD   7 mL at 08/01/14 1000  . ciprofloxacin (CIPRO) tablet 250 mg  250 mg Oral Q breakfast Tania Ade, MD   250 mg at 08/01/14 0835  .  fentaNYL (SUBLIMAZE) injection 100 mcg  100 mcg Intravenous Q2H PRN Rush Farmer, MD   50 mcg at 07/31/14 2338  . fentaNYL (SUBLIMAZE) injection 12.5 mcg  12.5 mcg Intravenous Q2H PRN Samella Parr, NP   12.5 mcg at 07/31/14 5621  . heparin ADULT infusion 100 units/mL (25000 units/250 mL)  550 Units/hr Intravenous Continuous Wynelle Fanny, [facility]      . heparin bolus via infusion 3,000 Units  3,000 Units Intravenous Once Wynelle Fanny, [facility]      . HYDROcodone-acetaminophen (NORCO/VICODIN) 5-325 MG per tablet 0.5 tablet  0.5 tablet Oral Q6H PRN Tania Ade, MD   0.5 tablet at 08/01/14 0835  . levothyroxine (SYNTHROID, LEVOTHROID) tablet 50 mcg  50 mcg Oral QAC breakfast Tania Ade, MD   50 mcg at 08/01/14 [phone]  . LORazepam (ATIVAN) injection 0.5 mg  0.5 mg Intravenous Q4H PRN Samella Parr, NP   0.5 mg at 07/31/14 2307  . menthol-cetylpyridinium (CEPACOL) lozenge 3  mg  1 lozenge Oral PRN Grier Mitts, PA-C       Or  . phenol (CHLORASEPTIC) mouth spray 1 spray  1 spray Mouth/Throat PRN Grier Mitts, PA-C      . metoCLOPramide (REGLAN) tablet 5-10 mg  5-10 mg Oral Q8H PRN Grier Mitts, PA-C       Or  . metoCLOPramide (REGLAN) injection 5-10 mg  5-10 mg Intravenous Q8H PRN Grier Mitts, PA-C      . midazolam (VERSED) injection 1 mg  1 mg Intravenous Q15 min PRN Rush Farmer, MD      . midazolam (VERSED) injection 1 mg  1 mg Intravenous Q2H PRN Rush Farmer, MD   1 mg at 07/28/14 2343  . ondansetron (ZOFRAN) tablet 4 mg  4 mg Oral Q6H PRN Grier Mitts, PA-C       Or  . ondansetron (ZOFRAN) injection 4 mg  4 mg Intravenous Q6H PRN Grier Mitts, PA-C   4 mg at 08/01/14 0102  . pantoprazole (PROTONIX) EC tablet 40 mg  40 mg Oral Daily Tania Ade, MD   40 mg at 08/01/14 5784     Allergies  Allergen Reactions  . Morphine And Related Rash  . Codeine Rash  . Blue Dyes (Parenteral) Itching  . Red Dye Itching     Past Medical History  Diagnosis Date  . Arthritis   . Thyroid disease   . Cancer     breast cancer    Past Surgical History  Procedure Laterality Date  . Abdominal hysterectomy    . Tonsillectomy    . Shoulder surgery Bilateral   . Back surgery    . Bilateral carpal tunnel release    . Cholecystectomy    . Eye surgery Right   . Reverse shoulder arthroplasty Left 07/27/2014    Procedure: REVERSE SHOULDER ARTHROPLASTY;  Surgeon: Tania Ade, MD;  Location: [facility] [facility];  Service: Orthopedics;  Laterality: Left;  Left reverse total shoulder arthroplasty    History reviewed. No pertinent family history.  Social History:  reports that she has never smoked. She has never used smokeless tobacco. She reports that she does not drink alcohol or use illicit drugs.   Review of Systems: Constitutional:  denies fever, chills, diaphoresis, appetite change and fatigue.  HEENT: denies photophobia, eye pain,  redness, hearing loss, ear pain, congestion, sore throat, rhinorrhea, sneezing, neck pain, neck stiffness and tinnitus.  Respiratory: admits to SOB, DOE   Cardiovascular: denies chest pain, palpitations and leg swelling.  Gastrointestinal: denies nausea, vomiting, abdominal pain, diarrhea, constipation, blood in stool.  Genitourinary: denies dysuria, urgency, frequency, hematuria, flank pain and difficulty urinating.  Musculoskeletal: denies  myalgias, back pain, joint swelling, arthralgias and gait problem.   Skin: denies pallor, rash and wound.  Neurological: denies dizziness, seizures, syncope, weakness, light-headedness, numbness and headaches.   Hematological: denies adenopathy, easy bruising, personal or family bleeding history.  Psychiatric/ Behavioral: denies suicidal ideation, mood changes, confusion, nervousness, sleep disturbance and agitation.    Physical Exam: BP 138/61 mmHg  Pulse 97  Temp(Src) 97.4 F (36.3 C) (Axillary)  Resp 28  Ht 4\' 11"  (1.499 m)  Wt 47.9 kg (105 lb 9.6 oz)  SpO2 99%  Wt Readings from Last 3 Encounters:  07/30/14 47.9 kg (105 lb 9.6 oz)  07/19/14 45.178 kg (99 lb 9.6 oz)  06/20/14 45.859 kg (101 lb 1.6 oz)    General: Vital signs reviewed and noted. Well-developed, well-nourished, in no acute distress; alert,   Head: Normocephalic, atraumatic, sclera anicteric,   Neck: Supple. Negative for carotid bruits. No JVD   Lungs:  Clear bilaterally, no  wheezes, rales, or rhonchi. Breathing is normal   Heart: RRR with S1 S2. ? s3 gallop ( difficult to hear over the BIPAP and ambiant room noise    Abdomen/ GI :  Soft, non-tender, non-distended with normoactive bowel sounds. No hepatomegaly. No rebound/guarding. No obvious abdominal masses   MSK: Strength and the appear normal for age.   Extremities: No clubbing or cyanosis. No edema.  Distal pedal pulses are 2+ and equal   Neurologic:  CN are grossly intact,  No obvious motor or sensory defect.  Alert and  oriented X 3. Moves all extremities spontaneously.  Psych: Responds to questions appropriately with a normal affect.     Lab results: Basic Metabolic Panel:  Recent Labs Lab 07/29/14 0300 07/30/14 0358 07/31/14 0500 08/01/14 0043  NA 136 139 141 142  K 4.1 3.5 3.7 3.6  CL 109 114* 118* 111  CO2 18* 19* 19* 19*  GLUCOSE 83 109* 88 225*  BUN 19 19 13 14   CREATININE 1.25* 0.99 0.94 1.02*  CALCIUM 7.2* 6.8* 6.9* 8.1*  MG 1.6* 2.1 1.8 1.7  PHOS 3.6 1.9* 2.2* 3.6    Liver Function Tests:  Recent Labs Lab 07/28/14 1532  AST 70*  ALT 14  ALKPHOS 28*  BILITOT 0.9  PROT 5.2*  ALBUMIN 3.0*   No results for input(s): LIPASE, AMYLASE in the last 168 hours. No results for input(s): AMMONIA in the last 168 hours.  CBC:  Recent Labs Lab 07/28/14 1055  07/29/14 0300  07/29/14 1935 07/30/14 0358 07/31/14 0500 08/01/14 0043  WBC 17.4*  < > 10.8*  --  11.2* 10.0 8.9 14.6*  NEUTROABS 14.5*  --   --   --   --   --   --   --   HGB 7.6*  < > 8.8*  < > 8.6* 8.2* 8.1* 10.0*  HCT 22.4*  < > 25.5*  < > 24.6* 24.1* 24.0* 29.7*  MCV 89.2  < > 85.9  --  87.2 86.4 88.9 88.7  PLT 229  < > 139*  --  130* 130* 156 282  < > = values in this interval not displayed.  Cardiac Enzymes:  Recent Labs Lab 07/28/14 1532 08/01/14 0043 08/01/14 0852  TROPONINI 0.55* 0.43* 2.38*    BNP: Invalid input(s): POCBNP  CBG:  Recent Labs Lab 07/30/14 1938 07/31/14 0851 07/31/14 1157 07/31/14  1619 07/31/14 2144  GLUCAP 100* 94 78 77 79    Coagulation Studies: No results for input(s): LABPROT, INR in the last 72 hours.   Other results: Personal review of EKG shows :  NSR.  Poor R wave progression with TWI in the lateral leads ( the poor R wave progression and TWI are new from pre-op)   Imaging: Dg Chest Port 1 View  08/01/2014   CLINICAL DATA:  Respiratory failure.  Shortness of breath.  EXAM: PORTABLE CHEST - 1 VIEW  COMPARISON:  07/30/2014 at 0538 hour  FINDINGS: Endotracheal  tube, enteric tube, and left central line have been removed. Lower lung volumes from prior exam. Cardiomegaly is unchanged allowing for differences in technique. Development of mixed interstitial alveolar perihilar opacities, right greater than left. Progressive blunting of costophrenic angle suspicious for pleural effusions. No pneumothorax. Osseous structures are unchanged.  IMPRESSION: Development perihilar opacities concerning for pulmonary edema and probable pleural effusions. Findings consistent with CHF. Aspiration or less likely pneumonia could have a similar appearance.   Electronically Signed   By: Jeb Levering M.D.   On: 08/01/2014 00:25        Assessment & Plan:  1. NSTEMI:  Mrs. Keathley now has respiratory distress 5 days out from shoulder surgery. Her initial troponin levels were mildly elevated but this morning's troponin level is fairly significant - 2.38.  Her EKG shows poor R-wave progression into T-wave inversions in the lateral leads and I suspect that she has a tight occlusion of her mid/distal left anterior descending artery or perhaps a diagonal artery. We'll get an echocardiogram for further evaluation. I anticipate doing a cardiac catheter is a shunt in her later this afternoon. I discussed the case with the patient. The nurse will be calling in her family. I discussed the risks, benefits, and options of  heart catheterization. She understands and agrees to proceed.  I have discussed this with Dr. Tamera Punt  He has given the OK for [place] to go ahead with surgery .      Thayer Headings, Brooke Bonito., MD, [facility] 08/01/2014, 11:20 AM Office - [phone] Pager [phone]

## 2014-08-01 NOTE — Progress Notes (Signed)
ANTICOAGULATION CONSULT NOTE - Initial Consult  Pharmacy Consult for heparin Indication: chest pain/ACS  Allergies  Allergen Reactions  . Morphine And Related Rash  . Codeine Rash  . Blue Dyes (Parenteral) Itching  . Red Dye Itching    Patient Measurements: Height: 4\' 11"  (149.9 cm) Weight: 105 lb 9.6 oz (47.9 kg) IBW/kg (Calculated) : 43.2 Heparin Dosing Weight: 47.9  Vital Signs: Temp: 97.8 F (36.6 C) (07/26 1209) Temp Source: Axillary (07/26 1209) BP: 132/87 mmHg (07/26 1600) Pulse Rate: 92 (07/26 2005)  Labs:  Recent Labs  07/30/14 0358 07/31/14 0500  08/01/14 0043 08/01/14 0852 08/01/14 1556 08/01/14 2020  HGB 8.2* 8.1*  --  10.0*  --   --   --   HCT 24.1* 24.0*  --  29.7*  --   --   --   PLT 130* 156  --  282  --   --   --   HEPARINUNFRC  --   --   --   --   --   --  <0.10*  CREATININE 0.99 0.94  --  1.02*  --   --   --   TROPONINI  --   --   < > 0.43* 2.38* 2.06* 1.48*  < > = values in this interval not displayed.  Estimated Creatinine Clearance: 26.5 mL/min (by C-G formula based on Cr of 1.02).   Assessment: 81 yo F admitted 07/27/2014 L shoulder surgery who experienced increased confusion, increased agitation, tachycardic, tachypneic and hypertensive, elevated lactic acid, WBC 14.5, Tm 101.9.  Started Vancomycin and Zosyn for presumed sepsis. Developed SOB & chest tightness 7/25 evening, increased RR overnight, and Tp to 2.38 this AM.   First heparin level is <0.1  Goal of Therapy:  Heparin level 0.3-0.7 units/ml Monitor platelets by anticoagulation protocol: Yes   Plan:  Heparin 2000 units IV bolus x 1, then increase to 650 units/hr. Heparin level and CBC in 8 hours.  Candie Mile 08/01/2014,10:34 PM

## 2014-08-01 NOTE — Progress Notes (Signed)
CRITICAL VALUE ALERT  Critical value received:  Troponin 2.38   Date of notification:  08/01/2014  Time of notification:  0953  Critical value read back:Yes.    Nurse who received alert:  Rosebud Poles RN   MD notified (1st page):  Dr. Sherral Hammers    Time of first page:  [phone]  Responding MD: Dr. Sherral Hammers  Time MD responded:  [phone]

## 2014-08-01 NOTE — Care Management Note (Signed)
Case Management Note  Patient Details  Name: NERISSA CONSTANTIN MRN: [id number] Date of Birth: Mar 16, 1925  Subjective/Objective:    Adm w shoulder arthroplasty                Action/Plan: lives at home pta, pcp dr Derrill Memo   Expected Discharge Date:                  Expected Discharge Plan:  [place]  In-House Referral:  Clinical Social Work  Discharge planning Services     Post Acute Care Choice:    Choice offered to:     DME Arranged:    DME Agency:     HH Arranged:    [place] Agency:     Status of Service:     Medicare Important Message Given:  Yes-second notification given Date Medicare IM Given:    Medicare IM give by:    Date Additional Medicare IM Given:    Additional Medicare Important Message give by:     If discussed at [place] of Stay Meetings, dates discussed:    Additional Comments: received call from renee anjuilli w md office and she has been arranged [facility] pta from md office. Have made sw ref and alerted sw of plan for short term snf when dc.  Lacretia Leigh, RN 08/01/2014, 11:22 AM

## 2014-08-01 NOTE — Progress Notes (Signed)
Restarting pharmacy dosed heparin per Dr. Tamala Julian.

## 2014-08-01 NOTE — Progress Notes (Signed)
Pt very SOB, Sats 100% on 4L [place], RR in the 30s, using accessory muscles to breathe. Called Dr. Halford Chessman, orders to start BiPap. Will continue to monitor.

## 2014-08-01 NOTE — Progress Notes (Signed)
  Echocardiogram 2D Echocardiogram has been performed.  Andrea Gilmore 08/01/2014, 2:04 PM

## 2014-08-01 NOTE — Progress Notes (Signed)
[place] TEAM 1 - Stepdown/ICU TEAM Progress Note  DESMA WILKOWSKI DIY:[id number] DOB: 1925/03/02 DOA: 07/27/2014 PCP: Mayra Neer, MD  Admit HPI / Brief Narrative: 81 y/o F with PMHx of hypothyroidism, Dx Breast Ca high-grade ductal carcinoma in situ, ER and PR negative S/P lumpectomy+ XRT completed 2006, CKD stage III, iron deficiency anemia and osteroarthritis who was admitted 7/21 for planned left shoulder reverse arthroplasty.   Post-operatively 7/22 in the early am, the patient developed delirium, hallucinations, fever, and hypoxia. She was transferred to SDU and worked up for sepsis by [facility]. She was later noted to have left arm swelling and erythema. As well as a hgb drop from 10.8 to 7.6. CT of the arm was ordered per [facility] and is pending to further evaluate L arm swelling. The patient was typed and screened for 2 units PRBC's. PCCM consulted for evaluation 7/22 with decline in clinical status.   HPI/Subjective: 7/26 A/O x 4 states has been SOB since 0300, negative CP, negative diaphoresis, negative N/V   Assessment/Plan: Sepsis/fever/metabolic acidosis/aspiration PNA vs joint infection. -Patient with postoperative leukocytosis 14,500 -Empiric broad-spectrum antibiotics with vancomycin and Zosyn -Follow up on previously obtain blood cultures -Urinalysis and culture pending -Remains hemodynamically stable but continue to monitor;   Metabolic encephalopathy/Acute delirium -Resolved  Acute respiratory failure with hypoxia -Most likely multifactorial to include acute MI, with flash pulmonary edema, pneumonia?. -7/26 Echocardiogram; when compared to echocardiogram from 7/23 findings consistent for acute MI. Spoke with Dr Mertie Moores (Cardiology) and he plans to take patient for cardiac catheterization this evening.  -EKG; concerning for anterior lateral infarct. -Troponins trending up   Pulmonary Edema /Lt pleural effusion? -7/26 PCXR  c/w  Pulmonary Edema vs PNA ;  also possible Pleural effusion -Lasix 40 mg 1   ACS vs NSTEMI -Patient with acute onset SOB overnight, negative CP; troponins trending up significantly. Current troponin= 2.38 -Start patient on ACS heparin drip protocol -STAT EKG; when compared to EKG fm 07/19/2014 Flipped T-wave in Lead I, AVL, V4-V6, Poor R-wave progression in V1-V6. AnteroLat MI? - Consulted Cardiology  -Strict in and out -Daily weight  CKD, stage III (baseline Cr~1.1-1.4) -Currently at baseline  Known chronic left rotator cuff status post reversed shoulder arthroplasty -Management per orthopedic team -Marked edema and bruising left upper extremity below operative site so check CT left upper extremity to rule out hematoma, seroma or abscess  History of iron deficiency anemia  -Baseline hemoglobin 10.8 and currently hemoglobin 8.4 -Consider transfusion if hemoglobin drops below 8 or if symptomatic  Acute Blood Loss Anemia - suspected L arm hemorrhage post surgery, r/o other etiology -Tansfuse for Hb < 8  Protein calorie malnutrition.  Hypothyroidism  -TSH pending -Continue Synthroid 50 uq Daily  Acute Encephalopathy - in setting of delirium  - Resolved   Code Status: Partial  Family Communication: no family present at time of exam Disposition Plan: Per cardiology    Consultants: Dr Mertie Moores (Cardiology) Dr Tania Ade (Orthopedics) Dr Rush Farmer (PCCM)   Procedure/Significant Events: 7/21LEFT REVERSE SHOULDER ARTHROPLASTY   7/23 echocardiogram;- LVEF= 65%. -findings c/w Lt ventricular diastolic dysfunction. - Pulmonary arteries: PA peak pressure: 34 mm Hg (S). 7/26 echocardiogram;- LVEF=  20% to 25%. Akinesis mid-apicalanteroseptal myocardium. Akinesis of the mid-apicalinferior myocardium. - (grade 2diastolic dysfunction). - Mitral valve: moderate regurgitation.-Left atrium:  moderately dilated.-Tricuspid valve: moderate regurgitation. - Pulmonary arteries: PA peak pressure:  63 mm Hg (S).   Culture 7/22 blood x2 >>>NTD UC 7/22>>> positive pseudomonas pan sensitive Sputum  7/22>>>NTD   Antibiotics: Vancomycin 7/22>>>7/25 Zosyn 7/22>>>7/25 Cipro 7/25>>>   DVT prophylaxis: ACS Heparin    Devices    LINES / TUBES:  CVL L IJ TLC 7/22>>>     Continuous Infusions: . sodium chloride 5 mL/hr (08/01/14 1244)  . heparin 550 Units/hr (08/01/14 1816)    Objective: VITAL SIGNS: Temp: 97.8 F (36.6 C) (07/26 1209) Temp Source: Axillary (07/26 1209) BP: 132/87 mmHg (07/26 1600) Pulse Rate: 92 (07/26 2005) SPO2; FIO2:   Intake/Output Summary (Last 24 hours) at 08/01/14 2031 Last data filed at 08/01/14 1900  Gross per 24 hour  Intake 164.98 ml  Output   2150 ml  Net -1985.02 ml     Exam: General: A/O 4, positive acute respiratory distress on BiPAP Eyes: Negative headache, negative scleral hemorrhage ENT: Negative Runny nose, negative ear pain, negative tinnitus, negative gingival bleeding Neck:  Negative scars, masses, torticollis, lymphadenopathy, JVD Lungs: Clear to auscultation bilaterally without wheezes or crackles Cardiovascular: Regular rate and rhythm without murmur gallop or rub normal S1 and S2 Abdomen:negative abdominal pain, negative dysphagia, Nontender, nondistended, soft, bowel sounds positive, no rebound, no ascites, no appreciable mass Extremities: No significant cyanosis, clubbing, or edema bilateral lower extremities Psychiatric:  Negative depression, negative anxiety, negative fatigue, negative mania  Neurologic:  Cranial nerves II through XII intact, tongue/uvula midline, all extremities muscle strength 5/5, sensation intact throughout,negative dysarthria, negative expressive aphasia, negative receptive aphasia.      Data Reviewed: Basic Metabolic Panel:  Recent Labs Lab 07/28/14 1532 07/29/14 0300 07/30/14 0358 07/31/14 0500 08/01/14 0043  NA 138 136 139 141 142  K 4.9 4.1 3.5 3.7 3.6  CL 108 109 114*  118* 111  CO2 21* 18* 19* 19* 19*  GLUCOSE 154* 83 109* 88 225*  BUN 24* 19 19 13 14   CREATININE 1.40* 1.25* 0.99 0.94 1.02*  CALCIUM 8.0* 7.2* 6.8* 6.9* 8.1*  MG  --  1.6* 2.1 1.8 1.7  PHOS  --  3.6 1.9* 2.2* 3.6   Liver Function Tests:  Recent Labs Lab 07/28/14 1532  AST 70*  ALT 14  ALKPHOS 28*  BILITOT 0.9  PROT 5.2*  ALBUMIN 3.0*   No results for input(s): LIPASE, AMYLASE in the last 168 hours. No results for input(s): AMMONIA in the last 168 hours. CBC:  Recent Labs Lab 07/28/14 1055  07/29/14 0300 07/29/14 0840 07/29/14 1935 07/30/14 0358 07/31/14 0500 08/01/14 0043  WBC 17.4*  < > 10.8*  --  11.2* 10.0 8.9 14.6*  NEUTROABS 14.5*  --   --   --   --   --   --   --   HGB 7.6*  < > 8.8* 8.6* 8.6* 8.2* 8.1* 10.0*  HCT 22.4*  < > 25.5* 24.7* 24.6* 24.1* 24.0* 29.7*  MCV 89.2  < > 85.9  --  87.2 86.4 88.9 88.7  PLT 229  < > 139*  --  130* 130* 156 282  < > = values in this interval not displayed. Cardiac Enzymes:  Recent Labs Lab 07/28/14 1532 08/01/14 0043 08/01/14 0852 08/01/14 1556  TROPONINI 0.55* 0.43* 2.38* 2.06*   BNP (last 3 results) No results for input(s): BNP in the last 8760 hours.  ProBNP (last 3 results) No results for input(s): PROBNP in the last 8760 hours.  CBG:  Recent Labs Lab 07/30/14 1938 07/31/14 0851 07/31/14 1157 07/31/14 1619 07/31/14 2144  GLUCAP 100* 94 78 77 79    Recent Results (from the past 240 hour(s))  Culture, blood (routine x 2)     Status: None (Preliminary result)   Collection Time: 07/28/14  7:25 AM  Result Value Ref Range Status   Specimen Description BLOOD LEFT ARM  Final   Special Requests BOTTLES DRAWN AEROBIC AND ANAEROBIC 5CC  Final   Culture NO GROWTH 4 DAYS  Final   Report Status PENDING  Incomplete  Culture, blood (routine x 2)     Status: None (Preliminary result)   Collection Time: 07/28/14  7:35 AM  Result Value Ref Range Status   Specimen Description BLOOD RIGHT ANTECUBITAL  Final    Special Requests BOTTLES DRAWN AEROBIC AND ANAEROBIC 5CC  Final   Culture NO GROWTH 4 DAYS  Final   Report Status PENDING  Incomplete  Urine culture     Status: None   Collection Time: 07/28/14 10:44 AM  Result Value Ref Range Status   Specimen Description URINE, CATHETERIZED  Final   Special Requests NONE  Final   Culture 40,000 COLONIES/ml PSEUDOMONAS AERUGINOSA  Final   Report Status 07/31/2014 FINAL  Final   Organism ID, Bacteria PSEUDOMONAS AERUGINOSA  Final      Susceptibility   Pseudomonas aeruginosa - MIC*    CEFTAZIDIME <=1 SENSITIVE Sensitive     CIPROFLOXACIN 0.5 SENSITIVE Sensitive     GENTAMICIN <=1 SENSITIVE Sensitive     IMIPENEM <=0.25 SENSITIVE Sensitive     PIP/TAZO <=4 SENSITIVE Sensitive     CEFEPIME <=1 SENSITIVE Sensitive     * 40,000 COLONIES/ml PSEUDOMONAS AERUGINOSA  MRSA PCR Screening     Status: None   Collection Time: 07/28/14 12:02 PM  Result Value Ref Range Status   MRSA by PCR NEGATIVE NEGATIVE Final    Comment:        The GeneXpert MRSA Assay (FDA approved for NASAL specimens only), is one component of a comprehensive MRSA colonization surveillance program. It is not intended to diagnose MRSA infection nor to guide or monitor treatment for MRSA infections.   Urine culture     Status: None   Collection Time: 07/28/14  3:27 PM  Result Value Ref Range Status   Specimen Description URINE, CATHETERIZED  Final   Special Requests NONE  Final   Culture NO GROWTH 1 DAY  Final   Report Status 07/29/2014 FINAL  Final  Culture, blood (x 2)     Status: None (Preliminary result)   Collection Time: 07/28/14  3:30 PM  Result Value Ref Range Status   Specimen Description BLOOD RIGHT NECK  Final   Special Requests BOTTLES DRAWN AEROBIC ONLY 10CC RIGHT IJ  Final   Culture NO GROWTH 4 DAYS  Final   Report Status PENDING  Incomplete  Culture, blood (x 2)     Status: None (Preliminary result)   Collection Time: 07/28/14  3:40 PM  Result Value Ref Range  Status   Specimen Description BLOOD LEFT A-LINE  Final   Special Requests BOTTLES DRAWN AEROBIC ONLY 10C  Final   Culture NO GROWTH 4 DAYS  Final   Report Status PENDING  Incomplete  Culture, respiratory (NON-Expectorated)     Status: None   Collection Time: 07/28/14  4:35 PM  Result Value Ref Range Status   Specimen Description TRACHEAL ASPIRATE  Final   Special Requests Normal  Final   Gram Stain   Final    FEW WBC PRESENT,BOTH PMN AND MONONUCLEAR RARE SQUAMOUS EPITHELIAL CELLS PRESENT NO ORGANISMS SEEN Performed at [employer]   Final  NO GROWTH 2 DAYS Performed at [facility]    Report Status 07/31/2014 FINAL  Final     Studies:  Recent x-ray studies have been reviewed in detail by the Attending Physician  Scheduled Meds:  Scheduled Meds: . antiseptic oral rinse  7 mL Mouth Rinse BID  . ciprofloxacin  250 mg Oral Q breakfast  . furosemide  40 mg Intravenous Q8H  . levothyroxine  50 mcg Oral QAC breakfast  . pantoprazole  40 mg Oral Daily  . potassium chloride  40 mEq Oral TID  . sodium chloride  3 mL Intravenous Q12H    Time spent on care of this patient: 40 mins   WOODS, Geraldo Docker , MD  [facility] Office  [phone] Pager - [phone]  On-Call/Text Page:      [email or web address]      password [email or web address]  If 7PM-7AM, please contact night-coverage [email or web address] Password [email or web address] 08/01/2014, 8:31 PM   LOS: 5 days   Care during the described time interval was provided by me .  I have reviewed this patient's available data, including medical history, events of note, physical examination, and all test results as part of my evaluation. I have personally reviewed and interpreted all radiology studies.   Dia Crawford, MD [phone] Pager

## 2014-08-01 NOTE — Consult Note (Addendum)
  Interventional Cardiology   The patient is elderly, frail, and recovering from acute respiratory failure, delirium, lactic acidosis, fever, acute kidney injury, sepsis and acute blood loss anemia following left shoulder replacement on 07/27/2014. She was seen by critical care on 07/28/2014. In this setting an echocardiogram was performed on 07/29/2014 which demonstrated normal left ventricular systolic function. She required transfusion for acute anemia on 07/28/2014.  Early a.m. 08/01/2014, the patient developed acute worsening of dyspnea and required BiPAP. Chest x-ray revealed pulmonary edema. Troponin increased to 2.4. EKG's have not revealed acute acute or evolutionary changes of STEMI. She has decreased R wave forces throughout the precordium.  Seen in consultation by Dr. Acie Fredrickson on 08/01/2014 who referred the patient for coronary angiography, with concern for acute myocardial infarction after repeat echo revealed anteroapical and inferoapical akinesis with decrease in EF to less than 20%.  Upon arrival in the cath lab holding area, the patient is asymptomatic with reference to chest discomfort. She is on BiPAP. Her O2 saturations are in the high 90s. She is unable to lie flat because she feels more short of breath.  After reviewing all data, I made the decision that diagnostic coronary angiography would be more harmful than helpful at this time. The patient's respiratory failure needs to be treated with aggressive diuresis to avoid intubation. The patient and daughter express the patient's desire for limited advanced life support measures (only reversible acute illness and does not want CPR or electrical shock).   My current assessment is that the patient has had an ischemic insult to the anterior wall and apex. This could be related to coronary artery disease. She clinically is not having an acute infarction at this time. A more likely explanation for her clinical syndrome is stress cardiomyopathy  producing acute systolic heart failure. This could be reversible if other issues remain stable.   The patient will be returned to the 2-Heart Intensive Care Unit. She should be aggressively diuresed by the treating team. Serial cardiac markers should be obtained. We should check a BNP to confirm cardiac etiology for heart failure pattern on chest x-ray.  Overall prognosis is poor. Given the patient's age and other complicating clinical issues, cardiac catheterization and coronary intervention do not seem appropriate.  Should her clinical pattern change with the development of an acute infarct pattern with associated chest pain, emergency catheterization with PCI could again be considered.  Time spent was from 5 PM until 5:40 PM

## 2014-08-01 NOTE — Progress Notes (Addendum)
PULMONARY / CRITICAL CARE MEDICINE   Name: Andrea Gilmore MRN: [id number] DOB: [birth year]    ADMISSION DATE:  07/27/2014 CONSULTATION DATE:  07/28/14  REFERRING MD :  Dr. Tamera Punt   CHIEF COMPLAINT:  Acute Blood Loss Anemia post-op reverse shoulder arthroplasty   INITIAL PRESENTATION: 81 y/o F with PMH of hypothyroidism, prior breast cancer and osteroarthritis who was admitted 7/21 for planned left shoulder reverse arthroplasty.  Post-operatively 7/22, the patient developed delirium, hallucinations, fever, and hypoxia.  She was transferred to SDU and worked up for sepsis by [facility].  She was later noted to have left arm swelling and erythema.  PCCM consulted for evaluation 7/22.   STUDIES:  7/22 CXR >> left pleural effusion, hiatal hernia, gaseous distention of the stomach, L shoulder replacement  SIGNIFICANT EVENTS: 7/21  Admit for planned L should reverse arthroplasty  7/22  Early am developed delirium overnight, fever, hallucinations, hypoxia.  Arm swelling, hgb drop from 10.8 to 7.6 concerning for post-op bleeding   HISTORY OF PRESENT ILLNESS:  81 y/o F with PMH of hypothyroidism, prior breast cancer and osteroarthritis who was admitted 7/21 for planned left shoulder reverse arthroplasty.   Post-operatively 7/22 in the early am, the patient developed delirium, hallucinations, fever, and hypoxia.  She was transferred to SDU and worked up for sepsis by [facility].  She was later noted to have left arm swelling and erythema.  As well as a hgb drop from 10.8 to 7.6.  CT of the arm was ordered per [facility] and is pending to further evaluate L arm swelling.  The patient was typed and screened for 2 units PRBC's.  PCCM consulted for evaluation 7/22 with decline in clinical status.   SUBJECTIVE: Decompensated overnight, flash pulmonary edema and required BiPAP.  VITAL SIGNS: Temp:  [97.4 F (36.3 C)-98.5 F (36.9 C)] 97.8 F (36.6 C) (07/26 1209) Pulse Rate:  [57-111] 97 (07/26 1209) Resp:  [16-33] 24  (07/26 1209) BP: (117-179)/(43-141) 129/87 mmHg (07/26 1200) SpO2:  [97 %-100 %] 99 % (07/26 1209) FiO2 (%):  [40 %] 40 % (07/26 1209)   HEMODYNAMICS:     VENTILATOR SETTINGS: Vent Mode:  [-]  FiO2 (%):  [40 %] 40 %   INTAKE / OUTPUT:  Intake/Output Summary (Last 24 hours) at 08/01/14 1219 Last data filed at 08/01/14 0900  Gross per 24 hour  Intake    320 ml  Output   1250 ml  Net   -930 ml   PHYSICAL EXAMINATION: General:  Elderly appearing female, moderate respiratory distress this AM. Neuro:  Awake and interactive, following all commands. HEENT:  [place]/AT, PERRL, EOM-I and MMM. Cardiovascular:  RRR, Nl S1/S2, -M/R/G. Lungs:  Diffuse crackles. Abdomen:  Soft, NT, ND and +BS. Musculoskeletal:  -edema and -tenderness, erythema on the left shoulder and arm with significant enlargement. Skin:  As above, otherwise intact.  LABS:  CBC  Recent Labs Lab 07/30/14 0358 07/31/14 0500 08/01/14 0043  WBC 10.0 8.9 14.6*  HGB 8.2* 8.1* 10.0*  HCT 24.1* 24.0* 29.7*  PLT 130* 156 282   Coag's  Recent Labs Lab 07/28/14 1055 07/28/14 1532  APTT 28 30  INR 1.20 1.42   BMET  Recent Labs Lab 07/30/14 0358 07/31/14 0500 08/01/14 0043  NA 139 141 142  K 3.5 3.7 3.6  CL 114* 118* 111  CO2 19* 19* 19*  BUN 19 13 14   CREATININE 0.99 0.94 1.02*  GLUCOSE 109* 88 225*   Electrolytes  Recent Labs Lab 07/30/14 0358  07/31/14 0500 08/01/14 0043  CALCIUM 6.8* 6.9* 8.1*  MG 2.1 1.8 1.7  PHOS 1.9* 2.2* 3.6   Sepsis Markers  Recent Labs Lab 07/28/14 1055 07/28/14 1534 07/29/14 0300 07/30/14 1100 07/31/14 0500 08/01/14 0043  LATICACIDVEN 2.1* 0.8 0.6  --   --   --   PROCALCITON 0.95  --   --  0.95 0.59 0.32   ABG  Recent Labs Lab 07/28/14 1549 07/29/14 0318 07/30/14 0408  PHART 7.334* 7.336* 7.349*  PCO2ART 42.2 37.0 35.0  PO2ART 411.0* 143* 177*   Liver Enzymes  Recent Labs Lab 07/28/14 1532  AST 70*  ALT 14  ALKPHOS 28*  BILITOT 0.9  ALBUMIN  3.0*   Cardiac Enzymes  Recent Labs Lab 07/28/14 1532 08/01/14 0043 08/01/14 0852  TROPONINI 0.55* 0.43* 2.38*   Glucose  Recent Labs Lab 07/30/14 1149 07/30/14 1938 07/31/14 0851 07/31/14 1157 07/31/14 1619 07/31/14 2144  GLUCAP 80 100* 94 78 77 79    Imaging I reviewed CXR myself, no acute disease.  ASSESSMENT / PLAN:  PULMONARY OETT 7/22 >> 7/25 A: Acute Hypoxic Respiratory Failure / Respiratory Distress- new O2 requirement 7/22 L Pleural Effusion  Recurrent respiratory failure due to acute pulmonary edema. P:   - IS per RT protocol. - Family OK for short term intubation only. - Titrate O2 for sat. - Aspiration precautions. - Place back on BiPAP this AM, settings adjusted. - Additional diureses ordered. - Reinsert foley catheter.  CARDIOVASCULAR CVL L IJ TLC 7/22>>> A:  Tachycardia - in setting of respiratory distress  P:  - Continue close monitoring. - TRH calling cardiology. - EKG as ordered. - Additional diureses ordered. - DNR, no cardioversion or CPR, short term intubation only. - Follow CVP.  RENAL A:   Metabolic Acidosis - lactic acidosis +/- component AKI AKI  P:   - Trend BMP / UOP. - Replace electrolytes as indicated. - KVO IVF. - Lasix 40 mg IV q8 x2 doses. - Additional K-dur doses written for.  GASTROINTESTINAL A:   Protein calorie malnutrition. P:   - D/C OGT for extubation. - D/C TF. - Diet when able to stay off BiPAP.  HEMATOLOGIC A:   Acute Blood Loss Anemia - suspected L arm hemorrhage post surgery, r/o other etiology Hx Breast Cancer P:  - T&S. - Transfuse per ICU protocol.  INFECTIOUS A:   Fever, concern for aspiration PNA vs joint infection. P:   BCx2 7/22>>>NTD UC 7/22>>>pseudomonas pan sensitive Sputum 7/22>>>NTD  Vancomycin 7/22>>>7/25 Zosyn 7/22>>>7/25 Cipro 7/25>>> Sepsis protocol complete. Procalcitonin 0.59. D/C vancomycin and zosyn. Continue cipro.  ENDOCRINE A:   Hyperglycemia    Hypothyroidism  P:   - ISS - CBGs   NEUROLOGIC A:    Acute Encephalopathy - in setting of delirium  P:   RASS goal: 0 D/C versed/fentanyl. PRN percocet for pain, hold while in respiratory failure however.  Ortho: significant erythema and enlargement of the left arm, below surgical site.  Ortho evaluated, appreciate input.  FAMILY  - Updates: Patient updated at bedside.  The patient is critically ill with multiple organ systems failure and requires high complexity decision making for assessment and support, frequent evaluation and titration of therapies, application of advanced monitoring technologies and extensive interpretation of multiple databases.   Critical Care Time devoted to patient care services described in this note is  35  Minutes. This time reflects time of care of this signee Dr [name]. This critical care time does not reflect procedure time,  or teaching time or supervisory time of PA/NP/Med student/Med Resident etc but could involve care discussion time.  Rush Farmer, M.D. [facility] Pulmonary/Critical Care Medicine. Pager: [phone]. After hours pager: [phone].

## 2014-08-01 NOTE — Progress Notes (Signed)
   PATIENT ID: Andrea Gilmore   5 Days Post-Op Procedure(s) (LRB): REVERSE SHOULDER ARTHROPLASTY (Left)  Subjective: Reports doing well this am. Left shoulder pain is minimal. Has some low back pain. Tells me she has had LBP prior to surgery/hospital event and has hx 2 lumbar spine surgeries. Feels like she has aggravated this over the course of her stay. Denies radicular pain.   Objective:  Filed Vitals:   08/01/14 1000  BP: 138/61  Pulse: 97  Temp:   Resp: 27    Alert and oriented. Left shoulder dressing with scant dry blood. Distally left hand neurovascularly int  Labs:   Recent Labs  07/29/14 1935 07/30/14 0358 07/31/14 0500 08/01/14 0043  HGB 8.6* 8.2* 8.1* 10.0*   Recent Labs  07/31/14 0500 08/01/14 0043  WBC 8.9 14.6*  RBC 2.70* 3.35*  HCT 24.0* 29.7*  PLT 156 282   Recent Labs  07/31/14 0500 08/01/14 0043  NA 141 142  K 3.7 3.6  CL 118* 111  CO2 19* 19*  BUN 13 14  CREATININE 0.94 1.02*  GLUCOSE 88 225*  CALCIUM 6.9* 8.1*    Assessment and Plan: Postoperative day 5 status post left reverse total shoulder replacement complicated by postoperative decline Full orientated and alert Critical Care signed off and their help is greatly appreciated. Stepdown currently, possibility to move to floor soon LBP without injury, likely exacerbated during spasm events or secondary to lying in bed over the last few days will continue to monitor and see how she does with PT Will get up with PT today dispo TBD- CIR vs SNF  VTE proph: ASA 325mg  BID SCDs

## 2014-08-01 NOTE — Progress Notes (Signed)
RT placed patient on Bipap 14/8. Patient is sat 98 on 40%. Patient is tolerating well. RT will continue to monitor as needed.

## 2014-08-02 DIAGNOSIS — N183 Chronic kidney disease, stage 3 unspecified: Secondary | ICD-10-CM | POA: Diagnosis present

## 2014-08-02 DIAGNOSIS — R0902 Hypoxemia: Secondary | ICD-10-CM | POA: Diagnosis present

## 2014-08-02 DIAGNOSIS — J810 Acute pulmonary edema: Secondary | ICD-10-CM

## 2014-08-02 DIAGNOSIS — N390 Urinary tract infection, site not specified: Secondary | ICD-10-CM | POA: Diagnosis present

## 2014-08-02 DIAGNOSIS — D62 Acute posthemorrhagic anemia: Secondary | ICD-10-CM

## 2014-08-02 DIAGNOSIS — J690 Pneumonitis due to inhalation of food and vomit: Secondary | ICD-10-CM

## 2014-08-02 DIAGNOSIS — IMO0001 Reserved for inherently not codable concepts without codable children: Secondary | ICD-10-CM | POA: Diagnosis present

## 2014-08-02 DIAGNOSIS — D509 Iron deficiency anemia, unspecified: Secondary | ICD-10-CM

## 2014-08-02 DIAGNOSIS — G9340 Encephalopathy, unspecified: Secondary | ICD-10-CM | POA: Diagnosis present

## 2014-08-02 DIAGNOSIS — R7989 Other specified abnormal findings of blood chemistry: Secondary | ICD-10-CM

## 2014-08-02 LAB — CBC
HCT: 29.8 % — ABNORMAL LOW (ref 36.0–46.0)
Hemoglobin: 10 g/dL — ABNORMAL LOW (ref 12.0–15.0)
MCH: 30.4 pg (ref 26.0–34.0)
MCHC: 33.6 g/dL (ref 30.0–36.0)
MCV: 90.6 fL (ref 78.0–100.0)
Platelets: 274 10*3/uL (ref 150–400)
RBC: 3.29 MIL/uL — ABNORMAL LOW (ref 3.87–5.11)
RDW: 15.7 % — ABNORMAL HIGH (ref 11.5–15.5)
WBC: 22 10*3/uL — AB (ref 4.0–10.5)

## 2014-08-02 LAB — CULTURE, BLOOD (ROUTINE X 2)
CULTURE: NO GROWTH
CULTURE: NO GROWTH
Culture: NO GROWTH
Culture: NO GROWTH

## 2014-08-02 LAB — BASIC METABOLIC PANEL
Anion gap: 13 (ref 5–15)
BUN: 23 mg/dL — AB (ref 6–20)
CO2: 16 mmol/L — AB (ref 22–32)
Calcium: 8.6 mg/dL — ABNORMAL LOW (ref 8.9–10.3)
Chloride: 111 mmol/L (ref 101–111)
Creatinine, Ser: 1.31 mg/dL — ABNORMAL HIGH (ref 0.44–1.00)
GFR calc Af Amer: 41 mL/min — ABNORMAL LOW (ref >60)
GFR calc non Af Amer: 35 mL/min — ABNORMAL LOW (ref >60)
Glucose, Bld: 121 mg/dL — ABNORMAL HIGH (ref 65–99)
Potassium: 5.4 mmol/L — ABNORMAL HIGH (ref 3.5–5.1)
SODIUM: 140 mmol/L (ref 135–145)

## 2014-08-02 LAB — HEPARIN LEVEL (UNFRACTIONATED)
HEPARIN UNFRACTIONATED: 0.34 [IU]/mL (ref 0.30–0.70)
Heparin Unfractionated: 0.27 IU/mL — ABNORMAL LOW (ref 0.30–0.70)

## 2014-08-02 LAB — MAGNESIUM: MAGNESIUM: 1.6 mg/dL — AB (ref 1.7–2.4)

## 2014-08-02 MED ORDER — MAGNESIUM SULFATE 2 GM/50ML IV SOLN
2.0000 g | Freq: Once | INTRAVENOUS | Status: AC
  Administered 2014-08-02: 2 g via INTRAVENOUS
  Filled 2014-08-02: qty 50

## 2014-08-02 MED ORDER — HEPARIN (PORCINE) IN NACL 100-0.45 UNIT/ML-% IJ SOLN
750.0000 [IU]/h | INTRAMUSCULAR | Status: DC
Start: 2014-08-02 — End: 2014-08-03
  Filled 2014-08-02 (×2): qty 250

## 2014-08-02 MED ORDER — FUROSEMIDE 10 MG/ML IJ SOLN
20.0000 mg | Freq: Three times a day (TID) | INTRAMUSCULAR | Status: AC
  Administered 2014-08-02 (×2): 20 mg via INTRAVENOUS
  Filled 2014-08-02 (×2): qty 2

## 2014-08-02 NOTE — Progress Notes (Signed)
Occupational Therapy Treatment Patient Details Name: Andrea Gilmore MRN: [id number] DOB: [birth year] Today's Date: 08/02/2014    History of present illness Pt admitted for L reverse TSA on 7/21. Transferred to ICU 7/22 due to delirium, hallucinations, fever, hypoxia and anemia and intubated. Extubated 7/24   OT comments  Pt progressing well in mobility and activity tolerance.  02 sats remained in 90s on 4L with HR 80-93.  No complaints of pain.  Pt needs multiple verbal reminders to avoid using her L UE.  Educated in positioning in bed and chair and performed L elbow exercises.  Pt's plan is for ST rehab at [facility].  Will continue to follow.  Follow Up Recommendations  SNF;Supervision/Assistance - 24 hour    Equipment Recommendations       Recommendations for Other Services      Precautions / Restrictions Precautions Precautions: Fall;Shoulder Type of Shoulder Precautions: conservative Shoulder Interventions: Shoulder sling/immobilizer;Off for dressing/bathing/exercises Precaution Comments: pt needs repeated reminders to not use L UE Required Braces or Orthoses: Sling Restrictions Weight Bearing Restrictions: Yes LUE Weight Bearing: Non weight bearing       Mobility Bed Mobility Overal bed mobility: Needs Assistance Bed Mobility: Rolling;Sidelying to Sit Rolling: Min guard Sidelying to sit: Min assist       General bed mobility comments: instructed in log roll technique toward R side to minimize use of L UE and protect her back  Transfers Overall transfer level: Needs assistance   Transfers: Sit to/from Stand;Stand Pivot Transfers Sit to Stand: +2 physical assistance;Mod assist;Min assist Stand pivot transfers: +2 physical assistance;Mod assist       General transfer comment: With B AFOs donned.    Balance   Sitting-balance support: Feet supported Sitting balance-Leahy Scale: Fair       Standing balance-Leahy Scale: Poor                     ADL  Overall ADL's : Needs assistance/impaired     Grooming: Moderate assistance;Brushing hair;Oral care;Wash/dry face;Sitting           Upper Body Dressing : Maximal assistance Upper Body Dressing Details (indicate cue type and reason): for sling, educated in proper positioning Lower Body Dressing: Maximal assistance;With adaptive equipment;Sit to/from stand Lower Body Dressing Details (indicate cue type and reason): assist to donn shoes, socks and AFOs, issued a long handled shoe horn Toilet Transfer: +2 for physical assistance;Moderate assistance;Stand-pivot   Toileting- Clothing Manipulation and Hygiene: Total assistance;+2 for physical assistance;Sit to/from stand         General ADL Comments: Educated pt in positioning L UE in bed and chair.  Upon arrival, pt with L UE abducted and elevated on a pillow.      Vision                     Perception     Praxis      Cognition   Behavior During Therapy: WFL for tasks assessed/performed Overall Cognitive Status: Impaired/Different from baseline Area of Impairment: Memory     Memory: Decreased short-term memory               Extremity/Trunk Assessment               Exercises Shoulder Exercises Elbow Flexion: Left;10 reps;Seated;AROM Elbow Extension: Left;10 reps;Seated;AROM   Shoulder Instructions       General Comments      Pertinent Vitals/ Pain       Pain Assessment: No/denies pain  Home Living                                          Prior Functioning/Environment              Frequency Min 3X/week     Progress Toward Goals  OT Goals(current goals can now be found in the care plan section)  Progress towards OT goals: Progressing toward goals  Acute Rehab OT Goals Patient Stated Goal: be able to return to independent function Time For Goal Achievement: 08/12/14 Potential to Achieve Goals: Good  Plan Discharge plan needs to be updated    Co-evaluation     PT/OT/SLP Co-Evaluation/Treatment: Yes Reason for Co-Treatment: For patient/therapist safety   OT goals addressed during session: ADL's and self-care;Strengthening/ROM      End of Session Equipment Utilized During Treatment: Gait belt   Activity Tolerance Patient tolerated treatment well   Patient Left     Nurse Communication          Time: 7544-9201 OT Time Calculation (min): 55 min  Charges: OT General Charges $OT Visit: 1 Procedure OT Treatments $Self Care/Home Management : 8-22 mins $Therapeutic Exercise: 8-22 mins  Malka So 08/02/2014, 11:20 AM  [phone]

## 2014-08-02 NOTE — Progress Notes (Signed)
SUBJECTIVE:  No further CP.  OBJECTIVE:   Vitals:   Filed Vitals:   08/02/14 0600 08/02/14 0700 08/02/14 0731 08/02/14 0759  BP: 95/45  97/51 97/51  Pulse: 66 73 76 74  Temp:   97.5 F (36.4 C)   TempSrc:   Oral   Resp: 16 20 26 26   Height:      Weight:      SpO2: 100% 100% 100% 100%   I&O's:   Intake/Output Summary (Last 24 hours) at 08/02/14 4235 Last data filed at 08/02/14 0700  Gross per 24 hour  Intake 185.21 ml  Output   1850 ml  Net -1664.79 ml   TELEMETRY: Reviewed telemetry pt in NSR:     PHYSICAL EXAM General: Well developed, well nourished, in no acute distress Head:   Normal cephalic and atramatic  Lungs:   Coarse breath sounds bilaterally to auscultation. Heart:   HRRR S1 S2  No JVD.   Abdomen: abdomen soft and non-tender Msk:  Back normal,  Normal strength and tone for age. Extremities:   No edema.   Neuro: Alert and oriented. Psych:  Normal affect, responds appropriately Skin: No rash   LABS: Basic Metabolic Panel:  Recent Labs  07/31/14 0500 08/01/14 0043 08/02/14 0235  NA 141 142 140  K 3.7 3.6 5.4*  CL 118* 111 111  CO2 19* 19* 16*  GLUCOSE 88 225* 121*  BUN 13 14 23*  CREATININE 0.94 1.02* 1.31*  CALCIUM 6.9* 8.1* 8.6*  MG 1.8 1.7 1.6*  PHOS 2.2* 3.6  --    Liver Function Tests: No results for input(s): AST, ALT, ALKPHOS, BILITOT, PROT, ALBUMIN in the last 72 hours. No results for input(s): LIPASE, AMYLASE in the last 72 hours. CBC:  Recent Labs  08/01/14 0043 08/02/14 0235  WBC 14.6* 22.0*  HGB 10.0* 10.0*  HCT 29.7* 29.8*  MCV 88.7 90.6  PLT 282 274   Cardiac Enzymes:  Recent Labs  08/01/14 0852 08/01/14 1556 08/01/14 2020  TROPONINI 2.38* 2.06* 1.48*   BNP: Invalid input(s): POCBNP D-Dimer: No results for input(s): DDIMER in the last 72 hours. Hemoglobin A1C: No results for input(s): HGBA1C in the last 72 hours. Fasting Lipid Panel: No results for input(s): CHOL, HDL, LDLCALC, TRIG, CHOLHDL,  LDLDIRECT in the last 72 hours. Thyroid Function Tests:  Recent Labs  08/01/14 1240  TSH 3.119   Anemia Panel: No results for input(s): VITAMINB12, FOLATE, FERRITIN, TIBC, IRON, RETICCTPCT in the last 72 hours. Coag Panel:   Lab Results  Component Value Date   INR 1.42 07/28/2014   INR 1.20 07/28/2014   INR 1.02 07/19/2014    RADIOLOGY: Dg Chest 2 View  07/19/2014   CLINICAL DATA:  Preoperative exam prior left shoulder surgery, history of breast malignancy  EXAM: CHEST  2 VIEW  COMPARISON:  PA and lateral chest x-ray of October 07, 2012  FINDINGS: The lungs are hyperinflated with hemidiaphragm flattening. There is no focal infiltrate. There is no pleural effusion. The heart and pulmonary vascularity are normal. There is calcification in the wall of the aortic arch. There is mild tortuosity of the descending thoracic aorta. There is a moderate-sized hiatal hernia. There is degenerative disc disease at multiple thoracic levels. There are degenerative changes of both shoulders.  IMPRESSION: COPD.  There is no active cardiopulmonary disease.   Electronically Signed   By: David  Martinique M.D.   On: 07/19/2014 15:31   Ct Head Wo Contrast  07/28/2014   CLINICAL DATA:  81 year old who underwent left shoulder arthroplasty yesterday, now with acute delirium.  EXAM: CT HEAD WITHOUT CONTRAST  TECHNIQUE: Contiguous axial images were obtained from the base of the skull through the vertex without intravenous contrast.  COMPARISON:  CT head 05/01/2014, 10/21/2010.  MRI brain 10/21/2010.  FINDINGS: Ventricular system normal in size and appearance for age. Mild to moderate cortical and deep atrophy and moderate cerebellar atrophy, progressive since 2012. Mild changes of small vessel disease of the white matter diffusely, also progressive. Physiologic calcifications in the basal ganglia, unchanged. No mass lesion. No midline shift. No acute hemorrhage or hematoma. No extra-axial fluid collections. No evidence of  acute infarction.  Mild changes of hyperostosis frontalis interna. Visualized paranasal sinuses, bilateral mastoid air cells and bilateral middle ear cavities well-aerated. Bilateral carotid siphon and vertebral artery atherosclerosis.  IMPRESSION: 1. No acute intracranial abnormality. 2. Mild to moderate generalized atrophy and mild chronic microvascular ischemic changes of the white matter.   Electronically Signed   By: Evangeline Dakin M.D.   On: 07/28/2014 22:37   Ct Angio Chest Pe W/cm &/or Wo Cm  07/28/2014   CLINICAL DATA:  Acute onset of hypoxia. Recent left shoulder surgery. Evaluate for pulmonary embolus. Initial encounter.  EXAM: CT ANGIOGRAPHY CHEST WITH CONTRAST  TECHNIQUE: Multidetector CT imaging of the chest was performed using the standard protocol during bolus administration of intravenous contrast. Multiplanar CT image reconstructions and MIPs were obtained to evaluate the vascular anatomy.  CONTRAST:  52mL OMNIPAQUE IOHEXOL 350 MG/ML SOLN  COMPARISON:  Chest radiograph performed earlier today at 3:08 p.m.  FINDINGS: There is no evidence of pulmonary embolus.  Trace bilateral pleural fluid is noted, with minimal associated atelectasis. There is no evidence of pneumothorax. No masses are identified; no abnormal focal contrast enhancement is seen.  A moderate hiatal hernia is noted, filled with fluid. The patient's endotracheal tube is seen ending 1-2 cm above the carina. Scattered coronary artery calcifications are seen. The mediastinum is otherwise unremarkable. No mediastinal lymphadenopathy is seen. No pericardial effusion is identified. The great vessels are grossly unremarkable in appearance. Scattered calcification is noted along the aortic arch and descending thoracic aorta.  Postoperative change is noted about the left shoulder, with scattered soft tissue air and diffuse soft tissue inflammation tracking along the proximal left arm. No axillary lymphadenopathy is seen. The visualized  portions of the thyroid gland are unremarkable in appearance.  The visualized portions of the liver and spleen are unremarkable. The patient is status post cholecystectomy, with clips noted along the gallbladder fossa. The visualized portions of the pancreas, adrenal glands and kidneys are grossly unremarkable. Scattered vascular calcifications are seen at the renal hila bilaterally.  No acute osseous abnormalities are seen. A left shoulder arthroplasty is grossly unremarkable in appearance, though incompletely imaged. Endplate sclerotic change is noted at T11-T12.  Review of the MIP images confirms the above findings.  IMPRESSION: 1. No evidence of pulmonary nodules. 2. Endotracheal tube seen ending 1-2 cm above the carina. This could be retracted 1-2 cm. 3. Trace bilateral pleural fluid, with minimal associated atelectasis. 4. Moderate hiatal hernia, filled with fluid. 5. Postoperative change about the left shoulder, with scattered soft tissue air and diffuse soft tissue inflammation tracking along the proximal left arm. The visualized portions of the patient's left shoulder arthroplasty appear grossly intact, without evidence of loosening. 6. Scattered calcification along the aortic arch and distal thoracic aorta, and scattered vascular calcifications at the renal hila bilaterally.   Electronically Signed  By: Garald Balding M.D.   On: 07/28/2014 22:39   Ct Shoulder Left Wo Contrast  07/14/2014   CLINICAL DATA:  Chronic left shoulder pain for years. History of surgery in the 1980s and left breast cancer. pre-op for left shoulder replacement. Initial encounter.  EXAM: CT OF THE LEFT SHOULDER WITHOUT CONTRAST  TECHNIQUE: Multidetector CT imaging was performed according to the standard protocol. Multiplanar CT image reconstructions were also generated.  COMPARISON:  None.  FINDINGS: There are severe glenohumeral degenerative changes on the left with marked joint space loss, osteophyte and subchondral cyst  formation in the humeral head and glenoid. There is a large amount of complex fluid within the shoulder joint, superior subscapularis recess and coracoid bursa. There are multiple intra-articular loose bodies, the largest along the superior glenoid rim, measuring up to 18 mm in diameter.  The subacromial space is obliterated. There is erosion of the undersurface of the acromion. The acromioclavicular joint is widened consistent with previous distal clavicle resection. There is a large chronic rotator cuff tear with marked atrophy of the supraspinatus and infraspinous muscles.  Emphysematous changes are present within the left lung. No chest wall lesion identified. There is atherosclerosis of the aorta and coronary arteries.  IMPRESSION: 1. Advanced glenohumeral degenerative changes as described with multiple intra-articular loose bodies. 2. Obliterated subacromial space with large chronic rotator cuff tear involving the supraspinatus and infraspinous tendons.   Electronically Signed   By: Richardean Sale M.D.   On: 07/14/2014 15:44   Dg Chest Port 1 View  08/01/2014   CLINICAL DATA:  Respiratory failure.  Shortness of breath.  EXAM: PORTABLE CHEST - 1 VIEW  COMPARISON:  07/30/2014 at 0538 hour  FINDINGS: Endotracheal tube, enteric tube, and left central line have been removed. Lower lung volumes from prior exam. Cardiomegaly is unchanged allowing for differences in technique. Development of mixed interstitial alveolar perihilar opacities, right greater than left. Progressive blunting of costophrenic angle suspicious for pleural effusions. No pneumothorax. Osseous structures are unchanged.  IMPRESSION: Development perihilar opacities concerning for pulmonary edema and probable pleural effusions. Findings consistent with CHF. Aspiration or less likely pneumonia could have a similar appearance.   Electronically Signed   By: Jeb Levering M.D.   On: 08/01/2014 00:25   Dg Chest Port 1 View  07/30/2014   CLINICAL  DATA:  Ventilator dependent respiratory failure. Three days postop left shoulder arthroplasty.  EXAM: PORTABLE CHEST - 1 VIEW  COMPARISON:  07/29/2014 and earlier, including CTA chest 07/28/2014.  FINDINGS: Endotracheal tube tip in satisfactory position approximately 4 cm above the carina. Left jugular central venous catheter tip projects over the upper SVC, unchanged. Nasogastric tube courses below the diaphragm into the stomach. Cardiac silhouette mildly enlarged but stable. Mild atelectasis at the left lung base and small left pleural effusion, slightly improved since yesterday. Lungs otherwise clear. Left shoulder arthroplasty with anatomic alignment. Severe degenerative changes in right shoulder.  IMPRESSION: 1. Support apparatus satisfactory. 2. Mild atelectasis at the left lung base and small left pleural effusion, improved since yesterday. 3. No new abnormalities.   Electronically Signed   By: Evangeline Dakin M.D.   On: 07/30/2014 09:22   Dg Chest Port 1 View  07/29/2014   CLINICAL DATA:  Check endotracheal tube placement  EXAM: PORTABLE CHEST - 1 VIEW  COMPARISON:  07/28/2014  FINDINGS: Cardiac shadow is stable. A left central venous line is again noted and stable. Endotracheal tube is been withdrawn and now lies approximately 5.5 cm above  the carina. The lungs are well aerated bilaterally with mild interstitial changes. A small left pleural effusion is again seen. Postsurgical changes in left shoulder are seen.  IMPRESSION: Interval withdrawal of the endotracheal tube. No new focal abnormality is noted.   Electronically Signed   By: Inez Catalina M.D.   On: 07/29/2014 08:14   Dg Chest Port 1 View  07/28/2014   CLINICAL DATA:  Central line placement.  EXAM: PORTABLE CHEST - 1 VIEW  COMPARISON:  Chest x-ray dated earlier same day.  FINDINGS: There has been interval placement of a left-sided central line with tip in the upper superior vena cava. Endotracheal tube has also been placed with tip slightly  into the right mainstem bronchus. Cardiomediastinal silhouette is stable in size and configuration.  Suspect trace left pleural effusion. Lungs otherwise clear. No pneumothorax seen. The left shoulder replacement hardware is stable in position. No acute osseous abnormality seen.  A hiatal hernia is better appreciated on the earlier chest x-ray. Again noted is gaseous distention of the stomach.  IMPRESSION: 1. Endotracheal tube tip projects slightly into the right mainstem bronchus. Recommend retracting approximately 2 cm for optimal radiographic positioning. 2. Left-sided internal jugular central line in place with tip at the upper margin of the SVC. 3. No pneumothorax. 4. Suspect trace left pleural effusion versus mild left basilar atelectasis. Lungs otherwise clear.  These results will be called to the ordering clinician or representative by the Radiologist Assistant, and communication documented in the PACS or zVision Dashboard.   Electronically Signed   By: Franki Cabot M.D.   On: 07/28/2014 15:25   Dg Chest Port 1 View  07/28/2014   CLINICAL DATA:  Tachycardia.  Postop left shoulder replacement.  EXAM: PORTABLE CHEST - 1 VIEW  COMPARISON:  07/19/2014  FINDINGS: The patient has undergone left shoulder replacement. The heart size is accentuated by technique. Probable left-sided pleural effusion noted. Large hiatal hernia noted. Right lung is clear. No pulmonary edema. Surgical clips overlie the left axillary region. There is gaseous distension of the stomach.  IMPRESSION: 1. Suspect left pleural effusion. 2. Hiatal hernia.  Gaseous distension of the stomach.   Electronically Signed   By: Nolon Nations M.D.   On: 07/28/2014 09:02   Dg Shoulder Left Port  07/28/2014   CLINICAL DATA:  Pain  EXAM: LEFT SHOULDER - 1 VIEW  COMPARISON:  July 27, 2014  FINDINGS: Frontal view obtained. There is a total shoulder replacement prosthesis on the left with alignment anatomic on this single view. No fracture or  dislocation apparent. There is evidence of old trauma involving the lateral left clavicle with widening between the clavicle and acromion, stable. No coracoclavicular separation seen on this study.  IMPRESSION: No change in total shoulder prosthesis alignment on frontal view. No acute fracture or dislocation. Stable widening between the lateral left clavicle and acromion with evidence of old trauma involving the lateral left clavicle.   Electronically Signed   By: Lowella Grip III M.D.   On: 07/28/2014 15:19   Dg Shoulder Left Port  07/27/2014   CLINICAL DATA:  Postop  EXAM: LEFT SHOULDER - 1 VIEW  COMPARISON:  07/14/2014  FINDINGS: Left total shoulder arthroplasty is anatomically aligned. There is no breakage or loosening of the hardware. Gas in the soft tissues is consistent with recent surgery. Loose body above the acetabular component is stable.  IMPRESSION: Left total shoulder arthroplasty anatomically aligned.   Electronically Signed   By: Rodena Goldmann.D.  On: 07/27/2014 10:04   Dg Abd Portable 1v  07/29/2014   CLINICAL DATA:  81 year old female status post enteric tube placement. Check position.  EXAM: PORTABLE ABDOMEN - 1 VIEW  COMPARISON:  Earlier Radiograph dated 07/28/2014  FINDINGS: There has been interval placement of an enteric tube with tip in the right hemi abdomen likely in the distal stomach. There is no evidence of bowel obstruction. No free air. Moderate stool noted throughout the colon. Excreted contrast noted within the renal collecting systems bilaterally. Cholecystectomy clips. L4-L5 disc spacer and posterior fixation hardware. Degenerative changes of the spine.  IMPRESSION: Enteric tube with tip in the right abdomen likely in the distal stomach. No evidence of bowel obstruction.   Electronically Signed   By: Anner Crete M.D.   On: 07/29/2014 01:11   Dg Abd Portable 1v  07/28/2014   CLINICAL DATA:  Nasogastric tube placement  EXAM: PORTABLE ABDOMEN - 1 VIEW  COMPARISON:   Portable exam 1652 hours compared to 08/31/2012 lumbar spine radiographs  FINDINGS: Tip of endotracheal tube projects 2.7 cm above carina.  Tip of nasogastric tube is in the mid thorax ; recommend advancing tube 18 cm to place proximal side-port within the proximal stomach.  LEFT jugular central venous catheter tip projecting over SVC.  Normal heart size with calcification and tortuosity of thoracic aorta.  Lungs appear emphysematous without gross infiltrate or pleural effusion.  Minimal atelectasis LEFT base.  Prominent bowel gas and stool in colon in the upper abdomen.  Gaseous distention of stomach.  Prior lumbosacral fusion.  Bones demineralized.  IMPRESSION: Tip of nasogastric tube is in the mid thoracic esophagus; recommend advancing tube 18 cm to place proximal side-port within the proximal stomach.  Findings called to [place] on [place] on 07/28/2014 at 1706 hours.   Electronically Signed   By: Lavonia Dana M.D.   On: 07/28/2014 17:07   Ct Extrem Up Entire Arm L Wo/cm  07/28/2014   CLINICAL DATA:  81 year old female with left shoulder reverse arthroplasty. Evaluate for hematoma  EXAM: CT OF THE UPPER LEFT ARM WITHOUT CONTRAST  TECHNIQUE: Multidetector CT imaging was performed according to the standard protocol. Multiplanar CT image reconstructions were also generated.  COMPARISON:  Radiograph dated 07/28/2014 and CT dated 07/14/2014  FINDINGS: Evaluation very limited due to streak artifact caused by metallic arthroplasty.  There is a total left shoulder reverse arthroplasty. There is no evidence of dislocation. There is extensive osteoarthritic changes of the proximal humerus. No acute fracture. Multiple small bone fragments superior to the arthroplasty are similar to prior study and may represent ectopic bone formation or loose bodies. There is diffuse soft tissue edema with small fluid surrounding the joint. No evidence of large hematoma. There is postsurgical changes of the soft tissues in the anterior  upper extremity with small pockets of soft tissue air. There is diffuse subcutaneous soft tissue stranding.  A small left pleural effusion may be present.  IMPRESSION: Postsurgical changes of left shoulder reverse arthroplasty. No significant fluid collection or hematoma identified.   Electronically Signed   By: Anner Crete M.D.   On: 07/28/2014 22:46      ASSESSMENT: Kathyrn Lass:  Cardiomyopathy: possibly stress induced.  Patient without any CP at this time.  Now 100% on [place] O2.  Much improved from yesterday.  Mild cr bump. Would not cath.  Continue medical therapy for fluid overload as she tolerates. Check Cr in AM and redose Lasix at that time if needed.  Troponin trending down.  OK to walk with PT.  Discussed findings with the family.  Continue heparin IV for 48 hours total and then d/c.  Jettie Booze, MD  08/02/2014  9:28 AM

## 2014-08-02 NOTE — Progress Notes (Addendum)
ANTICOAGULATION CONSULT NOTE - Follow Up Consult  Pharmacy Consult for heparin Indication: chest pain/ACS  Allergies  Allergen Reactions  . Morphine And Related Rash  . Codeine Rash  . Blue Dyes (Parenteral) Itching  . Red Dye Itching    Patient Measurements: Height: 4\' 11"  (149.9 cm) Weight: 112 lb 3.4 oz (50.9 kg) IBW/kg (Calculated) : 43.2 Heparin Dosing Weight: 50.9  Vital Signs: Temp: 97.5 F (36.4 C) (07/27 0731) Temp Source: Oral (07/27 0731) BP: 118/61 mmHg (07/27 0800) Pulse Rate: 73 (07/27 0800)  Labs:  Recent Labs  07/31/14 0500  08/01/14 0043 08/01/14 0852 08/01/14 1556 08/01/14 2020 08/02/14 0235 08/02/14 1020  HGB 8.1*  --  10.0*  --   --   --  10.0*  --   HCT 24.0*  --  29.7*  --   --   --  29.8*  --   PLT 156  --  282  --   --   --  274  --   HEPARINUNFRC  --   --   --   --   --  <0.10*  --  0.27*  CREATININE 0.94  --  1.02*  --   --   --  1.31*  --   TROPONINI  --   < > 0.43* 2.38* 2.06* 1.48*  --   --   < > = values in this interval not displayed.  Estimated Creatinine Clearance: 20.6 mL/min (by C-G formula based on Cr of 1.31).   Medications:  Prescriptions prior to admission  Medication Sig Dispense Refill Last Dose  . aspirin 325 MG buffered tablet Take 325 mg by mouth daily.    07/19/14  . bimatoprost (LUMIGAN) 0.01 % SOLN Place 1 drop into both eyes every other day.    07/19/14  . Calcium Carbonate-Vitamin D (CALCIUM + D PO) Take 1 tablet by mouth daily.   1 week  . diazepam (VALIUM) 5 MG tablet Take 5 mg by mouth every 6 (six) hours as needed for anxiety.    1 week  . fish oil-omega-3 fatty acids 1000 MG capsule Take 1 g by mouth daily.   07/19/14  . Glucos-Chondroit-Hyaluron-MSM (GLUCOSAMINE CHONDROITIN JOINT) TABS Take 1 tablet by mouth daily.   07/19/14  . HYDROcodone-acetaminophen (NORCO/VICODIN) 5-325 MG per tablet Take 0.5-1 tablets by mouth every 6 (six) hours as needed for moderate pain.   07/26/2014 at Unknown time  . levothyroxine  (SYNTHROID, LEVOTHROID) 50 MCG tablet Take 1 tablet by mouth daily.  1 07/19/14  . Menthol-Methyl Salicylate (PAIN RELIEVING RUB EX) Apply 1 application topically as needed (pain).   07/19/14  . Multiple Vitamins-Minerals (MULTIVITAMIN WITH MINERALS) tablet Take 1 tablet by mouth daily.     07/19/14  . OVER THE COUNTER MEDICATION Take 1 tablet by mouth daily. Brain & Liver Vitamin   07/19/14  . diclofenac sodium (VOLTAREN) 1 % GEL Apply 1 application topically 4 (four) times daily as needed.   0 1 week    Assessment: 81 yo F admitted 07/27/2014 L shoulder surgery who experienced increased confusion, increased agitation, tachycardic, tachypneic and hypertensive, elevated lactic acid, WBC 14.5, Tm 101.9. Started Vancomycin and Zosyn for presumed sepsis. Developed SOB & chest tightness 7/25 evening, increased RR overnight, and Tp to 2.38 this AM.   Pharmacy consulted to dose heparin.  Anticoagulation: ACS Was on VTE ppx ASA BID & SCDs (d/c 7/22) Started heparin gtt + bolus, 1st level undetectable - drip infusing appropriately per RN Re-bolused 2000 units,  increased gtt to 650 units/hr.   HL 0.27, subtherapeutic (goal 0.3-0.7).  H/H low stable, plt WNL Given heparin level close to goal and pt's age, ht, & wt, will increase gtt by 2 units/kg/h without additional bolus  Goal of Therapy:  Heparin level 0.3-0.7 units/ml Monitor platelets by anticoagulation protocol: Yes   Plan:  Increase to 750 units/h Heparin level in 8 hours Daily heparin level and CBC  Wynelle Fanny 08/02/2014,11:46 AM  Addendum: Heparin level this evening is 0.34 which is therapeutic.   Will continue heparin at the same rate of 750 units/hr and recheck labs in AM.  Heide Guile, PharmD, BCPS-AQ ID Clinical Pharmacist Pager [phone]

## 2014-08-02 NOTE — Progress Notes (Signed)
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   Post-operatively 7/22 in the early am, the patient developed delirium, hallucinations, fever, and hypoxia. She was transferred to SDU and worked up for sepsis by [facility]. She was later noted to have left arm swelling and erythema. As well as a hgb drop from 10.8 to 7.6. CT of the arm was ordered per [facility] and is pending to further evaluate L arm swelling. The patient was typed and screened for 2 units PRBC's. PCCM consulted for evaluation 7/22 with decline in clinical status.   HPI/Subjective: 7/27 A/O x 4 negative SOB, negative CP, negative diaphoresis, negative N/V   Assessment/Plan: Sepsis unspecified organism/aspiration PNA vs joint infection. -Afebrile, however leukocytosis trending up will still believe most likely cause Stress Demargination; if continues to trend up will pain culture - blood cultures NGTD -Urinalysis positive pseudomonas in sensitive -Remains hemodynamically stable but continue to monitor;   Acute respiratory failure with hypoxia -Interventional cardiology felt MI less likely; most likely secondary to stress cardiomyopathy, therefore cardiac catheterization capsule -Troponins trending down   Pulmonary Edema /Lt pleural effusion? -7/26 PCXR  c/w  Pulmonary Edema also possible Pleural effusion -PCXR in a.m.  ACS vs NSTEMI -7/26 troponin= 2.38; trending down -Strict in and out; since admission + 6.8 L -Daily weight -7/27 patient was taken to cardiac catheterization lab yesterday however interventional cardiologist felt that  patient was not having NSTEMI and therefore did not perform catheterization. Felt more likely diagnosis was stress cardiomyopathy. Recommended aggressive diuresis.  CKD, stage III (baseline Cr~1.1-1.4) -Currently at baseline  UTI Pseudomonas pansensitive - Continue ciprofloxacin; complete 5 day course of antibiotics  Known chronic left rotator cuff status post reversed shoulder arthroplasty -Management per orthopedic team -Marked edema and bruising left upper extremity below operative site so check CT left upper extremity to rule out hematoma, seroma or abscess  History of iron deficiency anemia  -Baseline hemoglobin 10.8; patient at baseline   Acute Blood Loss Anemia - suspected L arm hemorrhage post surgery, r/o other etiology -Tansfuse for Hb < 8  Protein calorie malnutrition.  Hypothyroidism  -TSH WNL -Continue Synthroid 50 uq Daily  Acute Encephalopathy - in setting of delirium  - Resolved   Code Status: Partial  Family Communication: Son present at time of exam Disposition Plan: Per cardiology    Consultants: Dr Mertie Moores (Cardiology) Dr Tania Ade (Orthopedics) Dr Rush Farmer (PCCM)   Procedure/Significant Events: 7/21LEFT REVERSE SHOULDER ARTHROPLASTY   7/23 echocardiogram;- LVEF= 65%. -findings c/w Lt ventricular diastolic dysfunction. - Pulmonary arteries: PA peak pressure: 34 mm Hg (S). 7/26 echocardiogram;- LVEF=  20% to 25%. Akinesis mid-apicalanteroseptal myocardium. Akinesis of the mid-apicalinferior myocardium. - (grade 2diastolic dysfunction). - Mitral valve: moderate regurgitation.-Left atrium:  moderately dilated.-Tricuspid valve: moderate regurgitation. - Pulmonary arteries: PA peak pressure: 63 mm Hg (S).   Culture 7/22 blood x2 >>>NTD 7/22 urine >>> positive pseudomonas pan sensitive Sputum 7/22>>>NTD   Antibiotics: Vancomycin 7/22>>>7/25 Zosyn 7/22>>>7/25 Cipro 7/25>>>   DVT prophylaxis: ACS Heparin     Devices    LINES / TUBES:  CVL L IJ TLC 7/22>>>     Continuous Infusions: .  sodium chloride 5 mL/hr (08/01/14 1244)  . heparin 750 Units/hr (08/02/14 1145)    Objective: VITAL SIGNS: Temp: 98 F (36.7 C) (07/27 2000) Temp Source: Oral (07/27 2000) BP: 119/54 mmHg (07/27 2000) Pulse Rate: 70 (07/27 2000) SPO2; FIO2:   Intake/Output Summary (Last 24 hours) at 08/02/14 2117 Last data filed at 08/02/14 2100  Gross per 24 hour  Intake 463.49 ml  Output    850 ml  Net -386.51 ml     Exam: General: A/O 4, NAD, negative acute respiratory distress on room air Eyes: Negative headache, negative scleral hemorrhage ENT: Negative Runny nose, negative ear pain, negative tinnitus, negative gingival bleeding Neck:  Negative scars, masses, torticollis, lymphadenopathy, JVD Lungs: Clear to auscultation bilaterally without wheezes or crackles Cardiovascular: Regular rate and rhythm without murmur gallop or rub normal S1 and S2 Abdomen:negative abdominal pain, negative dysphagia, Nontender, nondistended, soft, bowel sounds positive, no rebound, no ascites, no appreciable mass Extremities: No significant cyanosis, clubbing, or edema bilateral lower extremities Psychiatric:  Negative depression, negative anxiety, negative fatigue, negative mania  Neurologic:  Cranial nerves II through XII intact, tongue/uvula midline, all extremities muscle strength 5/5, sensation intact throughout,negative dysarthria, negative expressive aphasia, negative receptive aphasia.      Data Reviewed: Basic Metabolic Panel:  Recent Labs Lab 07/29/14 0300 07/30/14 0358 07/31/14 0500 08/01/14 0043 08/02/14 0235  NA 136 139 141 142 140  K 4.1 3.5 3.7 3.6 5.4*  CL 109 114* 118* 111 111  CO2 18* 19* 19* 19* 16*  GLUCOSE 83 109* 88 225* 121*  BUN 19 19 13 14  23*  CREATININE 1.25* 0.99 0.94 1.02* 1.31*  CALCIUM 7.2* 6.8* 6.9* 8.1* 8.6*  MG 1.6* 2.1 1.8 1.7 1.6*  PHOS 3.6 1.9* 2.2* 3.6  --     Liver Function Tests:  Recent Labs Lab 07/28/14 1532  AST 70*  ALT 14  ALKPHOS 28*  BILITOT 0.9  PROT 5.2*  ALBUMIN 3.0*   No results for input(s): LIPASE, AMYLASE in the last 168 hours. No results for input(s): AMMONIA in the last 168 hours. CBC:  Recent Labs Lab 07/28/14 1055  07/29/14 1935 07/30/14 0358 07/31/14 0500 08/01/14 0043 08/02/14 0235  WBC 17.4*  < > 11.2* 10.0 8.9 14.6* 22.0*  NEUTROABS 14.5*  --   --   --   --   --   --   HGB 7.6*  < > 8.6* 8.2* 8.1* 10.0* 10.0*  HCT 22.4*  < > 24.6* 24.1* 24.0* 29.7* 29.8*  MCV 89.2  < > 87.2 86.4 88.9 88.7 90.6  PLT 229  < > 130* 130* 156 282 274  < > = values in this interval not displayed. Cardiac Enzymes:  Recent Labs Lab 07/28/14 1532 08/01/14 0043 08/01/14 0852 08/01/14 1556 08/01/14 2020  TROPONINI 0.55* 0.43* 2.38* 2.06* 1.48*   BNP (last 3 results)  Recent Labs  08/01/14 1924  BNP >4500.0*    ProBNP (last 3 results) No results for input(s): PROBNP in the last 8760 hours.  CBG:  Recent Labs Lab 07/30/14 1938 07/31/14 0851 07/31/14 1157 07/31/14 1619 07/31/14 2144  GLUCAP 100* 94 78 77 79    Recent Results (from the past 240 hour(s))  Culture, blood (routine x 2)     Status: None   Collection Time: 07/28/14  7:25 AM  Result Value Ref Range Status   Specimen Description BLOOD LEFT ARM  Final   Special Requests BOTTLES DRAWN AEROBIC AND ANAEROBIC 5CC  Final   Culture NO  GROWTH 5 DAYS  Final   Report Status 08/02/2014 FINAL  Final  Culture, blood (routine x 2)     Status: None   Collection Time: 07/28/14  7:35 AM  Result Value Ref Range Status   Specimen Description BLOOD RIGHT ANTECUBITAL  Final   Special Requests BOTTLES DRAWN AEROBIC AND ANAEROBIC 5CC  Final   Culture NO GROWTH 5 DAYS  Final   Report Status 08/02/2014 FINAL  Final  Urine culture     Status: None   Collection Time: 07/28/14 10:44 AM  Result Value Ref Range Status   Specimen Description URINE, CATHETERIZED   Final   Special Requests NONE  Final   Culture 40,000 COLONIES/ml PSEUDOMONAS AERUGINOSA  Final   Report Status 07/31/2014 FINAL  Final   Organism ID, Bacteria PSEUDOMONAS AERUGINOSA  Final      Susceptibility   Pseudomonas aeruginosa - MIC*    CEFTAZIDIME <=1 SENSITIVE Sensitive     CIPROFLOXACIN 0.5 SENSITIVE Sensitive     GENTAMICIN <=1 SENSITIVE Sensitive     IMIPENEM <=0.25 SENSITIVE Sensitive     PIP/TAZO <=4 SENSITIVE Sensitive     CEFEPIME <=1 SENSITIVE Sensitive     * 40,000 COLONIES/ml PSEUDOMONAS AERUGINOSA  MRSA PCR Screening     Status: None   Collection Time: 07/28/14 12:02 PM  Result Value Ref Range Status   MRSA by PCR NEGATIVE NEGATIVE Final    Comment:        The GeneXpert MRSA Assay (FDA approved for NASAL specimens only), is one component of a comprehensive MRSA colonization surveillance program. It is not intended to diagnose MRSA infection nor to guide or monitor treatment for MRSA infections.   Urine culture     Status: None   Collection Time: 07/28/14  3:27 PM  Result Value Ref Range Status   Specimen Description URINE, CATHETERIZED  Final   Special Requests NONE  Final   Culture NO GROWTH 1 DAY  Final   Report Status 07/29/2014 FINAL  Final  Culture, blood (x 2)     Status: None   Collection Time: 07/28/14  3:30 PM  Result Value Ref Range Status   Specimen Description BLOOD RIGHT NECK  Final   Special Requests BOTTLES DRAWN AEROBIC ONLY 10CC RIGHT IJ  Final   Culture NO GROWTH 5 DAYS  Final   Report Status 08/02/2014 FINAL  Final  Culture, blood (x 2)     Status: None   Collection Time: 07/28/14  3:40 PM  Result Value Ref Range Status   Specimen Description BLOOD LEFT A-LINE  Final   Special Requests BOTTLES DRAWN AEROBIC ONLY 10C  Final   Culture NO GROWTH 5 DAYS  Final   Report Status 08/02/2014 FINAL  Final  Culture, respiratory (NON-Expectorated)     Status: None   Collection Time: 07/28/14  4:35 PM  Result Value Ref Range Status    Specimen Description TRACHEAL ASPIRATE  Final   Special Requests Normal  Final   Gram Stain   Final    FEW WBC PRESENT,BOTH PMN AND MONONUCLEAR RARE SQUAMOUS EPITHELIAL CELLS PRESENT NO ORGANISMS SEEN Performed at [facility]    Culture   Final    NO GROWTH 2 DAYS Performed at [facility]    Report Status 07/31/2014 FINAL  Final     Studies:  Recent x-ray studies have been reviewed in detail by the Attending Physician  Scheduled Meds:  Scheduled Meds: . antiseptic oral rinse  7 mL Mouth Rinse  BID  . ciprofloxacin  250 mg Oral Q breakfast  . levothyroxine  50 mcg Oral QAC breakfast  . pantoprazole  40 mg Oral Daily  . sodium chloride  3 mL Intravenous Q12H    Time spent on care of this patient: 40 mins   Melodi Happel, Geraldo Docker , MD  [facility] Office  [phone] Pager [phone]  On-Call/Text Page:      [email or web address]      password [email or web address]  If 7PM-7AM, please contact night-coverage [email or web address] Password [facility] 08/02/2014, 9:17 PM   LOS: 6 days   Care during the described time interval was provided by me .  I have reviewed this patient's available data, including medical history, events of note, physical examination, and all test results as part of my evaluation. I have personally reviewed and interpreted all radiology studies.   Dia Crawford, MD [phone] Pager

## 2014-08-02 NOTE — Progress Notes (Signed)
Physical Therapy Treatment Patient Details Name: Andrea Gilmore MRN: [id number] DOB: [birth year] Today's Date: 08/02/2014    History of Present Illness Pt admitted for L reverse TSA on 7/21. Transferred to ICU 7/22 due to delirium, hallucinations, fever, hypoxia and anemia and intubated. Extubated 7/24    PT Comments    Pt progressing today with ability to walk short distance as well as perform transfers multiple times. Continues to require +2 for safe mobility but progressed from needing mod A to min A during session. Does much better with bilateral AFO's, left on for return to bed. PT will continue to follow.    Follow Up Recommendations  SNF;Supervision/Assistance - 24 hour     Equipment Recommendations  None recommended by PT    Recommendations for Other Services       Precautions / Restrictions Precautions Precautions: Fall;Shoulder Type of Shoulder Precautions: conservative Shoulder Interventions: Shoulder sling/immobilizer;Off for dressing/bathing/exercises Precaution Booklet Issued: No Precaution Comments: pt needs repeated reminders to not use L UE Required Braces or Orthoses: Sling Restrictions Weight Bearing Restrictions: Yes LUE Weight Bearing: Non weight bearing    Mobility  Bed Mobility Overal bed mobility: Needs Assistance Bed Mobility: Rolling;Sidelying to Sit Rolling: Min guard Sidelying to sit: Min assist Supine to sit: Min assist     General bed mobility comments: discussed getting out and into bed on right side to avoid lying on left. Later when pt was assisted back to bed by RN and NT,. PT present and discussed with pt the need for her to speak up about which side of the bed she needs to get in on. She voiced understanding  Transfers Overall transfer level: Needs assistance Equipment used: None;Straight cane Transfers: Sit to/from [facility] Sit to Stand: +2 physical assistance;Mod assist;Min assist Stand pivot transfers: +2  physical assistance;Mod assist       General transfer comment: pt performed sit to stand and SPT multiple times from bed, BSC, and chair, improved with each trial. Though after sitting in chair 2 hrs, required mod A again for back to bed due to fatigue. With practice, advanced to min A +2. Assist needed for power up, fwd wt-shift, and to steady  Ambulation/Gait Ambulation/Gait assistance: +2 physical assistance;Min assist Ambulation Distance (Feet): 4 Feet   Gait Pattern/deviations: Step-through pattern;Shuffle;Decreased stride length Gait velocity: decreased Gait velocity interpretation: <1.8 ft/sec, indicative of risk for recurrent falls General Gait Details: bilateral AFO donned for transfers and ambulation which really helped pt with knee and ankle control.    Stairs            Wheelchair Mobility    Modified Rankin (Stroke Patients Only)       Balance Overall balance assessment: Needs assistance Sitting-balance support: Feet supported Sitting balance-Leahy Scale: Fair     Standing balance support: Single extremity supported Standing balance-Leahy Scale: Poor Standing balance comment: practiced standing with cane and pt able to maintain balance with min A. Worked on wt shifting in standing as well as tolerance for standing activity. Frequent cues to let L arm remain still by side                    Cognition Arousal/Alertness: Awake/alert Behavior During Therapy: WFL for tasks assessed/performed Overall Cognitive Status: Impaired/Different from baseline Area of Impairment: Memory     Memory: Decreased short-term memory       Problem Solving: Slow processing General Comments: may be close to baseline but repeats self several times and needs frequent  reminders re: precautions    Exercises Shoulder Exercises Elbow Flexion: Left;10 reps;Seated;AROM Elbow Extension: Left;10 reps;Seated;AROM    General Comments General comments (skin integrity, edema,  etc.): HR 81 bpm, O2 sats 100% after session      Pertinent Vitals/Pain Pain Assessment: No/denies pain    Home Living                      Prior Function            PT Goals (current goals can now be found in the care plan section) Acute Rehab PT Goals Patient Stated Goal: be able to return to independent function PT Goal Formulation: With patient Time For Goal Achievement: 08/14/14 Potential to Achieve Goals: Fair Progress towards PT goals: Progressing toward goals    Frequency  Min 3X/week    PT Plan Discharge plan needs to be updated    Co-evaluation PT/OT/SLP Co-Evaluation/Treatment: Yes Reason for Co-Treatment: Complexity of the patient's impairments (multi-system involvement);For patient/therapist safety PT goals addressed during session: Mobility/safety with mobility;Balance;Proper use of DME;Strengthening/ROM OT goals addressed during session: ADL's and self-care;Strengthening/ROM     End of Session Equipment Utilized During Treatment: Gait belt (sling) Activity Tolerance: Patient tolerated treatment well Patient left: in chair;with chair alarm set;with family/visitor present;with nursing/sitter in room;with call bell/phone within reach     Time: 0932-3557 PT Time Calculation (min) (ACUTE ONLY): 55 min  Charges:  $Gait Training: 8-22 mins $Therapeutic Activity: 8-22 mins                    G Codes:     Leighton Roach, PT  Acute Rehab Services  [phone]  Leighton Roach 08/02/2014, 12:32 PM

## 2014-08-02 NOTE — Clinical Social Work Note (Signed)
Clinical Social Work Assessment  Patient Details  Name: Andrea Gilmore MRN: [id number] Date of Birth: 1925-10-07  Date of referral:  08/02/14               Reason for consult:  Facility Placement                Permission sought to share information with:    Permission granted to share information::  Yes, Verbal Permission Granted  Name::        Agency::  [facility]  Relationship::     Contact Information:     Housing/Transportation Living arrangements for the past 2 months:  Single Family Home Source of Information:  Patient Patient Interpreter Needed:  None Criminal Activity/Legal Involvement Pertinent to Current Situation/Hospitalization:  No - Comment as needed Significant Relationships:  Adult Children Lives with:  Self Do you feel safe going back to the place where you live?  Yes Need for family participation in patient care:  No (Coment)  Care giving concerns: pt lives alone- has two adult children nearby who can help if needed   Facilities manager / plan: CSW discussed PT recommendation for SNF  Employment status:  Retired Forensic scientist:  Medicare PT Recommendations:  [place] / Referral to community resources:  [place]  Patient/Family's Response to care:  Pt is agreeable to SNF and had planned on going to [place] when she is ready for DC- has already discussed with Dr and states that [place] is aware  Patient/Family's Understanding of and Emotional Response to Diagnosis, Current Treatment, and Prognosis:  No concerns or questions at this time  Emotional Assessment Appearance:  Appears stated age Attitude/Demeanor/Rapport:  Lethargic Affect (typically observed):  Appropriate, Pleasant Orientation:  Oriented to Self, Oriented to Place, Oriented to  Time, Oriented to Situation Alcohol / Substance use:  Not Applicable Psych involvement (Current and /or in the community):  No  (Comment)  Discharge Needs  Concerns to be addressed:  Care Coordination Readmission within the last 30 days:  No Current discharge risk:  Physical Impairment Barriers to Discharge:  Continued Medical Work up   [employer], [place], LCSW 08/02/2014, 3:21 PM

## 2014-08-02 NOTE — Progress Notes (Signed)
Pt is currently on a 4 LPM [place]. Her BS are clear and diminished. Sat is 100 RR 21. No distress noted. RT will continue to monitor.

## 2014-08-02 NOTE — Progress Notes (Signed)
[facility] Progress Note Patient Name: Andrea Gilmore DOB: 1925-11-09 MRN: [id number]   Date of Service  08/02/2014  HPI/Events of Note  Called to transfer patient to stepdown unit to make room for a new STEMI patient. Patient is hemodynamically stable with BP = 102/50 and HR = 70. Sat on 1 L/min [place] O2 = 98% and RR = 22.   eICU Interventions  Will order patient transferred to stepdown unit.      Intervention Category Minor Interventions: Routine modifications to care plan (e.g. PRN medications for pain, fever)  Sommer,Steven Eugene 08/02/2014, 8:00 PM

## 2014-08-02 NOTE — Progress Notes (Signed)
PULMONARY / CRITICAL CARE MEDICINE   Name: Andrea Gilmore MRN: [id number] DOB: 16-Dec-1925    ADMISSION DATE:  07/27/2014 CONSULTATION DATE:  07/28/14  REFERRING MD :  Dr. Tamera Gilmore   CHIEF COMPLAINT:  Acute Blood Loss Anemia post-op reverse shoulder arthroplasty   INITIAL PRESENTATION: 81 y/o F with PMH of hypothyroidism, prior breast cancer and osteroarthritis who was admitted 7/21 for planned left shoulder reverse arthroplasty.  Post-operatively 7/22, the patient developed delirium, hallucinations, fever, and hypoxia.  She was transferred to SDU and worked up for sepsis by [facility].  She was later noted to have left arm swelling and erythema.  PCCM consulted for evaluation 7/22.   STUDIES:  7/22 CXR >> left pleural effusion, hiatal hernia, gaseous distention of the stomach, L shoulder replacement  SIGNIFICANT EVENTS: 7/21  Admit for planned L should reverse arthroplasty  7/22  Early am developed delirium overnight, fever, hallucinations, hypoxia.  Arm swelling, hgb drop from 10.8 to 7.6 concerning for post-op bleeding   HISTORY OF PRESENT ILLNESS:  81 y/o F with PMH of hypothyroidism, prior breast cancer and osteroarthritis who was admitted 7/21 for planned left shoulder reverse arthroplasty.   Post-operatively 7/22 in the early am, the patient developed delirium, hallucinations, fever, and hypoxia.  She was transferred to SDU and worked up for sepsis by [name].  She was later noted to have left arm swelling and erythema.  As well as a hgb drop from 10.8 to 7.6.  CT of the arm was ordered per [name] and is pending to further evaluate L arm swelling.  The patient was typed and screened for 2 units PRBC's.  PCCM consulted for evaluation 7/22 with decline in clinical status.   SUBJECTIVE: Decompensated again overnight and required BiPAP, will attempt to remove today.  VITAL SIGNS: Temp:  [97.5 F (36.4 C)-98 F (36.7 C)] 97.5 F (36.4 C) (07/27 0731) Pulse Rate:  [66-96] 73 (07/27 0800) Resp:   [16-31] 24 (07/27 0800) BP: (95-143)/(33-116) 118/61 mmHg (07/27 0800) SpO2:  [96 %-100 %] 100 % (07/27 0800) FiO2 (%):  [40 %] 40 % (07/27 0300) Weight:  [50.9 kg (112 lb 3.4 oz)] 50.9 kg (112 lb 3.4 oz) (07/27 0357)   HEMODYNAMICS:     VENTILATOR SETTINGS: Vent Mode:  [-]  FiO2 (%):  [40 %] 40 %   INTAKE / OUTPUT:  Intake/Output Summary (Last 24 hours) at 08/02/14 1218 Last data filed at 08/02/14 1130  Gross per 24 hour  Intake 404.21 ml  Output   1850 ml  Net -1445.79 ml   PHYSICAL EXAMINATION: General:  Elderly appearing female, moderate respiratory distress this AM. Neuro:  Awake and interactive, following all commands. HEENT:  [place]/AT, PERRL, EOM-I and MMM. Cardiovascular:  RRR, Nl S1/S2, -M/R/G. Lungs:  Diffuse crackles. Abdomen:  Soft, NT, ND and +BS. Musculoskeletal:  -edema and -tenderness, erythema on the left shoulder and arm with significant enlargement. Skin:  As above, otherwise intact.  LABS:  CBC  Recent Labs Lab 07/31/14 0500 08/01/14 0043 08/02/14 0235  WBC 8.9 14.6* 22.0*  HGB 8.1* 10.0* 10.0*  HCT 24.0* 29.7* 29.8*  PLT 156 282 274   Coag's  Recent Labs Lab 07/28/14 1055 07/28/14 1532  APTT 28 30  INR 1.20 1.42   BMET  Recent Labs Lab 07/31/14 0500 08/01/14 0043 08/02/14 0235  NA 141 142 140  K 3.7 3.6 5.4*  CL 118* 111 111  CO2 19* 19* 16*  BUN 13 14 23*  CREATININE 0.94 1.02* 1.31*  GLUCOSE 88 225* 121*   Electrolytes  Recent Labs Lab 07/30/14 0358 07/31/14 0500 08/01/14 0043 08/02/14 0235  CALCIUM 6.8* 6.9* 8.1* 8.6*  MG 2.1 1.8 1.7 1.6*  PHOS 1.9* 2.2* 3.6  --    Sepsis Markers  Recent Labs Lab 07/28/14 1055 07/28/14 1534 07/29/14 0300 07/30/14 1100 07/31/14 0500 08/01/14 0043  LATICACIDVEN 2.1* 0.8 0.6  --   --   --   PROCALCITON 0.95  --   --  0.95 0.59 0.32   ABG  Recent Labs Lab 07/28/14 1549 07/29/14 0318 07/30/14 0408  PHART 7.334* 7.336* 7.349*  PCO2ART 42.2 37.0 35.0  PO2ART 411.0*  143* 177*   Liver Enzymes  Recent Labs Lab 07/28/14 1532  AST 70*  ALT 14  ALKPHOS 28*  BILITOT 0.9  ALBUMIN 3.0*   Cardiac Enzymes  Recent Labs Lab 08/01/14 0852 08/01/14 1556 08/01/14 2020  TROPONINI 2.38* 2.06* 1.48*   Glucose  Recent Labs Lab 07/30/14 1149 07/30/14 1938 07/31/14 0851 07/31/14 1157 07/31/14 1619 07/31/14 2144  GLUCAP 80 100* 94 78 77 79    Imaging I reviewed CXR myself, no acute disease.  ASSESSMENT / PLAN:  PULMONARY OETT 7/22 >> 7/25 A: Acute Hypoxic Respiratory Failure / Respiratory Distress- new O2 requirement 7/22 L Pleural Effusion  Recurrent respiratory failure due to acute pulmonary edema. P:   - IS per RT protocol. - Family OK for short term intubation only. - Titrate O2 for sat. - Aspiration precautions. - Diurese and hopefully will be able to change BiPAP to PRN. - Additional diureses ordered below. - Maintain foley catheter.  CARDIOVASCULAR CVL L IJ TLC 7/22>>> A:  Tachycardia - in setting of respiratory distress  P:  - Continue close monitoring. - TRH calling cardiology. - EKG as ordered. - Additional diureses ordered. - DNR, no cardioversion or CPR, short term intubation only. - Follow CVP.  - Heparin for a total of 48 hours then d/c.  RENAL A:   Metabolic Acidosis - lactic acidosis +/- component AKI AKI  P:   - Trend BMP / UOP. - Replace electrolytes as indicated. - KVO IVF. - Lasix 20 mg IV q8 x2 doses.  GASTROINTESTINAL A:   Protein calorie malnutrition. P:   - D/C OGT post extubation. - D/C TF. - Heart healthy diet.  HEMATOLOGIC A:   Acute Blood Loss Anemia - suspected L arm hemorrhage post surgery, r/o other etiology Hx Breast Cancer P:  - T&S. - Transfuse per ICU protocol.  INFECTIOUS A:   Fever, concern for aspiration PNA vs joint infection. P:   BCx2 7/22>>>NTD UC 7/22>>>pseudomonas pan sensitive Sputum 7/22>>>NTD  Vancomycin 7/22>>>7/25 Zosyn 7/22>>>7/25 Cipro  7/25>>> Sepsis protocol complete. Procalcitonin 0.59. D/C vancomycin and zosyn. Continue cipro.  ENDOCRINE A:   Hyperglycemia   Hypothyroidism  P:   - ISS - CBGs   NEUROLOGIC A:    Acute Encephalopathy - in setting of delirium  P:   RASS goal: 0 D/C versed/fentanyl. PRN percocet for pain, hold while in respiratory failure however.  Ortho: significant erythema and enlargement of the left arm, below surgical site.  Ortho evaluated, appreciate input.  FAMILY  - Updates: Patient updated at bedside along with her son.  The patient is critically ill with multiple organ systems failure and requires high complexity decision making for assessment and support, frequent evaluation and titration of therapies, application of advanced monitoring technologies and extensive interpretation of multiple databases.   Critical Care Time devoted to patient care services described  in this note is  35  Minutes. This time reflects time of care of this signee Dr [name]. This critical care time does not reflect procedure time, or teaching time or supervisory time of PA/NP/Med student/Med Resident etc but could involve care discussion time.  Rush Farmer, M.D. [facility] Pulmonary/Critical Care Medicine. Pager: [phone]. After hours pager: [phone].

## 2014-08-02 NOTE — Progress Notes (Signed)
   PATIENT ID: Andrea Gilmore   6 Days Post-Op Procedure(s) (LRB): REVERSE SHOULDER ARTHROPLASTY (Left)  Subjective: Reports doing well this am. Lying in bed at 30 deg with nasal cannula and breathing/resting comfortably. Denies shoulder pain, chest pain.   Objective:  Filed Vitals:   08/02/14 0759  BP: 97/51  Pulse: 74  Temp:   Resp: 26     Alert and oriented. Left shoulder dressing with scant dry blood. Distally left hand neurovascularly intact  Labs:   Recent Labs  07/31/14 0500 08/01/14 0043 08/02/14 0235  HGB 8.1* 10.0* 10.0*   Recent Labs  08/01/14 0043 08/02/14 0235  WBC 14.6* 22.0*  RBC 3.35* 3.29*  HCT 29.7* 29.8*  PLT 282 274   Recent Labs  08/01/14 0043 08/02/14 0235  NA 142 140  K 3.6 5.4*  CL 111 111  CO2 19* 16*  BUN 14 23*  CREATININE 1.02* 1.31*  GLUCOSE 225* 121*  CALCIUM 8.1* 8.6*    Assessment and Plan: Postoperative day 6 status post left reverse total shoulder replacement complicated by postoperative decline, recent elevated troponin with concern for cardiac ischemia, determined not to be a good candidate for cath orientated and alert Critical Care involved again, we appreciate their help Stepdown currently, possibility to move to floor soon L UE in sling, okay for hand, wrist elbow ROM. Dressing will stay on until follow up visit with Dr. Tamera Punt.  Will get up with PT today dispo TBD- CIR vs SNF  VTE proph: ASA 325mg  BID SCDs

## 2014-08-03 ENCOUNTER — Inpatient Hospital Stay (HOSPITAL_COMMUNITY): Payer: Medicare Other

## 2014-08-03 DIAGNOSIS — N3000 Acute cystitis without hematuria: Secondary | ICD-10-CM | POA: Insufficient documentation

## 2014-08-03 DIAGNOSIS — I429 Cardiomyopathy, unspecified: Secondary | ICD-10-CM

## 2014-08-03 DIAGNOSIS — J690 Pneumonitis due to inhalation of food and vomit: Secondary | ICD-10-CM | POA: Insufficient documentation

## 2014-08-03 LAB — CBC
HEMATOCRIT: 27.8 % — AB (ref 36.0–46.0)
Hemoglobin: 9.2 g/dL — ABNORMAL LOW (ref 12.0–15.0)
MCH: 30.4 pg (ref 26.0–34.0)
MCHC: 33.1 g/dL (ref 30.0–36.0)
MCV: 91.7 fL (ref 78.0–100.0)
Platelets: 289 10*3/uL (ref 150–400)
RBC: 3.03 MIL/uL — ABNORMAL LOW (ref 3.87–5.11)
RDW: 15.8 % — AB (ref 11.5–15.5)
WBC: 15.1 10*3/uL — AB (ref 4.0–10.5)

## 2014-08-03 LAB — BASIC METABOLIC PANEL
ANION GAP: 9 (ref 5–15)
BUN: 30 mg/dL — ABNORMAL HIGH (ref 6–20)
CO2: 23 mmol/L (ref 22–32)
Calcium: 8.6 mg/dL — ABNORMAL LOW (ref 8.9–10.3)
Chloride: 106 mmol/L (ref 101–111)
Creatinine, Ser: 1.42 mg/dL — ABNORMAL HIGH (ref 0.44–1.00)
GFR calc Af Amer: 37 mL/min — ABNORMAL LOW (ref >60)
GFR calc non Af Amer: 32 mL/min — ABNORMAL LOW (ref >60)
GLUCOSE: 78 mg/dL (ref 65–99)
Potassium: 4.1 mmol/L (ref 3.5–5.1)
Sodium: 138 mmol/L (ref 135–145)

## 2014-08-03 LAB — MAGNESIUM: Magnesium: 2 mg/dL (ref 1.7–2.4)

## 2014-08-03 LAB — PHOSPHORUS: PHOSPHORUS: 2.8 mg/dL (ref 2.5–4.6)

## 2014-08-03 LAB — HEPARIN LEVEL (UNFRACTIONATED): Heparin Unfractionated: 0.47 IU/mL (ref 0.30–0.70)

## 2014-08-03 MED ORDER — FUROSEMIDE 10 MG/ML IJ SOLN
20.0000 mg | Freq: Three times a day (TID) | INTRAMUSCULAR | Status: AC
  Administered 2014-08-03 – 2014-08-04 (×2): 20 mg via INTRAVENOUS
  Filled 2014-08-03 (×2): qty 2

## 2014-08-03 NOTE — Progress Notes (Signed)
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   Post-operatively 7/22 in the early am, the patient developed delirium, hallucinations, fever, and hypoxia. She was transferred to SDU and worked up for sepsis by [facility]. She was later noted to have left arm swelling and erythema. As well as a hgb drop from 10.8 to 7.6. CT of the arm was ordered per [facility] and is pending to further evaluate L arm swelling. The patient was typed and screened for 2 units PRBC's. PCCM consulted for evaluation 7/22 with decline in clinical status.   HPI/Subjective: 7/28 A/O x 4 negative SOB, negative CP, negative diaphoresis, negative N/V   Assessment/Plan: Sepsis unspecified organism/aspiration PNA vs joint infection. -Afebrile, however leukocytosis trending up will still believe most likely cause Stress Demargination; if continues to trend up will pain culture - blood cultures NGTD -Urinalysis positive pseudomonas pan sensitive -Remains hemodynamically stable but continue to monitor;   Acute respiratory failure with hypoxia -Interventional cardiology felt MI less likely; most likely secondary to stress cardiomyopathy, therefore cardiac catheterization canceled -Troponins trending down   Pulmonary Edema /Lt pleural effusion? -7/26 PCXR  c/w  Pulmonary Edema also possible Pleural effusion -7/28 PCXR pulmonary edema probably slightly improved  ACS vs NSTEMI -7/26 troponin= 2.38; trending down -Strict in and out; since admission + 6.2 L -Daily weight -Felt more likely diagnosis was stress cardiomyopathy. Recommended  aggressive diuresis. -Lasix 20 mg 2 doses  CKD, stage III (baseline Cr~1.1-1.4) -Currently at baseline  UTI Pseudomonas pansensitive - Continue ciprofloxacin; complete 5 day course of antibiotics  Known chronic left rotator cuff status post reversed shoulder arthroplasty -Management per orthopedic team -Marked edema and bruising left upper extremity below operative site resolving  History of iron deficiency anemia  -Baseline hemoglobin 10.8; trending down slightly however no active bleeding observed  Acute Blood Loss Anemia - suspected L arm hemorrhage post surgery, r/o other etiology -Tansfuse for Hb < 8  Protein calorie malnutrition.  Hypothyroidism  -TSH WNL -Continue Synthroid 50 uq Daily    Code Status: Partial  Family Communication: Son present at time of exam Disposition Plan: Per cardiology    Consultants: Dr Mertie Moores (Cardiology) Dr Tania Ade (Orthopedics) Dr Rush Farmer (PCCM)   Procedure/Significant Events: 7/21LEFT REVERSE SHOULDER ARTHROPLASTY   7/23 echocardiogram;- LVEF= 65%. -findings c/w Lt ventricular diastolic dysfunction. - Pulmonary arteries: PA peak pressure: 34 mm Hg (S). 7/26 echocardiogram;- LVEF=  20% to 25%. Akinesis mid-apicalanteroseptal myocardium. Akinesis of the mid-apicalinferior myocardium. - (grade 2diastolic dysfunction). - Mitral valve: moderate regurgitation.-Left atrium:  moderately dilated.-Tricuspid valve: moderate regurgitation. - Pulmonary arteries: PA peak pressure: 63 mm Hg (S).   Culture 7/22 blood x2 >>>NTD 7/22 urine >>> positive pseudomonas pan sensitive Sputum 7/22>>>NTD   Antibiotics: Vancomycin 7/22>>>7/25 Zosyn 7/22>>>7/25 Cipro 7/25>>>   DVT prophylaxis: ACS Heparin    Devices    LINES / TUBES:  CVL L IJ TLC 7/22>>>     Continuous Infusions:    Objective: VITAL SIGNS: Temp: 98.1 F (36.7 C) (07/28 1127) Temp Source: Oral (07/28 1127) BP: 106/46 mmHg (07/28  1400) Pulse Rate: 72 (07/28 1200) SPO2; FIO2:   Intake/Output Summary (Last  24 hours) at 08/03/14 1536 Last data filed at 08/03/14 0900  Gross per 24 hour  Intake    220 ml  Output   1120 ml  Net   -900 ml     Exam: General: A/O 4, NAD, negative acute respiratory distress on room air Eyes: Negative headache, negative scleral hemorrhage ENT: Negative Runny nose, negative ear pain, negative tinnitus, negative gingival bleeding Neck:  Negative scars, masses, torticollis, lymphadenopathy, JVD Lungs: Clear to auscultation bilaterally without wheezes or crackles Cardiovascular: Regular rate and rhythm without murmur gallop or rub normal S1 and S2 Abdomen:negative abdominal pain, negative dysphagia, Nontender, nondistended, soft, bowel sounds positive, no rebound, no ascites, no appreciable mass Extremities: No significant cyanosis, clubbing, or edema bilateral lower extremities Psychiatric:  Negative depression, negative anxiety, negative fatigue, negative mania  Neurologic:  Cranial nerves II through XII intact, tongue/uvula midline, all extremities muscle strength 5/5, sensation intact throughout,negative dysarthria, negative expressive aphasia, negative receptive aphasia.      Data Reviewed: Basic Metabolic Panel:  Recent Labs Lab 07/29/14 0300 07/30/14 0358 07/31/14 0500 08/01/14 0043 08/02/14 0235 08/03/14 0251  NA 136 139 141 142 140 138  K 4.1 3.5 3.7 3.6 5.4* 4.1  CL 109 114* 118* 111 111 106  CO2 18* 19* 19* 19* 16* 23  GLUCOSE 83 109* 88 225* 121* 78  BUN 19 19 13 14  23* 30*  CREATININE 1.25* 0.99 0.94 1.02* 1.31* 1.42*  CALCIUM 7.2* 6.8* 6.9* 8.1* 8.6* 8.6*  MG 1.6* 2.1 1.8 1.7 1.6* 2.0  PHOS 3.6 1.9* 2.2* 3.6  --  2.8   Liver Function Tests:  Recent Labs Lab 07/28/14 1532  AST 70*  ALT 14  ALKPHOS 28*  BILITOT 0.9  PROT 5.2*  ALBUMIN 3.0*   No results for input(s): LIPASE, AMYLASE in the last 168 hours. No results for input(s): AMMONIA in the  last 168 hours. CBC:  Recent Labs Lab 07/28/14 1055  07/30/14 0358 07/31/14 0500 08/01/14 0043 08/02/14 0235 08/03/14 0251  WBC 17.4*  < > 10.0 8.9 14.6* 22.0* 15.1*  NEUTROABS 14.5*  --   --   --   --   --   --   HGB 7.6*  < > 8.2* 8.1* 10.0* 10.0* 9.2*  HCT 22.4*  < > 24.1* 24.0* 29.7* 29.8* 27.8*  MCV 89.2  < > 86.4 88.9 88.7 90.6 91.7  PLT 229  < > 130* 156 282 274 289  < > = values in this interval not displayed. Cardiac Enzymes:  Recent Labs Lab 07/28/14 1532 08/01/14 0043 08/01/14 0852 08/01/14 1556 08/01/14 2020  TROPONINI 0.55* 0.43* 2.38* 2.06* 1.48*   BNP (last 3 results)  Recent Labs  08/01/14 1924  BNP >4500.0*    ProBNP (last 3 results) No results for input(s): PROBNP in the last 8760 hours.  CBG:  Recent Labs Lab 07/30/14 1938 07/31/14 0851 07/31/14 1157 07/31/14 1619 07/31/14 2144  GLUCAP 100* 94 78 77 79    Recent Results (from the past 240 hour(s))  Culture, blood (routine x 2)     Status: None   Collection Time: 07/28/14  7:25 AM  Result Value Ref Range Status   Specimen Description BLOOD LEFT ARM  Final   Special Requests BOTTLES DRAWN AEROBIC AND ANAEROBIC 5CC  Final   Culture NO GROWTH 5 DAYS  Final   Report Status 08/02/2014 FINAL  Final  Culture, blood (routine x 2)     Status: None   Collection Time: 07/28/14  7:35  AM  Result Value Ref Range Status   Specimen Description BLOOD RIGHT ANTECUBITAL  Final   Special Requests BOTTLES DRAWN AEROBIC AND ANAEROBIC 5CC  Final   Culture NO GROWTH 5 DAYS  Final   Report Status 08/02/2014 FINAL  Final  Urine culture     Status: None   Collection Time: 07/28/14 10:44 AM  Result Value Ref Range Status   Specimen Description URINE, CATHETERIZED  Final   Special Requests NONE  Final   Culture 40,000 COLONIES/ml PSEUDOMONAS AERUGINOSA  Final   Report Status 07/31/2014 FINAL  Final   Organism ID, Bacteria PSEUDOMONAS AERUGINOSA  Final      Susceptibility   Pseudomonas aeruginosa - MIC*     CEFTAZIDIME <=1 SENSITIVE Sensitive     CIPROFLOXACIN 0.5 SENSITIVE Sensitive     GENTAMICIN <=1 SENSITIVE Sensitive     IMIPENEM <=0.25 SENSITIVE Sensitive     PIP/TAZO <=4 SENSITIVE Sensitive     CEFEPIME <=1 SENSITIVE Sensitive     * 40,000 COLONIES/ml PSEUDOMONAS AERUGINOSA  MRSA PCR Screening     Status: None   Collection Time: 07/28/14 12:02 PM  Result Value Ref Range Status   MRSA by PCR NEGATIVE NEGATIVE Final    Comment:        The GeneXpert MRSA Assay (FDA approved for NASAL specimens only), is one component of a comprehensive MRSA colonization surveillance program. It is not intended to diagnose MRSA infection nor to guide or monitor treatment for MRSA infections.   Urine culture     Status: None   Collection Time: 07/28/14  3:27 PM  Result Value Ref Range Status   Specimen Description URINE, CATHETERIZED  Final   Special Requests NONE  Final   Culture NO GROWTH 1 DAY  Final   Report Status 07/29/2014 FINAL  Final  Culture, blood (x 2)     Status: None   Collection Time: 07/28/14  3:30 PM  Result Value Ref Range Status   Specimen Description BLOOD RIGHT NECK  Final   Special Requests BOTTLES DRAWN AEROBIC ONLY 10CC RIGHT IJ  Final   Culture NO GROWTH 5 DAYS  Final   Report Status 08/02/2014 FINAL  Final  Culture, blood (x 2)     Status: None   Collection Time: 07/28/14  3:40 PM  Result Value Ref Range Status   Specimen Description BLOOD LEFT A-LINE  Final   Special Requests BOTTLES DRAWN AEROBIC ONLY 10C  Final   Culture NO GROWTH 5 DAYS  Final   Report Status 08/02/2014 FINAL  Final  Culture, respiratory (NON-Expectorated)     Status: None   Collection Time: 07/28/14  4:35 PM  Result Value Ref Range Status   Specimen Description TRACHEAL ASPIRATE  Final   Special Requests Normal  Final   Gram Stain   Final    FEW WBC PRESENT,BOTH PMN AND MONONUCLEAR RARE SQUAMOUS EPITHELIAL CELLS PRESENT NO ORGANISMS SEEN Performed at [facility]     Culture   Final    NO GROWTH 2 DAYS Performed at [facility]    Report Status 07/31/2014 FINAL  Final     Studies:  Recent x-ray studies have been reviewed in detail by the Attending Physician  Scheduled Meds:  Scheduled Meds: . antiseptic oral rinse  7 mL Mouth Rinse BID  . levothyroxine  50 mcg Oral QAC breakfast  . pantoprazole  40 mg Oral Daily  . sodium chloride  3 mL Intravenous Q12H    Time spent  on care of this patient: 40 mins   Pavlos Yon, Geraldo Docker , MD  [facility] Office  [phone] Pager [phone]  On-Call/Text Page:      [email or web address]      password [email or web address]  If 7PM-7AM, please contact night-coverage [email or web address] Password [email or web address] 08/03/2014, 3:36 PM   LOS: 7 days   Care during the described time interval was provided by me .  I have reviewed this patient's available data, including medical history, events of note, physical examination, and all test results as part of my evaluation. I have personally reviewed and interpreted all radiology studies.   Dia Crawford, MD [phone] Pager

## 2014-08-03 NOTE — Progress Notes (Signed)
Pt transferred to [facility]. V/S stable and pt resting comfortably. Report given to Randell Loop.

## 2014-08-03 NOTE — Progress Notes (Signed)
ANTICOAGULATION CONSULT NOTE - Follow Up Consult  Pharmacy Consult for heparin Indication: chest pain/ACS  Allergies  Allergen Reactions  . Morphine And Related Rash  . Codeine Rash  . Blue Dyes (Parenteral) Itching  . Red Dye Itching    Patient Measurements: Height: 4\' 11"  (149.9 cm) Weight: 111 lb 12.4 oz (50.7 kg) IBW/kg (Calculated) : 43.2 Heparin Dosing Weight: 50.9  Vital Signs: Temp: 97.9 F (36.6 C) (07/28 0340) Temp Source: Oral (07/28 0340) BP: 122/51 mmHg (07/28 0600) Pulse Rate: 68 (07/28 0700)  Labs:  Recent Labs  08/01/14 0043 08/01/14 0852 08/01/14 1556  08/01/14 2020 08/02/14 0235 08/02/14 1020 08/02/14 2010 08/03/14 0251  HGB 10.0*  --   --   --   --  10.0*  --   --  9.2*  HCT 29.7*  --   --   --   --  29.8*  --   --  27.8*  PLT 282  --   --   --   --  274  --   --  289  HEPARINUNFRC  --   --   --   < > <0.10*  --  0.27* 0.34 0.47  CREATININE 1.02*  --   --   --   --  1.31*  --   --  1.42*  TROPONINI 0.43* 2.38* 2.06*  --  1.48*  --   --   --   --   < > = values in this interval not displayed.  Estimated Creatinine Clearance: 19 mL/min (by C-G formula based on Cr of 1.42).   Medications:  Prescriptions prior to admission  Medication Sig Dispense Refill Last Dose  . aspirin 325 MG buffered tablet Take 325 mg by mouth daily.    07/19/14  . bimatoprost (LUMIGAN) 0.01 % SOLN Place 1 drop into both eyes every other day.    07/19/14  . Calcium Carbonate-Vitamin D (CALCIUM + D PO) Take 1 tablet by mouth daily.   1 week  . diazepam (VALIUM) 5 MG tablet Take 5 mg by mouth every 6 (six) hours as needed for anxiety.    1 week  . fish oil-omega-3 fatty acids 1000 MG capsule Take 1 g by mouth daily.   07/19/14  . Glucos-Chondroit-Hyaluron-MSM (GLUCOSAMINE CHONDROITIN JOINT) TABS Take 1 tablet by mouth daily.   07/19/14  . HYDROcodone-acetaminophen (NORCO/VICODIN) 5-325 MG per tablet Take 0.5-1 tablets by mouth every 6 (six) hours as needed for moderate pain.    07/26/2014 at Unknown time  . levothyroxine (SYNTHROID, LEVOTHROID) 50 MCG tablet Take 1 tablet by mouth daily.  1 07/19/14  . Menthol-Methyl Salicylate (PAIN RELIEVING RUB EX) Apply 1 application topically as needed (pain).   07/19/14  . Multiple Vitamins-Minerals (MULTIVITAMIN WITH MINERALS) tablet Take 1 tablet by mouth daily.     07/19/14  . OVER THE COUNTER MEDICATION Take 1 tablet by mouth daily. Brain & Liver Vitamin   07/19/14  . diclofenac sodium (VOLTAREN) 1 % GEL Apply 1 application topically 4 (four) times daily as needed.   0 1 week    Assessment: 81 yo F admitted 07/27/2014 L shoulder surgery who experienced increased confusion, increased agitation, tachycardic, tachypneic and hypertensive, elevated lactic acid, WBC 14.5, Tm 101.9. Started Vancomycin and Zosyn for presumed sepsis. Developed SOB & chest tightness 7/25 evening, increased RR overnight, and Tp to 2.38 this AM.   Pharmacy consulted to dose heparin.  Anticoagulation: ACS Was on VTE ppx ASA BID & SCDs (d/c 7/22) HL  0.47, therapeutic (goal 0.3-0.7).  H/H low stable, plt WNL On heparin 750 units/h  Goal of Therapy:  Heparin level 0.3-0.7 units/ml Monitor platelets by anticoagulation protocol: Yes   Plan:  Continue heparin 750 units/h Daily heparin level and CBC Heparin to be continued until noon today   Andrea Gilmore 08/03/2014,7:48 AM

## 2014-08-03 NOTE — Progress Notes (Signed)
SUBJECTIVE:  No CP.  Asking questiions about the days she was intubated.  SHe reports a cough.  OBJECTIVE:   Vitals:   Filed Vitals:   08/03/14 0600 08/03/14 0700 08/03/14 0800 08/03/14 0813  BP: 122/51  125/55   Pulse: 62 68 68   Temp:    98.4 F (36.9 C)  TempSrc:    Oral  Resp: 19 24 24    Height:      Weight:      SpO2: 95% 99% 97%    I&O's:    Intake/Output Summary (Last 24 hours) at 08/03/14 0900 Last data filed at 08/03/14 0800  Gross per 24 hour  Intake 457.76 ml  Output   1200 ml  Net -742.24 ml   TELEMETRY: Reviewed telemetry pt in NSR:     PHYSICAL EXAM General: Well developed, well nourished, in no acute distress Head:   Normal cephalic and atramatic  Lungs:   Coarse breath sounds bilaterally to auscultation. Heart:   HRRR S1 S2  No JVD.   Abdomen: abdomen soft and non-tender Msk:  Back normal,  Normal strength and tone for age. Extremities:   No edema.  Left arm in a sling. Neuro: Alert and oriented. Psych:  Normal affect, responds appropriately Skin: Bruising   LABS: Basic Metabolic Panel:  Recent Labs  08/01/14 0043 08/02/14 0235 08/03/14 0251  NA 142 140 138  K 3.6 5.4* 4.1  CL 111 111 106  CO2 19* 16* 23  GLUCOSE 225* 121* 78  BUN 14 23* 30*  CREATININE 1.02* 1.31* 1.42*  CALCIUM 8.1* 8.6* 8.6*  MG 1.7 1.6* 2.0  PHOS 3.6  --  2.8   Liver Function Tests: No results for input(s): AST, ALT, ALKPHOS, BILITOT, PROT, ALBUMIN in the last 72 hours. No results for input(s): LIPASE, AMYLASE in the last 72 hours. CBC:  Recent Labs  08/02/14 0235 08/03/14 0251  WBC 22.0* 15.1*  HGB 10.0* 9.2*  HCT 29.8* 27.8*  MCV 90.6 91.7  PLT 274 289   Cardiac Enzymes:  Recent Labs  08/01/14 0852 08/01/14 1556 08/01/14 2020  TROPONINI 2.38* 2.06* 1.48*   BNP: Invalid input(s): POCBNP D-Dimer: No results for input(s): DDIMER in the last 72 hours. Hemoglobin A1C: No results for input(s): HGBA1C in the last 72 hours. Fasting Lipid  Panel: No results for input(s): CHOL, HDL, LDLCALC, TRIG, CHOLHDL, LDLDIRECT in the last 72 hours. Thyroid Function Tests:  Recent Labs  08/01/14 1240  TSH 3.119   Anemia Panel: No results for input(s): VITAMINB12, FOLATE, FERRITIN, TIBC, IRON, RETICCTPCT in the last 72 hours. Coag Panel:   Lab Results  Component Value Date   INR 1.42 07/28/2014   INR 1.20 07/28/2014   INR 1.02 07/19/2014    RADIOLOGY: Dg Chest 2 View  07/19/2014   CLINICAL DATA:  Preoperative exam prior left shoulder surgery, history of breast malignancy  EXAM: CHEST  2 VIEW  COMPARISON:  PA and lateral chest x-ray of October 07, 2012  FINDINGS: The lungs are hyperinflated with hemidiaphragm flattening. There is no focal infiltrate. There is no pleural effusion. The heart and pulmonary vascularity are normal. There is calcification in the wall of the aortic arch. There is mild tortuosity of the descending thoracic aorta. There is a moderate-sized hiatal hernia. There is degenerative disc disease at multiple thoracic levels. There are degenerative changes of both shoulders.  IMPRESSION: COPD.  There is no active cardiopulmonary disease.   Electronically Signed   By: David  Martinique M.D.  On: 07/19/2014 15:31   Ct Head Wo Contrast  07/28/2014   CLINICAL DATA:  81 year old who underwent left shoulder arthroplasty yesterday, now with acute delirium.  EXAM: CT HEAD WITHOUT CONTRAST  TECHNIQUE: Contiguous axial images were obtained from the base of the skull through the vertex without intravenous contrast.  COMPARISON:  CT head 05/01/2014, 10/21/2010.  MRI brain 10/21/2010.  FINDINGS: Ventricular system normal in size and appearance for age. Mild to moderate cortical and deep atrophy and moderate cerebellar atrophy, progressive since 2012. Mild changes of small vessel disease of the white matter diffusely, also progressive. Physiologic calcifications in the basal ganglia, unchanged. No mass lesion. No midline shift. No acute  hemorrhage or hematoma. No extra-axial fluid collections. No evidence of acute infarction.  Mild changes of hyperostosis frontalis interna. Visualized paranasal sinuses, bilateral mastoid air cells and bilateral middle ear cavities well-aerated. Bilateral carotid siphon and vertebral artery atherosclerosis.  IMPRESSION: 1. No acute intracranial abnormality. 2. Mild to moderate generalized atrophy and mild chronic microvascular ischemic changes of the white matter.   Electronically Signed   By: Evangeline Dakin M.D.   On: 07/28/2014 22:37   Ct Angio Chest Pe W/cm &/or Wo Cm  07/28/2014   CLINICAL DATA:  Acute onset of hypoxia. Recent left shoulder surgery. Evaluate for pulmonary embolus. Initial encounter.  EXAM: CT ANGIOGRAPHY CHEST WITH CONTRAST  TECHNIQUE: Multidetector CT imaging of the chest was performed using the standard protocol during bolus administration of intravenous contrast. Multiplanar CT image reconstructions and MIPs were obtained to evaluate the vascular anatomy.  CONTRAST:  90mL OMNIPAQUE IOHEXOL 350 MG/ML SOLN  COMPARISON:  Chest radiograph performed earlier today at 3:08 p.m.  FINDINGS: There is no evidence of pulmonary embolus.  Trace bilateral pleural fluid is noted, with minimal associated atelectasis. There is no evidence of pneumothorax. No masses are identified; no abnormal focal contrast enhancement is seen.  A moderate hiatal hernia is noted, filled with fluid. The patient's endotracheal tube is seen ending 1-2 cm above the carina. Scattered coronary artery calcifications are seen. The mediastinum is otherwise unremarkable. No mediastinal lymphadenopathy is seen. No pericardial effusion is identified. The great vessels are grossly unremarkable in appearance. Scattered calcification is noted along the aortic arch and descending thoracic aorta.  Postoperative change is noted about the left shoulder, with scattered soft tissue air and diffuse soft tissue inflammation tracking along the  proximal left arm. No axillary lymphadenopathy is seen. The visualized portions of the thyroid gland are unremarkable in appearance.  The visualized portions of the liver and spleen are unremarkable. The patient is status post cholecystectomy, with clips noted along the gallbladder fossa. The visualized portions of the pancreas, adrenal glands and kidneys are grossly unremarkable. Scattered vascular calcifications are seen at the renal hila bilaterally.  No acute osseous abnormalities are seen. A left shoulder arthroplasty is grossly unremarkable in appearance, though incompletely imaged. Endplate sclerotic change is noted at T11-T12.  Review of the MIP images confirms the above findings.  IMPRESSION: 1. No evidence of pulmonary nodules. 2. Endotracheal tube seen ending 1-2 cm above the carina. This could be retracted 1-2 cm. 3. Trace bilateral pleural fluid, with minimal associated atelectasis. 4. Moderate hiatal hernia, filled with fluid. 5. Postoperative change about the left shoulder, with scattered soft tissue air and diffuse soft tissue inflammation tracking along the proximal left arm. The visualized portions of the patient's left shoulder arthroplasty appear grossly intact, without evidence of loosening. 6. Scattered calcification along the aortic arch and distal thoracic  aorta, and scattered vascular calcifications at the renal hila bilaterally.   Electronically Signed   By: Garald Balding M.D.   On: 07/28/2014 22:39   Ct Shoulder Left Wo Contrast  07/14/2014   CLINICAL DATA:  Chronic left shoulder pain for years. History of surgery in the 1980s and left breast cancer. pre-op for left shoulder replacement. Initial encounter.  EXAM: CT OF THE LEFT SHOULDER WITHOUT CONTRAST  TECHNIQUE: Multidetector CT imaging was performed according to the standard protocol. Multiplanar CT image reconstructions were also generated.  COMPARISON:  None.  FINDINGS: There are severe glenohumeral degenerative changes on the  left with marked joint space loss, osteophyte and subchondral cyst formation in the humeral head and glenoid. There is a large amount of complex fluid within the shoulder joint, superior subscapularis recess and coracoid bursa. There are multiple intra-articular loose bodies, the largest along the superior glenoid rim, measuring up to 18 mm in diameter.  The subacromial space is obliterated. There is erosion of the undersurface of the acromion. The acromioclavicular joint is widened consistent with previous distal clavicle resection. There is a large chronic rotator cuff tear with marked atrophy of the supraspinatus and infraspinous muscles.  Emphysematous changes are present within the left lung. No chest wall lesion identified. There is atherosclerosis of the aorta and coronary arteries.  IMPRESSION: 1. Advanced glenohumeral degenerative changes as described with multiple intra-articular loose bodies. 2. Obliterated subacromial space with large chronic rotator cuff tear involving the supraspinatus and infraspinous tendons.   Electronically Signed   By: Richardean Sale M.D.   On: 07/14/2014 15:44   Dg Chest Port 1 View  08/03/2014   CLINICAL DATA:  Pulmonary edema  EXAM: PORTABLE CHEST - 1 VIEW  COMPARISON:  07/31/2014  FINDINGS: Cardiac shadow is at the upper limits of normal in size but stable. Vascular congestion and pulmonary edema is noted although slightly improved when compared with the prior exam. Increasing early infiltrate is noted in the left lung base. A small left pleural effusion is noted as well. No acute bony abnormality is seen.  IMPRESSION: Vascular congestion and pulmonary edema although slightly improved when compare with the prior exam.  Increasing left basilar infiltrate.   Electronically Signed   By: Inez Catalina M.D.   On: 08/03/2014 07:28   Dg Chest Port 1 View  08/01/2014   CLINICAL DATA:  Respiratory failure.  Shortness of breath.  EXAM: PORTABLE CHEST - 1 VIEW  COMPARISON:   07/30/2014 at 0538 hour  FINDINGS: Endotracheal tube, enteric tube, and left central line have been removed. Lower lung volumes from prior exam. Cardiomegaly is unchanged allowing for differences in technique. Development of mixed interstitial alveolar perihilar opacities, right greater than left. Progressive blunting of costophrenic angle suspicious for pleural effusions. No pneumothorax. Osseous structures are unchanged.  IMPRESSION: Development perihilar opacities concerning for pulmonary edema and probable pleural effusions. Findings consistent with CHF. Aspiration or less likely pneumonia could have a similar appearance.   Electronically Signed   By: Jeb Levering M.D.   On: 08/01/2014 00:25   Dg Chest Port 1 View  07/30/2014   CLINICAL DATA:  Ventilator dependent respiratory failure. Three days postop left shoulder arthroplasty.  EXAM: PORTABLE CHEST - 1 VIEW  COMPARISON:  07/29/2014 and earlier, including CTA chest 07/28/2014.  FINDINGS: Endotracheal tube tip in satisfactory position approximately 4 cm above the carina. Left jugular central venous catheter tip projects over the upper SVC, unchanged. Nasogastric tube courses below the diaphragm into the stomach.  Cardiac silhouette mildly enlarged but stable. Mild atelectasis at the left lung base and small left pleural effusion, slightly improved since yesterday. Lungs otherwise clear. Left shoulder arthroplasty with anatomic alignment. Severe degenerative changes in right shoulder.  IMPRESSION: 1. Support apparatus satisfactory. 2. Mild atelectasis at the left lung base and small left pleural effusion, improved since yesterday. 3. No new abnormalities.   Electronically Signed   By: Evangeline Dakin M.D.   On: 07/30/2014 09:22   Dg Chest Port 1 View  07/29/2014   CLINICAL DATA:  Check endotracheal tube placement  EXAM: PORTABLE CHEST - 1 VIEW  COMPARISON:  07/28/2014  FINDINGS: Cardiac shadow is stable. A left central venous line is again noted and  stable. Endotracheal tube is been withdrawn and now lies approximately 5.5 cm above the carina. The lungs are well aerated bilaterally with mild interstitial changes. A small left pleural effusion is again seen. Postsurgical changes in left shoulder are seen.  IMPRESSION: Interval withdrawal of the endotracheal tube. No new focal abnormality is noted.   Electronically Signed   By: Inez Catalina M.D.   On: 07/29/2014 08:14   Dg Chest Port 1 View  07/28/2014   CLINICAL DATA:  Central line placement.  EXAM: PORTABLE CHEST - 1 VIEW  COMPARISON:  Chest x-ray dated earlier same day.  FINDINGS: There has been interval placement of a left-sided central line with tip in the upper superior vena cava. Endotracheal tube has also been placed with tip slightly into the right mainstem bronchus. Cardiomediastinal silhouette is stable in size and configuration.  Suspect trace left pleural effusion. Lungs otherwise clear. No pneumothorax seen. The left shoulder replacement hardware is stable in position. No acute osseous abnormality seen.  A hiatal hernia is better appreciated on the earlier chest x-ray. Again noted is gaseous distention of the stomach.  IMPRESSION: 1. Endotracheal tube tip projects slightly into the right mainstem bronchus. Recommend retracting approximately 2 cm for optimal radiographic positioning. 2. Left-sided internal jugular central line in place with tip at the upper margin of the SVC. 3. No pneumothorax. 4. Suspect trace left pleural effusion versus mild left basilar atelectasis. Lungs otherwise clear.  These results will be called to the ordering clinician or representative by the Radiologist Assistant, and communication documented in the PACS or zVision Dashboard.   Electronically Signed   By: Franki Cabot M.D.   On: 07/28/2014 15:25   Dg Chest Port 1 View  07/28/2014   CLINICAL DATA:  Tachycardia.  Postop left shoulder replacement.  EXAM: PORTABLE CHEST - 1 VIEW  COMPARISON:  07/19/2014  FINDINGS:  The patient has undergone left shoulder replacement. The heart size is accentuated by technique. Probable left-sided pleural effusion noted. Large hiatal hernia noted. Right lung is clear. No pulmonary edema. Surgical clips overlie the left axillary region. There is gaseous distension of the stomach.  IMPRESSION: 1. Suspect left pleural effusion. 2. Hiatal hernia.  Gaseous distension of the stomach.   Electronically Signed   By: Nolon Nations M.D.   On: 07/28/2014 09:02   Dg Shoulder Left Port  07/28/2014   CLINICAL DATA:  Pain  EXAM: LEFT SHOULDER - 1 VIEW  COMPARISON:  July 27, 2014  FINDINGS: Frontal view obtained. There is a total shoulder replacement prosthesis on the left with alignment anatomic on this single view. No fracture or dislocation apparent. There is evidence of old trauma involving the lateral left clavicle with widening between the clavicle and acromion, stable. No coracoclavicular separation seen on  this study.  IMPRESSION: No change in total shoulder prosthesis alignment on frontal view. No acute fracture or dislocation. Stable widening between the lateral left clavicle and acromion with evidence of old trauma involving the lateral left clavicle.   Electronically Signed   By: Lowella Grip III M.D.   On: 07/28/2014 15:19   Dg Shoulder Left Port  07/27/2014   CLINICAL DATA:  Postop  EXAM: LEFT SHOULDER - 1 VIEW  COMPARISON:  07/14/2014  FINDINGS: Left total shoulder arthroplasty is anatomically aligned. There is no breakage or loosening of the hardware. Gas in the soft tissues is consistent with recent surgery. Loose body above the acetabular component is stable.  IMPRESSION: Left total shoulder arthroplasty anatomically aligned.   Electronically Signed   By: Marybelle Killings M.D.   On: 07/27/2014 10:04   Dg Abd Portable 1v  07/29/2014   CLINICAL DATA:  81 year old female status post enteric tube placement. Check position.  EXAM: PORTABLE ABDOMEN - 1 VIEW  COMPARISON:  Earlier  Radiograph dated 07/28/2014  FINDINGS: There has been interval placement of an enteric tube with tip in the right hemi abdomen likely in the distal stomach. There is no evidence of bowel obstruction. No free air. Moderate stool noted throughout the colon. Excreted contrast noted within the renal collecting systems bilaterally. Cholecystectomy clips. L4-L5 disc spacer and posterior fixation hardware. Degenerative changes of the spine.  IMPRESSION: Enteric tube with tip in the right abdomen likely in the distal stomach. No evidence of bowel obstruction.   Electronically Signed   By: Anner Crete M.D.   On: 07/29/2014 01:11   Dg Abd Portable 1v  07/28/2014   CLINICAL DATA:  Nasogastric tube placement  EXAM: PORTABLE ABDOMEN - 1 VIEW  COMPARISON:  Portable exam 1652 hours compared to 08/31/2012 lumbar spine radiographs  FINDINGS: Tip of endotracheal tube projects 2.7 cm above carina.  Tip of nasogastric tube is in the mid thorax ; recommend advancing tube 18 cm to place proximal side-port within the proximal stomach.  LEFT jugular central venous catheter tip projecting over SVC.  Normal heart size with calcification and tortuosity of thoracic aorta.  Lungs appear emphysematous without gross infiltrate or pleural effusion.  Minimal atelectasis LEFT base.  Prominent bowel gas and stool in colon in the upper abdomen.  Gaseous distention of stomach.  Prior lumbosacral fusion.  Bones demineralized.  IMPRESSION: Tip of nasogastric tube is in the mid thoracic esophagus; recommend advancing tube 18 cm to place proximal side-port within the proximal stomach.  Findings called to [place] [facility] [place] on 07/28/2014 at 1706 hours.   Electronically Signed   By: Lavonia Dana M.D.   On: 07/28/2014 17:07   Ct Extrem Up Entire Arm L Wo/cm  07/28/2014   CLINICAL DATA:  81 year old female with left shoulder reverse arthroplasty. Evaluate for hematoma  EXAM: CT OF THE UPPER LEFT ARM WITHOUT CONTRAST  TECHNIQUE: Multidetector CT  imaging was performed according to the standard protocol. Multiplanar CT image reconstructions were also generated.  COMPARISON:  Radiograph dated 07/28/2014 and CT dated 07/14/2014  FINDINGS: Evaluation very limited due to streak artifact caused by metallic arthroplasty.  There is a total left shoulder reverse arthroplasty. There is no evidence of dislocation. There is extensive osteoarthritic changes of the proximal humerus. No acute fracture. Multiple small bone fragments superior to the arthroplasty are similar to prior study and may represent ectopic bone formation or loose bodies. There is diffuse soft tissue edema with small fluid surrounding the joint.  No evidence of large hematoma. There is postsurgical changes of the soft tissues in the anterior upper extremity with small pockets of soft tissue air. There is diffuse subcutaneous soft tissue stranding.  A small left pleural effusion may be present.  IMPRESSION: Postsurgical changes of left shoulder reverse arthroplasty. No significant fluid collection or hematoma identified.   Electronically Signed   By: Anner Crete M.D.   On: 07/28/2014 22:46      ASSESSMENT: Kathyrn Lass:  Cardiomyopathy: possibly stress induced.  Patient without any CP at this time.  Now only requiring 1 L of supplemental oxygen. Significant improvement over the last few days.  Mild cr bump again today. Would not cath.  Continue medical therapy for fluid overload as she tolerates. Given increase in creatinine this morning, will hold off on any Lasix. She appears euvolemic.  Troponin trending down.  OK to walk with PT.  Discussed findings with the family.  Continued heparin IV for 48 hours total. We'll now DC heparin. Start subcutaneous heparin for DVT prophylaxis.  Jettie Booze, MD  08/03/2014  9:00 AM

## 2014-08-03 NOTE — Care Management Important Message (Signed)
Important Message  Patient Details  Name: Andrea Gilmore MRN: [id number] Date of Birth: 04/18/1925   Medicare Important Message Given:  Yes-fourth notification given    Pricilla Handler 08/03/2014, 12:08 PM

## 2014-08-03 NOTE — Clinical Social Work Placement (Addendum)
   CLINICAL SOCIAL WORK PLACEMENT  NOTE  Date:  08/03/2014  Patient Details  Name: Andrea Gilmore MRN: [id number] Date of Birth: [birth year]  Clinical Social Work is seeking post-discharge placement for this patient at the [place] level of care (*CSW will initial, date and re-position this form in  chart as items are completed):  Yes   Patient/family provided with [facility] Work Department's list of facilities offering this level of care within the geographic area requested by the patient (or if unable, by the patient's family).  Yes   Patient/family informed of their freedom to choose among providers that offer the needed level of care, that participate in Medicare, Medicaid or managed care program needed by the patient, have an available bed and are willing to accept the patient.  Yes   Patient/family informed of [facility]'s ownership interest in [facility] and [facility], as well as of the fact that they are under no obligation to receive care at these facilities.  PASRR submitted to EDS on 08/03/14     PASRR number received on 08/03/14     Existing PASRR number confirmed on       FL2 transmitted to all facilities in geographic area requested by pt/family on 08/03/14     FL2 transmitted to all facilities within larger geographic area on       Patient informed that his/her managed care company has contracts with or will negotiate with certain facilities, including the following:        Yes   Patient/family informed of bed offers received.  Patient chooses bed at [facility]     Physician recommends and patient chooses bed at      Patient to be transferred to [facility] on  .  Patient to be transferred to facility by  daughter      Patient family notified on   7/30 of transfer.  Name of family member notified:   daughter at bedside and to transport   PHYSICIAN Please sign FL2, Please prepare priority discharge summary,  including medications, Please prepare prescriptions     Additional Comment:    Berton Mount, [place]

## 2014-08-03 NOTE — Progress Notes (Signed)
   PATIENT ID: Andrea Gilmore   7 Days Post-Op Procedure(s) (LRB): REVERSE SHOULDER ARTHROPLASTY (Left)  Subjective: Doing well this am. Reports she feels improvement with her breathing, denies SOB, but doesn't feel 100%. Hopes to get up and walking.   Objective:  Filed Vitals:   08/03/14 0813  BP:   Pulse:   Temp: 98.4 F (36.9 C)  Resp:     Alert and oriented. Left shoulder dressing with scant dry blood. Distally left hand neurovascularly intact  Labs:   Recent Labs  08/01/14 0043 08/02/14 0235 08/03/14 0251  HGB 10.0* 10.0* 9.2*   Recent Labs  08/02/14 0235 08/03/14 0251  WBC 22.0* 15.1*  RBC 3.29* 3.03*  HCT 29.8* 27.8*  PLT 274 289   Recent Labs  08/02/14 0235 08/03/14 0251  NA 140 138  K 5.4* 4.1  CL 111 106  CO2 16* 23  BUN 23* 30*  CREATININE 1.31* 1.42*  GLUCOSE 121* 78  CALCIUM 8.6* 8.6*    Assessment and Plan: Postoperative day 7 status post left reverse total shoulder replacement complicated by postoperative decline, recent elevated troponin with concern for cardiac ischemia, determined not to be a good candidate for cath orientated and alert Critical Care involved again, we appreciate their help Stepdown currently L UE in sling, okay for hand, wrist elbow ROM. Dressing will stay on until follow up visit with Dr. Tamera Punt.  Continue with PT D/c to SNF when medically stable per cardiology/critical care, has a bed a [facility] Follow up with Dr. Tamera Punt in 1 week  VTE proph: per primary team, heparin

## 2014-08-03 NOTE — Progress Notes (Signed)
Bipap not needed at this time. Pt vitals WNL. RT will continue to monitor.

## 2014-08-03 NOTE — Progress Notes (Signed)
PULMONARY / CRITICAL CARE MEDICINE   Name: Andrea Gilmore MRN: [id number] DOB: 1925/06/14    ADMISSION DATE:  07/27/2014 CONSULTATION DATE:  07/28/14  REFERRING MD :  Dr. Tamera Punt   CHIEF COMPLAINT:  Acute Blood Loss Anemia post-op reverse shoulder arthroplasty   INITIAL PRESENTATION: 81 y/o F with PMH of hypothyroidism, prior breast cancer and osteroarthritis who was admitted 7/21 for planned left shoulder reverse arthroplasty.  Post-operatively 7/22, the patient developed delirium, hallucinations, fever, and hypoxia.  She was transferred to SDU and worked up for sepsis by [facility].  She was later noted to have left arm swelling and erythema.  PCCM consulted for evaluation 7/22.   STUDIES:  7/22 CXR >> left pleural effusion, hiatal hernia, gaseous distention of the stomach, L shoulder replacement  SIGNIFICANT EVENTS: 7/21  Admit for planned L should reverse arthroplasty  7/22  Early am developed delirium overnight, fever, hallucinations, hypoxia.  Arm swelling, hgb drop from 10.8 to 7.6 concerning for post-op bleeding   HISTORY OF PRESENT ILLNESS:  81 y/o F with PMH of hypothyroidism, prior breast cancer and osteroarthritis who was admitted 7/21 for planned left shoulder reverse arthroplasty.   Post-operatively 7/22 in the early am, the patient developed delirium, hallucinations, fever, and hypoxia.  She was transferred to SDU and worked up for sepsis by [facility].  She was later noted to have left arm swelling and erythema.  As well as a hgb drop from 10.8 to 7.6.  CT of the arm was ordered per [facility] and is pending to further evaluate L arm swelling.  The patient was typed and screened for 2 units PRBC's.  PCCM consulted for evaluation 7/22 with decline in clinical status.   SUBJECTIVE: No events overnight, down to 1L [place].  VITAL SIGNS: Temp:  [97.9 F (36.6 C)-98.4 F (36.9 C)] 98.4 F (36.9 C) (07/28 0813) Pulse Rate:  [60-81] 68 (07/28 0800) Resp:  [16-26] 24 (07/28 0800) BP:  (90-138)/(45-116) 125/55 mmHg (07/28 0800) SpO2:  [95 %-100 %] 97 % (07/28 0800) Weight:  [50.7 kg (111 lb 12.4 oz)] 50.7 kg (111 lb 12.4 oz) (07/28 0213)   HEMODYNAMICS:     VENTILATOR SETTINGS:     INTAKE / OUTPUT:  Intake/Output Summary (Last 24 hours) at 08/03/14 1123 Last data filed at 08/03/14 0800  Gross per 24 hour  Intake 311.76 ml  Output   1200 ml  Net -888.24 ml   PHYSICAL EXAMINATION: General:  Elderly appearing female, no respiratory distress this AM Neuro:  Awake and interactive, following all commands. HEENT:  [place]/AT, PERRL, EOM-I and MMM. Cardiovascular:  RRR, Nl S1/S2, -M/R/G. Lungs:  Diffuse crackles. Abdomen:  Soft, NT, ND and +BS. Musculoskeletal:  -edema and -tenderness, erythema on the left shoulder and arm with significant enlargement. Skin:  As above, otherwise intact.  LABS:  CBC  Recent Labs Lab 08/01/14 0043 08/02/14 0235 08/03/14 0251  WBC 14.6* 22.0* 15.1*  HGB 10.0* 10.0* 9.2*  HCT 29.7* 29.8* 27.8*  PLT 282 274 289   Coag's  Recent Labs Lab 07/28/14 1055 07/28/14 1532  APTT 28 30  INR 1.20 1.42   BMET  Recent Labs Lab 08/01/14 0043 08/02/14 0235 08/03/14 0251  NA 142 140 138  K 3.6 5.4* 4.1  CL 111 111 106  CO2 19* 16* 23  BUN 14 23* 30*  CREATININE 1.02* 1.31* 1.42*  GLUCOSE 225* 121* 78   Electrolytes  Recent Labs Lab 07/31/14 0500 08/01/14 0043 08/02/14 0235 08/03/14 0251  CALCIUM 6.9*  8.1* 8.6* 8.6*  MG 1.8 1.7 1.6* 2.0  PHOS 2.2* 3.6  --  2.8   Sepsis Markers  Recent Labs Lab 07/28/14 1055 07/28/14 1534 07/29/14 0300 07/30/14 1100 07/31/14 0500 08/01/14 0043  LATICACIDVEN 2.1* 0.8 0.6  --   --   --   PROCALCITON 0.95  --   --  0.95 0.59 0.32   ABG  Recent Labs Lab 07/28/14 1549 07/29/14 0318 07/30/14 0408  PHART 7.334* 7.336* 7.349*  PCO2ART 42.2 37.0 35.0  PO2ART 411.0* 143* 177*   Liver Enzymes  Recent Labs Lab 07/28/14 1532  AST 70*  ALT 14  ALKPHOS 28*  BILITOT 0.9   ALBUMIN 3.0*   Cardiac Enzymes  Recent Labs Lab 08/01/14 0852 08/01/14 1556 08/01/14 2020  TROPONINI 2.38* 2.06* 1.48*   Glucose  Recent Labs Lab 07/30/14 1149 07/30/14 1938 07/31/14 0851 07/31/14 1157 07/31/14 1619 07/31/14 2144  GLUCAP 80 100* 94 78 77 79   Imaging I reviewed CXR myself, improving pulmonary edema.  ASSESSMENT / PLAN:  PULMONARY OETT 7/22 >> 7/25 A: Acute Hypoxic Respiratory Failure / Respiratory Distress- new O2 requirement 7/22 L Pleural Effusion  Recurrent respiratory failure due to acute pulmonary edema. P:   - IS per RT protocol. - Titrate O2 for sat of 88-92%. - Aspiration precautions. - D/C further diureses at this point. - May remove foley.  CARDIOVASCULAR CVL L IJ TLC 7/22>>> A:  Tachycardia - in setting of respiratory distress  P:  - Continue close monitoring. - Cardiology following. - EKG as ordered. - Additional diureses ordered. - DNR, no cardioversion or CPR, short term intubation only. - D/C Heparin.  RENAL A:   Metabolic Acidosis - lactic acidosis +/- component AKI AKI  P:   - Trend BMP / UOP. - Replace electrolytes as indicated. - KVO IVF. - Hold further lasix.  GASTROINTESTINAL A:   Protein calorie malnutrition. P:   - Heart healthy diet.  HEMATOLOGIC A:   Acute Blood Loss Anemia - suspected L arm hemorrhage post surgery, r/o other etiology Hx Breast Cancer P:  - T&S. - Transfuse per ICU protocol.  INFECTIOUS A:   Fever, concern for aspiration PNA vs joint infection. P:   BCx2 7/22>>>NTD UC 7/22>>>pseudomonas pan sensitive Sputum 7/22>>>NTD  Vancomycin 7/22>>>7/25 Zosyn 7/22>>>7/25 Cipro 7/25>>> Sepsis protocol complete. Procalcitonin 0.59. D/C vancomycin and zosyn. D/C cipro.  ENDOCRINE A:   Hyperglycemia   Hypothyroidism  P:   - ISS - CBGs   NEUROLOGIC A:    Acute Encephalopathy - in setting of delirium  P:   RASS goal: 0 D/C versed/fentanyl. PRN percocet for pain, hold  while in respiratory failure however.  Ortho: significant erythema and enlargement of the left arm, below surgical site.  Ortho evaluated, appreciate input.  PCCM will sign off, please call back if needed.  Rush Farmer, M.D. [facility] Pulmonary/Critical Care Medicine. Pager: [phone]. After hours pager: [phone].

## 2014-08-04 DIAGNOSIS — D509 Iron deficiency anemia, unspecified: Secondary | ICD-10-CM | POA: Insufficient documentation

## 2014-08-04 DIAGNOSIS — I429 Cardiomyopathy, unspecified: Secondary | ICD-10-CM | POA: Insufficient documentation

## 2014-08-04 LAB — MAGNESIUM: MAGNESIUM: 1.9 mg/dL (ref 1.7–2.4)

## 2014-08-04 LAB — CBC
HCT: 29.1 % — ABNORMAL LOW (ref 36.0–46.0)
Hemoglobin: 9.5 g/dL — ABNORMAL LOW (ref 12.0–15.0)
MCH: 29.4 pg (ref 26.0–34.0)
MCHC: 32.6 g/dL (ref 30.0–36.0)
MCV: 90.1 fL (ref 78.0–100.0)
Platelets: 323 10*3/uL (ref 150–400)
RBC: 3.23 MIL/uL — ABNORMAL LOW (ref 3.87–5.11)
RDW: 15.5 % (ref 11.5–15.5)
WBC: 11.3 10*3/uL — ABNORMAL HIGH (ref 4.0–10.5)

## 2014-08-04 LAB — BASIC METABOLIC PANEL
ANION GAP: 8 (ref 5–15)
BUN: 29 mg/dL — ABNORMAL HIGH (ref 6–20)
CHLORIDE: 106 mmol/L (ref 101–111)
CO2: 24 mmol/L (ref 22–32)
Calcium: 8.2 mg/dL — ABNORMAL LOW (ref 8.9–10.3)
Creatinine, Ser: 1.28 mg/dL — ABNORMAL HIGH (ref 0.44–1.00)
GFR, EST AFRICAN AMERICAN: 42 mL/min — AB (ref >60)
GFR, EST NON AFRICAN AMERICAN: 36 mL/min — AB (ref >60)
GLUCOSE: 87 mg/dL (ref 65–99)
Potassium: 4 mmol/L (ref 3.5–5.1)
Sodium: 138 mmol/L (ref 135–145)

## 2014-08-04 MED ORDER — HEPARIN SODIUM (PORCINE) 5000 UNIT/ML IJ SOLN
5000.0000 [IU] | Freq: Three times a day (TID) | INTRAMUSCULAR | Status: DC
  Administered 2014-08-04 – 2014-08-05 (×3): 5000 [IU] via SUBCUTANEOUS
  Filled 2014-08-04 (×4): qty 1

## 2014-08-04 MED ORDER — ENSURE ENLIVE PO LIQD
237.0000 mL | Freq: Two times a day (BID) | ORAL | Status: DC
  Administered 2014-08-04 – 2014-08-05 (×2): 237 mL via ORAL

## 2014-08-04 MED ORDER — METOPROLOL TARTRATE 12.5 MG HALF TABLET
12.5000 mg | ORAL_TABLET | Freq: Two times a day (BID) | ORAL | Status: DC
  Administered 2014-08-04 – 2014-08-05 (×3): 12.5 mg via ORAL
  Filled 2014-08-04 (×4): qty 1

## 2014-08-04 NOTE — Progress Notes (Signed)
   PATIENT ID: Andrea Gilmore   8 Days Post-Op Procedure(s) (LRB): REVERSE SHOULDER ARTHROPLASTY (Left)  Subjective: Reports doing well. Motivated to get up and walking but feels weak. Tells me she feels like she is breathing better.   Objective:  Filed Vitals:   08/04/14 0600  BP: 123/41  Pulse: 71  Temp:   Resp: 18     Alert and oriented. Left shoulder dressing with scant dry blood. Distally left hand neurovascularly intact  Labs:   Recent Labs  08/02/14 0235 08/03/14 0251 08/04/14 0259  HGB 10.0* 9.2* 9.5*   Recent Labs  08/03/14 0251 08/04/14 0259  WBC 15.1* 11.3*  RBC 3.03* 3.23*  HCT 27.8* 29.1*  PLT 289 323   Recent Labs  08/03/14 0251 08/04/14 0259  NA 138 138  K 4.1 4.0  CL 106 106  CO2 23 24  BUN 30* 29*  CREATININE 1.42* 1.28*  GLUCOSE 78 87  CALCIUM 8.6* 8.2*    Assessment and Plan: Postoperative day 8 status post left reverse total shoulder replacement complicated by postoperative decline, now off BiPAP, vitals stable orientated and alert Critical Care involved again, we appreciate their help Stepdown currently-could transfer to 5N if stable per critical care/cardiology L UE in sling, okay for hand, wrist elbow ROM. Dressing will stay on until follow up visit with Dr. Tamera Punt.  Continue with PT D/c to SNF when medically stable per cardiology/critical care, has a bed a [facility] Follow up with Dr. Tamera Punt in 1 week  VTE proph: per primary team, heparin

## 2014-08-04 NOTE — Discharge Summary (Signed)
Patient ID: KENDEL PESNELL MRN: [id number] DOB/AGE: [birth year] 81 y.o.  Admit date: 07/27/2014 Discharge date: 08/05/2014  Admission Diagnoses:  Principal Problem:   Sepsis Active Problems:   History of breast cancer   History of iron deficiency anemia   Left rotator cuff tear arthropathy   CKD (chronic kidney disease), stage III   Metabolic encephalopathy   Acute delirium   Fever   Acute respiratory failure with hypoxia   Metabolic acidosis   Protein-calorie malnutrition, severe   Acute systolic heart failure   Respiratory failure   NSTEMI (non-ST elevated myocardial infarction)   Protein-calorie malnutrition   Other specified hypothyroidism   Acute pulmonary edema   Pleural effusion, left   Hypoxia   Blood poisoning   Aspiration pneumonia due to food (regurgitated)   Chronic kidney disease, stage III (moderate)   UTI (lower urinary tract infection)   Anemia, iron deficiency   Acute blood loss anemia   Acute encephalopathy   Aspiration pneumonia due to inhalation of milk   Secondary cardiomyopathy   Acute cystitis without hematuria   Discharge Diagnoses:  Same  Past Medical History  Diagnosis Date  . Arthritis   . Thyroid disease   . Cancer     breast cancer    Surgeries: Procedure(s): REVERSE SHOULDER ARTHROPLASTY on 07/27/2014   Consultants: Treatment Team:  Ripudeep Krystal Eaton, MD [facility], MD  Discharged Condition: Improved  Hospital Course: Andrea Gilmore is an 81 y.o. female who was admitted 07/27/2014 for operative treatment of left shoulder rotator cuff arthropathy. Patient has severe unremitting pain that affects sleep, daily activities, and work/hobbies. After pre-op clearance the patient was taken to the operating room on 07/27/2014 and underwent  Procedure(s): [place].    Patient was given perioperative antibiotics: Anti-infectives    Start     Dose/Rate Route Frequency Ordered Stop   08/01/14 1400   ciprofloxacin (CIPRO) tablet 250 mg  Status:  Discontinued     250 mg Oral Daily with breakfast 07/31/14 1159 07/31/14 1200   07/31/14 1400  ciprofloxacin (CIPRO) tablet 250 mg     250 mg Oral Daily with breakfast 07/31/14 1200 08/03/14 0800   07/29/14 1100  vancomycin (VANCOCIN) 500 mg in sodium chloride 0.9 % 100 mL IVPB  Status:  Discontinued     500 mg 100 mL/hr over 60 Minutes Intravenous Every 24 hours 07/28/14 1028 07/31/14 1159   07/28/14 1600  piperacillin-tazobactam (ZOSYN) IVPB 2.25 g  Status:  Discontinued     2.25 g 100 mL/hr over 30 Minutes Intravenous 3 times per day 07/28/14 1028 07/31/14 1159   07/28/14 1100  vancomycin (VANCOCIN) IVPB 750 mg/150 ml premix     750 mg 150 mL/hr over 60 Minutes Intravenous  Once 07/28/14 1028 07/28/14 1544   07/28/14 1015  piperacillin-tazobactam (ZOSYN) IVPB 3.375 g     3.375 g 100 mL/hr over 30 Minutes Intravenous  Once 07/28/14 1010 07/28/14 1225   07/28/14 1015  vancomycin (VANCOCIN) IVPB 1000 mg/200 mL premix  Status:  Discontinued     1,000 mg 200 mL/hr over 60 Minutes Intravenous  Once 07/28/14 1010 07/28/14 1023   07/27/14 1300  ceFAZolin (ANCEF) IVPB 1 g/50 mL premix  Status:  Discontinued     1 g 100 mL/hr over 30 Minutes Intravenous Every 6 hours 07/27/14 1021 07/28/14 1038   07/27/14 0700  ceFAZolin (ANCEF) IVPB 2 g/50 mL premix     2 g 100 mL/hr over 30 Minutes Intravenous To [employer]  Surgical 07/26/14 1253 07/27/14 0745      Post operatively patient's hospital stay was complication by a decline in status including delerium, hallucinations, fever and hypoxia. Critical care and cardiology consulted and transferred the SDU. Patient was treated for sepsis, acute respiratory failure with hypoxia, pulmonary edema/effusion, ACS vs NSTEMI with heparin (not a cath candidate), UTI, CKD, IDA. Mental status improved to baseline, vitals improved over the week and able to ween off O2 with good sats on 7/29, determined okay for d/c to SNF  per critical care and cardiology 08/05/14.  Patient was given sequential compression devices, early ambulation, and heparin to prevent DVT.   Recent vital signs: Patient Vitals for the past 24 hrs:  BP Temp Temp src Pulse Resp SpO2 Weight  08/04/14 1248 (!) 141/56 mmHg - - 80 - - -  08/04/14 1226 - 98.1 F (36.7 C) Oral - - - -  08/04/14 0800 (!) 121/37 mmHg - - (!) 40 19 99 % -  08/04/14 0600 (!) 123/41 mmHg - - 71 18 100 % -  08/04/14 0400 (!) 125/42 mmHg - - (!) 52 20 100 % -  08/04/14 0349 - 97.7 F (36.5 C) Oral - - - -  08/04/14 0300 - - - - - - 50.3 kg (110 lb 14.3 oz)  08/04/14 0200 (!) 113/44 mmHg - - (!) 121 18 99 % -  08/04/14 0000 (!) 113/44 mmHg - - 61 13 99 % -  08/03/14 2322 - 97.8 F (36.6 C) Oral - - - -  08/03/14 2200 (!) 108/52 mmHg - - 64 16 100 % -  08/03/14 2000 128/62 mmHg - - 76 20 100 % -  08/03/14 1951 - 97.7 F (36.5 C) Oral - - - -  08/03/14 1800 (!) 109/53 mmHg - - 64 17 99 % -  08/03/14 1624 - 97.4 F (36.3 C) Oral - - - -     Recent laboratory studies:  Recent Labs  08/03/14 0251 08/04/14 0259  WBC 15.1* 11.3*  HGB 9.2* 9.5*  HCT 27.8* 29.1*  PLT 289 323  NA 138 138  K 4.1 4.0  CL 106 106  CO2 23 24  BUN 30* 29*  CREATININE 1.42* 1.28*  GLUCOSE 78 87  CALCIUM 8.6* 8.2*     Discharge Medications:     Medication List    ASK your doctor about these medications        aspirin 325 MG buffered tablet  Take 325 mg by mouth daily.     bimatoprost 0.01 % Soln  Commonly known as:  LUMIGAN  Place 1 drop into both eyes every other day.     CALCIUM + D PO  Take 1 tablet by mouth daily.     diazepam 5 MG tablet  Commonly known as:  VALIUM  Take 5 mg by mouth every 6 (six) hours as needed for anxiety.     diclofenac sodium 1 % Gel  Commonly known as:  VOLTAREN  Apply 1 application topically 4 (four) times daily as needed.     fish oil-omega-3 fatty acids 1000 MG capsule  Take 1 g by mouth daily.     GLUCOSAMINE CHONDROITIN  JOINT Tabs  Take 1 tablet by mouth daily.     HYDROcodone-acetaminophen 5-325 MG per tablet  Commonly known as:  NORCO/VICODIN  Take 0.5-1 tablets by mouth every 6 (six) hours as needed for moderate pain.     levothyroxine 50 MCG tablet  Commonly known as:  SYNTHROID, LEVOTHROID  Take 1 tablet by mouth daily.     multivitamin with minerals tablet  Take 1 tablet by mouth daily.     OVER THE COUNTER MEDICATION  Take 1 tablet by mouth daily. Brain & Liver Vitamin     PAIN RELIEVING RUB EX  Apply 1 application topically as needed (pain).        Diagnostic Studies: Dg Chest 2 View  07/19/2014   CLINICAL DATA:  Preoperative exam prior left shoulder surgery, history of breast malignancy  EXAM: CHEST  2 VIEW  COMPARISON:  PA and lateral chest x-ray of October 07, 2012  FINDINGS: The lungs are hyperinflated with hemidiaphragm flattening. There is no focal infiltrate. There is no pleural effusion. The heart and pulmonary vascularity are normal. There is calcification in the wall of the aortic arch. There is mild tortuosity of the descending thoracic aorta. There is a moderate-sized hiatal hernia. There is degenerative disc disease at multiple thoracic levels. There are degenerative changes of both shoulders.  IMPRESSION: COPD.  There is no active cardiopulmonary disease.   Electronically Signed   By: David  Martinique M.D.   On: 07/19/2014 15:31   Ct Head Wo Contrast  07/28/2014   CLINICAL DATA:  81 year old who underwent left shoulder arthroplasty yesterday, now with acute delirium.  EXAM: CT HEAD WITHOUT CONTRAST  TECHNIQUE: Contiguous axial images were obtained from the base of the skull through the vertex without intravenous contrast.  COMPARISON:  CT head 05/01/2014, 10/21/2010.  MRI brain 10/21/2010.  FINDINGS: Ventricular system normal in size and appearance for age. Mild to moderate cortical and deep atrophy and moderate cerebellar atrophy, progressive since 2012. Mild changes of small vessel  disease of the white matter diffusely, also progressive. Physiologic calcifications in the basal ganglia, unchanged. No mass lesion. No midline shift. No acute hemorrhage or hematoma. No extra-axial fluid collections. No evidence of acute infarction.  Mild changes of hyperostosis frontalis interna. Visualized paranasal sinuses, bilateral mastoid air cells and bilateral middle ear cavities well-aerated. Bilateral carotid siphon and vertebral artery atherosclerosis.  IMPRESSION: 1. No acute intracranial abnormality. 2. Mild to moderate generalized atrophy and mild chronic microvascular ischemic changes of the white matter.   Electronically Signed   By: Evangeline Dakin M.D.   On: 07/28/2014 22:37   Ct Angio Chest Pe W/cm &/or Wo Cm  07/28/2014   CLINICAL DATA:  Acute onset of hypoxia. Recent left shoulder surgery. Evaluate for pulmonary embolus. Initial encounter.  EXAM: CT ANGIOGRAPHY CHEST WITH CONTRAST  TECHNIQUE: Multidetector CT imaging of the chest was performed using the standard protocol during bolus administration of intravenous contrast. Multiplanar CT image reconstructions and MIPs were obtained to evaluate the vascular anatomy.  CONTRAST:  77mL OMNIPAQUE IOHEXOL 350 MG/ML SOLN  COMPARISON:  Chest radiograph performed earlier today at 3:08 p.m.  FINDINGS: There is no evidence of pulmonary embolus.  Trace bilateral pleural fluid is noted, with minimal associated atelectasis. There is no evidence of pneumothorax. No masses are identified; no abnormal focal contrast enhancement is seen.  A moderate hiatal hernia is noted, filled with fluid. The patient's endotracheal tube is seen ending 1-2 cm above the carina. Scattered coronary artery calcifications are seen. The mediastinum is otherwise unremarkable. No mediastinal lymphadenopathy is seen. No pericardial effusion is identified. The great vessels are grossly unremarkable in appearance. Scattered calcification is noted along the aortic arch and descending  thoracic aorta.  Postoperative change is noted about the left shoulder, with scattered soft tissue air and diffuse  soft tissue inflammation tracking along the proximal left arm. No axillary lymphadenopathy is seen. The visualized portions of the thyroid gland are unremarkable in appearance.  The visualized portions of the liver and spleen are unremarkable. The patient is status post cholecystectomy, with clips noted along the gallbladder fossa. The visualized portions of the pancreas, adrenal glands and kidneys are grossly unremarkable. Scattered vascular calcifications are seen at the renal hila bilaterally.  No acute osseous abnormalities are seen. A left shoulder arthroplasty is grossly unremarkable in appearance, though incompletely imaged. Endplate sclerotic change is noted at T11-T12.  Review of the MIP images confirms the above findings.  IMPRESSION: 1. No evidence of pulmonary nodules. 2. Endotracheal tube seen ending 1-2 cm above the carina. This could be retracted 1-2 cm. 3. Trace bilateral pleural fluid, with minimal associated atelectasis. 4. Moderate hiatal hernia, filled with fluid. 5. Postoperative change about the left shoulder, with scattered soft tissue air and diffuse soft tissue inflammation tracking along the proximal left arm. The visualized portions of the patient's left shoulder arthroplasty appear grossly intact, without evidence of loosening. 6. Scattered calcification along the aortic arch and distal thoracic aorta, and scattered vascular calcifications at the renal hila bilaterally.   Electronically Signed   By: Garald Balding M.D.   On: 07/28/2014 22:39   Ct Shoulder Left Wo Contrast  07/14/2014   CLINICAL DATA:  Chronic left shoulder pain for years. History of surgery in the 1980s and left breast cancer. pre-op for left shoulder replacement. Initial encounter.  EXAM: CT OF THE LEFT SHOULDER WITHOUT CONTRAST  TECHNIQUE: Multidetector CT imaging was performed according to the standard  protocol. Multiplanar CT image reconstructions were also generated.  COMPARISON:  None.  FINDINGS: There are severe glenohumeral degenerative changes on the left with marked joint space loss, osteophyte and subchondral cyst formation in the humeral head and glenoid. There is a large amount of complex fluid within the shoulder joint, superior subscapularis recess and coracoid bursa. There are multiple intra-articular loose bodies, the largest along the superior glenoid rim, measuring up to 18 mm in diameter.  The subacromial space is obliterated. There is erosion of the undersurface of the acromion. The acromioclavicular joint is widened consistent with previous distal clavicle resection. There is a large chronic rotator cuff tear with marked atrophy of the supraspinatus and infraspinous muscles.  Emphysematous changes are present within the left lung. No chest wall lesion identified. There is atherosclerosis of the aorta and coronary arteries.  IMPRESSION: 1. Advanced glenohumeral degenerative changes as described with multiple intra-articular loose bodies. 2. Obliterated subacromial space with large chronic rotator cuff tear involving the supraspinatus and infraspinous tendons.   Electronically Signed   By: Richardean Sale M.D.   On: 07/14/2014 15:44   Dg Chest Port 1 View  08/03/2014   CLINICAL DATA:  Pulmonary edema  EXAM: PORTABLE CHEST - 1 VIEW  COMPARISON:  07/31/2014  FINDINGS: Cardiac shadow is at the upper limits of normal in size but stable. Vascular congestion and pulmonary edema is noted although slightly improved when compared with the prior exam. Increasing early infiltrate is noted in the left lung base. A small left pleural effusion is noted as well. No acute bony abnormality is seen.  IMPRESSION: Vascular congestion and pulmonary edema although slightly improved when compare with the prior exam.  Increasing left basilar infiltrate.   Electronically Signed   By: Inez Catalina M.D.   On: 08/03/2014  07:28   Dg Chest Port 1 View  08/01/2014  CLINICAL DATA:  Respiratory failure.  Shortness of breath.  EXAM: PORTABLE CHEST - 1 VIEW  COMPARISON:  07/30/2014 at 0538 hour  FINDINGS: Endotracheal tube, enteric tube, and left central line have been removed. Lower lung volumes from prior exam. Cardiomegaly is unchanged allowing for differences in technique. Development of mixed interstitial alveolar perihilar opacities, right greater than left. Progressive blunting of costophrenic angle suspicious for pleural effusions. No pneumothorax. Osseous structures are unchanged.  IMPRESSION: Development perihilar opacities concerning for pulmonary edema and probable pleural effusions. Findings consistent with CHF. Aspiration or less likely pneumonia could have a similar appearance.   Electronically Signed   By: Jeb Levering M.D.   On: 08/01/2014 00:25   Dg Chest Port 1 View  07/30/2014   CLINICAL DATA:  Ventilator dependent respiratory failure. Three days postop left shoulder arthroplasty.  EXAM: PORTABLE CHEST - 1 VIEW  COMPARISON:  07/29/2014 and earlier, including CTA chest 07/28/2014.  FINDINGS: Endotracheal tube tip in satisfactory position approximately 4 cm above the carina. Left jugular central venous catheter tip projects over the upper SVC, unchanged. Nasogastric tube courses below the diaphragm into the stomach. Cardiac silhouette mildly enlarged but stable. Mild atelectasis at the left lung base and small left pleural effusion, slightly improved since yesterday. Lungs otherwise clear. Left shoulder arthroplasty with anatomic alignment. Severe degenerative changes in right shoulder.  IMPRESSION: 1. Support apparatus satisfactory. 2. Mild atelectasis at the left lung base and small left pleural effusion, improved since yesterday. 3. No new abnormalities.   Electronically Signed   By: Evangeline Dakin M.D.   On: 07/30/2014 09:22   Dg Chest Port 1 View  07/29/2014   CLINICAL DATA:  Check endotracheal tube  placement  EXAM: PORTABLE CHEST - 1 VIEW  COMPARISON:  07/28/2014  FINDINGS: Cardiac shadow is stable. A left central venous line is again noted and stable. Endotracheal tube is been withdrawn and now lies approximately 5.5 cm above the carina. The lungs are well aerated bilaterally with mild interstitial changes. A small left pleural effusion is again seen. Postsurgical changes in left shoulder are seen.  IMPRESSION: Interval withdrawal of the endotracheal tube. No new focal abnormality is noted.   Electronically Signed   By: Inez Catalina M.D.   On: 07/29/2014 08:14   Dg Chest Port 1 View  07/28/2014   CLINICAL DATA:  Central line placement.  EXAM: PORTABLE CHEST - 1 VIEW  COMPARISON:  Chest x-ray dated earlier same day.  FINDINGS: There has been interval placement of a left-sided central line with tip in the upper superior vena cava. Endotracheal tube has also been placed with tip slightly into the right mainstem bronchus. Cardiomediastinal silhouette is stable in size and configuration.  Suspect trace left pleural effusion. Lungs otherwise clear. No pneumothorax seen. The left shoulder replacement hardware is stable in position. No acute osseous abnormality seen.  A hiatal hernia is better appreciated on the earlier chest x-ray. Again noted is gaseous distention of the stomach.  IMPRESSION: 1. Endotracheal tube tip projects slightly into the right mainstem bronchus. Recommend retracting approximately 2 cm for optimal radiographic positioning. 2. Left-sided internal jugular central line in place with tip at the upper margin of the SVC. 3. No pneumothorax. 4. Suspect trace left pleural effusion versus mild left basilar atelectasis. Lungs otherwise clear.  These results will be called to the ordering clinician or representative by the Radiologist Assistant, and communication documented in the PACS or zVision Dashboard.   Electronically Signed   By: Cherlynn Kaiser  Enriqueta Shutter M.D.   On: 07/28/2014 15:25   Dg Chest Port 1  View  07/28/2014   CLINICAL DATA:  Tachycardia.  Postop left shoulder replacement.  EXAM: PORTABLE CHEST - 1 VIEW  COMPARISON:  07/19/2014  FINDINGS: The patient has undergone left shoulder replacement. The heart size is accentuated by technique. Probable left-sided pleural effusion noted. Large hiatal hernia noted. Right lung is clear. No pulmonary edema. Surgical clips overlie the left axillary region. There is gaseous distension of the stomach.  IMPRESSION: 1. Suspect left pleural effusion. 2. Hiatal hernia.  Gaseous distension of the stomach.   Electronically Signed   By: Nolon Nations M.D.   On: 07/28/2014 09:02   Dg Shoulder Left Port  07/28/2014   CLINICAL DATA:  Pain  EXAM: LEFT SHOULDER - 1 VIEW  COMPARISON:  July 27, 2014  FINDINGS: Frontal view obtained. There is a total shoulder replacement prosthesis on the left with alignment anatomic on this single view. No fracture or dislocation apparent. There is evidence of old trauma involving the lateral left clavicle with widening between the clavicle and acromion, stable. No coracoclavicular separation seen on this study.  IMPRESSION: No change in total shoulder prosthesis alignment on frontal view. No acute fracture or dislocation. Stable widening between the lateral left clavicle and acromion with evidence of old trauma involving the lateral left clavicle.   Electronically Signed   By: Lowella Grip III M.D.   On: 07/28/2014 15:19   Dg Shoulder Left Port  07/27/2014   CLINICAL DATA:  Postop  EXAM: LEFT SHOULDER - 1 VIEW  COMPARISON:  07/14/2014  FINDINGS: Left total shoulder arthroplasty is anatomically aligned. There is no breakage or loosening of the hardware. Gas in the soft tissues is consistent with recent surgery. Loose body above the acetabular component is stable.  IMPRESSION: Left total shoulder arthroplasty anatomically aligned.   Electronically Signed   By: Marybelle Killings M.D.   On: 07/27/2014 10:04   Dg Abd Portable 1v  07/29/2014    CLINICAL DATA:  81 year old female status post enteric tube placement. Check position.  EXAM: PORTABLE ABDOMEN - 1 VIEW  COMPARISON:  Earlier Radiograph dated 07/28/2014  FINDINGS: There has been interval placement of an enteric tube with tip in the right hemi abdomen likely in the distal stomach. There is no evidence of bowel obstruction. No free air. Moderate stool noted throughout the colon. Excreted contrast noted within the renal collecting systems bilaterally. Cholecystectomy clips. L4-L5 disc spacer and posterior fixation hardware. Degenerative changes of the spine.  IMPRESSION: Enteric tube with tip in the right abdomen likely in the distal stomach. No evidence of bowel obstruction.   Electronically Signed   By: Anner Crete M.D.   On: 07/29/2014 01:11   Dg Abd Portable 1v  07/28/2014   CLINICAL DATA:  Nasogastric tube placement  EXAM: PORTABLE ABDOMEN - 1 VIEW  COMPARISON:  Portable exam 1652 hours compared to 08/31/2012 lumbar spine radiographs  FINDINGS: Tip of endotracheal tube projects 2.7 cm above carina.  Tip of nasogastric tube is in the mid thorax ; recommend advancing tube 18 cm to place proximal side-port within the proximal stomach.  LEFT jugular central venous catheter tip projecting over SVC.  Normal heart size with calcification and tortuosity of thoracic aorta.  Lungs appear emphysematous without gross infiltrate or pleural effusion.  Minimal atelectasis LEFT base.  Prominent bowel gas and stool in colon in the upper abdomen.  Gaseous distention of stomach.  Prior lumbosacral fusion.  Bones  demineralized.  IMPRESSION: Tip of nasogastric tube is in the mid thoracic esophagus; recommend advancing tube 18 cm to place proximal side-port within the proximal stomach.  Findings called to [place] [facility] [place] on 07/28/2014 at 1706 hours.   Electronically Signed   By: Lavonia Dana M.D.   On: 07/28/2014 17:07   Ct Extrem Up Entire Arm L Wo/cm  07/28/2014   CLINICAL DATA:  81 year old female  with left shoulder reverse arthroplasty. Evaluate for hematoma  EXAM: CT OF THE UPPER LEFT ARM WITHOUT CONTRAST  TECHNIQUE: Multidetector CT imaging was performed according to the standard protocol. Multiplanar CT image reconstructions were also generated.  COMPARISON:  Radiograph dated 07/28/2014 and CT dated 07/14/2014  FINDINGS: Evaluation very limited due to streak artifact caused by metallic arthroplasty.  There is a total left shoulder reverse arthroplasty. There is no evidence of dislocation. There is extensive osteoarthritic changes of the proximal humerus. No acute fracture. Multiple small bone fragments superior to the arthroplasty are similar to prior study and may represent ectopic bone formation or loose bodies. There is diffuse soft tissue edema with small fluid surrounding the joint. No evidence of large hematoma. There is postsurgical changes of the soft tissues in the anterior upper extremity with small pockets of soft tissue air. There is diffuse subcutaneous soft tissue stranding.  A small left pleural effusion may be present.  IMPRESSION: Postsurgical changes of left shoulder reverse arthroplasty. No significant fluid collection or hematoma identified.   Electronically Signed   By: Anner Crete M.D.   On: 07/28/2014 22:46    Disposition: 03- [place]        Follow-up Information    Follow up with Nita Sells, MD. Schedule an appointment as soon as possible for a visit in 2 weeks.   Specialty:  Orthopedic Surgery   Contact information:   [address] [place] [id number] [phone]        Signed: Grier Mitts 08/04/2014, 4:19 PM

## 2014-08-04 NOTE — Progress Notes (Signed)
Bipap not needed at this time. Pt resting comfortably. RT will continue to monitor

## 2014-08-04 NOTE — Progress Notes (Signed)
Occupational Therapy Treatment Patient Details Name: Andrea Gilmore MRN: [id number] DOB: January 13, 1925 Today's Date: 08/04/2014    History of present illness Pt admitted for L reverse TSA on 7/21. Transferred to ICU 7/22 due to delirium, hallucinations, fever, hypoxia and anemia and intubated. Extubated 7/24   OT comments  Pt completed bathing and dressing with mod to max assist and grooming with set up.  Educated in one hand techniques and completed ROM L elbow to hand x 10 with good tolerance. Continues to need reminders to avoid moving L shoulder.    Follow Up Recommendations  SNF;Supervision/Assistance - 24 hour    Equipment Recommendations       Recommendations for Other Services      Precautions / Restrictions Precautions Precautions: Fall;Shoulder Type of Shoulder Precautions: conservative Shoulder Interventions: Shoulder sling/immobilizer Precaution Comments: pt needs repeated reminders to not use L UE Required Braces or Orthoses: Sling Restrictions Weight Bearing Restrictions: Yes LUE Weight Bearing: Non weight bearing       Mobility Bed Mobility Pt in chair  Transfers Overall transfer level: Needs assistance Equipment used: 1 person hand held assist Transfers: Sit to/from [employer] Transfers Sit to Stand: Mod assist            Balance Overall balance assessment: Needs assistance   Sitting balance-Leahy Scale: Good       Standing balance-Leahy Scale: Poor                     ADL Overall ADL's : Needs assistance/impaired     Grooming: Oral care;Set up;Sitting;Brushing hair   Upper Body Bathing: Moderate assistance;Sitting Upper Body Bathing Details (indicate cue type and reason): instructed in hemi techniques Lower Body Bathing: Maximal assistance;Sit to/from stand   Upper Body Dressing : Maximal assistance;Sitting Upper Body Dressing Details (indicate cue type and reason): for sling, educated in proper positioning Lower Body  Dressing: Maximal assistance;Sit to/from stand Lower Body Dressing Details (indicate cue type and reason): assist to donn shoes, socks and AFOs, issued a long handled shoe horn                      Vision                     Perception     Praxis      Cognition   Behavior During Therapy: Anxious Overall Cognitive Status: Impaired/Different from baseline Area of Impairment: Safety/judgement          Safety/Judgement: Decreased awareness of safety;Decreased awareness of deficits     General Comments: cues to avoid use of L shoulder during ADL    Extremity/Trunk Assessment               Exercises  Elbow Flexion: Left;10 reps;Seated;AROM Elbow Extension: Left;10 reps;Seated;AROM Wrist Flexion: Left;AROM;10 reps Wrist Extension: AROM;Left;10 reps Digit Composite Flexion: AROM;Left;10 reps Composite Extension: AROM;Left;10 reps   Shoulder Instructions       General Comments      Pertinent Vitals/ Pain       Pain Assessment: No/denies pain  Home Living                                          Prior Functioning/Environment              Frequency Min 3X/week     Progress Toward Goals  OT Goals(current goals can now be found in the care plan section)  Progress towards OT goals: Progressing toward goals  Acute Rehab OT Goals Patient Stated Goal: be able to return to independent function  Plan Discharge plan remains appropriate    Co-evaluation                 End of Session Equipment Utilized During Treatment: Gait belt   Activity Tolerance Patient tolerated treatment well   Patient Left in chair;with call bell/phone within reach;with family/visitor present   Nurse Communication  (bath and oral care completed, pt wants neck dressing removed)        Time: 5072-2575 OT Time Calculation (min): 36 min  Charges: OT General Charges $OT Visit: 1 Procedure OT Treatments $Self Care/Home Management :  23-37 mins  Malka So 08/04/2014, 11:09 AM  [phone]

## 2014-08-04 NOTE — Progress Notes (Signed)
Physical Therapy Treatment Patient Details Name: Andrea Gilmore MRN: [id number] DOB: 09-Dec-1925 Today's Date: 08/04/2014    History of Present Illness Pt admitted for L reverse TSA on 7/21. Transferred to ICU 7/22 due to delirium, hallucinations, fever, hypoxia and anemia and intubated. Extubated 7/24    PT Comments    Pt moving much better today with basic transfers. She remains anxious about mobility and her breathing but remained 100% on RA throughout session with cues for calming and breathing techniques. Pt limited by posterior lean and decreased control of balance with transfers and gait. Continued cues throughout not to lift LUE and for safety. Will continue to follow.   Follow Up Recommendations  SNF;Supervision/Assistance - 24 hour     Equipment Recommendations       Recommendations for Other Services       Precautions / Restrictions Precautions Precautions: Fall;Shoulder Shoulder Interventions: Shoulder sling/immobilizer Required Braces or Orthoses: Sling Restrictions Weight Bearing Restrictions: Yes LUE Weight Bearing: Non weight bearing    Mobility  Bed Mobility Overal bed mobility: Needs Assistance       Supine to sit: Min assist;HOB elevated     General bed mobility comments: cues for sequence with HOB 20degrees and pt able to pivot to EOB with assist for full elevation of trunk  Transfers Overall transfer level: Needs assistance   Transfers: Sit to/from Stand;Stand Pivot Transfers Sit to Stand: Mod assist Stand pivot transfers: Mod assist;+2 safety/equipment       General transfer comment: cues for safety, sequence, hand placement with assist to control pelvis and max cues for anterior translation. Pt stood x 2 from [facility]. Total assist for pericare  Ambulation/Gait Ambulation/Gait assistance: Mod assist;+2 safety/equipment Ambulation Distance (Feet): 85 Feet Assistive device: Straight cane Gait Pattern/deviations: Step-through pattern;Leaning  posteriorly;Decreased stride length   Gait velocity interpretation: Below normal speed for age/gender General Gait Details: bilateral AFO donned with pt able to progress from heavy mod assist with gait initially due to posterior lean and decreased control of stepping to nearly min assist end of gait. Chair to follow throughout with max cues and reassurance.    Stairs            Wheelchair Mobility    Modified Rankin (Stroke Patients Only)       Balance Overall balance assessment: Needs assistance   Sitting balance-Leahy Scale: Fair       Standing balance-Leahy Scale: Poor                      Cognition Arousal/Alertness: Awake/alert Behavior During Therapy: Anxious Overall Cognitive Status: Impaired/Different from baseline Area of Impairment: Safety/judgement         Safety/Judgement: Decreased awareness of safety;Decreased awareness of deficits     General Comments: Pt anxious with mobility and needs cue for breathing technique and reassurance    Exercises General Exercises - Lower Extremity Long Arc Quad: AROM;Seated;Both;20 reps Hip Flexion/Marching: AROM;Seated;Both;20 reps    General Comments        Pertinent Vitals/Pain Pain Assessment: No/denies pain  sats 100% on RA HR 86    Home Living                      Prior Function            PT Goals (current goals can now be found in the care plan section) Progress towards PT goals: Progressing toward goals    Frequency       PT  Plan      Co-evaluation             End of Session Equipment Utilized During Treatment: Gait belt;Other (comment) (sling) Activity Tolerance: Patient tolerated treatment well Patient left: in chair;with chair alarm set;with call bell/phone within reach     Time: 0821-0855 PT Time Calculation (min) (ACUTE ONLY): 34 min  Charges:  $Gait Training: 8-22 mins $Therapeutic Activity: 8-22 mins                    G Codes:      Melford Aase 08/04/2014, 10:39 AM Elwyn Reach, [place]

## 2014-08-04 NOTE — Progress Notes (Addendum)
CSW Armed forces technical officer) spoke with pt nurse and was notified that pt could dc today. CSW notified [employer]. Awaiting dc summary and then will help with transport.   ADDENDUM: CSW notified by MD to plan for dc to [facility] on Saturday. CSW spoke with [facility] and dc summary will need to be faxed to [phone] and CSW will call nursing supervisor at [phone].  [place], [place]

## 2014-08-04 NOTE — Progress Notes (Signed)
CSW (Clinical Education officer, museum) received call inquiring about pt dc plan to ensure bed availability. CSW spoke with [facility] who will call CSW back after she is able to speak with MD.  [place], [place]

## 2014-08-04 NOTE — Progress Notes (Signed)
SUBJECTIVE:  No CP. Breathing better.  Walked a short distance with PT.  OBJECTIVE:   Vitals:   Filed Vitals:   08/04/14 0349 08/04/14 0400 08/04/14 0600 08/04/14 0800  BP:  125/42 123/41 121/37  Pulse:  52 71 40  Temp: 97.7 F (36.5 C)     TempSrc: Oral     Resp:  20 18 19   Height:      Weight:      SpO2:  100% 100% 99%   I&O's:    Intake/Output Summary (Last 24 hours) at 08/04/14 0959 Last data filed at 08/04/14 0900  Gross per 24 hour  Intake    300 ml  Output   1400 ml  Net  -1100 ml   TELEMETRY: Reviewed telemetry pt in NSR:     PHYSICAL EXAM General: Well developed, well nourished, in no acute distress Head:   Normal cephalic and atramatic  Lungs:   Coarse breath sounds bilaterally to auscultation. Heart:   HRRR S1 S2  No JVD.   Abdomen: abdomen soft and non-tender Msk:  Back normal,  Normal strength and tone for age. Extremities:   No edema.  Left arm in a sling. Neuro: Alert and oriented. Psych:  Normal affect, responds appropriately Skin: Bruising   LABS: Basic Metabolic Panel:  Recent Labs  08/03/14 0251 08/04/14 0259  NA 138 138  K 4.1 4.0  CL 106 106  CO2 23 24  GLUCOSE 78 87  BUN 30* 29*  CREATININE 1.42* 1.28*  CALCIUM 8.6* 8.2*  MG 2.0 1.9  PHOS 2.8  --    Liver Function Tests: No results for input(s): AST, ALT, ALKPHOS, BILITOT, PROT, ALBUMIN in the last 72 hours. No results for input(s): LIPASE, AMYLASE in the last 72 hours. CBC:  Recent Labs  08/03/14 0251 08/04/14 0259  WBC 15.1* 11.3*  HGB 9.2* 9.5*  HCT 27.8* 29.1*  MCV 91.7 90.1  PLT 289 323   Cardiac Enzymes:  Recent Labs  08/01/14 1556 08/01/14 2020  TROPONINI 2.06* 1.48*   BNP: Invalid input(s): POCBNP D-Dimer: No results for input(s): DDIMER in the last 72 hours. Hemoglobin A1C: No results for input(s): HGBA1C in the last 72 hours. Fasting Lipid Panel: No results for input(s): CHOL, HDL, LDLCALC, TRIG, CHOLHDL, LDLDIRECT in the last 72  hours. Thyroid Function Tests:  Recent Labs  08/01/14 1240  TSH 3.119   Anemia Panel: No results for input(s): VITAMINB12, FOLATE, FERRITIN, TIBC, IRON, RETICCTPCT in the last 72 hours. Coag Panel:   Lab Results  Component Value Date   INR 1.42 07/28/2014   INR 1.20 07/28/2014   INR 1.02 07/19/2014    RADIOLOGY: Dg Chest 2 View  07/19/2014   CLINICAL DATA:  Preoperative exam prior left shoulder surgery, history of breast malignancy  EXAM: CHEST  2 VIEW  COMPARISON:  PA and lateral chest x-ray of October 07, 2012  FINDINGS: The lungs are hyperinflated with hemidiaphragm flattening. There is no focal infiltrate. There is no pleural effusion. The heart and pulmonary vascularity are normal. There is calcification in the wall of the aortic arch. There is mild tortuosity of the descending thoracic aorta. There is a moderate-sized hiatal hernia. There is degenerative disc disease at multiple thoracic levels. There are degenerative changes of both shoulders.  IMPRESSION: COPD.  There is no active cardiopulmonary disease.   Electronically Signed   By: David  Martinique M.D.   On: 07/19/2014 15:31   Ct Head Wo Contrast  07/28/2014   CLINICAL  DATA:  81 year old who underwent left shoulder arthroplasty yesterday, now with acute delirium.  EXAM: CT HEAD WITHOUT CONTRAST  TECHNIQUE: Contiguous axial images were obtained from the base of the skull through the vertex without intravenous contrast.  COMPARISON:  CT head 05/01/2014, 10/21/2010.  MRI brain 10/21/2010.  FINDINGS: Ventricular system normal in size and appearance for age. Mild to moderate cortical and deep atrophy and moderate cerebellar atrophy, progressive since 2012. Mild changes of small vessel disease of the white matter diffusely, also progressive. Physiologic calcifications in the basal ganglia, unchanged. No mass lesion. No midline shift. No acute hemorrhage or hematoma. No extra-axial fluid collections. No evidence of acute infarction.  Mild  changes of hyperostosis frontalis interna. Visualized paranasal sinuses, bilateral mastoid air cells and bilateral middle ear cavities well-aerated. Bilateral carotid siphon and vertebral artery atherosclerosis.  IMPRESSION: 1. No acute intracranial abnormality. 2. Mild to moderate generalized atrophy and mild chronic microvascular ischemic changes of the white matter.   Electronically Signed   By: Evangeline Dakin M.D.   On: 07/28/2014 22:37   Ct Angio Chest Pe W/cm &/or Wo Cm  07/28/2014   CLINICAL DATA:  Acute onset of hypoxia. Recent left shoulder surgery. Evaluate for pulmonary embolus. Initial encounter.  EXAM: CT ANGIOGRAPHY CHEST WITH CONTRAST  TECHNIQUE: Multidetector CT imaging of the chest was performed using the standard protocol during bolus administration of intravenous contrast. Multiplanar CT image reconstructions and MIPs were obtained to evaluate the vascular anatomy.  CONTRAST:  103mL OMNIPAQUE IOHEXOL 350 MG/ML SOLN  COMPARISON:  Chest radiograph performed earlier today at 3:08 p.m.  FINDINGS: There is no evidence of pulmonary embolus.  Trace bilateral pleural fluid is noted, with minimal associated atelectasis. There is no evidence of pneumothorax. No masses are identified; no abnormal focal contrast enhancement is seen.  A moderate hiatal hernia is noted, filled with fluid. The patient's endotracheal tube is seen ending 1-2 cm above the carina. Scattered coronary artery calcifications are seen. The mediastinum is otherwise unremarkable. No mediastinal lymphadenopathy is seen. No pericardial effusion is identified. The great vessels are grossly unremarkable in appearance. Scattered calcification is noted along the aortic arch and descending thoracic aorta.  Postoperative change is noted about the left shoulder, with scattered soft tissue air and diffuse soft tissue inflammation tracking along the proximal left arm. No axillary lymphadenopathy is seen. The visualized portions of the thyroid  gland are unremarkable in appearance.  The visualized portions of the liver and spleen are unremarkable. The patient is status post cholecystectomy, with clips noted along the gallbladder fossa. The visualized portions of the pancreas, adrenal glands and kidneys are grossly unremarkable. Scattered vascular calcifications are seen at the renal hila bilaterally.  No acute osseous abnormalities are seen. A left shoulder arthroplasty is grossly unremarkable in appearance, though incompletely imaged. Endplate sclerotic change is noted at T11-T12.  Review of the MIP images confirms the above findings.  IMPRESSION: 1. No evidence of pulmonary nodules. 2. Endotracheal tube seen ending 1-2 cm above the carina. This could be retracted 1-2 cm. 3. Trace bilateral pleural fluid, with minimal associated atelectasis. 4. Moderate hiatal hernia, filled with fluid. 5. Postoperative change about the left shoulder, with scattered soft tissue air and diffuse soft tissue inflammation tracking along the proximal left arm. The visualized portions of the patient's left shoulder arthroplasty appear grossly intact, without evidence of loosening. 6. Scattered calcification along the aortic arch and distal thoracic aorta, and scattered vascular calcifications at the renal hila bilaterally.   Electronically Signed  By: Garald Balding M.D.   On: 07/28/2014 22:39   Ct Shoulder Left Wo Contrast  07/14/2014   CLINICAL DATA:  Chronic left shoulder pain for years. History of surgery in the 1980s and left breast cancer. pre-op for left shoulder replacement. Initial encounter.  EXAM: CT OF THE LEFT SHOULDER WITHOUT CONTRAST  TECHNIQUE: Multidetector CT imaging was performed according to the standard protocol. Multiplanar CT image reconstructions were also generated.  COMPARISON:  None.  FINDINGS: There are severe glenohumeral degenerative changes on the left with marked joint space loss, osteophyte and subchondral cyst formation in the humeral head  and glenoid. There is a large amount of complex fluid within the shoulder joint, superior subscapularis recess and coracoid bursa. There are multiple intra-articular loose bodies, the largest along the superior glenoid rim, measuring up to 18 mm in diameter.  The subacromial space is obliterated. There is erosion of the undersurface of the acromion. The acromioclavicular joint is widened consistent with previous distal clavicle resection. There is a large chronic rotator cuff tear with marked atrophy of the supraspinatus and infraspinous muscles.  Emphysematous changes are present within the left lung. No chest wall lesion identified. There is atherosclerosis of the aorta and coronary arteries.  IMPRESSION: 1. Advanced glenohumeral degenerative changes as described with multiple intra-articular loose bodies. 2. Obliterated subacromial space with large chronic rotator cuff tear involving the supraspinatus and infraspinous tendons.   Electronically Signed   By: Richardean Sale M.D.   On: 07/14/2014 15:44   Dg Chest Port 1 View  08/03/2014   CLINICAL DATA:  Pulmonary edema  EXAM: PORTABLE CHEST - 1 VIEW  COMPARISON:  07/31/2014  FINDINGS: Cardiac shadow is at the upper limits of normal in size but stable. Vascular congestion and pulmonary edema is noted although slightly improved when compared with the prior exam. Increasing early infiltrate is noted in the left lung base. A small left pleural effusion is noted as well. No acute bony abnormality is seen.  IMPRESSION: Vascular congestion and pulmonary edema although slightly improved when compare with the prior exam.  Increasing left basilar infiltrate.   Electronically Signed   By: Inez Catalina M.D.   On: 08/03/2014 07:28   Dg Chest Port 1 View  08/01/2014   CLINICAL DATA:  Respiratory failure.  Shortness of breath.  EXAM: PORTABLE CHEST - 1 VIEW  COMPARISON:  07/30/2014 at 0538 hour  FINDINGS: Endotracheal tube, enteric tube, and left central line have been  removed. Lower lung volumes from prior exam. Cardiomegaly is unchanged allowing for differences in technique. Development of mixed interstitial alveolar perihilar opacities, right greater than left. Progressive blunting of costophrenic angle suspicious for pleural effusions. No pneumothorax. Osseous structures are unchanged.  IMPRESSION: Development perihilar opacities concerning for pulmonary edema and probable pleural effusions. Findings consistent with CHF. Aspiration or less likely pneumonia could have a similar appearance.   Electronically Signed   By: Jeb Levering M.D.   On: 08/01/2014 00:25   Dg Chest Port 1 View  07/30/2014   CLINICAL DATA:  Ventilator dependent respiratory failure. Three days postop left shoulder arthroplasty.  EXAM: PORTABLE CHEST - 1 VIEW  COMPARISON:  07/29/2014 and earlier, including CTA chest 07/28/2014.  FINDINGS: Endotracheal tube tip in satisfactory position approximately 4 cm above the carina. Left jugular central venous catheter tip projects over the upper SVC, unchanged. Nasogastric tube courses below the diaphragm into the stomach. Cardiac silhouette mildly enlarged but stable. Mild atelectasis at the left lung base and small left  pleural effusion, slightly improved since yesterday. Lungs otherwise clear. Left shoulder arthroplasty with anatomic alignment. Severe degenerative changes in right shoulder.  IMPRESSION: 1. Support apparatus satisfactory. 2. Mild atelectasis at the left lung base and small left pleural effusion, improved since yesterday. 3. No new abnormalities.   Electronically Signed   By: Evangeline Dakin M.D.   On: 07/30/2014 09:22   Dg Chest Port 1 View  07/29/2014   CLINICAL DATA:  Check endotracheal tube placement  EXAM: PORTABLE CHEST - 1 VIEW  COMPARISON:  07/28/2014  FINDINGS: Cardiac shadow is stable. A left central venous line is again noted and stable. Endotracheal tube is been withdrawn and now lies approximately 5.5 cm above the carina. The  lungs are well aerated bilaterally with mild interstitial changes. A small left pleural effusion is again seen. Postsurgical changes in left shoulder are seen.  IMPRESSION: Interval withdrawal of the endotracheal tube. No new focal abnormality is noted.   Electronically Signed   By: Inez Catalina M.D.   On: 07/29/2014 08:14   Dg Chest Port 1 View  07/28/2014   CLINICAL DATA:  Central line placement.  EXAM: PORTABLE CHEST - 1 VIEW  COMPARISON:  Chest x-ray dated earlier same day.  FINDINGS: There has been interval placement of a left-sided central line with tip in the upper superior vena cava. Endotracheal tube has also been placed with tip slightly into the right mainstem bronchus. Cardiomediastinal silhouette is stable in size and configuration.  Suspect trace left pleural effusion. Lungs otherwise clear. No pneumothorax seen. The left shoulder replacement hardware is stable in position. No acute osseous abnormality seen.  A hiatal hernia is better appreciated on the earlier chest x-ray. Again noted is gaseous distention of the stomach.  IMPRESSION: 1. Endotracheal tube tip projects slightly into the right mainstem bronchus. Recommend retracting approximately 2 cm for optimal radiographic positioning. 2. Left-sided internal jugular central line in place with tip at the upper margin of the SVC. 3. No pneumothorax. 4. Suspect trace left pleural effusion versus mild left basilar atelectasis. Lungs otherwise clear.  These results will be called to the ordering clinician or representative by the Radiologist Assistant, and communication documented in the PACS or zVision Dashboard.   Electronically Signed   By: Franki Cabot M.D.   On: 07/28/2014 15:25   Dg Chest Port 1 View  07/28/2014   CLINICAL DATA:  Tachycardia.  Postop left shoulder replacement.  EXAM: PORTABLE CHEST - 1 VIEW  COMPARISON:  07/19/2014  FINDINGS: The patient has undergone left shoulder replacement. The heart size is accentuated by technique.  Probable left-sided pleural effusion noted. Large hiatal hernia noted. Right lung is clear. No pulmonary edema. Surgical clips overlie the left axillary region. There is gaseous distension of the stomach.  IMPRESSION: 1. Suspect left pleural effusion. 2. Hiatal hernia.  Gaseous distension of the stomach.   Electronically Signed   By: Nolon Nations M.D.   On: 07/28/2014 09:02   Dg Shoulder Left Port  07/28/2014   CLINICAL DATA:  Pain  EXAM: LEFT SHOULDER - 1 VIEW  COMPARISON:  July 27, 2014  FINDINGS: Frontal view obtained. There is a total shoulder replacement prosthesis on the left with alignment anatomic on this single view. No fracture or dislocation apparent. There is evidence of old trauma involving the lateral left clavicle with widening between the clavicle and acromion, stable. No coracoclavicular separation seen on this study.  IMPRESSION: No change in total shoulder prosthesis alignment on frontal view. No acute  fracture or dislocation. Stable widening between the lateral left clavicle and acromion with evidence of old trauma involving the lateral left clavicle.   Electronically Signed   By: Lowella Grip III M.D.   On: 07/28/2014 15:19   Dg Shoulder Left Port  07/27/2014   CLINICAL DATA:  Postop  EXAM: LEFT SHOULDER - 1 VIEW  COMPARISON:  07/14/2014  FINDINGS: Left total shoulder arthroplasty is anatomically aligned. There is no breakage or loosening of the hardware. Gas in the soft tissues is consistent with recent surgery. Loose body above the acetabular component is stable.  IMPRESSION: Left total shoulder arthroplasty anatomically aligned.   Electronically Signed   By: Marybelle Killings M.D.   On: 07/27/2014 10:04   Dg Abd Portable 1v  07/29/2014   CLINICAL DATA:  81 year old female status post enteric tube placement. Check position.  EXAM: PORTABLE ABDOMEN - 1 VIEW  COMPARISON:  Earlier Radiograph dated 07/28/2014  FINDINGS: There has been interval placement of an enteric tube with tip in  the right hemi abdomen likely in the distal stomach. There is no evidence of bowel obstruction. No free air. Moderate stool noted throughout the colon. Excreted contrast noted within the renal collecting systems bilaterally. Cholecystectomy clips. L4-L5 disc spacer and posterior fixation hardware. Degenerative changes of the spine.  IMPRESSION: Enteric tube with tip in the right abdomen likely in the distal stomach. No evidence of bowel obstruction.   Electronically Signed   By: Anner Crete M.D.   On: 07/29/2014 01:11   Dg Abd Portable 1v  07/28/2014   CLINICAL DATA:  Nasogastric tube placement  EXAM: PORTABLE ABDOMEN - 1 VIEW  COMPARISON:  Portable exam 1652 hours compared to 08/31/2012 lumbar spine radiographs  FINDINGS: Tip of endotracheal tube projects 2.7 cm above carina.  Tip of nasogastric tube is in the mid thorax ; recommend advancing tube 18 cm to place proximal side-port within the proximal stomach.  LEFT jugular central venous catheter tip projecting over SVC.  Normal heart size with calcification and tortuosity of thoracic aorta.  Lungs appear emphysematous without gross infiltrate or pleural effusion.  Minimal atelectasis LEFT base.  Prominent bowel gas and stool in colon in the upper abdomen.  Gaseous distention of stomach.  Prior lumbosacral fusion.  Bones demineralized.  IMPRESSION: Tip of nasogastric tube is in the mid thoracic esophagus; recommend advancing tube 18 cm to place proximal side-port within the proximal stomach.  Findings called to [place] [facility] [place] on 07/28/2014 at 1706 hours.   Electronically Signed   By: Lavonia Dana M.D.   On: 07/28/2014 17:07   Ct Extrem Up Entire Arm L Wo/cm  07/28/2014   CLINICAL DATA:  81 year old female with left shoulder reverse arthroplasty. Evaluate for hematoma  EXAM: CT OF THE UPPER LEFT ARM WITHOUT CONTRAST  TECHNIQUE: Multidetector CT imaging was performed according to the standard protocol. Multiplanar CT image reconstructions were also  generated.  COMPARISON:  Radiograph dated 07/28/2014 and CT dated 07/14/2014  FINDINGS: Evaluation very limited due to streak artifact caused by metallic arthroplasty.  There is a total left shoulder reverse arthroplasty. There is no evidence of dislocation. There is extensive osteoarthritic changes of the proximal humerus. No acute fracture. Multiple small bone fragments superior to the arthroplasty are similar to prior study and may represent ectopic bone formation or loose bodies. There is diffuse soft tissue edema with small fluid surrounding the joint. No evidence of large hematoma. There is postsurgical changes of the soft tissues in the anterior  upper extremity with small pockets of soft tissue air. There is diffuse subcutaneous soft tissue stranding.  A small left pleural effusion may be present.  IMPRESSION: Postsurgical changes of left shoulder reverse arthroplasty. No significant fluid collection or hematoma identified.   Electronically Signed   By: Anner Crete M.D.   On: 07/28/2014 22:46      ASSESSMENT: Kathyrn Lass:  Cardiomyopathy: possibly stress induced.  Patient without any CP at this time.  Now off of supplemental oxygen. Significant improvement over the last few days.  Will add low dose beta blocker to help with LV dysfunction.  She will need repeat echo in 4-6 weeks to see if there has been improvement in her LV dysfunction.    Mild decrease in cr  today. Would not cath.  Continue medical therapy for fluid overload as she tolerates.  She appears euvolemic.  Troponin trending down.  OK to walk with PT.   Continued heparin IV for 48 hours total. Now on subcutaneous heparin for DVT prophylaxis.  Jettie Booze, MD  08/04/2014  9:59 AM

## 2014-08-04 NOTE — Progress Notes (Signed)
[place] TEAM 1 - Stepdown/ICU TEAM Progress Note  Andrea Gilmore GBT:[id number] DOB: 07/23/1925 DOA: 07/27/2014 PCP: Mayra Neer, MD  Admit HPI / Brief Narrative: 81 y/o F with PMHx of hypothyroidism, Dx Breast Ca high-grade ductal carcinoma in situ, ER and PR negative S/P lumpectomy+ XRT completed 2006, CKD stage III, iron deficiency anemia and osteroarthritis who was admitted 7/21 for planned left shoulder reverse arthroplasty.   Post-operatively 7/22 in the early am, the patient developed delirium, hallucinations, fever, and hypoxia. She was transferred to SDU and worked up for sepsis by [facility]. She was later noted to have left arm swelling and erythema. As well as a hgb drop from 10.8 to 7.6. CT of the arm was ordered per [facility] and is pending to further evaluate L arm swelling. The patient was typed and screened for 2 units PRBC's. PCCM consulted for evaluation 7/22 with decline in clinical status.   HPI/Subjective: 7/29 A/O x 4 negative SOB, negative CP, negative diaphoresis, negative N/V. Patient somewhat confused concerning her discharge. Her understanding was that she would be discharged to middle of the week.   Assessment/Plan: Sepsis unspecified organism/aspiration PNA vs joint infection. - blood cultures NGTD -Urinalysis positive pseudomonas pan sensitive  Acute respiratory failure with hypoxia -Interventional cardiology felt MI less likely; most likely secondary to stress cardiomyopathy, therefore cardiac catheterization canceled -Troponins trending down   Pulmonary Edema /Lt pleural effusion? -7/26 PCXR  c/w  Pulmonary Edema also possible Pleural effusion -7/28 PCXR pulmonary edema probably slightly improved  ACS vs NSTEMI -Strict in and out; since admission + 5.3 L -Felt more likely diagnosis was stress cardiomyopathy. Recommended aggressive diuresis.  CKD, stage III (baseline Cr~1.1-1.4) -Currently at baseline  UTI Pseudomonas pansensitive - Continue  ciprofloxacin; complete 5 day course of antibiotics  Known chronic left rotator cuff status post reversed shoulder arthroplasty -Management per orthopedic team -Marked edema and bruising left upper extremity below operative site resolving  History of iron deficiency anemia  -Baseline hemoglobin 10.8; trending down slightly however no active bleeding observed  Acute Blood Loss Anemia - suspected L arm hemorrhage post surgery, r/o other etiology -Tansfuse for Hb < 8  Protein calorie malnutrition.  Hypothyroidism  -TSH WNL -Continue Synthroid 50 uq Daily    Code Status: Partial  Family Communication: None present at time of exam Disposition Plan: In Am. to [address]  Consultants: Dr Mertie Moores (Cardiology) Dr Tania Ade (Orthopedics) Dr Rush Farmer (PCCM)   Procedure/Significant Events: 7/21LEFT REVERSE SHOULDER ARTHROPLASTY   7/23 echocardiogram;- LVEF= 65%. -findings c/w Lt ventricular diastolic dysfunction. - Pulmonary arteries: PA peak pressure: 34 mm Hg (S). 7/26 echocardiogram;- LVEF=  20% to 25%. Akinesis mid-apicalanteroseptal myocardium. Akinesis of the mid-apicalinferior myocardium. - (grade 2diastolic dysfunction). - Mitral valve: moderate regurgitation.-Left atrium:  moderately dilated.-Tricuspid valve: moderate regurgitation. - Pulmonary arteries: PA peak pressure: 63 mm Hg (S).   Culture 7/22 blood right neck/left A-line  >>> negative  7/22 urine >>> positive pseudomonas pan sensitive 7/22 tracheal aspirate negative    Antibiotics: Vancomycin 7/22>>>7/25 Zosyn 7/22>>>7/25 Cipro 7/25>>> stopped 7/30   DVT prophylaxis: ACS Heparin    Devices    LINES / TUBES:  CVL L IJ TLC 7/22>>>     Continuous Infusions:    Objective: VITAL SIGNS: Temp: 97.8 F (36.6 C) (07/29 1713) Temp Source: Oral (07/29 1713) BP: 128/47 mmHg (07/29 1713) Pulse Rate: 70 (07/29 1713) SPO2; FIO2:   Intake/Output Summary (Last 24 hours) at  08/04/14 1838 Last data filed at 08/04/14 1300  Gross per 24 hour  Intake    540 ml  Output   1400 ml  Net   -860 ml     Exam: General: A/O 4, NAD, negative acute respiratory distress on room air, resting in bed comfortably Eyes: Negative headache, negative scleral hemorrhage ENT: Negative Runny nose, negative ear pain, negative tinnitus, negative gingival bleeding Neck:  Negative scars, masses, torticollis, lymphadenopathy, JVD Lungs: Clear to auscultation bilaterally without wheezes or crackles Cardiovascular: Regular rate and rhythm without murmur gallop or rub normal S1 and S2 Abdomen:negative abdominal pain, negative dysphagia, Nontender, nondistended, soft, bowel sounds positive, no rebound, no ascites, no appreciable mass Extremities: No significant cyanosis, clubbing, or edema bilateral lower extremities Psychiatric:  Negative depression, negative anxiety, negative fatigue, negative mania  Neurologic:  Cranial nerves II through XII intact, tongue/uvula midline, all extremities muscle strength 5/5, sensation intact throughout,negative dysarthria, negative expressive aphasia, negative receptive aphasia.      Data Reviewed: Basic Metabolic Panel:  Recent Labs Lab 07/29/14 0300 07/30/14 0358 07/31/14 0500 08/01/14 0043 08/02/14 0235 08/03/14 0251 08/04/14 0259  NA 136 139 141 142 140 138 138  K 4.1 3.5 3.7 3.6 5.4* 4.1 4.0  CL 109 114* 118* 111 111 106 106  CO2 18* 19* 19* 19* 16* 23 24  GLUCOSE 83 109* 88 225* 121* 78 87  BUN 19 19 13 14  23* 30* 29*  CREATININE 1.25* 0.99 0.94 1.02* 1.31* 1.42* 1.28*  CALCIUM 7.2* 6.8* 6.9* 8.1* 8.6* 8.6* 8.2*  MG 1.6* 2.1 1.8 1.7 1.6* 2.0 1.9  PHOS 3.6 1.9* 2.2* 3.6  --  2.8  --    Liver Function Tests: No results for input(s): AST, ALT, ALKPHOS, BILITOT, PROT, ALBUMIN in the last 168 hours. No results for input(s): LIPASE, AMYLASE in the last 168 hours. No results for input(s): AMMONIA in the last 168 hours. CBC:  Recent  Labs Lab 07/31/14 0500 08/01/14 0043 08/02/14 0235 08/03/14 0251 08/04/14 0259  WBC 8.9 14.6* 22.0* 15.1* 11.3*  HGB 8.1* 10.0* 10.0* 9.2* 9.5*  HCT 24.0* 29.7* 29.8* 27.8* 29.1*  MCV 88.9 88.7 90.6 91.7 90.1  PLT 156 282 274 289 323   Cardiac Enzymes:  Recent Labs Lab 08/01/14 0043 08/01/14 0852 08/01/14 1556 08/01/14 2020  TROPONINI 0.43* 2.38* 2.06* 1.48*   BNP (last 3 results)  Recent Labs  08/01/14 1924  BNP >4500.0*    ProBNP (last 3 results) No results for input(s): PROBNP in the last 8760 hours.  CBG:  Recent Labs Lab 07/30/14 1938 07/31/14 0851 07/31/14 1157 07/31/14 1619 07/31/14 2144  GLUCAP 100* 94 78 77 79    Recent Results (from the past 240 hour(s))  Culture, blood (routine x 2)     Status: None   Collection Time: 07/28/14  7:25 AM  Result Value Ref Range Status   Specimen Description BLOOD LEFT ARM  Final   Special Requests BOTTLES DRAWN AEROBIC AND ANAEROBIC 5CC  Final   Culture NO GROWTH 5 DAYS  Final   Report Status 08/02/2014 FINAL  Final  Culture, blood (routine x 2)     Status: None   Collection Time: 07/28/14  7:35 AM  Result Value Ref Range Status   Specimen Description BLOOD RIGHT ANTECUBITAL  Final   Special Requests BOTTLES DRAWN AEROBIC AND ANAEROBIC 5CC  Final   Culture NO GROWTH 5 DAYS  Final   Report Status 08/02/2014 FINAL  Final  Urine culture     Status: None   Collection Time: 07/28/14 10:44  AM  Result Value Ref Range Status   Specimen Description URINE, CATHETERIZED  Final   Special Requests NONE  Final   Culture 40,000 COLONIES/ml PSEUDOMONAS AERUGINOSA  Final   Report Status 07/31/2014 FINAL  Final   Organism ID, Bacteria PSEUDOMONAS AERUGINOSA  Final      Susceptibility   Pseudomonas aeruginosa - MIC*    CEFTAZIDIME <=1 SENSITIVE Sensitive     CIPROFLOXACIN 0.5 SENSITIVE Sensitive     GENTAMICIN <=1 SENSITIVE Sensitive     IMIPENEM <=0.25 SENSITIVE Sensitive     PIP/TAZO <=4 SENSITIVE Sensitive      CEFEPIME <=1 SENSITIVE Sensitive     * 40,000 COLONIES/ml PSEUDOMONAS AERUGINOSA  MRSA PCR Screening     Status: None   Collection Time: 07/28/14 12:02 PM  Result Value Ref Range Status   MRSA by PCR NEGATIVE NEGATIVE Final    Comment:        The GeneXpert MRSA Assay (FDA approved for NASAL specimens only), is one component of a comprehensive MRSA colonization surveillance program. It is not intended to diagnose MRSA infection nor to guide or monitor treatment for MRSA infections.   Urine culture     Status: None   Collection Time: 07/28/14  3:27 PM  Result Value Ref Range Status   Specimen Description URINE, CATHETERIZED  Final   Special Requests NONE  Final   Culture NO GROWTH 1 DAY  Final   Report Status 07/29/2014 FINAL  Final  Culture, blood (x 2)     Status: None   Collection Time: 07/28/14  3:30 PM  Result Value Ref Range Status   Specimen Description BLOOD RIGHT NECK  Final   Special Requests BOTTLES DRAWN AEROBIC ONLY 10CC RIGHT IJ  Final   Culture NO GROWTH 5 DAYS  Final   Report Status 08/02/2014 FINAL  Final  Culture, blood (x 2)     Status: None   Collection Time: 07/28/14  3:40 PM  Result Value Ref Range Status   Specimen Description BLOOD LEFT A-LINE  Final   Special Requests BOTTLES DRAWN AEROBIC ONLY 10C  Final   Culture NO GROWTH 5 DAYS  Final   Report Status 08/02/2014 FINAL  Final  Culture, respiratory (NON-Expectorated)     Status: None   Collection Time: 07/28/14  4:35 PM  Result Value Ref Range Status   Specimen Description TRACHEAL ASPIRATE  Final   Special Requests Normal  Final   Gram Stain   Final    FEW WBC PRESENT,BOTH PMN AND MONONUCLEAR RARE SQUAMOUS EPITHELIAL CELLS PRESENT NO ORGANISMS SEEN Performed at [facility]    Culture   Final    NO GROWTH 2 DAYS Performed at [facility]    Report Status 07/31/2014 FINAL  Final     Studies:  Recent x-ray studies have been reviewed in detail by the Attending  Physician  Scheduled Meds:  Scheduled Meds: . antiseptic oral rinse  7 mL Mouth Rinse BID  . feeding supplement (ENSURE ENLIVE)  237 mL Oral BID BM  . heparin subcutaneous  5,000 Units Subcutaneous 3 times per day  . levothyroxine  50 mcg Oral QAC breakfast  . metoprolol tartrate  12.5 mg Oral BID  . pantoprazole  40 mg Oral Daily  . sodium chloride  3 mL Intravenous Q12H    Time spent on care of this patient: 40 mins   WOODS, Geraldo Docker , MD  [facility] Office  [phone] Pager [phone]  On-Call/Text Page:  [email or web address]      password [email or web address]  If 7PM-7AM, please contact night-coverage [email or web address] Password [facility] 08/04/2014, 6:38 PM   LOS: 8 days   Care during the described time interval was provided by me .  I have reviewed this patient's available data, including medical history, events of note, physical examination, and all test results as part of my evaluation. I have personally reviewed and interpreted all radiology studies.   Dia Crawford, MD [phone] Pager

## 2014-08-04 NOTE — Progress Notes (Signed)
Nutrition Follow-up  DOCUMENTATION CODES:   Severe malnutrition in context of chronic illness  INTERVENTION:   -Ensure Enlive po BID, each supplement provides 350 kcal and 20 grams of protein -[employer] Cup with meals  NUTRITION DIAGNOSIS:   Inadequate oral intake related to poor appetite as evidenced by meal completion < 50%.  Ongoing  GOAL:   Patient will meet greater than or equal to 90% of their needs  Unmet  MONITOR:   PO intake, Supplement acceptance, Labs, I & O's  REASON FOR ASSESSMENT:   Consult Enteral/tube feeding initiation and management  ASSESSMENT:   Patient admitted on 7/21 with left shoulder pain and dysfunction from left shoulder endstage bone on bone rotator cuff tear arthropathy. S/P surgery on 7/21.    Pt transferred from ICU to SDU on 08/03/14. Per chart review, respiratory status improving; pt did not require Bi-Pap last night.   Spoke with pt, who was sitting in recliner at time of visit. She reports her appetite is poor, but slightly improving. She shares that she is a picky eater at baseline. Noted meal intake has improved slightly (PO: 25-90%). Pt shared she consumed a tomato sandwich and a fruit cup for dinner last night and family has been providing outside food.   She reports consuming some of her Magic cups as well as strawberry ice cream. Pt is requesting supplements, reporting her doctor encouraged her to consume two daily.   Discussed importance of good nutritional intake (meals and supplements) to promote healing.  Pt reports discharge disposition is to [facility] early next week.   Diet Order:  Diet Heart Room service appropriate?: Yes; Fluid consistency:: Thin  Skin:  Reviewed, no issues (closed lt shoulder incision)  Last BM:  07/31/14  Height:   Ht Readings from Last 1 Encounters:  07/31/14 4\' 11"  (1.499 m)    Weight:   Wt Readings from Last 1 Encounters:  08/04/14 110 lb 14.3 oz (50.3 kg)    Ideal Body  Weight:  44.5 kg  BMI:  Body mass index is 22.39 kg/(m^2).  Estimated Nutritional Needs:   Kcal:  1300-1500  Protein:  65-75 grams  Fluid:  > 1.5 L/day  EDUCATION NEEDS:   No education needs identified at this time  Terreon Ekholm A. Jimmye Norman, RD, LDN, CDE Pager: [phone] After hours Pager: [phone]

## 2014-08-05 DIAGNOSIS — J9601 Acute respiratory failure with hypoxia: Secondary | ICD-10-CM | POA: Diagnosis not present

## 2014-08-05 DIAGNOSIS — G9341 Metabolic encephalopathy: Secondary | ICD-10-CM | POA: Diagnosis not present

## 2014-08-05 DIAGNOSIS — M19012 Primary osteoarthritis, left shoulder: Secondary | ICD-10-CM | POA: Diagnosis not present

## 2014-08-05 DIAGNOSIS — E43 Unspecified severe protein-calorie malnutrition: Secondary | ICD-10-CM | POA: Diagnosis not present

## 2014-08-05 DIAGNOSIS — D509 Iron deficiency anemia, unspecified: Secondary | ICD-10-CM | POA: Diagnosis not present

## 2014-08-05 DIAGNOSIS — E875 Hyperkalemia: Secondary | ICD-10-CM | POA: Diagnosis not present

## 2014-08-05 DIAGNOSIS — K219 Gastro-esophageal reflux disease without esophagitis: Secondary | ICD-10-CM | POA: Diagnosis not present

## 2014-08-05 DIAGNOSIS — J449 Chronic obstructive pulmonary disease, unspecified: Secondary | ICD-10-CM | POA: Diagnosis not present

## 2014-08-05 DIAGNOSIS — M25511 Pain in right shoulder: Secondary | ICD-10-CM | POA: Diagnosis not present

## 2014-08-05 DIAGNOSIS — A419 Sepsis, unspecified organism: Secondary | ICD-10-CM | POA: Diagnosis not present

## 2014-08-05 DIAGNOSIS — N183 Chronic kidney disease, stage 3 (moderate): Secondary | ICD-10-CM | POA: Diagnosis not present

## 2014-08-05 DIAGNOSIS — F419 Anxiety disorder, unspecified: Secondary | ICD-10-CM | POA: Diagnosis not present

## 2014-08-05 DIAGNOSIS — E46 Unspecified protein-calorie malnutrition: Secondary | ICD-10-CM | POA: Diagnosis not present

## 2014-08-05 DIAGNOSIS — J810 Acute pulmonary edema: Secondary | ICD-10-CM | POA: Diagnosis not present

## 2014-08-05 DIAGNOSIS — Z96612 Presence of left artificial shoulder joint: Secondary | ICD-10-CM | POA: Diagnosis not present

## 2014-08-05 DIAGNOSIS — E038 Other specified hypothyroidism: Secondary | ICD-10-CM | POA: Diagnosis not present

## 2014-08-05 DIAGNOSIS — Z96619 Presence of unspecified artificial shoulder joint: Secondary | ICD-10-CM | POA: Insufficient documentation

## 2014-08-05 DIAGNOSIS — M6281 Muscle weakness (generalized): Secondary | ICD-10-CM | POA: Diagnosis not present

## 2014-08-05 DIAGNOSIS — R05 Cough: Secondary | ICD-10-CM | POA: Diagnosis not present

## 2014-08-05 DIAGNOSIS — R5381 Other malaise: Secondary | ICD-10-CM | POA: Diagnosis not present

## 2014-08-05 DIAGNOSIS — G9340 Encephalopathy, unspecified: Secondary | ICD-10-CM | POA: Diagnosis not present

## 2014-08-05 DIAGNOSIS — D62 Acute posthemorrhagic anemia: Secondary | ICD-10-CM | POA: Diagnosis not present

## 2014-08-05 DIAGNOSIS — F411 Generalized anxiety disorder: Secondary | ICD-10-CM | POA: Diagnosis not present

## 2014-08-05 DIAGNOSIS — J690 Pneumonitis due to inhalation of food and vomit: Secondary | ICD-10-CM | POA: Diagnosis not present

## 2014-08-05 DIAGNOSIS — N3000 Acute cystitis without hematuria: Secondary | ICD-10-CM | POA: Diagnosis not present

## 2014-08-05 DIAGNOSIS — R260 Ataxic gait: Secondary | ICD-10-CM | POA: Diagnosis not present

## 2014-08-05 DIAGNOSIS — E039 Hypothyroidism, unspecified: Secondary | ICD-10-CM | POA: Diagnosis not present

## 2014-08-05 DIAGNOSIS — Z471 Aftercare following joint replacement surgery: Secondary | ICD-10-CM | POA: Diagnosis not present

## 2014-08-05 DIAGNOSIS — R278 Other lack of coordination: Secondary | ICD-10-CM | POA: Diagnosis not present

## 2014-08-05 DIAGNOSIS — R1313 Dysphagia, pharyngeal phase: Secondary | ICD-10-CM | POA: Diagnosis not present

## 2014-08-05 DIAGNOSIS — M12512 Traumatic arthropathy, left shoulder: Secondary | ICD-10-CM | POA: Diagnosis not present

## 2014-08-05 DIAGNOSIS — I214 Non-ST elevation (NSTEMI) myocardial infarction: Secondary | ICD-10-CM | POA: Diagnosis not present

## 2014-08-05 DIAGNOSIS — I5181 Takotsubo syndrome: Secondary | ICD-10-CM | POA: Diagnosis not present

## 2014-08-05 DIAGNOSIS — M25512 Pain in left shoulder: Secondary | ICD-10-CM | POA: Diagnosis not present

## 2014-08-05 LAB — BASIC METABOLIC PANEL
ANION GAP: 10 (ref 5–15)
BUN: 25 mg/dL — ABNORMAL HIGH (ref 6–20)
CO2: 26 mmol/L (ref 22–32)
CREATININE: 1.19 mg/dL — AB (ref 0.44–1.00)
Calcium: 8.5 mg/dL — ABNORMAL LOW (ref 8.9–10.3)
Chloride: 102 mmol/L (ref 101–111)
GFR calc Af Amer: 46 mL/min — ABNORMAL LOW (ref >60)
GFR, EST NON AFRICAN AMERICAN: 40 mL/min — AB (ref >60)
Glucose, Bld: 95 mg/dL (ref 65–99)
Potassium: 3.9 mmol/L (ref 3.5–5.1)
SODIUM: 138 mmol/L (ref 135–145)

## 2014-08-05 LAB — TROPONIN I: Troponin I: 0.37 ng/mL — ABNORMAL HIGH (ref <0.031)

## 2014-08-05 LAB — CBC
HCT: 31 % — ABNORMAL LOW (ref 36.0–46.0)
Hemoglobin: 10.2 g/dL — ABNORMAL LOW (ref 12.0–15.0)
MCH: 29.9 pg (ref 26.0–34.0)
MCHC: 32.9 g/dL (ref 30.0–36.0)
MCV: 90.9 fL (ref 78.0–100.0)
PLATELETS: 448 10*3/uL — AB (ref 150–400)
RBC: 3.41 MIL/uL — AB (ref 3.87–5.11)
RDW: 15.4 % (ref 11.5–15.5)
WBC: 10.4 10*3/uL (ref 4.0–10.5)

## 2014-08-05 LAB — MAGNESIUM: Magnesium: 1.9 mg/dL (ref 1.7–2.4)

## 2014-08-05 MED ORDER — KETOROLAC TROMETHAMINE 15 MG/ML IJ SOLN
7.5000 mg | Freq: Once | INTRAMUSCULAR | Status: AC
  Administered 2014-08-05: 7.5 mg via INTRAVENOUS
  Filled 2014-08-05: qty 1

## 2014-08-05 NOTE — Progress Notes (Signed)
Patient s/p L reverse hemi with multiple medical issues postop. Currently awaiting input on discharge with heparin vs. Lovenox. Currently notes do not answer this question yet.  Patient comfortable Left sling in place NVI  Will await further input from cardiology on propr anticoag. Patient may be discharged once this is decided.

## 2014-08-05 NOTE — Progress Notes (Signed)
Patient ID: VERONICA FRETZ, female   DOB: July 24, 1925, 81 y.o.   MRN: [id number]   SUBJECTIVE:  No CP. Going to SNF   OBJECTIVE:   Vitals:   Filed Vitals:   08/05/14 0333 08/05/14 0400 08/05/14 0755 08/05/14 0800  BP:  122/62  94/76  Pulse:  64 73 73  Temp: 98.9 F (37.2 C)   97.7 F (36.5 C)  TempSrc: Oral   Oral  Resp:  16 19 17   Height:      Weight: 49.8 kg (109 lb 12.6 oz)     SpO2:  100% 100% 100%   I&O's:    Intake/Output Summary (Last 24 hours) at 08/05/14 1012 Last data filed at 08/05/14 0800  Gross per 24 hour  Intake    660 ml  Output    575 ml  Net     85 ml   TELEMETRY: Reviewed telemetry pt in NSR: 08/05/2014      PHYSICAL EXAM General: Well developed, well nourished, in no acute distress Head:   Normal cephalic and atramatic  Lungs:   Coarse breath sounds bilaterally to auscultation. Heart:   HRRR S1 S2  No JVD.   Abdomen: abdomen soft and non-tender Msk:  Back normal,  Normal strength and tone for age. Extremities:   No edema.  Left arm in a sling. Neuro: Alert and oriented. Psych:  Normal affect, responds appropriately Skin: Bruising   LABS: Basic Metabolic Panel:  Recent Labs  08/03/14 0251 08/04/14 0259 08/05/14 0211  NA 138 138 138  K 4.1 4.0 3.9  CL 106 106 102  CO2 23 24 26   GLUCOSE 78 87 95  BUN 30* 29* 25*  CREATININE 1.42* 1.28* 1.19*  CALCIUM 8.6* 8.2* 8.5*  MG 2.0 1.9 1.9  PHOS 2.8  --   --    CBC:  Recent Labs  08/04/14 0259 08/05/14 0211  WBC 11.3* 10.4  HGB 9.5* 10.2*  HCT 29.1* 31.0*  MCV 90.1 90.9  PLT 323 448*   Coag Panel:   Lab Results  Component Value Date   INR 1.42 07/28/2014   INR 1.20 07/28/2014   INR 1.02 07/19/2014      ASSESSMENT: Kathyrn Lass:  Cardiomyopathy: possibly stress induced.  Patient without any CP at this time.  Now off of supplemental oxygen. Significant improvement over the last few days. Mild decrease in cr  today. Would not cath.  Continue medical therapy for fluid overload as  she tolerates.  She appears euvolemic.  On beta blocker ? Stress induced DCM  Not on ACe Or diuretic due to apparent euvolemia and soft BP  Will arrange transitional care visit with PA and f/u Dr Irish Lack  Troponin trending down.  OK to walk with PT.   Discussed with nurse  Appears that Dr Irish Lack just wanted iv heparin for 48 hours given low EF and elevated troponin No indication for anticoagulation at SNF Can have DVT prophylaxis per primary service but no need from cardiac perspective  ASA    Josue Hector, MD  08/05/2014  10:12 AM

## 2014-08-05 NOTE — Progress Notes (Signed)
Patient ID: ALYRICA THUROW, female   DOB: Dec 10, 1925, 81 y.o.   MRN: [id number] Called by nurse about SSCP Patient sitting in chair no respiratory distress ECG no acut St elevation  CT previously with no PE Large hiatal hernia Allergic to morphine products Nurse to restart iv  Rx Toradol for now Check troponin  If pain goes away and troponin not re elevated can go to SNF otherwise will have to stay Asked nurse to have primary service check on again latter. Discussed with daughter  Jenkins Rouge

## 2014-08-05 NOTE — Progress Notes (Signed)
[facility] TEAM 1 - Stepdown/ICU TEAM Progress Note  Andrea Gilmore WFU:[id number] DOB: [birth year] DOA: 07/27/2014 PCP: Mayra Neer, MD  Admit HPI / Brief Narrative: 81 y/o F with PMHx of hypothyroidism, Dx Breast Ca high-grade ductal carcinoma in situ, ER and PR negative S/P lumpectomy+ XRT completed 2006, CKD stage III, iron deficiency anemia and osteroarthritis who was admitted 7/21 for planned left shoulder reverse arthroplasty.   Post-operatively 7/22 in the early am, the patient developed delirium, hallucinations, fever, and hypoxia. She was transferred to SDU and worked up for sepsis by [facility]. She was later noted to have left arm swelling and erythema. As well as a hgb drop from 10.8 to 7.6. CT of the arm was ordered per [facility] and is pending to further evaluate L arm swelling. The patient was typed and screened for 2 units PRBC's. PCCM consulted for evaluation 7/22 with decline in clinical status.   HPI/Subjective: 7/30 A/O x 4 negative SOB, negative CP, negative diaphoresis, negative N/V. Sitting in chair comfortably dressed and ready for discharge.   Assessment/Plan: Sepsis unspecified organism/aspiration PNA vs joint infection. - blood cultures NGTD -Urinalysis positive pseudomonas pan sensitive; completed 5 day course antibiotics  Acute respiratory failure with hypoxia -Interventional cardiology felt MI less likely; most likely secondary to stress cardiomyopathy, therefore cardiac catheterization canceled -Resolved -Follow-up with PCP in a week   Pulmonary Edema /Lt pleural effusion? -7/26 PCXR  c/w  Pulmonary Edema also possible Pleural effusion -7/28 PCXR pulmonary edema probably slightly improved -Patient's respiratory status has significantly improved.  Stress cardiomyopathy/ACS vs NSTEMI -Strict in and out; since admission + 5.3 L -Felt more likely diagnosis was stress cardiomyopathy. Recommended aggressive diuresis. -Reviewed cardiac findings with patient and  daughter.  CKD, stage III (baseline Cr~1.1-1.4) -Currently at baseline  UTI Pseudomonas pansensitive - Completed 5 day course of antibiotics  Known chronic left rotator cuff status post reversed shoulder arthroplasty -Patient ready for discharge per orthopedic surgery follow-up with Dr. Tamera Punt in 2 weeks postdischarge.   History of iron deficiency anemia  -Baseline hemoglobin 10.8;  7/30; hemoglobin at baseline -PCP to monitor  Acute Blood Loss Anemia - suspected L arm hemorrhage post surgery, r/o other etiology -Tansfuse for Hb < 8  Protein calorie malnutrition.  Hypothyroidism  -TSH WNL -Continue Synthroid 50 uq Daily    Code Status: Partial  Family Communication: Daughter present at time of exam Disposition Plan: Discharge to [facility]  Consultants: Dr Mertie Moores (Cardiology) Dr Tania Ade (Orthopedics) Dr Rush Farmer (PCCM)   Procedure/Significant Events: 7/21LEFT REVERSE SHOULDER ARTHROPLASTY   7/23 echocardiogram;- LVEF= 65%. -findings c/w Lt ventricular diastolic dysfunction. - Pulmonary arteries: PA peak pressure: 34 mm Hg (S). 7/26 echocardiogram;- LVEF=  20% to 25%. Akinesis mid-apicalanteroseptal myocardium. Akinesis of the mid-apicalinferior myocardium. - (grade 2diastolic dysfunction). - Mitral valve: moderate regurgitation.-Left atrium:  moderately dilated.-Tricuspid valve: moderate regurgitation. - Pulmonary arteries: PA peak pressure: 63 mm Hg (S).   Culture 7/22 blood right neck/left A-line  >>> negative  7/22 urine >>> positive pseudomonas pan sensitive 7/22 tracheal aspirate negative    Antibiotics: Vancomycin 7/22>>>7/25 Zosyn 7/22>>>7/25 Cipro 7/25>>> stopped 7/30   DVT prophylaxis: ACS Heparin    Devices    LINES / TUBES:  CVL L IJ TLC 7/22>>>     Continuous Infusions:    Objective: VITAL SIGNS: Temp: 97.7 F (36.5 C) (07/30 0800) Temp Source: Oral (07/30 0800) BP: 94/76 mmHg (07/30 0800) Pulse  Rate: 73 (07/30 0800) SPO2; FIO2:   Intake/Output Summary (Last  24 hours) at 08/05/14 1132 Last data filed at 08/05/14 1000  Gross per 24 hour  Intake    780 ml  Output    575 ml  Net    205 ml     Exam: General: A/O 4, NAD, negative acute respiratory distress on room air, Sitting in chair comfortably Eyes: Negative headache, negative scleral hemorrhage ENT: Negative Runny nose, negative ear pain, negative tinnitus, negative gingival bleeding Neck:  Negative scars, masses, torticollis, lymphadenopathy, JVD Lungs: Clear to auscultation bilaterally without wheezes or crackles Cardiovascular: Regular rate and rhythm without murmur gallop or rub normal S1 and S2 Abdomen:negative abdominal pain, negative dysphagia, Nontender, nondistended, soft, bowel sounds positive, no rebound, no ascites, no appreciable mass Extremities: No significant cyanosis, clubbing, or edema bilateral lower extremities, left arm in sling post surgery, negative erythema, negative warmth, negative signs of infection.Marland Kitchen Psychiatric:  Negative depression, negative anxiety, negative fatigue, negative mania  Neurologic:  Cranial nerves II through XII intact, tongue/uvula midline, all extremities muscle strength 5/5, sensation intact throughout,negative dysarthria, negative expressive aphasia, negative receptive aphasia.      Data Reviewed: Basic Metabolic Panel:  Recent Labs Lab 07/30/14 0358 07/31/14 0500 08/01/14 0043 08/02/14 0235 08/03/14 0251 08/04/14 0259 08/05/14 0211  NA 139 141 142 140 138 138 138  K 3.5 3.7 3.6 5.4* 4.1 4.0 3.9  CL 114* 118* 111 111 106 106 102  CO2 19* 19* 19* 16* 23 24 26   GLUCOSE 109* 88 225* 121* 78 87 95  BUN 19 13 14  23* 30* 29* 25*  CREATININE 0.99 0.94 1.02* 1.31* 1.42* 1.28* 1.19*  CALCIUM 6.8* 6.9* 8.1* 8.6* 8.6* 8.2* 8.5*  MG 2.1 1.8 1.7 1.6* 2.0 1.9 1.9  PHOS 1.9* 2.2* 3.6  --  2.8  --   --    Liver Function Tests: No results for input(s): AST, ALT, ALKPHOS,  BILITOT, PROT, ALBUMIN in the last 168 hours. No results for input(s): LIPASE, AMYLASE in the last 168 hours. No results for input(s): AMMONIA in the last 168 hours. CBC:  Recent Labs Lab 08/01/14 0043 08/02/14 0235 08/03/14 0251 08/04/14 0259 08/05/14 0211  WBC 14.6* 22.0* 15.1* 11.3* 10.4  HGB 10.0* 10.0* 9.2* 9.5* 10.2*  HCT 29.7* 29.8* 27.8* 29.1* 31.0*  MCV 88.7 90.6 91.7 90.1 90.9  PLT 282 274 289 323 448*   Cardiac Enzymes:  Recent Labs Lab 08/01/14 0043 08/01/14 0852 08/01/14 1556 08/01/14 2020  TROPONINI 0.43* 2.38* 2.06* 1.48*   BNP (last 3 results)  Recent Labs  08/01/14 1924  BNP >4500.0*    ProBNP (last 3 results) No results for input(s): PROBNP in the last 8760 hours.  CBG:  Recent Labs Lab 07/30/14 1938 07/31/14 0851 07/31/14 1157 07/31/14 1619 07/31/14 2144  GLUCAP 100* 94 78 77 79    Recent Results (from the past 240 hour(s))  Culture, blood (routine x 2)     Status: None   Collection Time: 07/28/14  7:25 AM  Result Value Ref Range Status   Specimen Description BLOOD LEFT ARM  Final   Special Requests BOTTLES DRAWN AEROBIC AND ANAEROBIC 5CC  Final   Culture NO GROWTH 5 DAYS  Final   Report Status 08/02/2014 FINAL  Final  Culture, blood (routine x 2)     Status: None   Collection Time: 07/28/14  7:35 AM  Result Value Ref Range Status   Specimen Description BLOOD RIGHT ANTECUBITAL  Final   Special Requests BOTTLES DRAWN AEROBIC AND ANAEROBIC 5CC  Final  Culture NO GROWTH 5 DAYS  Final   Report Status 08/02/2014 FINAL  Final  Urine culture     Status: None   Collection Time: 07/28/14 10:44 AM  Result Value Ref Range Status   Specimen Description URINE, CATHETERIZED  Final   Special Requests NONE  Final   Culture 40,000 COLONIES/ml PSEUDOMONAS AERUGINOSA  Final   Report Status 07/31/2014 FINAL  Final   Organism ID, Bacteria PSEUDOMONAS AERUGINOSA  Final      Susceptibility   Pseudomonas aeruginosa - MIC*    CEFTAZIDIME <=1  SENSITIVE Sensitive     CIPROFLOXACIN 0.5 SENSITIVE Sensitive     GENTAMICIN <=1 SENSITIVE Sensitive     IMIPENEM <=0.25 SENSITIVE Sensitive     PIP/TAZO <=4 SENSITIVE Sensitive     CEFEPIME <=1 SENSITIVE Sensitive     * 40,000 COLONIES/ml PSEUDOMONAS AERUGINOSA  MRSA PCR Screening     Status: None   Collection Time: 07/28/14 12:02 PM  Result Value Ref Range Status   MRSA by PCR NEGATIVE NEGATIVE Final    Comment:        The GeneXpert MRSA Assay (FDA approved for NASAL specimens only), is one component of a comprehensive MRSA colonization surveillance program. It is not intended to diagnose MRSA infection nor to guide or monitor treatment for MRSA infections.   Urine culture     Status: None   Collection Time: 07/28/14  3:27 PM  Result Value Ref Range Status   Specimen Description URINE, CATHETERIZED  Final   Special Requests NONE  Final   Culture NO GROWTH 1 DAY  Final   Report Status 07/29/2014 FINAL  Final  Culture, blood (x 2)     Status: None   Collection Time: 07/28/14  3:30 PM  Result Value Ref Range Status   Specimen Description BLOOD RIGHT NECK  Final   Special Requests BOTTLES DRAWN AEROBIC ONLY 10CC RIGHT IJ  Final   Culture NO GROWTH 5 DAYS  Final   Report Status 08/02/2014 FINAL  Final  Culture, blood (x 2)     Status: None   Collection Time: 07/28/14  3:40 PM  Result Value Ref Range Status   Specimen Description BLOOD LEFT A-LINE  Final   Special Requests BOTTLES DRAWN AEROBIC ONLY 10C  Final   Culture NO GROWTH 5 DAYS  Final   Report Status 08/02/2014 FINAL  Final  Culture, respiratory (NON-Expectorated)     Status: None   Collection Time: 07/28/14  4:35 PM  Result Value Ref Range Status   Specimen Description TRACHEAL ASPIRATE  Final   Special Requests Normal  Final   Gram Stain   Final    FEW WBC PRESENT,BOTH PMN AND MONONUCLEAR RARE SQUAMOUS EPITHELIAL CELLS PRESENT NO ORGANISMS SEEN Performed at [facility]    Culture   Final    NO  GROWTH 2 DAYS Performed at [facility]    Report Status 07/31/2014 FINAL  Final     Studies:  Recent x-ray studies have been reviewed in detail by the Attending Physician  Scheduled Meds:  Scheduled Meds: . antiseptic oral rinse  7 mL Mouth Rinse BID  . feeding supplement (ENSURE ENLIVE)  237 mL Oral BID BM  . heparin subcutaneous  5,000 Units Subcutaneous 3 times per day  . levothyroxine  50 mcg Oral QAC breakfast  . metoprolol tartrate  12.5 mg Oral BID  . pantoprazole  40 mg Oral Daily  . sodium chloride  3 mL Intravenous Q12H  Time spent on care of this patient: 40 mins   WOODS, Geraldo Docker , MD  [facility] Office  [phone] Pager [phone]  On-Call/Text Page:      [email or web address]      password [email or web address]  If 7PM-7AM, please contact night-coverage [email or web address] Password [email or web address] 08/05/2014, 11:32 AM   LOS: 9 days   Care during the described time interval was provided by me .  I have reviewed this patient's available data, including medical history, events of note, physical examination, and all test results as part of my evaluation. I have personally reviewed and interpreted all radiology studies.   Dia Crawford, MD [phone] Pager

## 2014-08-05 NOTE — Progress Notes (Signed)
Pt c/o SSCP 9/10 radiating to right jaw.  VSS, EKG obtained and MD notified.  New orders received.  Will continue to monitor. [place], Ardeth Sportsman

## 2014-08-05 NOTE — Progress Notes (Signed)
Patient has complete relief of SSCP and troponin 0.37 (trending down from last one drawn.)  Dr. Meda Coffee notified and gave OK for patient to be DC/d to SNF.  [facility] called and report given to [facility].  Awaiting transport via [place]. [place], Ardeth Sportsman

## 2014-08-05 NOTE — Progress Notes (Signed)
Patient for d/c today to SNF bed at [facility]. Daughter and patient agreeable to this plan- plan transfer via daughter's car-  Eduard Clos, MSW, [employer]

## 2014-08-07 ENCOUNTER — Non-Acute Institutional Stay (SKILLED_NURSING_FACILITY): Payer: Medicare Other | Admitting: Internal Medicine

## 2014-08-07 ENCOUNTER — Encounter: Payer: Self-pay | Admitting: Internal Medicine

## 2014-08-07 ENCOUNTER — Other Ambulatory Visit: Payer: Self-pay | Admitting: *Deleted

## 2014-08-07 DIAGNOSIS — F411 Generalized anxiety disorder: Secondary | ICD-10-CM

## 2014-08-07 DIAGNOSIS — R05 Cough: Secondary | ICD-10-CM | POA: Diagnosis not present

## 2014-08-07 DIAGNOSIS — D62 Acute posthemorrhagic anemia: Secondary | ICD-10-CM | POA: Diagnosis not present

## 2014-08-07 DIAGNOSIS — N183 Chronic kidney disease, stage 3 unspecified: Secondary | ICD-10-CM

## 2014-08-07 DIAGNOSIS — R5381 Other malaise: Secondary | ICD-10-CM

## 2014-08-07 DIAGNOSIS — M75102 Unspecified rotator cuff tear or rupture of left shoulder, not specified as traumatic: Secondary | ICD-10-CM

## 2014-08-07 DIAGNOSIS — I5181 Takotsubo syndrome: Secondary | ICD-10-CM

## 2014-08-07 DIAGNOSIS — M12812 Other specific arthropathies, not elsewhere classified, left shoulder: Secondary | ICD-10-CM

## 2014-08-07 DIAGNOSIS — M12512 Traumatic arthropathy, left shoulder: Secondary | ICD-10-CM | POA: Diagnosis not present

## 2014-08-07 DIAGNOSIS — R059 Cough, unspecified: Secondary | ICD-10-CM

## 2014-08-07 DIAGNOSIS — E039 Hypothyroidism, unspecified: Secondary | ICD-10-CM | POA: Diagnosis not present

## 2014-08-07 DIAGNOSIS — E038 Other specified hypothyroidism: Secondary | ICD-10-CM | POA: Diagnosis not present

## 2014-08-07 NOTE — Progress Notes (Signed)
Patient ID: JAYNELL CASTAGNOLA, female   DOB: [birth year], 81 y.o.   MRN: [id number]     [place]  PCP: Mayra Neer, MD  Code Status: Full Code   Allergies  Allergen Reactions  . Morphine And Related Rash  . Codeine Rash  . Blue Dyes (Parenteral) Itching  . Red Dye Itching    Chief Complaint  Patient presents with  . New Admit To SNF    New Admission      HPI:  81 y.o. patient is here for short term rehabilitation post hospital admission from 07/27/14-08/05/14 with left rotator cuff tear. She underwent reverse shoulder arthroplasty. Post op, she had acute respiratory failure with hypoxia, pulmonary edema and NSTEMI thought to be from stress induced cardiomyopathyShe also had UTI. She is seen in her room today with her son present. She complaints of constant dry cough bothering her. She is occasionally able to cough out some mucus. Pain medication keeping pain under control. Had bowel movement this am. Also has some indigestion feeling. No other concerns.  Review of Systems:  Constitutional: Negative for fever, chills, diaphoresis. positive for malaise HENT: Negative for headache, congestion, nasal discharge Eyes: Negative for eye pain, blurred vision, double vision and discharge.  Respiratory: Negative for shortness of breath and wheezing.   Cardiovascular: Negative for chest pain, palpitations, leg swelling.  Gastrointestinal: Negative for heartburn, nausea, vomiting, abdominal pain Genitourinary: Negative for dysuria Musculoskeletal: Negative for back pain, falls Skin: Negative for itching, rash.  Neurological: Negative for dizziness, tingling, focal weakness Psychiatric/Behavioral: Negative for depression    Past Medical History  Diagnosis Date  . Arthritis   . Thyroid disease   . Cancer     breast cancer   Past Surgical History  Procedure Laterality Date  . Abdominal hysterectomy    . Tonsillectomy    . Shoulder surgery Bilateral   . Back  surgery    . Bilateral carpal tunnel release    . Cholecystectomy    . Eye surgery Right   . Reverse shoulder arthroplasty Left 07/27/2014    Procedure: REVERSE SHOULDER ARTHROPLASTY;  Surgeon: Tania Ade, MD;  Location: [facility];  Service: Orthopedics;  Laterality: Left;  Left reverse total shoulder arthroplasty   Social History:   reports that she has never smoked. She has never used smokeless tobacco. She reports that she does not drink alcohol or use illicit drugs.  History reviewed. No pertinent family history.  Medications:   Medication List       This list is accurate as of: 08/07/14  2:39 PM.  Always use your most recent med list.               aspirin 325 MG buffered tablet  Take 325 mg by mouth daily.     bimatoprost 0.01 % Soln  Commonly known as:  LUMIGAN  Place 1 drop into both eyes every other day.     CALCIUM + D PO  Take 1 tablet by mouth daily.     diazepam 5 MG tablet  Commonly known as:  VALIUM  Take 5 mg by mouth every 6 (six) hours as needed for anxiety.     diclofenac sodium 1 % Gel  Commonly known as:  VOLTAREN  Apply 1 application topically 4 (four) times daily as needed.     fish oil-omega-3 fatty acids 1000 MG capsule  Take 1 g by mouth daily.     GLUCOSAMINE CHONDROITIN JOINT Tabs  Take 1 tablet by  mouth daily.     HYDROcodone-acetaminophen 5-325 MG per tablet  Commonly known as:  NORCO/VICODIN  1/2 tablet by mouth every 6 hours as needed for mild pain, 1 by mouth every 6 hours as needed for moderate pain     levothyroxine 50 MCG tablet  Commonly known as:  SYNTHROID, LEVOTHROID  Take 1 tablet by mouth daily.     multivitamin with minerals tablet  Take 1 tablet by mouth daily.     OVER THE COUNTER MEDICATION  Take 1 tablet by mouth daily. Brain & Liver Vitamin     PAIN RELIEVING RUB EX  Apply 1 application topically as needed (pain).         Physical Exam: Filed Vitals:   08/07/14 1435  BP: 120/58  Pulse: 72  Temp:  98.8 F (37.1 C)  TempSrc: Oral  Resp: 20  SpO2: 96%    General- elderly female, in no acute distress Head- normocephalic, atraumatic Throat- moist mucus membrane, clear oropharynx Eyes- PERRLA, EOMI, no pallor, no icterus, no discharge, normal conjunctiva, normal sclera Neck- no cervical lymphadenopathy, no jugular vein distension Cardiovascular- normal s1,s2, no murmurs, no leg edema Respiratory- bilateral clear to auscultation, no wheeze, no rhonchi, no crackles, no use of accessory muscles Abdomen- bowel sounds present, soft, non tender Musculoskeletal- able to move all 4 extremities, left arm in sling, able to move her digits, arthritis changes to her digits Neurological- no focal deficit, alert and oriented to person, place and time Skin- warm and dry, aquacel dressing to left shoulder in place Psychiatry- normal mood and affect    Labs reviewed: Basic Metabolic Panel:  Recent Labs  07/31/14 0500 08/01/14 0043  08/03/14 0251 08/04/14 0259 08/05/14 0211  NA 141 142  < > 138 138 138  K 3.7 3.6  < > 4.1 4.0 3.9  CL 118* 111  < > 106 106 102  CO2 19* 19*  < > 23 24 26   GLUCOSE 88 225*  < > 78 87 95  BUN 13 14  < > 30* 29* 25*  CREATININE 0.94 1.02*  < > 1.42* 1.28* 1.19*  CALCIUM 6.9* 8.1*  < > 8.6* 8.2* 8.5*  MG 1.8 1.7  < > 2.0 1.9 1.9  PHOS 2.2* 3.6  --  2.8  --   --   < > = values in this interval not displayed. Liver Function Tests:  Recent Labs  06/13/14 1327 07/19/14 1407 07/28/14 1532  AST 26 36 70*  ALT 14 19 14   ALKPHOS 49 47 28*  BILITOT 0.42 0.8 0.9  PROT 6.5 7.1 5.2*  ALBUMIN 3.5 4.1 3.0*   No results for input(s): LIPASE, AMYLASE in the last 8760 hours. No results for input(s): AMMONIA in the last 8760 hours. CBC:  Recent Labs  06/13/14 1327 07/19/14 1407  07/28/14 1055  08/03/14 0251 08/04/14 0259 08/05/14 0211  WBC 8.2 7.4  < > 17.4*  < > 15.1* 11.3* 10.4  NEUTROABS 5.0 4.3  --  14.5*  --   --   --   --   HGB 10.7* 10.8*  < >  7.6*  < > 9.2* 9.5* 10.2*  HCT 31.9* 32.1*  < > 22.4*  < > 27.8* 29.1* 31.0*  MCV 90.4 87.2  < > 89.2  < > 91.7 90.1 90.9  PLT 286 271  < > 229  < > 289 323 448*  < > = values in this interval not displayed. Cardiac Enzymes:  Recent Labs  08/01/14 1556 08/01/14 2020 08/05/14 1230  TROPONINI 2.06* 1.48* 0.37*   BNP: Invalid input(s): POCBNP CBG:  Recent Labs  07/31/14 1157 07/31/14 1619 07/31/14 2144  GLUCAP 78 77 79    Radiological Exams: Dg Chest 2 View  07/19/2014   CLINICAL DATA:  Preoperative exam prior left shoulder surgery, history of breast malignancy  EXAM: CHEST  2 VIEW  COMPARISON:  PA and lateral chest x-ray of October 07, 2012  FINDINGS: The lungs are hyperinflated with hemidiaphragm flattening. There is no focal infiltrate. There is no pleural effusion. The heart and pulmonary vascularity are normal. There is calcification in the wall of the aortic arch. There is mild tortuosity of the descending thoracic aorta. There is a moderate-sized hiatal hernia. There is degenerative disc disease at multiple thoracic levels. There are degenerative changes of both shoulders.  IMPRESSION: COPD.  There is no active cardiopulmonary disease.   Electronically Signed   By: David  Martinique M.D.   On: 07/19/2014 15:31     Assessment/Plan  Physical deconditioning Will have patient work with PT/OT as tolerated to regain strength and restore function.  Fall precautions are in place.   Left rotator cuff tear  S/P reverse shoulder arthroplasty. Has follow up with orthopedics. Continue norco 5-325 mg 0.5-1 tab q6h prn pain. Continue ca-vit d supplement.   Blood loss anemia Monitor h&h  Cough Clear oropharynx and clear lung exam. Add mucinex dm 600 mg 2 tab bid with duoneb q8h x 5 days and tehn prn and reassess. If no improvement get cxr  ckd stage 3 Monitor bmo  Stress induced cardiomyopathy Seen by cardiology in hospital, pending repeat echocardiogram as  outpatient  Hypothyroidism Continue synthroid 50 mcg daily and monitor  GAD Continue valium 5 mg q6h prn anxiety   Goals of care: short term rehabilitation   Labs/tests ordered: cbc with diff, cmp 08/10/14  Family/ staff Communication: reviewed care plan with patient and nursing supervisor    Blanchie Serve, MD  [facility] [phone] (Monday-Friday 8 am - 5 pm) [phone] (afterhours)

## 2014-08-09 ENCOUNTER — Other Ambulatory Visit: Payer: Self-pay | Admitting: *Deleted

## 2014-08-09 MED ORDER — HYDROCODONE-ACETAMINOPHEN 5-325 MG PO TABS: ORAL_TABLET | ORAL | Status: DC

## 2014-08-11 DIAGNOSIS — Z96612 Presence of left artificial shoulder joint: Secondary | ICD-10-CM | POA: Diagnosis not present

## 2014-08-11 DIAGNOSIS — Z471 Aftercare following joint replacement surgery: Secondary | ICD-10-CM | POA: Diagnosis not present

## 2014-08-14 ENCOUNTER — Non-Acute Institutional Stay (SKILLED_NURSING_FACILITY): Payer: Medicare Other | Admitting: Adult Health

## 2014-08-14 ENCOUNTER — Encounter: Payer: Self-pay | Admitting: Adult Health

## 2014-08-14 DIAGNOSIS — D509 Iron deficiency anemia, unspecified: Secondary | ICD-10-CM

## 2014-08-14 DIAGNOSIS — M12812 Other specific arthropathies, not elsewhere classified, left shoulder: Secondary | ICD-10-CM

## 2014-08-14 DIAGNOSIS — R5381 Other malaise: Secondary | ICD-10-CM | POA: Diagnosis not present

## 2014-08-14 DIAGNOSIS — N183 Chronic kidney disease, stage 3 unspecified: Secondary | ICD-10-CM

## 2014-08-14 DIAGNOSIS — Z96612 Presence of left artificial shoulder joint: Secondary | ICD-10-CM

## 2014-08-14 DIAGNOSIS — J449 Chronic obstructive pulmonary disease, unspecified: Secondary | ICD-10-CM

## 2014-08-14 DIAGNOSIS — M12512 Traumatic arthropathy, left shoulder: Secondary | ICD-10-CM

## 2014-08-14 DIAGNOSIS — F419 Anxiety disorder, unspecified: Secondary | ICD-10-CM

## 2014-08-14 DIAGNOSIS — E039 Hypothyroidism, unspecified: Secondary | ICD-10-CM

## 2014-08-14 DIAGNOSIS — K219 Gastro-esophageal reflux disease without esophagitis: Secondary | ICD-10-CM | POA: Diagnosis not present

## 2014-08-14 DIAGNOSIS — M75102 Unspecified rotator cuff tear or rupture of left shoulder, not specified as traumatic: Secondary | ICD-10-CM

## 2014-08-14 DIAGNOSIS — E43 Unspecified severe protein-calorie malnutrition: Secondary | ICD-10-CM | POA: Diagnosis not present

## 2014-08-14 NOTE — Progress Notes (Signed)
Patient ID: Andrea Gilmore, female   DOB: 21-Sep-1925, 81 y.o.   MRN: [id number]    DATE:  08/14/2014 MRN:  [id number]  BIRTHDAY: 14-Jul-1925  Facility:  Nursing Home Location:  [place] Room Number: 106-P  LEVEL OF CARE:  [phone]  Contact Information    Name Relation Home Work Jackson Daughter [phone]   [place], [place] [phone]  [phone]       Chief Complaint  Patient presents with  . Discharge Note    Physical deconditioning, left rotator cuff tear arthropathy S/P reverse shoulder arthroplasty, CKD stage III, Anemia, Hypothyroidism, GERD and COPD    HISTORY OF PRESENT ILLNESS:  This is an 81 year old female who is for discharge home and will have outpatient rehabilitation. DME:  Bedside commode. She has been admitted to [facility] on 08/05/14 from [facility]. She had a left rotator cuff tear arthropathy for which she had reverse shoulder arthroplasty. Her hospitalization stay was complicated. She was treated for sepsis, acute respiratory failure with hypoxia, pulmonary edema/effusion, ACS vs NSTEMI with heparin (not a cath candidate), UTI, CKD, IDA. BPS and HRs reviewed -  110/41 74 ; 130/66, 84  ; 125/62, 71 ; 120/58, 72 and 112/58, 64. No complaints of dizziness nor SOB.  Patient was admitted to this facility for short-term rehabilitation after the patient's recent hospitalization.  Patient has completed SNF rehabilitation and therapy has cleared the patient for discharge.  PAST MEDICAL HISTORY:  Past Medical History  Diagnosis Date  . Arthritis   . Thyroid disease   . Cancer     breast cancer    CURRENT MEDICATIONS: Reviewed     Medication List       This list is accurate as of: 08/14/14  6:35 PM.  Always use your most recent med list.               aspirin 325 MG buffered tablet  Take 325 mg by mouth daily.     bimatoprost 0.01 % Soln  Commonly known as:  LUMIGAN   Place 1 drop into both eyes every other day.     CALCIUM + D PO  Take 1 tablet by mouth daily.     diclofenac sodium 1 % Gel  Commonly known as:  VOLTAREN  Apply 1 application topically 4 (four) times daily as needed.     fish oil-omega-3 fatty acids 1000 MG capsule  Take 1 g by mouth daily.     GLUCOSAMINE CHONDROITIN JOINT Tabs  Take 1 tablet by mouth daily.     HYDROcodone-acetaminophen 5-325 MG per tablet  Commonly known as:  NORCO/VICODIN  Take 1 tablet by mouth every 6 (six) hours as needed for moderate pain. Take 1/2 tab PO Q 6 hours PRN     ipratropium-albuterol 0.5-2.5 (3) MG/3ML Soln  Commonly known as:  DUONEB  Take 3 mLs by nebulization every 8 (eight) hours as needed.     levothyroxine 50 MCG tablet  Commonly known as:  SYNTHROID, LEVOTHROID  Take 1 tablet by mouth daily.     LORazepam 0.5 MG tablet  Commonly known as:  ATIVAN  Take 0.5 mg by mouth every 6 (six) hours as needed for anxiety.     multivitamin with minerals tablet  Take 1 tablet by mouth daily.     PAIN RELIEVING RUB EX  Apply 1 application topically as needed (pain).     pantoprazole 20 MG tablet  Commonly known as:  PROTONIX  Take 20 mg by mouth daily.         Allergies  Allergen Reactions  . Morphine And Related Rash  . Codeine Rash  . Blue Dyes (Parenteral) Itching  . Red Dye Itching     REVIEW OF SYSTEMS:  GENERAL: no change in appetite, no fatigue, no weight changes, no fever, chills or weakness EYES: Denies change in vision, dry eyes, eye pain, itching or discharge EARS: Denies change in hearing, ringing in ears, or earache NOSE: Denies nasal congestion or epistaxis MOUTH and THROAT: Denies oral discomfort, gingival pain or bleeding, pain from teeth or hoarseness   RESPIRATORY: no cough, SOB, DOE, wheezing, hemoptysis CARDIAC: no chest pain, edema or palpitations GI: no abdominal pain, diarrhea, constipation, heart burn, nausea or vomiting GU: Denies dysuria,  frequency, hematuria, incontinence, or discharge PSYCHIATRIC: Denies feeling of depression or anxiety. No report of hallucinations, insomnia, paranoia, or agitation   PHYSICAL EXAMINATION  GENERAL APPEARANCE:  In no acute distress. SKIN:  Left shoulder surgical site is healed, no reness, dry HEAD: Normal in size and contour. No evidence of trauma EYES: Lids open and close normally. No blepharitis, entropion or ectropion. PERRL. Conjunctivae are clear and sclerae are white. Lenses are without opacity EARS: Pinnae are normal. Patient hears normal voice tunes of the examiner MOUTH and THROAT: Lips are without lesions. Oral mucosa is moist and without lesions. Tongue is normal in shape, size, and color and without lesions NECK: supple, trachea midline, no neck masses, no thyroid tenderness, no thyromegaly LYMPHATICS: no LAN in the neck, no supraclavicular LAN RESPIRATORY: breathing is even & unlabored, BS CTAB CARDIAC: RRR, no murmur,no extra heart sounds, no edema GI: abdomen soft, normal BS, no masses, no tenderness, no hepatomegaly, no splenomegaly EXTREMITIES:  Able to move X 4 extremities PSYCHIATRIC: Alert and oriented X 3. Affect and behavior are appropriate  LABS/RADIOLOGY: Labs reviewed: Basic Metabolic Panel:  Recent Labs  07/31/14 0500 08/01/14 0043  08/03/14 0251 08/04/14 0259 08/05/14 0211  NA 141 142  < > 138 138 138  K 3.7 3.6  < > 4.1 4.0 3.9  CL 118* 111  < > 106 106 102  CO2 19* 19*  < > 23 24 26   GLUCOSE 88 225*  < > 78 87 95  BUN 13 14  < > 30* 29* 25*  CREATININE 0.94 1.02*  < > 1.42* 1.28* 1.19*  CALCIUM 6.9* 8.1*  < > 8.6* 8.2* 8.5*  MG 1.8 1.7  < > 2.0 1.9 1.9  PHOS 2.2* 3.6  --  2.8  --   --   < > = values in this interval not displayed. Liver Function Tests:  Recent Labs  06/13/14 1327 07/19/14 1407 07/28/14 1532  AST 26 36 70*  ALT 14 19 14   ALKPHOS 49 47 28*  BILITOT 0.42 0.8 0.9  PROT 6.5 7.1 5.2*  ALBUMIN 3.5 4.1 3.0*   CBC:  Recent  Labs  06/13/14 1327 07/19/14 1407  07/28/14 1055  08/03/14 0251 08/04/14 0259 08/05/14 0211  WBC 8.2 7.4  < > 17.4*  < > 15.1* 11.3* 10.4  NEUTROABS 5.0 4.3  --  14.5*  --   --   --   --   HGB 10.7* 10.8*  < > 7.6*  < > 9.2* 9.5* 10.2*  HCT 31.9* 32.1*  < > 22.4*  < > 27.8* 29.1* 31.0*  MCV 90.4 87.2  < > 89.2  < > 91.7  90.1 90.9  PLT 286 271  < > 229  < > 289 323 448*  < > = values in this interval not displayed.  Cardiac Enzymes:  Recent Labs  08/01/14 1556 08/01/14 2020 08/05/14 1230  TROPONINI 2.06* 1.48* 0.37*   CBG:  Recent Labs  07/31/14 1157 07/31/14 1619 07/31/14 2144  GLUCAP 78 77 79     Dg Chest 2 View  07/19/2014   CLINICAL DATA:  Preoperative exam prior left shoulder surgery, history of breast malignancy  EXAM: CHEST  2 VIEW  COMPARISON:  PA and lateral chest x-ray of October 07, 2012  FINDINGS: The lungs are hyperinflated with hemidiaphragm flattening. There is no focal infiltrate. There is no pleural effusion. The heart and pulmonary vascularity are normal. There is calcification in the wall of the aortic arch. There is mild tortuosity of the descending thoracic aorta. There is a moderate-sized hiatal hernia. There is degenerative disc disease at multiple thoracic levels. There are degenerative changes of both shoulders.  IMPRESSION: COPD.  There is no active cardiopulmonary disease.   Electronically Signed   By: David  Martinique M.D.   On: 07/19/2014 15:31   Ct Head Wo Contrast  07/28/2014   CLINICAL DATA:  81 year old who underwent left shoulder arthroplasty yesterday, now with acute delirium.  EXAM: CT HEAD WITHOUT CONTRAST  TECHNIQUE: Contiguous axial images were obtained from the base of the skull through the vertex without intravenous contrast.  COMPARISON:  CT head 05/01/2014, 10/21/2010.  MRI brain 10/21/2010.  FINDINGS: Ventricular system normal in size and appearance for age. Mild to moderate cortical and deep atrophy and moderate cerebellar atrophy,  progressive since 2012. Mild changes of small vessel disease of the white matter diffusely, also progressive. Physiologic calcifications in the basal ganglia, unchanged. No mass lesion. No midline shift. No acute hemorrhage or hematoma. No extra-axial fluid collections. No evidence of acute infarction.  Mild changes of hyperostosis frontalis interna. Visualized paranasal sinuses, bilateral mastoid air cells and bilateral middle ear cavities well-aerated. Bilateral carotid siphon and vertebral artery atherosclerosis.  IMPRESSION: 1. No acute intracranial abnormality. 2. Mild to moderate generalized atrophy and mild chronic microvascular ischemic changes of the white matter.   Electronically Signed   By: Evangeline Dakin M.D.   On: 07/28/2014 22:37   Ct Angio Chest Pe W/cm &/or Wo Cm  07/28/2014   CLINICAL DATA:  Acute onset of hypoxia. Recent left shoulder surgery. Evaluate for pulmonary embolus. Initial encounter.  EXAM: CT ANGIOGRAPHY CHEST WITH CONTRAST  TECHNIQUE: Multidetector CT imaging of the chest was performed using the standard protocol during bolus administration of intravenous contrast. Multiplanar CT image reconstructions and MIPs were obtained to evaluate the vascular anatomy.  CONTRAST:  58mL OMNIPAQUE IOHEXOL 350 MG/ML SOLN  COMPARISON:  Chest radiograph performed earlier today at 3:08 p.m.  FINDINGS: There is no evidence of pulmonary embolus.  Trace bilateral pleural fluid is noted, with minimal associated atelectasis. There is no evidence of pneumothorax. No masses are identified; no abnormal focal contrast enhancement is seen.  A moderate hiatal hernia is noted, filled with fluid. The patient's endotracheal tube is seen ending 1-2 cm above the carina. Scattered coronary artery calcifications are seen. The mediastinum is otherwise unremarkable. No mediastinal lymphadenopathy is seen. No pericardial effusion is identified. The great vessels are grossly unremarkable in appearance. Scattered  calcification is noted along the aortic arch and descending thoracic aorta.  Postoperative change is noted about the left shoulder, with scattered soft tissue air and diffuse soft tissue  inflammation tracking along the proximal left arm. No axillary lymphadenopathy is seen. The visualized portions of the thyroid gland are unremarkable in appearance.  The visualized portions of the liver and spleen are unremarkable. The patient is status post cholecystectomy, with clips noted along the gallbladder fossa. The visualized portions of the pancreas, adrenal glands and kidneys are grossly unremarkable. Scattered vascular calcifications are seen at the renal hila bilaterally.  No acute osseous abnormalities are seen. A left shoulder arthroplasty is grossly unremarkable in appearance, though incompletely imaged. Endplate sclerotic change is noted at T11-T12.  Review of the MIP images confirms the above findings.  IMPRESSION: 1. No evidence of pulmonary nodules. 2. Endotracheal tube seen ending 1-2 cm above the carina. This could be retracted 1-2 cm. 3. Trace bilateral pleural fluid, with minimal associated atelectasis. 4. Moderate hiatal hernia, filled with fluid. 5. Postoperative change about the left shoulder, with scattered soft tissue air and diffuse soft tissue inflammation tracking along the proximal left arm. The visualized portions of the patient's left shoulder arthroplasty appear grossly intact, without evidence of loosening. 6. Scattered calcification along the aortic arch and distal thoracic aorta, and scattered vascular calcifications at the renal hila bilaterally.   Electronically Signed   By: Garald Balding M.D.   On: 07/28/2014 22:39   Dg Chest Port 1 View  08/03/2014   CLINICAL DATA:  Pulmonary edema  EXAM: PORTABLE CHEST - 1 VIEW  COMPARISON:  07/31/2014  FINDINGS: Cardiac shadow is at the upper limits of normal in size but stable. Vascular congestion and pulmonary edema is noted although slightly  improved when compared with the prior exam. Increasing early infiltrate is noted in the left lung base. A small left pleural effusion is noted as well. No acute bony abnormality is seen.  IMPRESSION: Vascular congestion and pulmonary edema although slightly improved when compare with the prior exam.  Increasing left basilar infiltrate.   Electronically Signed   By: Inez Catalina M.D.   On: 08/03/2014 07:28   Dg Chest Port 1 View  08/01/2014   CLINICAL DATA:  Respiratory failure.  Shortness of breath.  EXAM: PORTABLE CHEST - 1 VIEW  COMPARISON:  07/30/2014 at 0538 hour  FINDINGS: Endotracheal tube, enteric tube, and left central line have been removed. Lower lung volumes from prior exam. Cardiomegaly is unchanged allowing for differences in technique. Development of mixed interstitial alveolar perihilar opacities, right greater than left. Progressive blunting of costophrenic angle suspicious for pleural effusions. No pneumothorax. Osseous structures are unchanged.  IMPRESSION: Development perihilar opacities concerning for pulmonary edema and probable pleural effusions. Findings consistent with CHF. Aspiration or less likely pneumonia could have a similar appearance.   Electronically Signed   By: Jeb Levering M.D.   On: 08/01/2014 00:25   Dg Chest Port 1 View  07/30/2014   CLINICAL DATA:  Ventilator dependent respiratory failure. Three days postop left shoulder arthroplasty.  EXAM: PORTABLE CHEST - 1 VIEW  COMPARISON:  07/29/2014 and earlier, including CTA chest 07/28/2014.  FINDINGS: Endotracheal tube tip in satisfactory position approximately 4 cm above the carina. Left jugular central venous catheter tip projects over the upper SVC, unchanged. Nasogastric tube courses below the diaphragm into the stomach. Cardiac silhouette mildly enlarged but stable. Mild atelectasis at the left lung base and small left pleural effusion, slightly improved since yesterday. Lungs otherwise clear. Left shoulder arthroplasty  with anatomic alignment. Severe degenerative changes in right shoulder.  IMPRESSION: 1. Support apparatus satisfactory. 2. Mild atelectasis at the left lung base  and small left pleural effusion, improved since yesterday. 3. No new abnormalities.   Electronically Signed   By: Evangeline Dakin M.D.   On: 07/30/2014 09:22   Dg Chest Port 1 View  07/29/2014   CLINICAL DATA:  Check endotracheal tube placement  EXAM: PORTABLE CHEST - 1 VIEW  COMPARISON:  07/28/2014  FINDINGS: Cardiac shadow is stable. A left central venous line is again noted and stable. Endotracheal tube is been withdrawn and now lies approximately 5.5 cm above the carina. The lungs are well aerated bilaterally with mild interstitial changes. A small left pleural effusion is again seen. Postsurgical changes in left shoulder are seen.  IMPRESSION: Interval withdrawal of the endotracheal tube. No new focal abnormality is noted.   Electronically Signed   By: Inez Catalina M.D.   On: 07/29/2014 08:14   Dg Chest Port 1 View  07/28/2014   CLINICAL DATA:  Central line placement.  EXAM: PORTABLE CHEST - 1 VIEW  COMPARISON:  Chest x-ray dated earlier same day.  FINDINGS: There has been interval placement of a left-sided central line with tip in the upper superior vena cava. Endotracheal tube has also been placed with tip slightly into the right mainstem bronchus. Cardiomediastinal silhouette is stable in size and configuration.  Suspect trace left pleural effusion. Lungs otherwise clear. No pneumothorax seen. The left shoulder replacement hardware is stable in position. No acute osseous abnormality seen.  A hiatal hernia is better appreciated on the earlier chest x-ray. Again noted is gaseous distention of the stomach.  IMPRESSION: 1. Endotracheal tube tip projects slightly into the right mainstem bronchus. Recommend retracting approximately 2 cm for optimal radiographic positioning. 2. Left-sided internal jugular central line in place with tip at the upper  margin of the SVC. 3. No pneumothorax. 4. Suspect trace left pleural effusion versus mild left basilar atelectasis. Lungs otherwise clear.  These results will be called to the ordering clinician or representative by the Radiologist Assistant, and communication documented in the PACS or zVision Dashboard.   Electronically Signed   By: Franki Cabot M.D.   On: 07/28/2014 15:25   Dg Chest Port 1 View  07/28/2014   CLINICAL DATA:  Tachycardia.  Postop left shoulder replacement.  EXAM: PORTABLE CHEST - 1 VIEW  COMPARISON:  07/19/2014  FINDINGS: The patient has undergone left shoulder replacement. The heart size is accentuated by technique. Probable left-sided pleural effusion noted. Large hiatal hernia noted. Right lung is clear. No pulmonary edema. Surgical clips overlie the left axillary region. There is gaseous distension of the stomach.  IMPRESSION: 1. Suspect left pleural effusion. 2. Hiatal hernia.  Gaseous distension of the stomach.   Electronically Signed   By: Nolon Nations M.D.   On: 07/28/2014 09:02   Dg Shoulder Left Port  07/28/2014   CLINICAL DATA:  Pain  EXAM: LEFT SHOULDER - 1 VIEW  COMPARISON:  July 27, 2014  FINDINGS: Frontal view obtained. There is a total shoulder replacement prosthesis on the left with alignment anatomic on this single view. No fracture or dislocation apparent. There is evidence of old trauma involving the lateral left clavicle with widening between the clavicle and acromion, stable. No coracoclavicular separation seen on this study.  IMPRESSION: No change in total shoulder prosthesis alignment on frontal view. No acute fracture or dislocation. Stable widening between the lateral left clavicle and acromion with evidence of old trauma involving the lateral left clavicle.   Electronically Signed   By: Lowella Grip III M.D.  On: 07/28/2014 15:19   Dg Shoulder Left Port  07/27/2014   CLINICAL DATA:  Postop  EXAM: LEFT SHOULDER - 1 VIEW  COMPARISON:  07/14/2014   FINDINGS: Left total shoulder arthroplasty is anatomically aligned. There is no breakage or loosening of the hardware. Gas in the soft tissues is consistent with recent surgery. Loose body above the acetabular component is stable.  IMPRESSION: Left total shoulder arthroplasty anatomically aligned.   Electronically Signed   By: Marybelle Killings M.D.   On: 07/27/2014 10:04   Dg Abd Portable 1v  07/29/2014   CLINICAL DATA:  81 year old female status post enteric tube placement. Check position.  EXAM: PORTABLE ABDOMEN - 1 VIEW  COMPARISON:  Earlier Radiograph dated 07/28/2014  FINDINGS: There has been interval placement of an enteric tube with tip in the right hemi abdomen likely in the distal stomach. There is no evidence of bowel obstruction. No free air. Moderate stool noted throughout the colon. Excreted contrast noted within the renal collecting systems bilaterally. Cholecystectomy clips. L4-L5 disc spacer and posterior fixation hardware. Degenerative changes of the spine.  IMPRESSION: Enteric tube with tip in the right abdomen likely in the distal stomach. No evidence of bowel obstruction.   Electronically Signed   By: Anner Crete M.D.   On: 07/29/2014 01:11   Dg Abd Portable 1v  07/28/2014   CLINICAL DATA:  Nasogastric tube placement  EXAM: PORTABLE ABDOMEN - 1 VIEW  COMPARISON:  Portable exam 1652 hours compared to 08/31/2012 lumbar spine radiographs  FINDINGS: Tip of endotracheal tube projects 2.7 cm above carina.  Tip of nasogastric tube is in the mid thorax ; recommend advancing tube 18 cm to place proximal side-port within the proximal stomach.  LEFT jugular central venous catheter tip projecting over SVC.  Normal heart size with calcification and tortuosity of thoracic aorta.  Lungs appear emphysematous without gross infiltrate or pleural effusion.  Minimal atelectasis LEFT base.  Prominent bowel gas and stool in colon in the upper abdomen.  Gaseous distention of stomach.  Prior lumbosacral fusion.   Bones demineralized.  IMPRESSION: Tip of nasogastric tube is in the mid thoracic esophagus; recommend advancing tube 18 cm to place proximal side-port within the proximal stomach.  Findings called to [place] on [place] on 07/28/2014 at 1706 hours.   Electronically Signed   By: Lavonia Dana M.D.   On: 07/28/2014 17:07   Ct Extrem Up Entire Arm L Wo/cm  07/28/2014   CLINICAL DATA:  81 year old female with left shoulder reverse arthroplasty. Evaluate for hematoma  EXAM: CT OF THE UPPER LEFT ARM WITHOUT CONTRAST  TECHNIQUE: Multidetector CT imaging was performed according to the standard protocol. Multiplanar CT image reconstructions were also generated.  COMPARISON:  Radiograph dated 07/28/2014 and CT dated 07/14/2014  FINDINGS: Evaluation very limited due to streak artifact caused by metallic arthroplasty.  There is a total left shoulder reverse arthroplasty. There is no evidence of dislocation. There is extensive osteoarthritic changes of the proximal humerus. No acute fracture. Multiple small bone fragments superior to the arthroplasty are similar to prior study and may represent ectopic bone formation or loose bodies. There is diffuse soft tissue edema with small fluid surrounding the joint. No evidence of large hematoma. There is postsurgical changes of the soft tissues in the anterior upper extremity with small pockets of soft tissue air. There is diffuse subcutaneous soft tissue stranding.  A small left pleural effusion may be present.  IMPRESSION: Postsurgical changes of left shoulder reverse arthroplasty. No significant  fluid collection or hematoma identified.   Electronically Signed   By: Anner Crete M.D.   On: 07/28/2014 22:46    ASSESSMENT/PLAN:  Physical deconditioning - for outpatient rehabilitation  Left rotator cuff tear S/P reverse shoulder arthroplasty - for outpatient rehabilitation. Light use of LUE, gentle active assist ROM and sling PRN for comfort; decrease Norco 5/325 mg 1/2  tab  PO Q 6 hours PRN for pain; and ASA 325 mg 1 tab PO Q D for DVT proiphylaxis  Chronic Kidney Disease stage III - creatinine 1.19; check BMP; BP/HR TID  Anemia, iron deficiency - hgb 10.2; check CBC  Hypothyroidism - tsh 3.19; continue Synthroid 50 mcg PO Q D  GERD - recently started on Protonix 20 mg 1 tab PO daily  COPD - continue Duoneb 1 neb Q 8 hours PRN  Anxiety - discontinue Valium per pharmacy recommendation and start Ativan 0.5 mg 1 tab PO Q 6 HR PRN  Proetein-calorie malnutrition, severe - albumin 3.0; continue supplementation    I have filled out patient's discharge paperwork and written prescriptions.  Patient will have outpatient rehabilitation..  DME provided:  Bedside commode  Total discharge time: Greater than 30 minutes  Discharge time involved coordination of the discharge process with social worker, nursing staff and therapy department. Medical justification for DME verified.    [facility], NP [employer] [phone]

## 2014-08-16 ENCOUNTER — Non-Acute Institutional Stay (SKILLED_NURSING_FACILITY): Payer: Medicare Other | Admitting: Adult Health

## 2014-08-16 ENCOUNTER — Encounter: Payer: Self-pay | Admitting: Adult Health

## 2014-08-16 DIAGNOSIS — M25512 Pain in left shoulder: Secondary | ICD-10-CM | POA: Diagnosis not present

## 2014-08-16 DIAGNOSIS — E875 Hyperkalemia: Secondary | ICD-10-CM

## 2014-08-16 DIAGNOSIS — M25511 Pain in right shoulder: Secondary | ICD-10-CM

## 2014-08-16 NOTE — Progress Notes (Signed)
Patient ID: Andrea Gilmore, female   DOB: 1925/01/11, 81 y.o.   MRN: [id number]    DATE:  08/16/2014 MRN:  [id number]  BIRTHDAY: 1925-04-09  Facility:  Nursing Home Location:  [place] Room Number: 106-P  LEVEL OF CARE:  SNF [phone])      Contact Information    Name Relation Home Work [place] Daughter [phone]   Nancy[place], [place] [phone]  [phone]       Chief Complaint  Patient presents with  . Acute Visit    Hyperkalemia and Bilateral shoulder pain    HISTORY OF PRESENT ILLNESS:  This is an 82 year old female who is for discharge home  On 08/18/14. It was noted tha K is elevated 5.5. She had a recent reverse left shoulder arthroplasty but had complications during her hospitalization. She said that her right shoulder is "bad" too and needing surgery but will not have surgery at this time due to complications while in the hospital. Her Norco dosage was recently cut to 1/2 tab and she is now requesting for it to be increased ti 1 tablet.  PAST MEDICAL HISTORY:  Past Medical History  Diagnosis Date  . Arthritis   . Thyroid disease   . Cancer     breast cancer    CURRENT MEDICATIONS: Reviewed     Medication List       This list is accurate as of: 08/16/14  6:32 PM.  Always use your most recent med list.               aspirin 325 MG buffered tablet  Take 325 mg by mouth daily.     bimatoprost 0.01 % Soln  Commonly known as:  LUMIGAN  Place 1 drop into both eyes every other day.     CALCIUM + D PO  Take 1 tablet by mouth daily.     diclofenac sodium 1 % Gel  Commonly known as:  VOLTAREN  Apply 1 application topically 4 (four) times daily as needed.     fish oil-omega-3 fatty acids 1000 MG capsule  Take 1 g by mouth daily.     GLUCOSAMINE CHONDROITIN JOINT Tabs  Take 1 tablet by mouth daily.     HYDROcodone-acetaminophen 5-325 MG per tablet  Commonly known as:   NORCO/VICODIN  Take 1 tablet by mouth every 6 (six) hours as needed for moderate pain.     ipratropium-albuterol 0.5-2.5 (3) MG/3ML Soln  Commonly known as:  DUONEB  Take 3 mLs by nebulization every 8 (eight) hours as needed.     levothyroxine 50 MCG tablet  Commonly known as:  SYNTHROID, LEVOTHROID  Take 1 tablet by mouth daily.     LORazepam 0.5 MG tablet  Commonly known as:  ATIVAN  Take 0.5 mg by mouth every 6 (six) hours as needed for anxiety.     multivitamin with minerals tablet  Take 1 tablet by mouth daily.     PAIN RELIEVING RUB EX  Apply 1 application topically as needed (pain).     pantoprazole 20 MG tablet  Commonly known as:  PROTONIX  Take 20 mg by mouth daily.         Allergies  Allergen Reactions  . Morphine And Related Rash  . Codeine Rash  . Blue Dyes (Parenteral) Itching  . Red Dye Itching     REVIEW OF SYSTEMS:  GENERAL: no change in appetite, no fatigue, no weight changes, no fever,  chills or weakness EYES: Denies change in vision, dry eyes, eye pain, itching or discharge EARS: Denies change in hearing, ringing in ears, or earache NOSE: Denies nasal congestion or epistaxis MOUTH and THROAT: Denies oral discomfort, gingival pain or bleeding, pain from teeth or hoarseness   RESPIRATORY: no cough, SOB, DOE, wheezing, hemoptysis CARDIAC: no chest pain, edema or palpitations GI: no abdominal pain, diarrhea, constipation, heart burn, nausea or vomiting GU: Denies dysuria, frequency, hematuria, incontinence, or discharge PSYCHIATRIC: Denies feeling of depression or anxiety. No report of hallucinations, insomnia, paranoia, or agitation   PHYSICAL EXAMINATION  GENERAL APPEARANCE:  In no acute distress. SKIN:  Left shoulder surgical site is healed, no redness, dry HEAD: Normal in size and contour. No evidence of trauma EYES: Lids open and close normally. No blepharitis, entropion or ectropion. PERRL. Conjunctivae are clear and sclerae are white.  Lenses are without opacity EARS: Pinnae are normal. Patient hears normal voice tunes of the examiner MOUTH and THROAT: Lips are without lesions. Oral mucosa is moist and without lesions. Tongue is normal in shape, size, and color and without lesions NECK: supple, trachea midline, no neck masses, no thyroid tenderness, no thyromegaly LYMPHATICS: no LAN in the neck, no supraclavicular LAN RESPIRATORY: breathing is even & unlabored, BS CTAB CARDIAC: RRR, no murmur,no extra heart sounds, no edema GI: abdomen soft, normal BS, no masses, no tenderness, no hepatomegaly, no splenomegaly EXTREMITIES:  Able to move X 4 extremities; limited ROM bilateral shoulders due to pain; LUE on sling PSYCHIATRIC: Alert and oriented X 3. Affect and behavior are appropriate  LABS/RADIOLOGY: Labs reviewed: 08/15/14  Wbc 6.0  hgb 11.0  hct 34.4  mcv 89.6  NA 141  K 5.5  CO2 22  Glucose 73  BUN 16  Creatinine 0.88  Calcium 8.9 Basic Metabolic Panel:  Recent Labs  07/31/14 0500 08/01/14 0043  08/03/14 0251 08/04/14 0259 08/05/14 0211  NA 141 142  < > 138 138 138  K 3.7 3.6  < > 4.1 4.0 3.9  CL 118* 111  < > 106 106 102  CO2 19* 19*  < > 23 24 26   GLUCOSE 88 225*  < > 78 87 95  BUN 13 14  < > 30* 29* 25*  CREATININE 0.94 1.02*  < > 1.42* 1.28* 1.19*  CALCIUM 6.9* 8.1*  < > 8.6* 8.2* 8.5*  MG 1.8 1.7  < > 2.0 1.9 1.9  PHOS 2.2* 3.6  --  2.8  --   --   < > = values in this interval not displayed. Liver Function Tests:  Recent Labs  06/13/14 1327 07/19/14 1407 07/28/14 1532  AST 26 36 70*  ALT 14 19 14   ALKPHOS 49 47 28*  BILITOT 0.42 0.8 0.9  PROT 6.5 7.1 5.2*  ALBUMIN 3.5 4.1 3.0*   CBC:  Recent Labs  06/13/14 1327 07/19/14 1407  07/28/14 1055  08/03/14 0251 08/04/14 0259 08/05/14 0211  WBC 8.2 7.4  < > 17.4*  < > 15.1* 11.3* 10.4  NEUTROABS 5.0 4.3  --  14.5*  --   --   --   --   HGB 10.7* 10.8*  < > 7.6*  < > 9.2* 9.5* 10.2*  HCT 31.9* 32.1*  < > 22.4*  < > 27.8* 29.1* 31.0*  MCV  90.4 87.2  < > 89.2  < > 91.7 90.1 90.9  PLT 286 271  < > 229  < > 289 323 448*  < > = values  in this interval not displayed.  Cardiac Enzymes:  Recent Labs  08/01/14 1556 08/01/14 2020 08/05/14 1230  TROPONINI 2.06* 1.48* 0.37*   CBG:  Recent Labs  07/31/14 1157 07/31/14 1619 07/31/14 2144  GLUCAP 78 77 79     Dg Chest 2 View  07/19/2014   CLINICAL DATA:  Preoperative exam prior left shoulder surgery, history of breast malignancy  EXAM: CHEST  2 VIEW  COMPARISON:  PA and lateral chest x-ray of October 07, 2012  FINDINGS: The lungs are hyperinflated with hemidiaphragm flattening. There is no focal infiltrate. There is no pleural effusion. The heart and pulmonary vascularity are normal. There is calcification in the wall of the aortic arch. There is mild tortuosity of the descending thoracic aorta. There is a moderate-sized hiatal hernia. There is degenerative disc disease at multiple thoracic levels. There are degenerative changes of both shoulders.  IMPRESSION: COPD.  There is no active cardiopulmonary disease.   Electronically Signed   By: David  Martinique M.D.   On: 07/19/2014 15:31   Ct Head Wo Contrast  07/28/2014   CLINICAL DATA:  81 year old who underwent left shoulder arthroplasty yesterday, now with acute delirium.  EXAM: CT HEAD WITHOUT CONTRAST  TECHNIQUE: Contiguous axial images were obtained from the base of the skull through the vertex without intravenous contrast.  COMPARISON:  CT head 05/01/2014, 10/21/2010.  MRI brain 10/21/2010.  FINDINGS: Ventricular system normal in size and appearance for age. Mild to moderate cortical and deep atrophy and moderate cerebellar atrophy, progressive since 2012. Mild changes of small vessel disease of the white matter diffusely, also progressive. Physiologic calcifications in the basal ganglia, unchanged. No mass lesion. No midline shift. No acute hemorrhage or hematoma. No extra-axial fluid collections. No evidence of acute infarction.  Mild  changes of hyperostosis frontalis interna. Visualized paranasal sinuses, bilateral mastoid air cells and bilateral middle ear cavities well-aerated. Bilateral carotid siphon and vertebral artery atherosclerosis.  IMPRESSION: 1. No acute intracranial abnormality. 2. Mild to moderate generalized atrophy and mild chronic microvascular ischemic changes of the white matter.   Electronically Signed   By: Evangeline Dakin M.D.   On: 07/28/2014 22:37   Ct Angio Chest Pe W/cm &/or Wo Cm  07/28/2014   CLINICAL DATA:  Acute onset of hypoxia. Recent left shoulder surgery. Evaluate for pulmonary embolus. Initial encounter.  EXAM: CT ANGIOGRAPHY CHEST WITH CONTRAST  TECHNIQUE: Multidetector CT imaging of the chest was performed using the standard protocol during bolus administration of intravenous contrast. Multiplanar CT image reconstructions and MIPs were obtained to evaluate the vascular anatomy.  CONTRAST:  88mL OMNIPAQUE IOHEXOL 350 MG/ML SOLN  COMPARISON:  Chest radiograph performed earlier today at 3:08 p.m.  FINDINGS: There is no evidence of pulmonary embolus.  Trace bilateral pleural fluid is noted, with minimal associated atelectasis. There is no evidence of pneumothorax. No masses are identified; no abnormal focal contrast enhancement is seen.  A moderate hiatal hernia is noted, filled with fluid. The patient's endotracheal tube is seen ending 1-2 cm above the carina. Scattered coronary artery calcifications are seen. The mediastinum is otherwise unremarkable. No mediastinal lymphadenopathy is seen. No pericardial effusion is identified. The great vessels are grossly unremarkable in appearance. Scattered calcification is noted along the aortic arch and descending thoracic aorta.  Postoperative change is noted about the left shoulder, with scattered soft tissue air and diffuse soft tissue inflammation tracking along the proximal left arm. No axillary lymphadenopathy is seen. The visualized portions of the thyroid  gland are unremarkable  in appearance.  The visualized portions of the liver and spleen are unremarkable. The patient is status post cholecystectomy, with clips noted along the gallbladder fossa. The visualized portions of the pancreas, adrenal glands and kidneys are grossly unremarkable. Scattered vascular calcifications are seen at the renal hila bilaterally.  No acute osseous abnormalities are seen. A left shoulder arthroplasty is grossly unremarkable in appearance, though incompletely imaged. Endplate sclerotic change is noted at T11-T12.  Review of the MIP images confirms the above findings.  IMPRESSION: 1. No evidence of pulmonary nodules. 2. Endotracheal tube seen ending 1-2 cm above the carina. This could be retracted 1-2 cm. 3. Trace bilateral pleural fluid, with minimal associated atelectasis. 4. Moderate hiatal hernia, filled with fluid. 5. Postoperative change about the left shoulder, with scattered soft tissue air and diffuse soft tissue inflammation tracking along the proximal left arm. The visualized portions of the patient's left shoulder arthroplasty appear grossly intact, without evidence of loosening. 6. Scattered calcification along the aortic arch and distal thoracic aorta, and scattered vascular calcifications at the renal hila bilaterally.   Electronically Signed   By: Garald Balding M.D.   On: 07/28/2014 22:39   Dg Chest Port 1 View  08/03/2014   CLINICAL DATA:  Pulmonary edema  EXAM: PORTABLE CHEST - 1 VIEW  COMPARISON:  07/31/2014  FINDINGS: Cardiac shadow is at the upper limits of normal in size but stable. Vascular congestion and pulmonary edema is noted although slightly improved when compared with the prior exam. Increasing early infiltrate is noted in the left lung base. A small left pleural effusion is noted as well. No acute bony abnormality is seen.  IMPRESSION: Vascular congestion and pulmonary edema although slightly improved when compare with the prior exam.  Increasing left  basilar infiltrate.   Electronically Signed   By: Inez Catalina M.D.   On: 08/03/2014 07:28   Dg Chest Port 1 View  08/01/2014   CLINICAL DATA:  Respiratory failure.  Shortness of breath.  EXAM: PORTABLE CHEST - 1 VIEW  COMPARISON:  07/30/2014 at 0538 hour  FINDINGS: Endotracheal tube, enteric tube, and left central line have been removed. Lower lung volumes from prior exam. Cardiomegaly is unchanged allowing for differences in technique. Development of mixed interstitial alveolar perihilar opacities, right greater than left. Progressive blunting of costophrenic angle suspicious for pleural effusions. No pneumothorax. Osseous structures are unchanged.  IMPRESSION: Development perihilar opacities concerning for pulmonary edema and probable pleural effusions. Findings consistent with CHF. Aspiration or less likely pneumonia could have a similar appearance.   Electronically Signed   By: Jeb Levering M.D.   On: 08/01/2014 00:25   Dg Chest Port 1 View  07/30/2014   CLINICAL DATA:  Ventilator dependent respiratory failure. Three days postop left shoulder arthroplasty.  EXAM: PORTABLE CHEST - 1 VIEW  COMPARISON:  07/29/2014 and earlier, including CTA chest 07/28/2014.  FINDINGS: Endotracheal tube tip in satisfactory position approximately 4 cm above the carina. Left jugular central venous catheter tip projects over the upper SVC, unchanged. Nasogastric tube courses below the diaphragm into the stomach. Cardiac silhouette mildly enlarged but stable. Mild atelectasis at the left lung base and small left pleural effusion, slightly improved since yesterday. Lungs otherwise clear. Left shoulder arthroplasty with anatomic alignment. Severe degenerative changes in right shoulder.  IMPRESSION: 1. Support apparatus satisfactory. 2. Mild atelectasis at the left lung base and small left pleural effusion, improved since yesterday. 3. No new abnormalities.   Electronically Signed   By: Evangeline Dakin  M.D.   On: 07/30/2014  09:22   Dg Chest Port 1 View  07/29/2014   CLINICAL DATA:  Check endotracheal tube placement  EXAM: PORTABLE CHEST - 1 VIEW  COMPARISON:  07/28/2014  FINDINGS: Cardiac shadow is stable. A left central venous line is again noted and stable. Endotracheal tube is been withdrawn and now lies approximately 5.5 cm above the carina. The lungs are well aerated bilaterally with mild interstitial changes. A small left pleural effusion is again seen. Postsurgical changes in left shoulder are seen.  IMPRESSION: Interval withdrawal of the endotracheal tube. No new focal abnormality is noted.   Electronically Signed   By: Inez Catalina M.D.   On: 07/29/2014 08:14   Dg Chest Port 1 View  07/28/2014   CLINICAL DATA:  Central line placement.  EXAM: PORTABLE CHEST - 1 VIEW  COMPARISON:  Chest x-ray dated earlier same day.  FINDINGS: There has been interval placement of a left-sided central line with tip in the upper superior vena cava. Endotracheal tube has also been placed with tip slightly into the right mainstem bronchus. Cardiomediastinal silhouette is stable in size and configuration.  Suspect trace left pleural effusion. Lungs otherwise clear. No pneumothorax seen. The left shoulder replacement hardware is stable in position. No acute osseous abnormality seen.  A hiatal hernia is better appreciated on the earlier chest x-ray. Again noted is gaseous distention of the stomach.  IMPRESSION: 1. Endotracheal tube tip projects slightly into the right mainstem bronchus. Recommend retracting approximately 2 cm for optimal radiographic positioning. 2. Left-sided internal jugular central line in place with tip at the upper margin of the SVC. 3. No pneumothorax. 4. Suspect trace left pleural effusion versus mild left basilar atelectasis. Lungs otherwise clear.  These results will be called to the ordering clinician or representative by the Radiologist Assistant, and communication documented in the PACS or zVision Dashboard.    Electronically Signed   By: Franki Cabot M.D.   On: 07/28/2014 15:25   Dg Chest Port 1 View  07/28/2014   CLINICAL DATA:  Tachycardia.  Postop left shoulder replacement.  EXAM: PORTABLE CHEST - 1 VIEW  COMPARISON:  07/19/2014  FINDINGS: The patient has undergone left shoulder replacement. The heart size is accentuated by technique. Probable left-sided pleural effusion noted. Large hiatal hernia noted. Right lung is clear. No pulmonary edema. Surgical clips overlie the left axillary region. There is gaseous distension of the stomach.  IMPRESSION: 1. Suspect left pleural effusion. 2. Hiatal hernia.  Gaseous distension of the stomach.   Electronically Signed   By: Nolon Nations M.D.   On: 07/28/2014 09:02   Dg Shoulder Left Port  07/28/2014   CLINICAL DATA:  Pain  EXAM: LEFT SHOULDER - 1 VIEW  COMPARISON:  July 27, 2014  FINDINGS: Frontal view obtained. There is a total shoulder replacement prosthesis on the left with alignment anatomic on this single view. No fracture or dislocation apparent. There is evidence of old trauma involving the lateral left clavicle with widening between the clavicle and acromion, stable. No coracoclavicular separation seen on this study.  IMPRESSION: No change in total shoulder prosthesis alignment on frontal view. No acute fracture or dislocation. Stable widening between the lateral left clavicle and acromion with evidence of old trauma involving the lateral left clavicle.   Electronically Signed   By: Lowella Grip III M.D.   On: 07/28/2014 15:19   Dg Shoulder Left Port  07/27/2014   CLINICAL DATA:  Postop  EXAM: LEFT SHOULDER -  1 VIEW  COMPARISON:  07/14/2014  FINDINGS: Left total shoulder arthroplasty is anatomically aligned. There is no breakage or loosening of the hardware. Gas in the soft tissues is consistent with recent surgery. Loose body above the acetabular component is stable.  IMPRESSION: Left total shoulder arthroplasty anatomically aligned.   Electronically  Signed   By: Marybelle Killings M.D.   On: 07/27/2014 10:04   Dg Abd Portable 1v  07/29/2014   CLINICAL DATA:  81 year old female status post enteric tube placement. Check position.  EXAM: PORTABLE ABDOMEN - 1 VIEW  COMPARISON:  Earlier Radiograph dated 07/28/2014  FINDINGS: There has been interval placement of an enteric tube with tip in the right hemi abdomen likely in the distal stomach. There is no evidence of bowel obstruction. No free air. Moderate stool noted throughout the colon. Excreted contrast noted within the renal collecting systems bilaterally. Cholecystectomy clips. L4-L5 disc spacer and posterior fixation hardware. Degenerative changes of the spine.  IMPRESSION: Enteric tube with tip in the right abdomen likely in the distal stomach. No evidence of bowel obstruction.   Electronically Signed   By: Anner Crete M.D.   On: 07/29/2014 01:11   Dg Abd Portable 1v  07/28/2014   CLINICAL DATA:  Nasogastric tube placement  EXAM: PORTABLE ABDOMEN - 1 VIEW  COMPARISON:  Portable exam 1652 hours compared to 08/31/2012 lumbar spine radiographs  FINDINGS: Tip of endotracheal tube projects 2.7 cm above carina.  Tip of nasogastric tube is in the mid thorax ; recommend advancing tube 18 cm to place proximal side-port within the proximal stomach.  LEFT jugular central venous catheter tip projecting over SVC.  Normal heart size with calcification and tortuosity of thoracic aorta.  Lungs appear emphysematous without gross infiltrate or pleural effusion.  Minimal atelectasis LEFT base.  Prominent bowel gas and stool in colon in the upper abdomen.  Gaseous distention of stomach.  Prior lumbosacral fusion.  Bones demineralized.  IMPRESSION: Tip of nasogastric tube is in the mid thoracic esophagus; recommend advancing tube 18 cm to place proximal side-port within the proximal stomach.  Findings called to [facility] on 07/28/2014 at 1706 hours.   Electronically Signed   By: Lavonia Dana M.D.   On: 07/28/2014  17:07   Ct Extrem Up Entire Arm L Wo/cm  07/28/2014   CLINICAL DATA:  81 year old female with left shoulder reverse arthroplasty. Evaluate for hematoma  EXAM: CT OF THE UPPER LEFT ARM WITHOUT CONTRAST  TECHNIQUE: Multidetector CT imaging was performed according to the standard protocol. Multiplanar CT image reconstructions were also generated.  COMPARISON:  Radiograph dated 07/28/2014 and CT dated 07/14/2014  FINDINGS: Evaluation very limited due to streak artifact caused by metallic arthroplasty.  There is a total left shoulder reverse arthroplasty. There is no evidence of dislocation. There is extensive osteoarthritic changes of the proximal humerus. No acute fracture. Multiple small bone fragments superior to the arthroplasty are similar to prior study and may represent ectopic bone formation or loose bodies. There is diffuse soft tissue edema with small fluid surrounding the joint. No evidence of large hematoma. There is postsurgical changes of the soft tissues in the anterior upper extremity with small pockets of soft tissue air. There is diffuse subcutaneous soft tissue stranding.  A small left pleural effusion may be present.  IMPRESSION: Postsurgical changes of left shoulder reverse arthroplasty. No significant fluid collection or hematoma identified.   Electronically Signed   By: Anner Crete M.D.   On: 07/28/2014 22:46  ASSESSMENT/PLAN:  Hyperkalemia - k 5.5; give Kayexalate 15 gm/60 ml PO X 1; check BMP in AM  Bilateral shoulder pain - increase Norco 5/325 mg 1 tab PO Q 6 hours PRN    MEDINA-VARGAS,Jabrea Kallstrom, NP [employer] [phone]

## 2014-08-19 DIAGNOSIS — I5030 Unspecified diastolic (congestive) heart failure: Secondary | ICD-10-CM | POA: Diagnosis not present

## 2014-08-19 DIAGNOSIS — Z96612 Presence of left artificial shoulder joint: Secondary | ICD-10-CM | POA: Diagnosis not present

## 2014-08-19 DIAGNOSIS — Z471 Aftercare following joint replacement surgery: Secondary | ICD-10-CM | POA: Diagnosis not present

## 2014-08-19 DIAGNOSIS — J449 Chronic obstructive pulmonary disease, unspecified: Secondary | ICD-10-CM | POA: Diagnosis not present

## 2014-08-19 DIAGNOSIS — M1990 Unspecified osteoarthritis, unspecified site: Secondary | ICD-10-CM | POA: Diagnosis not present

## 2014-08-19 DIAGNOSIS — N183 Chronic kidney disease, stage 3 (moderate): Secondary | ICD-10-CM | POA: Diagnosis not present

## 2014-08-20 NOTE — Progress Notes (Signed)
Cardiology Office Note   Date:  08/21/2014   ID:  Andrea Gilmore, DOB 1925/05/12, MRN [id number]  PCP:  Mayra Neer, MD  Cardiologist:  Dr. Liam Rogers   Electrophysiologist:  n/a   Chief Complaint  Patient presents with  . Hospitalization Follow-up    Cardiomyopathy     History of Present Illness: Andrea Gilmore is a 81 y.o. female with a hx of breast CA.   Admitted 7/21-7/30 for L shoulder surgery for RTC arthopathy.  Post op course was c/b respiratory distress requiring intubation, delirium, lactic acidosis, fever AKI, sepsis and ABL anemia.  Echo demonstrated normal LVF.  She required transfusion with PRBCs due to anemia.  She improved from her initial episode of respiratory distress.  She was managed by CCM.  However, she had recurrent respiratory distress/acute pulmonary edema on POD #4 with elevated Troponin levels. She required BiPap. ECG demonstrated new PRWP and Lateral TWI.  She was seen by Cardiology/Dr. Liam Rogers.  Repeat Echo demonstrated EF 20-25% with anteroseptal and inferior AK.  Initial plan was to proceed with cardiac cath. She was evaluated by Dr. Daneen Schick in the cath lab.  She was CP free but still SOB and experiencing orthopnea.  Cardiac cath was deferred.  It was felt that LHC could be considered again depending on her clinical course.  Etiology of her DCM was felt to be ischemic vs stress induced cardiomyopathy.  Troponin peaked at 2.38.  Creatinine did increase some and it was felt that cardiac cath should be avoided at that time.  She was DC to SNF.  She is here with her cousin who is a retired Therapist, sports and her sister.  She has been DC from SNF [facility]) and is now at home. She is working with PT at home. She is walking with AFOs due to hx of foot drop from neuropathy.  She is doing well without chest pain or significant dyspnea.  She denies orthopnea, PND, edema. She denies syncope.  She denies cough, wheezing, fever.     Studies/Reports Reviewed  Today:  Echo 08/01/14 EF 20-25%, anteroseptal and inferior AK, Gr 2 DD, mod MR, mod LAE, mod TR, PASP 63 mmHg  Echo 07/29/14 EF 65%, no RWMA, diastolic dysfunction, normal RV function, mild TR, PASP 34 mmHg.  Chest CTA 07/28/14 IMPRESSION: 1. No evidence of pulmonary nodules. 2. Endotracheal tube seen ending 1-2 cm above the carina. This could be retracted 1-2 cm. 3. Trace bilateral pleural fluid, with minimal associated atelectasis. 4. Moderate hiatal hernia, filled with fluid. 5. Postoperative change about the left shoulder, with scattered soft tissue air and diffuse soft tissue inflammation tracking along the proximal left arm. The visualized portions of the patient's left shoulder arthroplasty appear grossly intact, without evidence of loosening. 6. Scattered calcification along the aortic arch and distal thoracic aorta, and scattered vascular calcifications at the renal hila Bilaterally.   Past Medical History  Diagnosis Date  . Arthritis   . Thyroid disease   . Cancer     breast cancer    Past Surgical History  Procedure Laterality Date  . Abdominal hysterectomy    . Tonsillectomy    . Shoulder surgery Bilateral   . Back surgery    . Bilateral carpal tunnel release    . Cholecystectomy    . Eye surgery Right   . Reverse shoulder arthroplasty Left 07/27/2014    Procedure: REVERSE SHOULDER ARTHROPLASTY;  Surgeon: Tania Ade, MD;  Location: [facility];  Service: Orthopedics;  Laterality: Left;  Left reverse total shoulder arthroplasty     Current Outpatient Prescriptions  Medication Sig Dispense Refill  . bimatoprost (LUMIGAN) 0.01 % SOLN Place 1 drop into both eyes every other day.     . Calcium Carbonate-Vitamin D (CALCIUM + D PO) Take 1 tablet by mouth daily.    . fish oil-omega-3 fatty acids 1000 MG capsule Take 1 g by mouth daily.    . Glucos-Chondroit-Hyaluron-MSM (GLUCOSAMINE CHONDROITIN JOINT) TABS Take 1 tablet by mouth daily.    Marland Kitchen HYDROcodone-acetaminophen  (NORCO/VICODIN) 5-325 MG per tablet Take 1 tablet by mouth every 6 (six) hours as needed for moderate pain.     Marland Kitchen levothyroxine (SYNTHROID, LEVOTHROID) 50 MCG tablet Take 1 tablet by mouth daily.  1  . Menthol-Methyl Salicylate (PAIN RELIEVING RUB EX) Apply 1 application topically as needed (pain).    . Multiple Vitamins-Minerals (MULTIVITAMIN WITH MINERALS) tablet Take 1 tablet by mouth daily.      Marland Kitchen aspirin EC 81 MG tablet Take 1 tablet (81 mg total) by mouth daily.    . metoprolol succinate (TOPROL-XL) 25 MG 24 hr tablet Take 0.5 tablets (12.5 mg total) by mouth daily. 30 tablet 11   No current facility-administered medications for this visit.    Allergies:   Morphine and related; Codeine; Blue dyes (parenteral); and Red dye    Social History:  The patient  reports that she has never smoked. She has never used smokeless tobacco. She reports that she does not drink alcohol or use illicit drugs.   Family History:  The patient's family history includes Brain cancer in her mother; Cancer - Lung in her father.    ROS:   Please see the history of present illness.   Review of Systems  Cardiovascular: Positive for chest pain.  All other systems reviewed and are negative.     PHYSICAL EXAM: VS:  BP 138/44 mmHg  Pulse 64  Ht 4\' 11"  (1.499 m)  Wt 97 lb 9.6 oz (44.271 kg)  BMI 19.70 kg/m2    Wt Readings from Last 3 Encounters:  08/21/14 97 lb 9.6 oz (44.271 kg)  08/16/14 105 lb 6.4 oz (47.809 kg)  08/14/14 105 lb 6.4 oz (47.809 kg)     GEN:  Thins frail elderly female sitting in a wheelchair, in no acute distress HEENT: normal Neck: no JVD at 90 degrees,  no masses Cardiac:  Normal S1/S2, RRR; no murmur, no rubs or gallops, no edema   Respiratory:  clear to auscultation bilaterally, no wheezing, rhonchi or rales. GI: soft, nontender, nondistended, + BS MS: no deformity or atrophy Skin: warm and dry  Neuro:  CNs II-XII intact, Strength and sensation are intact Psych: Normal  affect   EKG:  EKG is ordered today.  It demonstrates:   NSR, HR 64, LAD, T-wave inversions in 1, 2, aVL, aVF, V1-V6   Recent Labs: 07/28/2014: ALT 14 08/01/2014: B Natriuretic Peptide >4500.0*; TSH 3.119 08/05/2014: BUN 25*; Creatinine, Ser 1.19*; Hemoglobin 10.2*; Magnesium 1.9; Platelets 448*; Potassium 3.9; Sodium 138    Lipid Panel    Component Value Date/Time   CHOL 218* 10/21/2010 2049   TRIG 133 10/21/2010 2049   HDL 54 10/21/2010 2049   CHOLHDL 4.0 10/21/2010 2049   VLDL 27 10/21/2010 2049   LDLCALC 137* 10/21/2010 2049      ASSESSMENT AND PLAN:  Cardiomyopathy:  This was in the setting of significant medical illness after shoulder surgery.  LHC was deferred in the hospital due to  CHF and deteriorating creatinine.  It was suspected this was a stress induced CM vs ischemic.  She is not having any chest pain or dyspnea.  ECG is still abnormal and c/w inferior and anterolateral ischemia.  We will need to eventually repeat her Echo.  For now, I will advance her medical Rx. I will start her on Toprol-XL 12.5 mg QD.  Repeat BMET today.  If Creatinine normalized, consider adding  ACE inhibitor at FU.  If EF remains depressed, will need to consider stress testing vs cardiac cath.  Continue ASA 81 mg QD.    Chronic systolic CHF (congestive heart failure):  She is euvolemic on no diuretic Rx.  Check BMET today.   NSTEMI (non-ST elevated myocardial infarction):  She had a significant elevation in Troponin.  As noted, this may have been stress induced vs ischemic.  Continue ASA.  Add beta blocker.  Will plan repeat Echo at some point in the next few weeks as outlined above.    CKD (chronic kidney disease), stage III:  Check BMET today.      Medication Changes: Current medicines are reviewed at length with the patient today.  Concerns regarding medicines are as outlined above.  The following changes have been made:   Discontinued Medications   ASPIRIN 325 MG BUFFERED TABLET    Take  325 mg by mouth daily.    DICLOFENAC SODIUM (VOLTAREN) 1 % GEL    Apply 1 application topically 4 (four) times daily as needed.    IPRATROPIUM-ALBUTEROL (DUONEB) 0.5-2.5 (3) MG/3ML SOLN    Take 3 mLs by nebulization every 8 (eight) hours as needed.   LORAZEPAM (ATIVAN) 0.5 MG TABLET    Take 0.5 mg by mouth every 6 (six) hours as needed for anxiety.   PANTOPRAZOLE (PROTONIX) 20 MG TABLET    Take 20 mg by mouth daily.   Modified Medications   No medications on file   New Prescriptions   ASPIRIN EC 81 MG TABLET    Take 1 tablet (81 mg total) by mouth daily.   METOPROLOL SUCCINATE (TOPROL-XL) 25 MG 24 HR TABLET    Take 0.5 tablets (12.5 mg total) by mouth daily.    Labs/ tests ordered today include:   Orders Placed This Encounter  Procedures  . Basic Metabolic Panel (BMET)  . EKG 12-Lead     Disposition:   FU with Dr. Liam Rogers or me in 2 weeks.     Signed, Versie Starks, MHS 08/21/2014 4:30 PM    [facility] [place], [place], [place]  [address] Phone: [phone]; Fax: [phone]

## 2014-08-21 ENCOUNTER — Encounter: Payer: Self-pay | Admitting: Physician Assistant

## 2014-08-21 ENCOUNTER — Ambulatory Visit: Payer: Medicare Other | Admitting: Physician Assistant

## 2014-08-21 ENCOUNTER — Ambulatory Visit (INDEPENDENT_AMBULATORY_CARE_PROVIDER_SITE_OTHER): Payer: Medicare Other | Admitting: Physician Assistant

## 2014-08-21 VITALS — BP 138/44 | HR 64 | Ht 59.0 in | Wt 97.6 lb

## 2014-08-21 DIAGNOSIS — I5022 Chronic systolic (congestive) heart failure: Secondary | ICD-10-CM

## 2014-08-21 DIAGNOSIS — N183 Chronic kidney disease, stage 3 unspecified: Secondary | ICD-10-CM

## 2014-08-21 DIAGNOSIS — I429 Cardiomyopathy, unspecified: Secondary | ICD-10-CM

## 2014-08-21 DIAGNOSIS — I214 Non-ST elevation (NSTEMI) myocardial infarction: Secondary | ICD-10-CM | POA: Diagnosis not present

## 2014-08-21 LAB — BASIC METABOLIC PANEL
BUN: 23 mg/dL (ref 6–23)
CHLORIDE: 106 meq/L (ref 96–112)
CO2: 26 mEq/L (ref 19–32)
Calcium: 9.8 mg/dL (ref 8.4–10.5)
Creatinine, Ser: 0.96 mg/dL (ref 0.40–1.20)
GFR: 58.39 mL/min — ABNORMAL LOW (ref >60.00)
Glucose, Bld: 81 mg/dL (ref 70–99)
Potassium: 4.4 mEq/L (ref 3.5–5.1)
SODIUM: 141 meq/L (ref 135–145)

## 2014-08-21 MED ORDER — ASPIRIN EC 81 MG PO TBEC: 81.0000 mg | DELAYED_RELEASE_TABLET | Freq: Every day | ORAL | Status: DC

## 2014-08-21 MED ORDER — METOPROLOL SUCCINATE ER 25 MG PO TB24: 12.5000 mg | ORAL_TABLET | Freq: Every day | ORAL | Status: DC

## 2014-08-21 NOTE — Patient Instructions (Signed)
Medication Instructions:  1. START TOPROL XL 25 MG TABLET WITH THE DIRECTIONS TO TAKE 1/2 TABLET DAILY TO = 12.5 MG DAILY  2. CHANGE ASPIRIN TO 81 MG DAILY  Labwork: TODAY BMET  Testing/Procedures: NONE  Follow-Up: 09/07/14 @ 2:40 SCOTT WEAVER, PAC   Any Other Special Instructions Will Be Listed Below (If Applicable).

## 2014-08-22 ENCOUNTER — Telehealth: Payer: Self-pay | Admitting: Physician Assistant

## 2014-08-22 DIAGNOSIS — M1990 Unspecified osteoarthritis, unspecified site: Secondary | ICD-10-CM | POA: Diagnosis not present

## 2014-08-22 DIAGNOSIS — J449 Chronic obstructive pulmonary disease, unspecified: Secondary | ICD-10-CM | POA: Diagnosis not present

## 2014-08-22 DIAGNOSIS — Z471 Aftercare following joint replacement surgery: Secondary | ICD-10-CM | POA: Diagnosis not present

## 2014-08-22 DIAGNOSIS — N183 Chronic kidney disease, stage 3 (moderate): Secondary | ICD-10-CM | POA: Diagnosis not present

## 2014-08-22 DIAGNOSIS — Z96612 Presence of left artificial shoulder joint: Secondary | ICD-10-CM | POA: Diagnosis not present

## 2014-08-22 DIAGNOSIS — I5030 Unspecified diastolic (congestive) heart failure: Secondary | ICD-10-CM | POA: Diagnosis not present

## 2014-08-22 NOTE — Telephone Encounter (Signed)
Agree Stop beta-blocker. She may not be able to tolerate Will see if she can tolerate ACE at FU. Richardson Dopp, PA-C   08/22/2014 5:28 PM

## 2014-08-22 NOTE — Telephone Encounter (Signed)
Pt c/o medication issue:  1. Name of Medication: Metoprolol ersuccinate   2. How are you currently taking this medication (dosage and times per day)? 1Xday 25mg      Pt took a half last night  3. Are you having a reaction (difficulty breathing--STAT)? no  4. What is your medication issue? Pt is jittery and shaky for medications,   she said she is nervous and it makes her feel like she wants to scream

## 2014-08-22 NOTE — Telephone Encounter (Signed)
Patient phoned stating that she took 1/2 of the Metoprolol yesterday as directed Today she has not felt "herself" and is very jittery feeling Discussed with Brynda Rim PA and will have patient stop Metoprolol and keep follow up ov Advised patient, verbalized understanding

## 2014-08-23 DIAGNOSIS — Z96612 Presence of left artificial shoulder joint: Secondary | ICD-10-CM | POA: Diagnosis not present

## 2014-08-23 DIAGNOSIS — I5030 Unspecified diastolic (congestive) heart failure: Secondary | ICD-10-CM | POA: Diagnosis not present

## 2014-08-23 DIAGNOSIS — M1990 Unspecified osteoarthritis, unspecified site: Secondary | ICD-10-CM | POA: Diagnosis not present

## 2014-08-23 DIAGNOSIS — J449 Chronic obstructive pulmonary disease, unspecified: Secondary | ICD-10-CM | POA: Diagnosis not present

## 2014-08-23 DIAGNOSIS — N183 Chronic kidney disease, stage 3 (moderate): Secondary | ICD-10-CM | POA: Diagnosis not present

## 2014-08-23 DIAGNOSIS — Z471 Aftercare following joint replacement surgery: Secondary | ICD-10-CM | POA: Diagnosis not present

## 2014-08-24 DIAGNOSIS — N183 Chronic kidney disease, stage 3 (moderate): Secondary | ICD-10-CM | POA: Diagnosis not present

## 2014-08-24 DIAGNOSIS — J449 Chronic obstructive pulmonary disease, unspecified: Secondary | ICD-10-CM | POA: Diagnosis not present

## 2014-08-24 DIAGNOSIS — Z471 Aftercare following joint replacement surgery: Secondary | ICD-10-CM | POA: Diagnosis not present

## 2014-08-24 DIAGNOSIS — M1990 Unspecified osteoarthritis, unspecified site: Secondary | ICD-10-CM | POA: Diagnosis not present

## 2014-08-24 DIAGNOSIS — I5030 Unspecified diastolic (congestive) heart failure: Secondary | ICD-10-CM | POA: Diagnosis not present

## 2014-08-24 DIAGNOSIS — Z96612 Presence of left artificial shoulder joint: Secondary | ICD-10-CM | POA: Diagnosis not present

## 2014-08-24 NOTE — Telephone Encounter (Signed)
Pt notified of lab results by phone with verbal understanding.  

## 2014-08-28 DIAGNOSIS — N183 Chronic kidney disease, stage 3 (moderate): Secondary | ICD-10-CM | POA: Diagnosis not present

## 2014-08-28 DIAGNOSIS — I5030 Unspecified diastolic (congestive) heart failure: Secondary | ICD-10-CM | POA: Diagnosis not present

## 2014-08-28 DIAGNOSIS — Z96612 Presence of left artificial shoulder joint: Secondary | ICD-10-CM | POA: Diagnosis not present

## 2014-08-28 DIAGNOSIS — Z471 Aftercare following joint replacement surgery: Secondary | ICD-10-CM | POA: Diagnosis not present

## 2014-08-28 DIAGNOSIS — J449 Chronic obstructive pulmonary disease, unspecified: Secondary | ICD-10-CM | POA: Diagnosis not present

## 2014-08-28 DIAGNOSIS — M1990 Unspecified osteoarthritis, unspecified site: Secondary | ICD-10-CM | POA: Diagnosis not present

## 2014-08-29 DIAGNOSIS — Z471 Aftercare following joint replacement surgery: Secondary | ICD-10-CM | POA: Diagnosis not present

## 2014-08-29 DIAGNOSIS — Z96612 Presence of left artificial shoulder joint: Secondary | ICD-10-CM | POA: Diagnosis not present

## 2014-08-29 DIAGNOSIS — N183 Chronic kidney disease, stage 3 (moderate): Secondary | ICD-10-CM | POA: Diagnosis not present

## 2014-08-29 DIAGNOSIS — J449 Chronic obstructive pulmonary disease, unspecified: Secondary | ICD-10-CM | POA: Diagnosis not present

## 2014-08-29 DIAGNOSIS — M1990 Unspecified osteoarthritis, unspecified site: Secondary | ICD-10-CM | POA: Diagnosis not present

## 2014-08-29 DIAGNOSIS — I5030 Unspecified diastolic (congestive) heart failure: Secondary | ICD-10-CM | POA: Diagnosis not present

## 2014-08-30 DIAGNOSIS — Z471 Aftercare following joint replacement surgery: Secondary | ICD-10-CM | POA: Diagnosis not present

## 2014-08-30 DIAGNOSIS — J449 Chronic obstructive pulmonary disease, unspecified: Secondary | ICD-10-CM | POA: Diagnosis not present

## 2014-08-30 DIAGNOSIS — M1990 Unspecified osteoarthritis, unspecified site: Secondary | ICD-10-CM | POA: Diagnosis not present

## 2014-08-30 DIAGNOSIS — I5030 Unspecified diastolic (congestive) heart failure: Secondary | ICD-10-CM | POA: Diagnosis not present

## 2014-08-30 DIAGNOSIS — Z96612 Presence of left artificial shoulder joint: Secondary | ICD-10-CM | POA: Diagnosis not present

## 2014-08-30 DIAGNOSIS — N183 Chronic kidney disease, stage 3 (moderate): Secondary | ICD-10-CM | POA: Diagnosis not present

## 2014-08-31 DIAGNOSIS — M1990 Unspecified osteoarthritis, unspecified site: Secondary | ICD-10-CM | POA: Diagnosis not present

## 2014-08-31 DIAGNOSIS — Z96612 Presence of left artificial shoulder joint: Secondary | ICD-10-CM | POA: Diagnosis not present

## 2014-08-31 DIAGNOSIS — J449 Chronic obstructive pulmonary disease, unspecified: Secondary | ICD-10-CM | POA: Diagnosis not present

## 2014-08-31 DIAGNOSIS — N183 Chronic kidney disease, stage 3 (moderate): Secondary | ICD-10-CM | POA: Diagnosis not present

## 2014-08-31 DIAGNOSIS — I5030 Unspecified diastolic (congestive) heart failure: Secondary | ICD-10-CM | POA: Diagnosis not present

## 2014-08-31 DIAGNOSIS — Z471 Aftercare following joint replacement surgery: Secondary | ICD-10-CM | POA: Diagnosis not present

## 2014-09-05 DIAGNOSIS — J449 Chronic obstructive pulmonary disease, unspecified: Secondary | ICD-10-CM | POA: Diagnosis not present

## 2014-09-05 DIAGNOSIS — M1990 Unspecified osteoarthritis, unspecified site: Secondary | ICD-10-CM | POA: Diagnosis not present

## 2014-09-05 DIAGNOSIS — N183 Chronic kidney disease, stage 3 (moderate): Secondary | ICD-10-CM | POA: Diagnosis not present

## 2014-09-05 DIAGNOSIS — I5030 Unspecified diastolic (congestive) heart failure: Secondary | ICD-10-CM | POA: Diagnosis not present

## 2014-09-05 DIAGNOSIS — Z96612 Presence of left artificial shoulder joint: Secondary | ICD-10-CM | POA: Diagnosis not present

## 2014-09-05 DIAGNOSIS — Z471 Aftercare following joint replacement surgery: Secondary | ICD-10-CM | POA: Diagnosis not present

## 2014-09-06 DIAGNOSIS — M1990 Unspecified osteoarthritis, unspecified site: Secondary | ICD-10-CM | POA: Diagnosis not present

## 2014-09-06 DIAGNOSIS — J449 Chronic obstructive pulmonary disease, unspecified: Secondary | ICD-10-CM | POA: Diagnosis not present

## 2014-09-06 DIAGNOSIS — Z96612 Presence of left artificial shoulder joint: Secondary | ICD-10-CM | POA: Diagnosis not present

## 2014-09-06 DIAGNOSIS — N183 Chronic kidney disease, stage 3 (moderate): Secondary | ICD-10-CM | POA: Diagnosis not present

## 2014-09-06 DIAGNOSIS — Z471 Aftercare following joint replacement surgery: Secondary | ICD-10-CM | POA: Diagnosis not present

## 2014-09-06 DIAGNOSIS — I5030 Unspecified diastolic (congestive) heart failure: Secondary | ICD-10-CM | POA: Diagnosis not present

## 2014-09-06 NOTE — Progress Notes (Signed)
Cardiology Office Note   Date:  09/07/2014   ID:  ADDALEE KAVANAGH, DOB 1925-10-03, MRN [id number]  PCP:  Mayra Neer, MD  Cardiologist:  Dr. Liam Rogers   Electrophysiologist:  n/a   Chief Complaint  Patient presents with  . Cardiomyopathy     History of Present Illness: Andrea Gilmore is a 81 y.o. female with a hx of breast CA.   Admitted 07/2014 for L shoulder surgery for RTC arthopathy.  Post op course was c/b respiratory distress requiring intubation, delirium, lactic acidosis, fever AKI, sepsis and ABL anemia.  Echo demonstrated normal LVF.  She required transfusion with PRBCs due to anemia.  She improved from her initial episode of respiratory distress but had recurrent respiratory distress/acute pulmonary edema on POD #4 with elevated Troponin levels.  ECG demonstrated new changes.  Repeat Echo demonstrated EF 20-25% with anteroseptal and inferior AK.  Initial plan was to proceed with cardiac cath. She was evaluated by Dr. Daneen Schick in the cath lab.  She was CP free but still SOB and experiencing orthopnea and Cardiac cath was deferred.  It was felt that LHC could be considered again depending on her clinical course.  Etiology of her DCM was felt to be ischemic vs stress induced cardiomyopathy.  Troponin peaked at 2.38.  Creatinine did increase some and it was felt that cardiac cath should be avoided at that time.  She was DC to SNF.  Last seen 08/21/14 in FU.  I tried to put her on low dose beta-blocker with Toprol-XL 12.5 mg QD.  She called in with SEs after the first dose and it was DC'd.  She returns for FU.  Here with her daughter.  Doing well.  Now walking with min assistance.  She denies chest pain or significant dyspnea. No orthopnea, PND, edema.  No syncope.      Studies/Reports Reviewed Today:  Echo 08/01/14 EF 20-25%, anteroseptal and inferior AK, Gr 2 DD, mod MR, mod LAE, mod TR, PASP 63 mmHg  Echo 07/29/14 EF 65%, no RWMA, diastolic dysfunction, normal RV  function, mild TR, PASP 34 mmHg.  Chest CTA 07/28/14 IMPRESSION: 1. No evidence of pulmonary nodules. 2. Endotracheal tube seen ending 1-2 cm above the carina. This could be retracted 1-2 cm. 3. Trace bilateral pleural fluid, with minimal associated atelectasis. 4. Moderate hiatal hernia, filled with fluid. 5. Postoperative change about the left shoulder, with scattered soft tissue air and diffuse soft tissue inflammation tracking along the proximal left arm. The visualized portions of the patient's left shoulder arthroplasty appear grossly intact, without evidence of loosening. 6. Scattered calcification along the aortic arch and distal thoracic aorta, and scattered vascular calcifications at the renal hila Bilaterally.   Past Medical History  Diagnosis Date  . Arthritis   . Hypothyroidism   . Breast cancer     breast cancer  . Cardiomyopathy     a. possibly ischemic >> NSTEMI after should surgery in 07/2014 >>Echo 07/29/14:  EF 65%;  EF 08/01/14:  EF 20-25%, ant-septal AK, Gr 2 DD, mod MR, mod LAE, mod TR, PASP 63 mmHg  . Chronic systolic CHF (congestive heart failure)   . CKD (chronic kidney disease)     Past Surgical History  Procedure Laterality Date  . Abdominal hysterectomy    . Tonsillectomy    . Shoulder surgery Bilateral   . Back surgery    . Bilateral carpal tunnel release    . Cholecystectomy    . Eye surgery Right   .  Reverse shoulder arthroplasty Left 07/27/2014    Procedure: REVERSE SHOULDER ARTHROPLASTY;  Surgeon: Tania Ade, MD;  Location: [facility];  Service: Orthopedics;  Laterality: Left;  Left reverse total shoulder arthroplasty     Current Outpatient Prescriptions  Medication Sig Dispense Refill  . aspirin EC 81 MG tablet Take 1 tablet (81 mg total) by mouth daily.    . bimatoprost (LUMIGAN) 0.01 % SOLN Place 1 drop into both eyes every other day.     . Calcium Carbonate-Vitamin D (CALCIUM + D PO) Take 1 tablet by mouth daily.    . fish oil-omega-3 fatty  acids 1000 MG capsule Take 1 g by mouth daily.    . Glucos-Chondroit-Hyaluron-MSM (GLUCOSAMINE CHONDROITIN JOINT) TABS Take 1 tablet by mouth daily.    Marland Kitchen HYDROcodone-acetaminophen (NORCO/VICODIN) 5-325 MG per tablet Take 1 tablet by mouth every 6 (six) hours as needed for moderate pain.     Marland Kitchen levothyroxine (SYNTHROID, LEVOTHROID) 50 MCG tablet Take 1 tablet by mouth daily.  1  . Menthol-Methyl Salicylate (PAIN RELIEVING RUB EX) Apply 1 application topically as needed (pain).    . Multiple Vitamins-Minerals (MULTIVITAMIN WITH MINERALS) tablet Take 1 tablet by mouth daily.      . metoprolol succinate (TOPROL-XL) 25 MG 24 hr tablet Take 0.5 tablets (12.5 mg total) by mouth daily. (Patient not taking: Reported on 09/07/2014) 30 tablet 11   No current facility-administered medications for this visit.    Allergies:   Morphine and related; Codeine; Blue dyes (parenteral); and Red dye    Social History:  The patient  reports that she has never smoked. She has never used smokeless tobacco. She reports that she does not drink alcohol or use illicit drugs.   Family History:  The patient's family history includes Brain cancer in her mother; Cancer - Lung in her father.    ROS:   Please see the history of present illness.   Review of Systems  All other systems reviewed and are negative.     PHYSICAL EXAM: VS:  BP 140/58 mmHg  Pulse 66  Ht 4\' 11"  (1.499 m)  Wt 96 lb 12.8 oz (43.908 kg)  BMI 19.54 kg/m2    Wt Readings from Last 3 Encounters:  09/07/14 96 lb 12.8 oz (43.908 kg)  08/21/14 97 lb 9.6 oz (44.271 kg)  08/16/14 105 lb 6.4 oz (47.809 kg)     GEN:  Thin frail elderly female, in no acute distress HEENT: normal Neck: no JVD,  no masses Cardiac:  Normal S1/S2, RRR; no murmur, no rubs or gallops, no edema   Respiratory:  clear to auscultation bilaterally, no wheezing, rhonchi or rales. GI: soft, nontender, nondistended, + BS MS: no deformity or atrophy Skin: warm and dry  Neuro:   CNs II-XII intact, Strength and sensation are intact Psych: Normal affect   EKG:  EKG is ordered today.  It demonstrates:   NSR, HR 66, Q V1-2, NSSTTW changes, QTc 375 (inf and ant-lat TWI have resolved when compared to prior ECG)    Recent Labs: 07/28/2014: ALT 14 08/01/2014: B Natriuretic Peptide >4500.0*; TSH 3.119 08/05/2014: Hemoglobin 10.2*; Magnesium 1.9; Platelets 448* 08/21/2014: BUN 23; Creatinine, Ser 0.96; Potassium 4.4; Sodium 141    Lipid Panel    Component Value Date/Time   CHOL 218* 10/21/2010 2049   TRIG 133 10/21/2010 2049   HDL 54 10/21/2010 2049   CHOLHDL 4.0 10/21/2010 2049   VLDL 27 10/21/2010 2049   LDLCALC 137* 10/21/2010 2049  ASSESSMENT AND PLAN:  Cardiomyopathy:  This was in the setting of significant medical illness after shoulder surgery.  LHC was deferred in the hospital due to CHF and deteriorating creatinine.  It was suspected this was a stress induced CM vs ischemic.  She is not having any chest pain or dyspnea.  At last visit I tried to start beta blocker Rx but she could not tolerate it.  I discussed the initiation of ACE inhibitor with her today.  However, she has intolerances to medications.  Plus, her ECG is now normal.  Prior Inf and Ant-Lat TWI have resolved.  She is not having any symptoms to suggest angina.  I will repeat a limited Echo to reassess LVF.   If EF remains depressed, will need to consider adding an ACE inhibitor.  Continue ASA 81 mg QD.    Chronic systolic CHF (congestive heart failure):  She is euvolemic on no diuretic Rx.     NSTEMI (non-ST elevated myocardial infarction):  She had a significant elevation in Troponin.  As noted, this may have been stress induced vs ischemic.  Continue ASA.  She is intol of beta blocker.  As noted above, initiation of ACE inhibitor was discussed today.  I will first see what her EF is on repeat limited echo as noted above.  I am not certain she could tolerate a statin.    CKD (chronic kidney  disease), stage III:  Recent creatinine normalized.      Medication Changes: Current medicines are reviewed at length with the patient today.  Concerns regarding medicines are as outlined above.  The following changes have been made:   Discontinued Medications   No medications on file   Modified Medications   No medications on file   New Prescriptions   No medications on file   Labs/ tests ordered today include:   Orders Placed This Encounter  Procedures  . EKG 12-Lead  . Echocardiogram     Disposition:   FU with Dr. Liam Rogers 3 mos.     Signed, Versie Starks, MHS 09/07/2014 3:18 PM    [facility] [place], [place], [place]  [address] Phone: [phone]; Fax: [phone]

## 2014-09-07 ENCOUNTER — Encounter: Payer: Self-pay | Admitting: Physician Assistant

## 2014-09-07 ENCOUNTER — Ambulatory Visit (INDEPENDENT_AMBULATORY_CARE_PROVIDER_SITE_OTHER): Payer: Medicare Other | Admitting: Physician Assistant

## 2014-09-07 VITALS — BP 140/58 | HR 66 | Ht 59.0 in | Wt 96.8 lb

## 2014-09-07 DIAGNOSIS — I5022 Chronic systolic (congestive) heart failure: Secondary | ICD-10-CM | POA: Diagnosis not present

## 2014-09-07 DIAGNOSIS — I429 Cardiomyopathy, unspecified: Secondary | ICD-10-CM

## 2014-09-07 DIAGNOSIS — N183 Chronic kidney disease, stage 3 unspecified: Secondary | ICD-10-CM

## 2014-09-07 DIAGNOSIS — I214 Non-ST elevation (NSTEMI) myocardial infarction: Secondary | ICD-10-CM | POA: Diagnosis not present

## 2014-09-07 NOTE — Patient Instructions (Signed)
Medication Instructions:  Your physician recommends that you continue on your current medications as directed. Please refer to the Current Medication list given to you today.   Labwork: NONE  Testing/Procedures: Your physician has requested that you have an LIMITED echocardiogram. Echocardiography is a painless test that uses sound waves to create images of your heart. It provides your doctor with information about the size and shape of your heart and how well your heart's chambers and valves are working. This procedure takes approximately one hour. There are no restrictions for this procedure.  Follow-Up: 3 MONTHS WITH DR. Acie Fredrickson  Any Other Special Instructions Will Be Listed Below (If Applicable).

## 2014-09-08 DIAGNOSIS — Z471 Aftercare following joint replacement surgery: Secondary | ICD-10-CM | POA: Diagnosis not present

## 2014-09-08 DIAGNOSIS — Z96612 Presence of left artificial shoulder joint: Secondary | ICD-10-CM | POA: Diagnosis not present

## 2014-09-11 DIAGNOSIS — J449 Chronic obstructive pulmonary disease, unspecified: Secondary | ICD-10-CM | POA: Diagnosis not present

## 2014-09-11 DIAGNOSIS — Z96612 Presence of left artificial shoulder joint: Secondary | ICD-10-CM | POA: Diagnosis not present

## 2014-09-11 DIAGNOSIS — N183 Chronic kidney disease, stage 3 (moderate): Secondary | ICD-10-CM | POA: Diagnosis not present

## 2014-09-11 DIAGNOSIS — M1990 Unspecified osteoarthritis, unspecified site: Secondary | ICD-10-CM | POA: Diagnosis not present

## 2014-09-11 DIAGNOSIS — I5030 Unspecified diastolic (congestive) heart failure: Secondary | ICD-10-CM | POA: Diagnosis not present

## 2014-09-11 DIAGNOSIS — Z471 Aftercare following joint replacement surgery: Secondary | ICD-10-CM | POA: Diagnosis not present

## 2014-09-14 DIAGNOSIS — M1990 Unspecified osteoarthritis, unspecified site: Secondary | ICD-10-CM | POA: Diagnosis not present

## 2014-09-14 DIAGNOSIS — Z471 Aftercare following joint replacement surgery: Secondary | ICD-10-CM | POA: Diagnosis not present

## 2014-09-14 DIAGNOSIS — N183 Chronic kidney disease, stage 3 (moderate): Secondary | ICD-10-CM | POA: Diagnosis not present

## 2014-09-14 DIAGNOSIS — I5030 Unspecified diastolic (congestive) heart failure: Secondary | ICD-10-CM | POA: Diagnosis not present

## 2014-09-14 DIAGNOSIS — Z96612 Presence of left artificial shoulder joint: Secondary | ICD-10-CM | POA: Diagnosis not present

## 2014-09-14 DIAGNOSIS — J449 Chronic obstructive pulmonary disease, unspecified: Secondary | ICD-10-CM | POA: Diagnosis not present

## 2014-09-15 ENCOUNTER — Ambulatory Visit (HOSPITAL_COMMUNITY): Payer: Medicare Other | Attending: Internal Medicine

## 2014-09-15 ENCOUNTER — Other Ambulatory Visit (HOSPITAL_COMMUNITY): Payer: Medicare Other

## 2014-09-15 ENCOUNTER — Other Ambulatory Visit: Payer: Self-pay

## 2014-09-15 DIAGNOSIS — I5022 Chronic systolic (congestive) heart failure: Secondary | ICD-10-CM | POA: Diagnosis not present

## 2014-09-15 DIAGNOSIS — I517 Cardiomegaly: Secondary | ICD-10-CM | POA: Diagnosis not present

## 2014-09-15 DIAGNOSIS — I340 Nonrheumatic mitral (valve) insufficiency: Secondary | ICD-10-CM | POA: Insufficient documentation

## 2014-09-15 DIAGNOSIS — Z681 Body mass index (BMI) 19 or less, adult: Secondary | ICD-10-CM | POA: Insufficient documentation

## 2014-09-15 DIAGNOSIS — I429 Cardiomyopathy, unspecified: Secondary | ICD-10-CM

## 2014-09-15 DIAGNOSIS — E43 Unspecified severe protein-calorie malnutrition: Secondary | ICD-10-CM | POA: Insufficient documentation

## 2014-09-18 ENCOUNTER — Encounter: Payer: Self-pay | Admitting: Physician Assistant

## 2014-09-18 DIAGNOSIS — N183 Chronic kidney disease, stage 3 (moderate): Secondary | ICD-10-CM | POA: Diagnosis not present

## 2014-09-18 DIAGNOSIS — Z471 Aftercare following joint replacement surgery: Secondary | ICD-10-CM | POA: Diagnosis not present

## 2014-09-18 DIAGNOSIS — Z96612 Presence of left artificial shoulder joint: Secondary | ICD-10-CM | POA: Diagnosis not present

## 2014-09-18 DIAGNOSIS — I5030 Unspecified diastolic (congestive) heart failure: Secondary | ICD-10-CM | POA: Diagnosis not present

## 2014-09-18 DIAGNOSIS — J449 Chronic obstructive pulmonary disease, unspecified: Secondary | ICD-10-CM | POA: Diagnosis not present

## 2014-09-18 DIAGNOSIS — M1990 Unspecified osteoarthritis, unspecified site: Secondary | ICD-10-CM | POA: Diagnosis not present

## 2014-09-20 DIAGNOSIS — N183 Chronic kidney disease, stage 3 (moderate): Secondary | ICD-10-CM | POA: Diagnosis not present

## 2014-09-20 DIAGNOSIS — Z471 Aftercare following joint replacement surgery: Secondary | ICD-10-CM | POA: Diagnosis not present

## 2014-09-20 DIAGNOSIS — M1990 Unspecified osteoarthritis, unspecified site: Secondary | ICD-10-CM | POA: Diagnosis not present

## 2014-09-20 DIAGNOSIS — Z96612 Presence of left artificial shoulder joint: Secondary | ICD-10-CM | POA: Diagnosis not present

## 2014-09-20 DIAGNOSIS — I5030 Unspecified diastolic (congestive) heart failure: Secondary | ICD-10-CM | POA: Diagnosis not present

## 2014-09-20 DIAGNOSIS — J449 Chronic obstructive pulmonary disease, unspecified: Secondary | ICD-10-CM | POA: Diagnosis not present

## 2014-09-25 DIAGNOSIS — N183 Chronic kidney disease, stage 3 (moderate): Secondary | ICD-10-CM | POA: Diagnosis not present

## 2014-09-25 DIAGNOSIS — J449 Chronic obstructive pulmonary disease, unspecified: Secondary | ICD-10-CM | POA: Diagnosis not present

## 2014-09-25 DIAGNOSIS — M1990 Unspecified osteoarthritis, unspecified site: Secondary | ICD-10-CM | POA: Diagnosis not present

## 2014-09-25 DIAGNOSIS — I5030 Unspecified diastolic (congestive) heart failure: Secondary | ICD-10-CM | POA: Diagnosis not present

## 2014-09-25 DIAGNOSIS — Z96612 Presence of left artificial shoulder joint: Secondary | ICD-10-CM | POA: Diagnosis not present

## 2014-09-25 DIAGNOSIS — Z471 Aftercare following joint replacement surgery: Secondary | ICD-10-CM | POA: Diagnosis not present

## 2014-09-27 DIAGNOSIS — D649 Anemia, unspecified: Secondary | ICD-10-CM | POA: Diagnosis not present

## 2014-09-27 DIAGNOSIS — Z96619 Presence of unspecified artificial shoulder joint: Secondary | ICD-10-CM | POA: Diagnosis not present

## 2014-09-27 DIAGNOSIS — A419 Sepsis, unspecified organism: Secondary | ICD-10-CM | POA: Diagnosis not present

## 2014-09-27 DIAGNOSIS — I214 Non-ST elevation (NSTEMI) myocardial infarction: Secondary | ICD-10-CM | POA: Diagnosis not present

## 2014-09-27 DIAGNOSIS — Z23 Encounter for immunization: Secondary | ICD-10-CM | POA: Diagnosis not present

## 2014-09-27 DIAGNOSIS — N179 Acute kidney failure, unspecified: Secondary | ICD-10-CM | POA: Diagnosis not present

## 2014-09-27 DIAGNOSIS — J9691 Respiratory failure, unspecified with hypoxia: Secondary | ICD-10-CM | POA: Diagnosis not present

## 2014-09-28 DIAGNOSIS — J449 Chronic obstructive pulmonary disease, unspecified: Secondary | ICD-10-CM | POA: Diagnosis not present

## 2014-09-28 DIAGNOSIS — Z96612 Presence of left artificial shoulder joint: Secondary | ICD-10-CM | POA: Diagnosis not present

## 2014-09-28 DIAGNOSIS — I5030 Unspecified diastolic (congestive) heart failure: Secondary | ICD-10-CM | POA: Diagnosis not present

## 2014-09-28 DIAGNOSIS — Z471 Aftercare following joint replacement surgery: Secondary | ICD-10-CM | POA: Diagnosis not present

## 2014-09-28 DIAGNOSIS — N183 Chronic kidney disease, stage 3 (moderate): Secondary | ICD-10-CM | POA: Diagnosis not present

## 2014-09-28 DIAGNOSIS — M1990 Unspecified osteoarthritis, unspecified site: Secondary | ICD-10-CM | POA: Diagnosis not present

## 2014-10-05 DIAGNOSIS — M25612 Stiffness of left shoulder, not elsewhere classified: Secondary | ICD-10-CM | POA: Diagnosis not present

## 2014-10-05 DIAGNOSIS — Z96612 Presence of left artificial shoulder joint: Secondary | ICD-10-CM | POA: Diagnosis not present

## 2014-10-10 DIAGNOSIS — Z96612 Presence of left artificial shoulder joint: Secondary | ICD-10-CM | POA: Diagnosis not present

## 2014-10-10 DIAGNOSIS — M25612 Stiffness of left shoulder, not elsewhere classified: Secondary | ICD-10-CM | POA: Diagnosis not present

## 2014-10-12 DIAGNOSIS — Z96612 Presence of left artificial shoulder joint: Secondary | ICD-10-CM | POA: Diagnosis not present

## 2014-10-12 DIAGNOSIS — M25612 Stiffness of left shoulder, not elsewhere classified: Secondary | ICD-10-CM | POA: Diagnosis not present

## 2014-10-16 DIAGNOSIS — Z961 Presence of intraocular lens: Secondary | ICD-10-CM | POA: Diagnosis not present

## 2014-10-16 DIAGNOSIS — H401133 Primary open-angle glaucoma, bilateral, severe stage: Secondary | ICD-10-CM | POA: Diagnosis not present

## 2014-10-16 DIAGNOSIS — H26491 Other secondary cataract, right eye: Secondary | ICD-10-CM | POA: Diagnosis not present

## 2014-10-16 DIAGNOSIS — H52203 Unspecified astigmatism, bilateral: Secondary | ICD-10-CM | POA: Diagnosis not present

## 2014-10-17 ENCOUNTER — Other Ambulatory Visit: Payer: Medicare Other

## 2014-10-18 DIAGNOSIS — Z96612 Presence of left artificial shoulder joint: Secondary | ICD-10-CM | POA: Diagnosis not present

## 2014-10-18 DIAGNOSIS — M25612 Stiffness of left shoulder, not elsewhere classified: Secondary | ICD-10-CM | POA: Diagnosis not present

## 2014-10-20 DIAGNOSIS — M75121 Complete rotator cuff tear or rupture of right shoulder, not specified as traumatic: Secondary | ICD-10-CM | POA: Diagnosis not present

## 2014-10-20 DIAGNOSIS — R262 Difficulty in walking, not elsewhere classified: Secondary | ICD-10-CM | POA: Diagnosis not present

## 2014-10-20 DIAGNOSIS — M6281 Muscle weakness (generalized): Secondary | ICD-10-CM | POA: Diagnosis not present

## 2014-10-23 DIAGNOSIS — R262 Difficulty in walking, not elsewhere classified: Secondary | ICD-10-CM | POA: Diagnosis not present

## 2014-10-23 DIAGNOSIS — M6281 Muscle weakness (generalized): Secondary | ICD-10-CM | POA: Diagnosis not present

## 2014-10-24 ENCOUNTER — Ambulatory Visit: Payer: Medicare Other | Admitting: Physician Assistant

## 2014-10-24 DIAGNOSIS — M810 Age-related osteoporosis without current pathological fracture: Secondary | ICD-10-CM | POA: Diagnosis not present

## 2014-10-25 DIAGNOSIS — M6281 Muscle weakness (generalized): Secondary | ICD-10-CM | POA: Diagnosis not present

## 2014-10-25 DIAGNOSIS — R262 Difficulty in walking, not elsewhere classified: Secondary | ICD-10-CM | POA: Diagnosis not present

## 2014-10-31 DIAGNOSIS — M6281 Muscle weakness (generalized): Secondary | ICD-10-CM | POA: Diagnosis not present

## 2014-10-31 DIAGNOSIS — R262 Difficulty in walking, not elsewhere classified: Secondary | ICD-10-CM | POA: Diagnosis not present

## 2014-11-01 DIAGNOSIS — M6281 Muscle weakness (generalized): Secondary | ICD-10-CM | POA: Diagnosis not present

## 2014-11-01 DIAGNOSIS — R262 Difficulty in walking, not elsewhere classified: Secondary | ICD-10-CM | POA: Diagnosis not present

## 2014-11-02 DIAGNOSIS — H26491 Other secondary cataract, right eye: Secondary | ICD-10-CM | POA: Diagnosis not present

## 2014-11-02 DIAGNOSIS — H2640 Unspecified secondary cataract: Secondary | ICD-10-CM | POA: Diagnosis not present

## 2014-11-08 DIAGNOSIS — R0789 Other chest pain: Secondary | ICD-10-CM | POA: Diagnosis not present

## 2014-11-17 DIAGNOSIS — R21 Rash and other nonspecific skin eruption: Secondary | ICD-10-CM | POA: Diagnosis not present

## 2014-11-22 DIAGNOSIS — R0789 Other chest pain: Secondary | ICD-10-CM | POA: Diagnosis not present

## 2014-11-23 DIAGNOSIS — R262 Difficulty in walking, not elsewhere classified: Secondary | ICD-10-CM | POA: Diagnosis not present

## 2014-11-23 DIAGNOSIS — M6281 Muscle weakness (generalized): Secondary | ICD-10-CM | POA: Diagnosis not present

## 2014-11-24 DIAGNOSIS — R262 Difficulty in walking, not elsewhere classified: Secondary | ICD-10-CM | POA: Diagnosis not present

## 2014-11-24 DIAGNOSIS — M6281 Muscle weakness (generalized): Secondary | ICD-10-CM | POA: Diagnosis not present

## 2014-11-27 DIAGNOSIS — M6281 Muscle weakness (generalized): Secondary | ICD-10-CM | POA: Diagnosis not present

## 2014-11-27 DIAGNOSIS — R262 Difficulty in walking, not elsewhere classified: Secondary | ICD-10-CM | POA: Diagnosis not present

## 2014-11-28 DIAGNOSIS — Z96612 Presence of left artificial shoulder joint: Secondary | ICD-10-CM | POA: Diagnosis not present

## 2014-11-28 DIAGNOSIS — M25612 Stiffness of left shoulder, not elsewhere classified: Secondary | ICD-10-CM | POA: Diagnosis not present

## 2014-11-29 DIAGNOSIS — Z96612 Presence of left artificial shoulder joint: Secondary | ICD-10-CM | POA: Diagnosis not present

## 2014-11-29 DIAGNOSIS — M25612 Stiffness of left shoulder, not elsewhere classified: Secondary | ICD-10-CM | POA: Diagnosis not present

## 2014-12-04 DIAGNOSIS — Z96612 Presence of left artificial shoulder joint: Secondary | ICD-10-CM | POA: Diagnosis not present

## 2014-12-04 DIAGNOSIS — M25612 Stiffness of left shoulder, not elsewhere classified: Secondary | ICD-10-CM | POA: Diagnosis not present

## 2014-12-07 DIAGNOSIS — R262 Difficulty in walking, not elsewhere classified: Secondary | ICD-10-CM | POA: Diagnosis not present

## 2014-12-08 ENCOUNTER — Ambulatory Visit (INDEPENDENT_AMBULATORY_CARE_PROVIDER_SITE_OTHER): Payer: Medicare Other | Admitting: Cardiovascular Disease

## 2014-12-08 ENCOUNTER — Encounter: Payer: Self-pay | Admitting: Cardiovascular Disease

## 2014-12-08 VITALS — BP 130/74 | HR 64 | Ht 59.0 in | Wt 100.0 lb

## 2014-12-08 DIAGNOSIS — I5181 Takotsubo syndrome: Secondary | ICD-10-CM | POA: Diagnosis not present

## 2014-12-08 NOTE — Progress Notes (Signed)
Cardiology Office Note   Date:  12/08/2014   ID:  Andrea Gilmore, DOB 1925/05/12, MRN [id number]  PCP:  Mayra Neer, MD  Cardiologist:  Dr. Liam Rogers   Electrophysiologist:  n/a   Chief Complaint  Patient presents with  . Follow-up     History of Present Illness: Andrea Gilmore is a 81 y.o. female with a hx of breast CA.   Admitted 07/2014 for L shoulder surgery for RTC arthopathy.  Post op course was c/b respiratory distress requiring intubation, delirium, lactic acidosis, fever AKI, sepsis and ABL anemia.  Echo demonstrated normal LVF.  She required transfusion with PRBCs due to anemia.  She improved from her initial episode of respiratory distress but had recurrent respiratory distress/acute pulmonary edema on POD #4 with elevated Troponin levels.  ECG demonstrated new changes.  Repeat Echo demonstrated EF 20-25% with anteroseptal and inferior AK.  Initial plan was to proceed with cardiac cath. She was evaluated by Dr. Daneen Schick in the cath lab.  She was CP free but still SOB and experiencing orthopnea and Cardiac cath was deferred.  It was felt that LHC could be considered again depending on her clinical course.  Etiology of her DCM was felt to be ischemic vs stress induced cardiomyopathy.  Troponin peaked at 2.38.  Creatinine did increase some and it was felt that cardiac cath should be avoided at that time.  She was DC to SNF.  Last seen 08/21/14 in FU.  I tried to put her on low dose beta-blocker with Toprol-XL 12.5 mg QD.  She called in with SEs after the first dose and it was DC'd.  She returns for FU.  Here with her daughter.  Doing well.  Now walking with min assistance.  She denies chest pain or significant dyspnea. No orthopnea, PND, edema.  No syncope.         Echo 08/01/14 EF 20-25%, anteroseptal and inferior AK, Gr 2 DD, mod MR, mod LAE, mod TR, PASP 63 mmHg  Echo 07/29/14 EF 65%, no RWMA, diastolic dysfunction, normal RV function, mild TR, PASP 34 mmHg.   Dec. 2, 2016:   Andrea Gilmore is seen back today . I met her in July, 2016 when she had respiratory distress following orthopedic surgery. She did not have a cardiac catheter station. Echocardiogram was suggestive of a stress-induced cardiomyopathy.  doing well from a cardiac standpoint. No CP or dyspnea.  Breathing is ok.   Worked in the yard twice this past week . Left shoulder is still bothering her .   Echo in July showed EF of 25%.   Follow up echo in Sept. Showed normal LV function  - EF 65-70%.    Past Medical History  Diagnosis Date  . Arthritis   . Hypothyroidism   . Breast cancer ([place])     breast cancer  . Cardiomyopathy ([place])     a. possibly ischemic >> NSTEMI after should surgery in 07/2014 >>Echo 07/29/14:  EF 65%;  EF 08/01/14:  EF 20-25%, ant-septal AK, Gr 2 DD, mod MR, mod LAE, mod TR, PASP 63 mmHg  . Chronic systolic CHF (congestive heart failure) ([place])   . CKD (chronic kidney disease)   . History of echocardiogram     Echo 9/16:  Vigorous LVF, EF 65-70%, Gr 1 DD, mild MR, mild LAE, PASP 32 mmHg    Past Surgical History  Procedure Laterality Date  . Abdominal hysterectomy    . Tonsillectomy    . Shoulder surgery  Bilateral   . Back surgery    . Bilateral carpal tunnel release    . Cholecystectomy    . Eye surgery Right   . Reverse shoulder arthroplasty Left 07/27/2014    Procedure: REVERSE SHOULDER ARTHROPLASTY;  Surgeon: Tania Ade, MD;  Location: [facility];  Service: Orthopedics;  Laterality: Left;  Left reverse total shoulder arthroplasty     Current Outpatient Prescriptions  Medication Sig Dispense Refill  . aspirin EC 81 MG tablet Take 1 tablet (81 mg total) by mouth daily.    . bimatoprost (LUMIGAN) 0.01 % SOLN Place 1 drop into both eyes every other day.     . Calcium Carbonate-Vitamin D (CALCIUM + D PO) Take 1 tablet by mouth daily.    . fish oil-omega-3 fatty acids 1000 MG capsule Take 1 g by mouth daily.    . Glucos-Chondroit-Hyaluron-MSM (GLUCOSAMINE  CHONDROITIN JOINT) TABS Take 1 tablet by mouth daily.    Marland Kitchen HYDROcodone-acetaminophen (NORCO/VICODIN) 5-325 MG per tablet Take 1 tablet by mouth every 6 (six) hours as needed for moderate pain.     Marland Kitchen levothyroxine (SYNTHROID, LEVOTHROID) 50 MCG tablet Take 1 tablet by mouth daily.  1  . Multiple Vitamins-Minerals (MULTIVITAMIN WITH MINERALS) tablet Take 1 tablet by mouth daily.       No current facility-administered medications for this visit.    Allergies:   Metoprolol succinate er; Morphine and related; Codeine; Blue dyes (parenteral); Brimonidine; Brinzolamide; Dorzolamide hcl-timolol mal pf; Red dye; and Morphine    Social History:  The patient  reports that she has never smoked. She has never used smokeless tobacco. She reports that she does not drink alcohol or use illicit drugs.   Family History:  The patient's family history includes Brain cancer in her mother; Cancer - Lung in her father.    ROS:   Please see the history of present illness.   Review of Systems  All other systems reviewed and are negative.     PHYSICAL EXAM: VS:  BP 130/74 mmHg  Pulse 64  Ht '4\' 11"'  (1.499 m)  Wt 100 lb (45.36 kg)  BMI 20.19 kg/m2    Wt Readings from Last 3 Encounters:  12/08/14 100 lb (45.36 kg)  09/07/14 96 lb 12.8 oz (43.908 kg)  08/21/14 97 lb 9.6 oz (44.271 kg)     GEN:  Thin frail elderly female, in no acute distress HEENT: normal Neck: no JVD,  no masses Cardiac:  Normal S1/S2, RRR; no murmur, no rubs or gallops, no edema   Respiratory:  clear to auscultation bilaterally, no wheezing, rhonchi or rales. GI: soft, nontender, nondistended, + BS MS: no deformity or atrophy Skin: warm and dry  Neuro:  CNs II-XII intact, Strength and sensation are intact Psych: Normal affect   Recent Labs: 07/28/2014: ALT 14 08/01/2014: B Natriuretic Peptide >4500.0*; TSH 3.119 08/05/2014: Hemoglobin 10.2*; Magnesium 1.9; Platelets 448* 08/21/2014: BUN 23; Creatinine, Ser 0.96; Potassium 4.4;  Sodium 141    Lipid Panel    Component Value Date/Time   CHOL 218* 10/21/2010 2049   TRIG 133 10/21/2010 2049   HDL 54 10/21/2010 2049   CHOLHDL 4.0 10/21/2010 2049   VLDL 27 10/21/2010 2049   LDLCALC 137* 10/21/2010 2049      ASSESSMENT AND PLAN:  Cardiomyopathy:  Her LV function  has normalized.  She is not on any cardiac meds.  At this point, she  Has healed up quite nicely and did not require any medications. I do not think that she  needs any additional medications. She's 81 years old and is completely asymptomatic.   Chronic systolic CHF (congestive heart failure):  She is euvolemic on no diuretic Rx.     NSTEMI (non-ST elevated myocardial infarction):  She had a significant elevation in Troponin.  As noted, this may have been stress induced     CKD (chronic kidney disease), stage III:  Recent creatinine normalized.     I will see her on an as needed basis .    Nahser, Wonda Cheng, MD  12/08/2014 2:29 PM    [facility],  [place], [place]  [address] Pager [phone] Phone: [phone]; Fax: [phone]   [facility]  [address] [place], [place]  [address] [phone]   Fax [phone]

## 2014-12-08 NOTE — Patient Instructions (Signed)

## 2014-12-12 DIAGNOSIS — R262 Difficulty in walking, not elsewhere classified: Secondary | ICD-10-CM | POA: Diagnosis not present

## 2014-12-13 DIAGNOSIS — E46 Unspecified protein-calorie malnutrition: Secondary | ICD-10-CM | POA: Diagnosis not present

## 2014-12-13 DIAGNOSIS — Z96619 Presence of unspecified artificial shoulder joint: Secondary | ICD-10-CM | POA: Diagnosis not present

## 2014-12-13 DIAGNOSIS — E039 Hypothyroidism, unspecified: Secondary | ICD-10-CM | POA: Diagnosis not present

## 2014-12-13 DIAGNOSIS — R809 Proteinuria, unspecified: Secondary | ICD-10-CM | POA: Diagnosis not present

## 2014-12-13 DIAGNOSIS — M4800 Spinal stenosis, site unspecified: Secondary | ICD-10-CM | POA: Diagnosis not present

## 2014-12-13 DIAGNOSIS — I10 Essential (primary) hypertension: Secondary | ICD-10-CM | POA: Diagnosis not present

## 2014-12-13 DIAGNOSIS — B029 Zoster without complications: Secondary | ICD-10-CM | POA: Diagnosis not present

## 2014-12-14 DIAGNOSIS — R262 Difficulty in walking, not elsewhere classified: Secondary | ICD-10-CM | POA: Diagnosis not present

## 2014-12-18 DIAGNOSIS — R262 Difficulty in walking, not elsewhere classified: Secondary | ICD-10-CM | POA: Diagnosis not present

## 2014-12-20 DIAGNOSIS — R262 Difficulty in walking, not elsewhere classified: Secondary | ICD-10-CM | POA: Diagnosis not present

## 2014-12-25 DIAGNOSIS — R262 Difficulty in walking, not elsewhere classified: Secondary | ICD-10-CM | POA: Diagnosis not present

## 2014-12-27 DIAGNOSIS — R262 Difficulty in walking, not elsewhere classified: Secondary | ICD-10-CM | POA: Diagnosis not present

## 2014-12-29 DIAGNOSIS — R262 Difficulty in walking, not elsewhere classified: Secondary | ICD-10-CM | POA: Diagnosis not present

## 2015-01-02 DIAGNOSIS — R262 Difficulty in walking, not elsewhere classified: Secondary | ICD-10-CM | POA: Diagnosis not present

## 2015-01-03 DIAGNOSIS — R262 Difficulty in walking, not elsewhere classified: Secondary | ICD-10-CM | POA: Diagnosis not present

## 2015-01-04 DIAGNOSIS — R262 Difficulty in walking, not elsewhere classified: Secondary | ICD-10-CM | POA: Diagnosis not present

## 2015-01-09 DIAGNOSIS — R262 Difficulty in walking, not elsewhere classified: Secondary | ICD-10-CM | POA: Diagnosis not present

## 2015-01-11 DIAGNOSIS — R262 Difficulty in walking, not elsewhere classified: Secondary | ICD-10-CM | POA: Diagnosis not present

## 2015-01-12 DIAGNOSIS — R262 Difficulty in walking, not elsewhere classified: Secondary | ICD-10-CM | POA: Diagnosis not present

## 2015-01-19 DIAGNOSIS — R262 Difficulty in walking, not elsewhere classified: Secondary | ICD-10-CM | POA: Diagnosis not present

## 2015-01-22 DIAGNOSIS — R262 Difficulty in walking, not elsewhere classified: Secondary | ICD-10-CM | POA: Diagnosis not present

## 2015-01-24 DIAGNOSIS — R262 Difficulty in walking, not elsewhere classified: Secondary | ICD-10-CM | POA: Diagnosis not present

## 2015-01-25 ENCOUNTER — Other Ambulatory Visit: Payer: Self-pay | Admitting: Family Medicine

## 2015-01-25 ENCOUNTER — Ambulatory Visit
Admission: RE | Admit: 2015-01-25 | Discharge: 2015-01-25 | Disposition: A | Discharge status: Home / Self Care | Payer: Medicare Other | Source: Ambulatory Visit | Attending: Family Medicine | Admitting: Family Medicine

## 2015-01-25 DIAGNOSIS — S32511A Fracture of superior rim of right pubis, initial encounter for closed fracture: Secondary | ICD-10-CM | POA: Diagnosis not present

## 2015-01-25 DIAGNOSIS — M25551 Pain in right hip: Secondary | ICD-10-CM

## 2015-01-25 DIAGNOSIS — S32691A Other specified fracture of right ischium, initial encounter for closed fracture: Secondary | ICD-10-CM | POA: Diagnosis not present

## 2015-01-26 DIAGNOSIS — S329XXA Fracture of unspecified parts of lumbosacral spine and pelvis, initial encounter for closed fracture: Secondary | ICD-10-CM | POA: Diagnosis not present

## 2015-01-30 DIAGNOSIS — M25561 Pain in right knee: Secondary | ICD-10-CM | POA: Diagnosis not present

## 2015-02-05 DIAGNOSIS — R102 Pelvic and perineal pain: Secondary | ICD-10-CM | POA: Diagnosis not present

## 2015-02-07 DIAGNOSIS — S329XXA Fracture of unspecified parts of lumbosacral spine and pelvis, initial encounter for closed fracture: Secondary | ICD-10-CM | POA: Diagnosis not present

## 2015-02-07 DIAGNOSIS — M810 Age-related osteoporosis without current pathological fracture: Secondary | ICD-10-CM | POA: Diagnosis not present

## 2015-02-07 DIAGNOSIS — M4800 Spinal stenosis, site unspecified: Secondary | ICD-10-CM | POA: Diagnosis not present

## 2015-02-07 DIAGNOSIS — M21372 Foot drop, left foot: Secondary | ICD-10-CM | POA: Diagnosis not present

## 2015-02-07 DIAGNOSIS — E039 Hypothyroidism, unspecified: Secondary | ICD-10-CM | POA: Diagnosis not present

## 2015-02-07 DIAGNOSIS — M21371 Foot drop, right foot: Secondary | ICD-10-CM | POA: Diagnosis not present

## 2015-02-07 DIAGNOSIS — H409 Unspecified glaucoma: Secondary | ICD-10-CM | POA: Diagnosis not present

## 2015-02-07 DIAGNOSIS — G629 Polyneuropathy, unspecified: Secondary | ICD-10-CM | POA: Diagnosis not present

## 2015-02-07 DIAGNOSIS — E46 Unspecified protein-calorie malnutrition: Secondary | ICD-10-CM | POA: Diagnosis not present

## 2015-02-07 DIAGNOSIS — I10 Essential (primary) hypertension: Secondary | ICD-10-CM | POA: Diagnosis not present

## 2015-02-07 DIAGNOSIS — Z0000 Encounter for general adult medical examination without abnormal findings: Secondary | ICD-10-CM | POA: Diagnosis not present

## 2015-02-07 DIAGNOSIS — R809 Proteinuria, unspecified: Secondary | ICD-10-CM | POA: Diagnosis not present

## 2015-02-13 DIAGNOSIS — M810 Age-related osteoporosis without current pathological fracture: Secondary | ICD-10-CM | POA: Diagnosis not present

## 2015-02-19 DIAGNOSIS — S329XXD Fracture of unspecified parts of lumbosacral spine and pelvis, subsequent encounter for fracture with routine healing: Secondary | ICD-10-CM | POA: Diagnosis not present

## 2015-03-09 ENCOUNTER — Encounter (HOSPITAL_COMMUNITY): Payer: Self-pay | Admitting: Emergency Medicine

## 2015-03-09 ENCOUNTER — Emergency Department (HOSPITAL_COMMUNITY)
Admission: EM | Admit: 2015-03-09 | Discharge: 2015-03-09 | Disposition: A | Discharge status: Home / Self Care | Payer: Medicare Other | Attending: Emergency Medicine | Admitting: Emergency Medicine

## 2015-03-09 DIAGNOSIS — S0181XA Laceration without foreign body of other part of head, initial encounter: Secondary | ICD-10-CM

## 2015-03-09 DIAGNOSIS — N189 Chronic kidney disease, unspecified: Secondary | ICD-10-CM | POA: Diagnosis not present

## 2015-03-09 DIAGNOSIS — E039 Hypothyroidism, unspecified: Secondary | ICD-10-CM | POA: Insufficient documentation

## 2015-03-09 DIAGNOSIS — S01111A Laceration without foreign body of right eyelid and periocular area, initial encounter: Secondary | ICD-10-CM | POA: Diagnosis not present

## 2015-03-09 DIAGNOSIS — Z79899 Other long term (current) drug therapy: Secondary | ICD-10-CM | POA: Diagnosis not present

## 2015-03-09 DIAGNOSIS — Z7982 Long term (current) use of aspirin: Secondary | ICD-10-CM | POA: Diagnosis not present

## 2015-03-09 DIAGNOSIS — S0011XA Contusion of right eyelid and periocular area, initial encounter: Secondary | ICD-10-CM | POA: Diagnosis not present

## 2015-03-09 DIAGNOSIS — W1839XA Other fall on same level, initial encounter: Secondary | ICD-10-CM | POA: Insufficient documentation

## 2015-03-09 DIAGNOSIS — S098XXA Other specified injuries of head, initial encounter: Secondary | ICD-10-CM | POA: Diagnosis not present

## 2015-03-09 DIAGNOSIS — I5022 Chronic systolic (congestive) heart failure: Secondary | ICD-10-CM | POA: Diagnosis not present

## 2015-03-09 DIAGNOSIS — M1990 Unspecified osteoarthritis, unspecified site: Secondary | ICD-10-CM | POA: Insufficient documentation

## 2015-03-09 DIAGNOSIS — Y9389 Activity, other specified: Secondary | ICD-10-CM | POA: Diagnosis not present

## 2015-03-09 DIAGNOSIS — S80212A Abrasion, left knee, initial encounter: Secondary | ICD-10-CM | POA: Diagnosis not present

## 2015-03-09 DIAGNOSIS — Y9289 Other specified places as the place of occurrence of the external cause: Secondary | ICD-10-CM | POA: Insufficient documentation

## 2015-03-09 DIAGNOSIS — S80211A Abrasion, right knee, initial encounter: Secondary | ICD-10-CM | POA: Insufficient documentation

## 2015-03-09 DIAGNOSIS — S0083XA Contusion of other part of head, initial encounter: Secondary | ICD-10-CM

## 2015-03-09 DIAGNOSIS — S0990XA Unspecified injury of head, initial encounter: Secondary | ICD-10-CM | POA: Diagnosis present

## 2015-03-09 DIAGNOSIS — W19XXXA Unspecified fall, initial encounter: Secondary | ICD-10-CM

## 2015-03-09 DIAGNOSIS — Y990 Civilian activity done for income or pay: Secondary | ICD-10-CM | POA: Insufficient documentation

## 2015-03-09 DIAGNOSIS — Z853 Personal history of malignant neoplasm of breast: Secondary | ICD-10-CM | POA: Insufficient documentation

## 2015-03-09 DIAGNOSIS — S0190XA Unspecified open wound of unspecified part of head, initial encounter: Secondary | ICD-10-CM | POA: Diagnosis not present

## 2015-03-09 MED ORDER — LIDOCAINE HCL (CARDIAC) 20 MG/ML IV SOLN
INTRAVENOUS | Status: AC
  Filled 2015-03-09: qty 5

## 2015-03-09 MED ORDER — LIDOCAINE HCL (PF) 1 % IJ SOLN
INTRAMUSCULAR | Status: AC
  Administered 2015-03-09: 5 mL
  Filled 2015-03-09: qty 5

## 2015-03-09 MED ORDER — LIDOCAINE HCL (PF) 2 % IJ SOLN: 2.0000 mL | Freq: Once | INTRAMUSCULAR | Status: DC

## 2015-03-09 NOTE — ED Provider Notes (Signed)
CSN: [id number]     Arrival date & time 03/09/15  1524 History   First MD Initiated Contact with Patient 03/09/15 1535     Chief Complaint  Patient presents with  . Fall  . 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 She reports she just close the door of her car and her cane dropped on the ground. She bent over to pick it up and lost her balance falling forward. She reports that she hit her head but had no loss of consciousness and has no headache. She reports there was some bleeding and she was told she needed some stitches. She denies any confusion or incoordination. Ever she also skinned her knees but does not have leg pain or upper extremity pain. No blood thinners. Past Medical History  Diagnosis Date  . Arthritis   . Hypothyroidism   . Breast cancer ([place])     breast cancer  . Cardiomyopathy ([place])     a. possibly ischemic >> NSTEMI after should surgery in 07/2014 >>Echo 07/29/14:  EF 65%;  EF 08/01/14:  EF 20-25%, ant-septal AK, Gr 2 DD, mod MR, mod LAE, mod TR, PASP 63 mmHg  . Chronic systolic CHF (congestive heart failure) ([place])   . CKD (chronic kidney disease)   . History of echocardiogram     Echo 9/16:  Vigorous LVF, EF 65-70%, Gr 1 DD, mild MR, mild LAE, PASP 32 mmHg   Past Surgical History  Procedure Laterality Date  . Abdominal hysterectomy    . Tonsillectomy    . Shoulder surgery Bilateral   . Back surgery    . Bilateral carpal tunnel release    . Cholecystectomy    . Eye surgery Right   . Reverse shoulder arthroplasty Left 07/27/2014    Procedure: REVERSE SHOULDER ARTHROPLASTY;  Surgeon: Tania Ade, MD;  Location: [facility];  Service: Orthopedics;  Laterality: Left;  Left reverse total shoulder arthroplasty   Family History  Problem Relation Age of Onset  . Brain cancer Mother   . Cancer - Lung Father     Social History  Substance Use Topics  . Smoking status: Never Smoker   . Smokeless tobacco: Never Used  . Alcohol Use: No   OB History    No data available     Review of Systems 10 Systems reviewed and are negative for acute change except as noted in the HPI.   Allergies  Metoprolol succinate er; Brimonidine; Brinzolamide; Codeine; Dorzolamide hcl-timolol mal pf; Blue dyes (parenteral); Morphine and related; and Red dye  Home Medications   Prior to Admission medications   Medication Sig Start Date End Date Taking? Authorizing Provider  aspirin EC 81 MG tablet Take 1 tablet (81 mg total) by mouth daily. 08/21/14  Yes Liliane Shi, PA-C  Calcium Carbonate-Vitamin D (CALCIUM + D PO) Take 1 tablet by mouth daily.   Yes Historical Provider, MD  fish oil-omega-3 fatty acids 1000 MG capsule Take 1 g by mouth daily.   Yes Historical Provider, MD  Glucos-Chondroit-Hyaluron-MSM (GLUCOSAMINE CHONDROITIN JOINT) TABS Take 1 tablet by mouth daily.   Yes Historical Provider, MD  HYDROcodone-acetaminophen (NORCO/VICODIN) 5-325 MG per tablet Take 1 tablet by mouth every 6 (six) hours as needed for moderate pain.    Yes Historical Provider, MD  latanoprost (XALATAN) 0.005 % ophthalmic solution Place 1 drop into both eyes every other  day. 01/29/15  Yes Historical Provider, MD  levothyroxine (SYNTHROID, LEVOTHROID) 50 MCG tablet Take 1 tablet by mouth daily. 03/13/14  Yes Historical Provider, MD  Multiple Vitamins-Minerals (MULTIVITAMIN WITH MINERALS) tablet Take 1 tablet by mouth daily.     Yes Historical Provider, MD   Pulse 64  Temp(Src) 98.7 F (37.1 C) (Oral)  Resp 16  Ht 4' 9.5" (1.461 m)  Wt 101 lb (45.813 kg)  BMI 21.46 kg/m2  SpO2 99% Physical Exam  Constitutional: She is oriented to person, place, and time. She appears well-developed and well-nourished.  HENT:  Hematoma right lateral brow. 1 cm, linear laceration without active bleeding. Ears are patent with no blood. Oral cavity  pain bleeding or intraoral injury. Contusion right zygomatic arch without tenderness or crepitus.  Eyes: EOM are normal. Pupils are equal, round, and reactive to light.  Neck: Neck supple.  Cardiovascular: Normal rate, regular rhythm, normal heart sounds and intact distal pulses.   Pulmonary/Chest: Effort normal and breath sounds normal.  Abdominal: Soft. Bowel sounds are normal. She exhibits no distension. There is no tenderness.  Musculoskeletal: Normal range of motion. She exhibits no edema or tenderness.  Minor abrasions to right and left knees. No effusions and no hematomas. Normal range of motion.  Neurological: She is alert and oriented to person, place, and time. She has normal strength. Coordination normal. GCS eye subscore is 4. GCS verbal subscore is 5. GCS motor subscore is 6.  Skin: Skin is warm, dry and intact.  Psychiatric: She has a normal mood and affect.    ED Course  .Marland KitchenLaceration Repair Date/Time: 03/09/2015 4:57 PM Performed by: Charlesetta Shanks Authorized by: Charlesetta Shanks Body area: head/neck Location details: right eyebrow Laceration length: 1 cm Foreign bodies: no foreign bodies Tendon involvement: none Nerve involvement: none Vascular damage: no Anesthesia: local infiltration Local anesthetic: lidocaine 1% without epinephrine Irrigation solution: saline Amount of cleaning: standard Debridement: none Degree of undermining: none Skin closure: 5-0 Prolene Number of sutures: 2 Technique: simple Approximation: close Approximation difficulty: simple Dressing: antibiotic ointment Patient tolerance: Patient tolerated the procedure well with no immediate complications   (including critical care time)  Labs Review Labs Reviewed - No data to display  Imaging Review No results found. I have personally reviewed and evaluated these images and lab results as part of my medical decision-making.   EKG Interpretation None      MDM   Final diagnoses:   Fall, initial encounter  Facial contusion, initial encounter  Facial laceration, initial encounter  Knee abrasion, right, initial encounter   Patient is a mechanical fall. She is alert and well in appearance. Her mental status is clear. She denies loss of consciousness. He does not take anticoagulants. Patient denies any headache. He has minor abrasions to her knees without any deformity or effusion. Range of motion intact. At this time, patient stable for discharge. Head injury precautions are provided.    Charlesetta Shanks, MD 03/09/15 1700

## 2015-03-09 NOTE — ED Notes (Addendum)
Pt arrives from work via [employer] reporting mechanical fall in parking lot.  Pt reports reaching down to ground to retrieve dropped cane and losing balance, falling hitting face.  Lac noted to R eyebrow, swelling and bruising noted to R cheek and lip.  Abrasions noted to R elbow and bilat knees. Pt denies dizziness, lightheadedness, LOC.  Pt AOx4, NAD noted.  Pt denies pain.

## 2015-03-09 NOTE — Discharge Instructions (Signed)
Facial Laceration ° A facial laceration is a cut on the face. These injuries can be painful and cause bleeding. Lacerations usually heal quickly, but they need special care to reduce scarring. °DIAGNOSIS  °Your health care provider will take a medical history, ask for details about how the injury occurred, and examine the wound to determine how deep the cut is. °TREATMENT  °Some facial lacerations may not require closure. Others may not be able to be closed because of an increased risk of infection. The risk of infection and the chance for successful closure will depend on various factors, including the amount of time since the injury occurred. °The wound may be cleaned to help prevent infection. If closure is appropriate, pain medicines may be given if needed. Your health care provider will use stitches (sutures), wound glue (adhesive), or skin adhesive strips to repair the laceration. These tools bring the skin edges together to allow for faster healing and a better cosmetic outcome. If needed, you may also be given a tetanus shot. °HOME CARE INSTRUCTIONS °· Only take over-the-counter or prescription medicines as directed by your health care provider. °· Follow your health care provider's instructions for wound care. These instructions will vary depending on the technique used for closing the wound. °For Sutures: °· Keep the wound clean and dry.   °· If you were given a bandage (dressing), you should change it at least once a day. Also change the dressing if it becomes wet or dirty, or as directed by your health care provider.   °· Wash the wound with soap and water 2 times a day. Rinse the wound off with water to remove all soap. Pat the wound dry with a clean towel.   °· After cleaning, apply a thin layer of the antibiotic ointment recommended by your health care provider. This will help prevent infection and keep the dressing from sticking.   °· You may shower as usual after the first 24 hours. Do not soak the  wound in water until the sutures are removed.   °· Get your sutures removed as directed by your health care provider. With facial lacerations, sutures should usually be taken out after 4-5 days to avoid stitch marks.   °· Wait a few days after your sutures are removed before applying any makeup. °For Skin Adhesive Strips: °· Keep the wound clean and dry.   °· Do not get the skin adhesive strips wet. You may bathe carefully, using caution to keep the wound dry.   °· If the wound gets wet, pat it dry with a clean towel.   °· Skin adhesive strips will fall off on their own. You may trim the strips as the wound heals. Do not remove skin adhesive strips that are still stuck to the wound. They will fall off in time.   °For Wound Adhesive: °· You may briefly wet your wound in the shower or bath. Do not soak or scrub the wound. Do not swim. Avoid periods of heavy sweating until the skin adhesive has fallen off on its own. After showering or bathing, gently pat the wound dry with a clean towel.   °· Do not apply liquid medicine, cream medicine, ointment medicine, or makeup to your wound while the skin adhesive is in place. This may loosen the film before your wound is healed.   °· If a dressing is placed over the wound, be careful not to apply tape directly over the skin adhesive. This may cause the adhesive to be pulled off before the wound is healed.   °· Avoid   not to apply tape directly over the skin adhesive. This may cause the adhesive to be pulled off before the wound is healed.    Avoid prolonged exposure to sunlight or tanning lamps while the skin adhesive is in place.   The skin adhesive will usually remain in place for 5-10 days, then naturally fall off the skin. Do not pick at the adhesive film.   After Healing:  Once the wound has healed, cover the wound with sunscreen during the day for 1 full year. This can help minimize scarring. Exposure to ultraviolet light in the first year will darken the scar. It can take 1-2 years for the scar to lose its redness and to heal completely.   SEEK MEDICAL CARE IF:   You have a fever.  SEEK IMMEDIATE  MEDICAL CARE IF:   You have redness, pain, or swelling around the wound.    You see ayellowish-white fluid (pus) coming from the wound.      This information is not intended to replace advice given to you by your health care provider. Make sure you discuss any questions you have with your health care provider.     Document Released: 01/31/2004 Document Revised: 01/13/2014 Document Reviewed: 08/05/2012  Elsevier Interactive Patient Education 2016 Elsevier [employer]  Head Injury, Adult  You have received a head injury. It does not appear serious at this time. Headaches and vomiting are common following head injury. It should be easy to awaken from sleeping. Sometimes it is necessary for you to stay in the emergency department for a while for observation. Sometimes admission to the hospital may be needed. After injuries such as yours, most problems occur within the first 24 hours, but side effects may occur up to 7-10 days after the injury. It is important for you to carefully monitor your condition and contact your health care provider or seek immediate medical care if there is a change in your condition.  WHAT ARE THE TYPES OF HEAD INJURIES?  Head injuries can be as minor as a bump. Some head injuries can be more severe. More severe head injuries include:   A jarring injury to the brain (concussion).   A bruise of the brain (contusion). This mean there is bleeding in the brain that can cause swelling.   A cracked skull (skull fracture).   Bleeding in the brain that collects, clots, and forms a bump (hematoma).  WHAT CAUSES A HEAD INJURY?  A serious head injury is most likely to happen to someone who is in a car wreck and is not wearing a seat belt. Other causes of major head injuries include bicycle or motorcycle accidents, sports injuries, and falls.  HOW ARE HEAD INJURIES DIAGNOSED?  A complete history of the event leading to the injury and your current symptoms will be helpful in diagnosing head injuries.  Many times, pictures of the brain, such as CT or MRI are needed to see the extent of the injury. Often, an overnight hospital stay is necessary for observation.   WHEN SHOULD I SEEK IMMEDIATE MEDICAL CARE?   You should get help right away if:   You have confusion or drowsiness.   You feel sick to your stomach (nauseous) or have continued, forceful vomiting.   You have dizziness or unsteadiness that is getting worse.   You have severe, continued headaches not relieved by medicine. Only take over-the-counter or prescription medicines for pain, fever, or discomfort as directed by your health care provider.   You do   after the injury, you must stay with someone who can watch you for the warning signs. This person should contact local emergency services (911 in the [place] if you have seizures, you become unconscious, or you are unable to wake up. HOW CAN I PREVENT A HEAD INJURY IN THE FUTURE? The most important factor for preventing major head injuries is avoiding motor vehicle accidents. To minimize the potential for damage to your head, it is crucial to wear seat belts while riding in motor vehicles. Wearing helmets while bike riding and playing collision sports (like football) is also helpful. Also, avoiding dangerous activities around the house will further help reduce your risk of head injury.  WHEN CAN I RETURN TO NORMAL ACTIVITIES AND ATHLETICS? You should be reevaluated by your health care provider before returning to these activities. If you have any of the following symptoms, you should not return to activities or contact sports until 1 week after the symptoms have stopped:  Persistent headache.  Dizziness or  vertigo.  Poor attention and concentration.  Confusion.  Memory problems.  Nausea or vomiting.  Fatigue or tire easily.  Irritability.  Intolerant of bright lights or loud noises.  Anxiety or depression.  Disturbed sleep. MAKE SURE YOU:   Understand these instructions.  Will watch your condition.  Will get help right away if you are not doing well or get worse.   This information is not intended to replace advice given to you by your health care provider. Make sure you discuss any questions you have with your health care provider.   Document Released: 12/23/2004 Document Revised: 01/13/2014 Document Reviewed: 08/30/2012 Elsevier Interactive Patient Education 2016 [employer] A contusion is a deep bruise. Contusions are the result of a blunt injury to tissues and muscle fibers under the skin. The injury causes bleeding under the skin. The skin overlying the contusion may turn blue, purple, or yellow. Minor injuries will give you a painless contusion, but more severe contusions may stay painful and swollen for a few weeks.  CAUSES  This condition is usually caused by a blow, trauma, or direct force to an area of the body. SYMPTOMS  Symptoms of this condition include:  Swelling of the injured area.  Pain and tenderness in the injured area.  Discoloration. The area may have redness and then turn blue, purple, or yellow. DIAGNOSIS  This condition is diagnosed based on a physical exam and medical history. An X-ray, CT scan, or MRI may be needed to determine if there are any associated injuries, such as broken bones (fractures). TREATMENT  Specific treatment for this condition depends on what area of the body was injured. In general, the best treatment for a contusion is resting, icing, applying pressure to (compression), and elevating the injured area. This is often called the RICE strategy. Over-the-counter anti-inflammatory medicines may also be recommended  for pain control.  HOME CARE INSTRUCTIONS   Rest the injured area.  If directed, apply ice to the injured area:  Put ice in a plastic bag.  Place a towel between your skin and the bag.  Leave the ice on for 20 minutes, 2-3 times per day.  If directed, apply light compression to the injured area using an elastic bandage. Make sure the bandage is not wrapped too tightly. Remove and reapply the bandage as directed by your health care provider.  If possible, raise (elevate) the injured area above the level of your heart while you are sitting or lying down.  Take over-the-counter and prescription medicines only as told by your health care provider. SEEK MEDICAL CARE IF:  Your symptoms do not improve after several days of treatment.  Your symptoms get worse.  You have difficulty moving the injured area. SEEK IMMEDIATE MEDICAL CARE IF:   You have severe pain.  You have numbness in a hand or foot.  Your hand or foot turns pale or cold.   This information is not intended to replace advice given to you by your health care provider. Make sure you discuss any questions you have with your health care provider.   Document Released: 10/02/2004 Document Revised: 09/13/2014 Document Reviewed: 05/10/2014 Elsevier Interactive Patient Education [employer].

## 2015-03-16 DIAGNOSIS — M4800 Spinal stenosis, site unspecified: Secondary | ICD-10-CM | POA: Diagnosis not present

## 2015-03-16 DIAGNOSIS — F322 Major depressive disorder, single episode, severe without psychotic features: Secondary | ICD-10-CM | POA: Diagnosis not present

## 2015-03-16 DIAGNOSIS — Z4802 Encounter for removal of sutures: Secondary | ICD-10-CM | POA: Diagnosis not present

## 2015-03-16 DIAGNOSIS — S0191XA Laceration without foreign body of unspecified part of head, initial encounter: Secondary | ICD-10-CM | POA: Diagnosis not present

## 2015-03-28 DIAGNOSIS — M1712 Unilateral primary osteoarthritis, left knee: Secondary | ICD-10-CM | POA: Diagnosis not present

## 2015-04-05 DIAGNOSIS — R262 Difficulty in walking, not elsewhere classified: Secondary | ICD-10-CM | POA: Diagnosis not present

## 2015-04-13 DIAGNOSIS — R262 Difficulty in walking, not elsewhere classified: Secondary | ICD-10-CM | POA: Diagnosis not present

## 2015-04-16 DIAGNOSIS — R262 Difficulty in walking, not elsewhere classified: Secondary | ICD-10-CM | POA: Diagnosis not present

## 2015-04-18 DIAGNOSIS — F322 Major depressive disorder, single episode, severe without psychotic features: Secondary | ICD-10-CM | POA: Diagnosis not present

## 2015-04-18 DIAGNOSIS — E46 Unspecified protein-calorie malnutrition: Secondary | ICD-10-CM | POA: Diagnosis not present

## 2015-04-19 DIAGNOSIS — R262 Difficulty in walking, not elsewhere classified: Secondary | ICD-10-CM | POA: Diagnosis not present

## 2015-04-23 DIAGNOSIS — R262 Difficulty in walking, not elsewhere classified: Secondary | ICD-10-CM | POA: Diagnosis not present

## 2015-04-25 DIAGNOSIS — M1711 Unilateral primary osteoarthritis, right knee: Secondary | ICD-10-CM | POA: Diagnosis not present

## 2015-04-26 DIAGNOSIS — R262 Difficulty in walking, not elsewhere classified: Secondary | ICD-10-CM | POA: Diagnosis not present

## 2015-04-27 DIAGNOSIS — M19011 Primary osteoarthritis, right shoulder: Secondary | ICD-10-CM | POA: Diagnosis not present

## 2015-04-30 DIAGNOSIS — R262 Difficulty in walking, not elsewhere classified: Secondary | ICD-10-CM | POA: Diagnosis not present

## 2015-05-02 DIAGNOSIS — M1711 Unilateral primary osteoarthritis, right knee: Secondary | ICD-10-CM | POA: Diagnosis not present

## 2015-05-03 DIAGNOSIS — R262 Difficulty in walking, not elsewhere classified: Secondary | ICD-10-CM | POA: Diagnosis not present

## 2015-05-07 DIAGNOSIS — R262 Difficulty in walking, not elsewhere classified: Secondary | ICD-10-CM | POA: Diagnosis not present

## 2015-05-09 DIAGNOSIS — M1711 Unilateral primary osteoarthritis, right knee: Secondary | ICD-10-CM | POA: Diagnosis not present

## 2015-05-10 DIAGNOSIS — R262 Difficulty in walking, not elsewhere classified: Secondary | ICD-10-CM | POA: Diagnosis not present

## 2015-05-16 DIAGNOSIS — M1712 Unilateral primary osteoarthritis, left knee: Secondary | ICD-10-CM | POA: Diagnosis not present

## 2015-05-16 DIAGNOSIS — R262 Difficulty in walking, not elsewhere classified: Secondary | ICD-10-CM | POA: Diagnosis not present

## 2015-05-16 DIAGNOSIS — M1711 Unilateral primary osteoarthritis, right knee: Secondary | ICD-10-CM | POA: Diagnosis not present

## 2015-05-22 DIAGNOSIS — R262 Difficulty in walking, not elsewhere classified: Secondary | ICD-10-CM | POA: Diagnosis not present

## 2015-05-23 DIAGNOSIS — M1712 Unilateral primary osteoarthritis, left knee: Secondary | ICD-10-CM | POA: Diagnosis not present

## 2015-05-23 DIAGNOSIS — M1711 Unilateral primary osteoarthritis, right knee: Secondary | ICD-10-CM | POA: Diagnosis not present

## 2015-05-24 DIAGNOSIS — R262 Difficulty in walking, not elsewhere classified: Secondary | ICD-10-CM | POA: Diagnosis not present

## 2015-05-29 DIAGNOSIS — R262 Difficulty in walking, not elsewhere classified: Secondary | ICD-10-CM | POA: Diagnosis not present

## 2015-05-30 DIAGNOSIS — M1712 Unilateral primary osteoarthritis, left knee: Secondary | ICD-10-CM | POA: Diagnosis not present

## 2015-05-31 DIAGNOSIS — R262 Difficulty in walking, not elsewhere classified: Secondary | ICD-10-CM | POA: Diagnosis not present

## 2015-06-05 DIAGNOSIS — R262 Difficulty in walking, not elsewhere classified: Secondary | ICD-10-CM | POA: Diagnosis not present

## 2015-06-06 DIAGNOSIS — M1712 Unilateral primary osteoarthritis, left knee: Secondary | ICD-10-CM | POA: Diagnosis not present

## 2015-06-07 DIAGNOSIS — E039 Hypothyroidism, unspecified: Secondary | ICD-10-CM | POA: Diagnosis not present

## 2015-06-07 DIAGNOSIS — M810 Age-related osteoporosis without current pathological fracture: Secondary | ICD-10-CM | POA: Diagnosis not present

## 2015-06-07 DIAGNOSIS — R262 Difficulty in walking, not elsewhere classified: Secondary | ICD-10-CM | POA: Diagnosis not present

## 2015-06-07 DIAGNOSIS — M4800 Spinal stenosis, site unspecified: Secondary | ICD-10-CM | POA: Diagnosis not present

## 2015-06-07 DIAGNOSIS — M21371 Foot drop, right foot: Secondary | ICD-10-CM | POA: Diagnosis not present

## 2015-06-07 DIAGNOSIS — M21372 Foot drop, left foot: Secondary | ICD-10-CM | POA: Diagnosis not present

## 2015-06-07 DIAGNOSIS — E46 Unspecified protein-calorie malnutrition: Secondary | ICD-10-CM | POA: Diagnosis not present

## 2015-06-07 DIAGNOSIS — F322 Major depressive disorder, single episode, severe without psychotic features: Secondary | ICD-10-CM | POA: Diagnosis not present

## 2015-06-07 DIAGNOSIS — I10 Essential (primary) hypertension: Secondary | ICD-10-CM | POA: Diagnosis not present

## 2015-06-07 DIAGNOSIS — G629 Polyneuropathy, unspecified: Secondary | ICD-10-CM | POA: Diagnosis not present

## 2015-06-07 DIAGNOSIS — M1990 Unspecified osteoarthritis, unspecified site: Secondary | ICD-10-CM | POA: Diagnosis not present

## 2015-06-12 DIAGNOSIS — M4806 Spinal stenosis, lumbar region: Secondary | ICD-10-CM | POA: Diagnosis not present

## 2015-06-12 DIAGNOSIS — M4316 Spondylolisthesis, lumbar region: Secondary | ICD-10-CM | POA: Diagnosis not present

## 2015-06-12 DIAGNOSIS — M5136 Other intervertebral disc degeneration, lumbar region: Secondary | ICD-10-CM | POA: Diagnosis not present

## 2015-06-12 DIAGNOSIS — M549 Dorsalgia, unspecified: Secondary | ICD-10-CM | POA: Diagnosis not present

## 2015-06-12 DIAGNOSIS — M47816 Spondylosis without myelopathy or radiculopathy, lumbar region: Secondary | ICD-10-CM | POA: Diagnosis not present

## 2015-06-12 DIAGNOSIS — M546 Pain in thoracic spine: Secondary | ICD-10-CM | POA: Diagnosis not present

## 2015-06-13 DIAGNOSIS — M1712 Unilateral primary osteoarthritis, left knee: Secondary | ICD-10-CM | POA: Diagnosis not present

## 2015-06-15 DIAGNOSIS — R262 Difficulty in walking, not elsewhere classified: Secondary | ICD-10-CM | POA: Diagnosis not present

## 2015-06-18 DIAGNOSIS — M5136 Other intervertebral disc degeneration, lumbar region: Secondary | ICD-10-CM | POA: Diagnosis not present

## 2015-06-18 DIAGNOSIS — N183 Chronic kidney disease, stage 3 (moderate): Secondary | ICD-10-CM | POA: Diagnosis not present

## 2015-06-18 DIAGNOSIS — N179 Acute kidney failure, unspecified: Secondary | ICD-10-CM | POA: Diagnosis not present

## 2015-06-19 DIAGNOSIS — Z853 Personal history of malignant neoplasm of breast: Secondary | ICD-10-CM | POA: Diagnosis not present

## 2015-06-19 DIAGNOSIS — Z1231 Encounter for screening mammogram for malignant neoplasm of breast: Secondary | ICD-10-CM | POA: Diagnosis not present

## 2015-06-20 DIAGNOSIS — M5126 Other intervertebral disc displacement, lumbar region: Secondary | ICD-10-CM | POA: Diagnosis not present

## 2015-06-20 DIAGNOSIS — M4806 Spinal stenosis, lumbar region: Secondary | ICD-10-CM | POA: Diagnosis not present

## 2015-06-27 DIAGNOSIS — M4806 Spinal stenosis, lumbar region: Secondary | ICD-10-CM | POA: Diagnosis not present

## 2015-06-27 DIAGNOSIS — M5136 Other intervertebral disc degeneration, lumbar region: Secondary | ICD-10-CM | POA: Diagnosis not present

## 2015-06-27 DIAGNOSIS — M47816 Spondylosis without myelopathy or radiculopathy, lumbar region: Secondary | ICD-10-CM | POA: Diagnosis not present

## 2015-06-27 DIAGNOSIS — M4316 Spondylolisthesis, lumbar region: Secondary | ICD-10-CM | POA: Diagnosis not present

## 2015-07-11 DIAGNOSIS — M19011 Primary osteoarthritis, right shoulder: Secondary | ICD-10-CM | POA: Diagnosis not present

## 2015-07-18 DIAGNOSIS — M25561 Pain in right knee: Secondary | ICD-10-CM | POA: Diagnosis not present

## 2015-07-18 DIAGNOSIS — M25562 Pain in left knee: Secondary | ICD-10-CM | POA: Diagnosis not present

## 2015-07-24 DIAGNOSIS — H401123 Primary open-angle glaucoma, left eye, severe stage: Secondary | ICD-10-CM | POA: Diagnosis not present

## 2015-07-24 DIAGNOSIS — Z961 Presence of intraocular lens: Secondary | ICD-10-CM | POA: Diagnosis not present

## 2015-07-24 DIAGNOSIS — H401113 Primary open-angle glaucoma, right eye, severe stage: Secondary | ICD-10-CM | POA: Diagnosis not present

## 2015-08-07 DIAGNOSIS — M4806 Spinal stenosis, lumbar region: Secondary | ICD-10-CM | POA: Diagnosis not present

## 2015-08-07 DIAGNOSIS — M47816 Spondylosis without myelopathy or radiculopathy, lumbar region: Secondary | ICD-10-CM | POA: Diagnosis not present

## 2015-08-07 DIAGNOSIS — M4316 Spondylolisthesis, lumbar region: Secondary | ICD-10-CM | POA: Diagnosis not present

## 2015-08-07 DIAGNOSIS — R030 Elevated blood-pressure reading, without diagnosis of hypertension: Secondary | ICD-10-CM | POA: Diagnosis not present

## 2015-08-07 DIAGNOSIS — M5136 Other intervertebral disc degeneration, lumbar region: Secondary | ICD-10-CM | POA: Diagnosis not present

## 2015-08-14 DIAGNOSIS — M810 Age-related osteoporosis without current pathological fracture: Secondary | ICD-10-CM | POA: Diagnosis not present

## 2015-08-23 DIAGNOSIS — M4806 Spinal stenosis, lumbar region: Secondary | ICD-10-CM | POA: Diagnosis not present

## 2015-08-23 DIAGNOSIS — M4726 Other spondylosis with radiculopathy, lumbar region: Secondary | ICD-10-CM | POA: Diagnosis not present

## 2015-08-23 DIAGNOSIS — M5116 Intervertebral disc disorders with radiculopathy, lumbar region: Secondary | ICD-10-CM | POA: Diagnosis not present

## 2015-09-06 DIAGNOSIS — Z23 Encounter for immunization: Secondary | ICD-10-CM | POA: Diagnosis not present

## 2015-09-06 DIAGNOSIS — M4806 Spinal stenosis, lumbar region: Secondary | ICD-10-CM | POA: Diagnosis not present

## 2015-09-06 DIAGNOSIS — M47816 Spondylosis without myelopathy or radiculopathy, lumbar region: Secondary | ICD-10-CM | POA: Diagnosis not present

## 2015-09-06 DIAGNOSIS — R32 Unspecified urinary incontinence: Secondary | ICD-10-CM | POA: Diagnosis not present

## 2015-09-06 DIAGNOSIS — M5136 Other intervertebral disc degeneration, lumbar region: Secondary | ICD-10-CM | POA: Diagnosis not present

## 2015-09-06 DIAGNOSIS — M4316 Spondylolisthesis, lumbar region: Secondary | ICD-10-CM | POA: Diagnosis not present

## 2015-09-13 DIAGNOSIS — R351 Nocturia: Secondary | ICD-10-CM | POA: Diagnosis not present

## 2015-09-13 DIAGNOSIS — N3942 Incontinence without sensory awareness: Secondary | ICD-10-CM | POA: Diagnosis not present

## 2015-09-13 DIAGNOSIS — N3946 Mixed incontinence: Secondary | ICD-10-CM | POA: Diagnosis not present

## 2015-09-19 DIAGNOSIS — M1711 Unilateral primary osteoarthritis, right knee: Secondary | ICD-10-CM | POA: Diagnosis not present

## 2015-09-19 DIAGNOSIS — N3000 Acute cystitis without hematuria: Secondary | ICD-10-CM | POA: Diagnosis not present

## 2015-09-19 DIAGNOSIS — N3942 Incontinence without sensory awareness: Secondary | ICD-10-CM | POA: Diagnosis not present

## 2015-09-19 DIAGNOSIS — M19011 Primary osteoarthritis, right shoulder: Secondary | ICD-10-CM | POA: Diagnosis not present

## 2015-10-12 DIAGNOSIS — I10 Essential (primary) hypertension: Secondary | ICD-10-CM | POA: Diagnosis not present

## 2015-10-12 DIAGNOSIS — M810 Age-related osteoporosis without current pathological fracture: Secondary | ICD-10-CM | POA: Diagnosis not present

## 2015-10-12 DIAGNOSIS — D692 Other nonthrombocytopenic purpura: Secondary | ICD-10-CM | POA: Diagnosis not present

## 2015-10-12 DIAGNOSIS — E039 Hypothyroidism, unspecified: Secondary | ICD-10-CM | POA: Diagnosis not present

## 2015-10-12 DIAGNOSIS — K219 Gastro-esophageal reflux disease without esophagitis: Secondary | ICD-10-CM | POA: Diagnosis not present

## 2015-10-12 DIAGNOSIS — E46 Unspecified protein-calorie malnutrition: Secondary | ICD-10-CM | POA: Diagnosis not present

## 2015-10-12 DIAGNOSIS — G629 Polyneuropathy, unspecified: Secondary | ICD-10-CM | POA: Diagnosis not present

## 2015-10-12 DIAGNOSIS — M1990 Unspecified osteoarthritis, unspecified site: Secondary | ICD-10-CM | POA: Diagnosis not present

## 2015-10-12 DIAGNOSIS — F322 Major depressive disorder, single episode, severe without psychotic features: Secondary | ICD-10-CM | POA: Diagnosis not present

## 2015-10-12 DIAGNOSIS — M4800 Spinal stenosis, site unspecified: Secondary | ICD-10-CM | POA: Diagnosis not present

## 2015-10-18 DIAGNOSIS — R945 Abnormal results of liver function studies: Secondary | ICD-10-CM | POA: Diagnosis not present

## 2015-10-31 DIAGNOSIS — M1711 Unilateral primary osteoarthritis, right knee: Secondary | ICD-10-CM | POA: Diagnosis not present

## 2015-10-31 DIAGNOSIS — M1712 Unilateral primary osteoarthritis, left knee: Secondary | ICD-10-CM | POA: Diagnosis not present

## 2015-11-06 DIAGNOSIS — M5136 Other intervertebral disc degeneration, lumbar region: Secondary | ICD-10-CM | POA: Diagnosis not present

## 2015-11-06 DIAGNOSIS — M47816 Spondylosis without myelopathy or radiculopathy, lumbar region: Secondary | ICD-10-CM | POA: Diagnosis not present

## 2015-11-06 DIAGNOSIS — M48062 Spinal stenosis, lumbar region with neurogenic claudication: Secondary | ICD-10-CM | POA: Diagnosis not present

## 2015-11-06 DIAGNOSIS — M4316 Spondylolisthesis, lumbar region: Secondary | ICD-10-CM | POA: Diagnosis not present

## 2015-11-07 DIAGNOSIS — M1711 Unilateral primary osteoarthritis, right knee: Secondary | ICD-10-CM | POA: Diagnosis not present

## 2015-11-07 DIAGNOSIS — M1712 Unilateral primary osteoarthritis, left knee: Secondary | ICD-10-CM | POA: Diagnosis not present

## 2015-11-14 DIAGNOSIS — M1711 Unilateral primary osteoarthritis, right knee: Secondary | ICD-10-CM | POA: Diagnosis not present

## 2015-11-14 DIAGNOSIS — M1712 Unilateral primary osteoarthritis, left knee: Secondary | ICD-10-CM | POA: Diagnosis not present

## 2015-11-20 DIAGNOSIS — E039 Hypothyroidism, unspecified: Secondary | ICD-10-CM | POA: Diagnosis not present

## 2015-11-21 DIAGNOSIS — M170 Bilateral primary osteoarthritis of knee: Secondary | ICD-10-CM | POA: Diagnosis not present

## 2015-11-28 DIAGNOSIS — M1711 Unilateral primary osteoarthritis, right knee: Secondary | ICD-10-CM | POA: Diagnosis not present

## 2015-11-28 DIAGNOSIS — M1712 Unilateral primary osteoarthritis, left knee: Secondary | ICD-10-CM | POA: Diagnosis not present

## 2015-12-01 DIAGNOSIS — Z886 Allergy status to analgesic agent status: Secondary | ICD-10-CM | POA: Diagnosis not present

## 2015-12-01 DIAGNOSIS — Z91048 Other nonmedicinal substance allergy status: Secondary | ICD-10-CM | POA: Diagnosis not present

## 2015-12-01 DIAGNOSIS — S0990XA Unspecified injury of head, initial encounter: Secondary | ICD-10-CM | POA: Diagnosis not present

## 2015-12-01 DIAGNOSIS — S0181XA Laceration without foreign body of other part of head, initial encounter: Secondary | ICD-10-CM | POA: Diagnosis not present

## 2015-12-01 DIAGNOSIS — Y92002 Bathroom of unspecified non-institutional (private) residence single-family (private) house as the place of occurrence of the external cause: Secondary | ICD-10-CM | POA: Diagnosis not present

## 2015-12-01 DIAGNOSIS — W1839XA Other fall on same level, initial encounter: Secondary | ICD-10-CM | POA: Diagnosis not present

## 2015-12-01 DIAGNOSIS — Z9181 History of falling: Secondary | ICD-10-CM | POA: Diagnosis not present

## 2015-12-01 DIAGNOSIS — W010XXA Fall on same level from slipping, tripping and stumbling without subsequent striking against object, initial encounter: Secondary | ICD-10-CM | POA: Diagnosis not present

## 2015-12-01 DIAGNOSIS — S01111A Laceration without foreign body of right eyelid and periocular area, initial encounter: Secondary | ICD-10-CM | POA: Diagnosis not present

## 2015-12-07 DIAGNOSIS — S0181XA Laceration without foreign body of other part of head, initial encounter: Secondary | ICD-10-CM | POA: Diagnosis not present

## 2015-12-26 DIAGNOSIS — M19011 Primary osteoarthritis, right shoulder: Secondary | ICD-10-CM | POA: Diagnosis not present

## 2015-12-26 DIAGNOSIS — M1711 Unilateral primary osteoarthritis, right knee: Secondary | ICD-10-CM | POA: Diagnosis not present

## 2015-12-26 DIAGNOSIS — M1712 Unilateral primary osteoarthritis, left knee: Secondary | ICD-10-CM | POA: Diagnosis not present

## 2016-02-15 DIAGNOSIS — H401133 Primary open-angle glaucoma, bilateral, severe stage: Secondary | ICD-10-CM | POA: Diagnosis not present

## 2016-02-15 DIAGNOSIS — Z961 Presence of intraocular lens: Secondary | ICD-10-CM | POA: Diagnosis not present

## 2016-02-25 ENCOUNTER — Ambulatory Visit (INDEPENDENT_AMBULATORY_CARE_PROVIDER_SITE_OTHER): Payer: Medicare Other | Admitting: Orthopaedic Surgery

## 2016-02-25 ENCOUNTER — Encounter (INDEPENDENT_AMBULATORY_CARE_PROVIDER_SITE_OTHER): Payer: Self-pay | Admitting: Orthopaedic Surgery

## 2016-02-25 VITALS — BP 139/69 | HR 64 | Resp 14 | Ht 61.0 in | Wt 101.0 lb

## 2016-02-25 DIAGNOSIS — G8929 Other chronic pain: Secondary | ICD-10-CM | POA: Diagnosis not present

## 2016-02-25 DIAGNOSIS — M25562 Pain in left knee: Secondary | ICD-10-CM

## 2016-02-25 DIAGNOSIS — M25561 Pain in right knee: Secondary | ICD-10-CM

## 2016-02-25 MED ORDER — LIDOCAINE HCL 1 % IJ SOLN
5.0000 mL | INTRAMUSCULAR | Status: AC | PRN
  Administered 2016-02-25: 5 mL

## 2016-02-25 MED ORDER — BUPIVACAINE HCL 0.5 % IJ SOLN
3.0000 mL | INTRAMUSCULAR | Status: AC | PRN
  Administered 2016-02-25: 3 mL via INTRA_ARTICULAR

## 2016-02-25 MED ORDER — METHYLPREDNISOLONE ACETATE 40 MG/ML IJ SUSP
40.0000 mg | INTRAMUSCULAR | Status: AC | PRN
  Administered 2016-02-25: 40 mg via INTRA_ARTICULAR

## 2016-02-25 NOTE — Progress Notes (Signed)
Office Visit Note   Patient: Andrea Gilmore           Date of Birth: [birth year]           MRN: [id number] Visit Date: 02/25/2016              Requested by: Mayra Neer, MD [address], [place] [address] PCP: Mayra Neer, MD   Assessment & Plan: Visit Diagnoses: Bilateral knee pain most likely osteoarthritis  Plan: Cortisone injection both knees, follow-up as needed.. If the injections are not helpful than I would suggest she return and we'll obtain new x-rays of both knees.  Follow-Up Instructions: No Follow-up on file.   Orders:  No orders of the defined types were placed in this encounter.  No orders of the defined types were placed in this encounter.     Procedures: Large Joint Inj Date/Time: 02/25/2016 4:51 PM Performed by: Garald Balding Authorized by: Garald Balding   Consent Given by:  Patient Timeout: prior to procedure the correct patient, procedure, and site was verified   Indications:  Pain and joint swelling Location:  Knee Site:  R knee Prep: patient was prepped and draped in usual sterile fashion   Needle Size:  25 G Needle Length:  1.5 inches Approach:  Anteromedial Ultrasound Guidance: No   Fluoroscopic Guidance: No   Arthrogram: No   Medications:  3 mL bupivacaine 0.5 %; 5 mL lidocaine 1 %; 40 mg methylPREDNISolone acetate 40 MG/ML Aspiration Attempted: No   Patient tolerance:  Patient tolerated the procedure well with no immediate complications       Large Joint Inj Date/Time: 02/25/2016 4:57 PM Performed by: Garald Balding Authorized by: Garald Balding   Consent Given by:  Patient Timeout: prior to procedure the correct patient, procedure, and site was verified   Indications:  Pain and joint swelling Location:  Knee Site:  L knee Prep: patient was prepped and draped in usual sterile fashion   Needle Size:  25 G Needle Length:  1.5 inches Approach:  Anteromedial Ultrasound Guidance: No    Fluoroscopic Guidance: No   Arthrogram: No   Medications:  5 mL lidocaine 1 %; 3 mL bupivacaine 0.5 %; 40 mg methylPREDNISolone acetate 40 MG/ML Aspiration Attempted: No   Patient tolerance:  Patient tolerated the procedure well with no immediate complications     Clinical Data: No additional findings.   Subjective: Chief Complaint  Patient presents with  . Right Knee - Pain    Pt complaining of chronic right knee pain. She would like an injection today. She in intolerant of narcotics Pt ambulates with a cane and holding onto wall as she walks.  Multiple problems in past related to her age. She still lives independently. She had a reverse shoulder arthroplasty by Dr. Tamera Punt in July. The problem with rotator cuff arthropathy on the right. Has a problem with balance. Wears bilateral ankle-foot orthotics and has had chronic problems with her back. Now complaining of bilateral knee pain without injury or trauma.  Review of Systems   Objective: Vital Signs: Resp 14   Ht 5\' 1"  (1.549 m)   Wt 101 lb (45.8 kg)   BMI 19.08 kg/m   Physical Exam  Ortho Exam bilateral knee exam with full extension. Mild increased varus. Predominantly medial joint pain without effusion. Some patellar crepitation. Flexion over 105. No instability. Wearing bilateral ankle-foot orthotics. These were not removed and thus exam was limited distally.  Specialty  Comments:  No specialty comments available.  Imaging: No results found.   PMFS History: Patient Active Problem List   Diagnosis Date Noted  . Takotsubo syndrome 12/08/2014  . S/p reverse total shoulder arthroplasty   . Cardiomyopathy ([place])   . Iron deficiency anemia   . Aspiration pneumonia due to inhalation of milk ([place])   . Secondary cardiomyopathy ([place])   . Acute cystitis without hematuria   . Hypoxia   . Blood poisoning ([place])   . Aspiration pneumonia due to food (regurgitated) ([place])   . Chronic kidney disease, stage III  (moderate)   . UTI (lower urinary tract infection)   . Anemia, iron deficiency   . Acute blood loss anemia   . Acute encephalopathy   . Protein-calorie malnutrition, severe ([place]) 08/01/2014  . Acute systolic heart failure ([place])   . Respiratory failure ([place])   . NSTEMI (non-ST elevated myocardial infarction) ([place])   . Protein-calorie malnutrition ([place])   . Other specified hypothyroidism   . Acute pulmonary edema (HCC)   . Pleural effusion, left   . CKD (chronic kidney disease), stage III 07/28/2014  . Metabolic encephalopathy [id number]  . Acute delirium 07/28/2014  . Fever 07/28/2014  . Acute respiratory failure with hypoxia ([place]) 07/28/2014  . Metabolic acidosis [id number]  . Sepsis ([place]) 07/28/2014  . Left rotator cuff tear arthropathy 07/27/2014  . History of iron deficiency anemia 04/14/2013  . Acute renal failure ([place]) 04/14/2013  . Primary open angle glaucoma 04/08/2012  . History of breast cancer 06/17/2011   Past Medical History:  Diagnosis Date  . Arthritis   . Breast cancer ([place])    breast cancer  . Cardiomyopathy ([place])    a. possibly ischemic >> NSTEMI after should surgery in 07/2014 >>Echo 07/29/14:  EF 65%;  EF 08/01/14:  EF 20-25%, ant-septal AK, Gr 2 DD, mod MR, mod LAE, mod TR, PASP 63 mmHg  . Chronic systolic CHF (congestive heart failure) ([place])   . CKD (chronic kidney disease)   . History of echocardiogram    Echo 9/16:  Vigorous LVF, EF 65-70%, Gr 1 DD, mild MR, mild LAE, PASP 32 mmHg  . Hypothyroidism     Family History  Problem Relation Age of Onset  . Brain cancer Mother   . Cancer - Lung Father     Past Surgical History:  Procedure Laterality Date  . ABDOMINAL HYSTERECTOMY    . BACK SURGERY    . BILATERAL CARPAL TUNNEL RELEASE    . CHOLECYSTECTOMY    . EYE SURGERY Right   . REVERSE SHOULDER ARTHROPLASTY Left 07/27/2014   Procedure: REVERSE SHOULDER ARTHROPLASTY;  Surgeon: Tania Ade, MD;  Location: [facility];  Service: Orthopedics;  Laterality:  Left;  Left reverse total shoulder arthroplasty  . SHOULDER SURGERY Bilateral   . TONSILLECTOMY     Social History   Occupational History  . Not on file.   Social History Main Topics  . Smoking status: Never Smoker  . Smokeless tobacco: Never Used  . Alcohol use No  . Drug use: No  . Sexual activity: Not Currently

## 2016-03-19 DIAGNOSIS — G4700 Insomnia, unspecified: Secondary | ICD-10-CM | POA: Diagnosis not present

## 2016-03-19 DIAGNOSIS — Z853 Personal history of malignant neoplasm of breast: Secondary | ICD-10-CM | POA: Diagnosis not present

## 2016-03-19 DIAGNOSIS — H409 Unspecified glaucoma: Secondary | ICD-10-CM | POA: Diagnosis not present

## 2016-03-19 DIAGNOSIS — Z0000 Encounter for general adult medical examination without abnormal findings: Secondary | ICD-10-CM | POA: Diagnosis not present

## 2016-03-19 DIAGNOSIS — M21372 Foot drop, left foot: Secondary | ICD-10-CM | POA: Diagnosis not present

## 2016-03-19 DIAGNOSIS — E039 Hypothyroidism, unspecified: Secondary | ICD-10-CM | POA: Diagnosis not present

## 2016-03-19 DIAGNOSIS — M159 Polyosteoarthritis, unspecified: Secondary | ICD-10-CM | POA: Diagnosis not present

## 2016-03-19 DIAGNOSIS — M810 Age-related osteoporosis without current pathological fracture: Secondary | ICD-10-CM | POA: Diagnosis not present

## 2016-03-19 DIAGNOSIS — I10 Essential (primary) hypertension: Secondary | ICD-10-CM | POA: Diagnosis not present

## 2016-03-19 DIAGNOSIS — M21371 Foot drop, right foot: Secondary | ICD-10-CM | POA: Diagnosis not present

## 2016-03-19 DIAGNOSIS — M549 Dorsalgia, unspecified: Secondary | ICD-10-CM | POA: Diagnosis not present

## 2016-03-19 DIAGNOSIS — K5730 Diverticulosis of large intestine without perforation or abscess without bleeding: Secondary | ICD-10-CM | POA: Diagnosis not present

## 2016-03-20 ENCOUNTER — Telehealth (INDEPENDENT_AMBULATORY_CARE_PROVIDER_SITE_OTHER): Payer: Self-pay | Admitting: Orthopaedic Surgery

## 2016-03-20 NOTE — Telephone Encounter (Signed)
Patient called stating that her right knee feels like it is going out of joint.  Dr. Durward Fortes wanted to x-ray her knees.  She believes that she needs to get that done now.  CB#[phone].  Thank you.

## 2016-03-20 NOTE — Telephone Encounter (Signed)
Please advise 

## 2016-03-20 NOTE — Telephone Encounter (Signed)
Patient is scheduled for March 26

## 2016-03-20 NOTE — Telephone Encounter (Signed)
Needs to make an appointment for that

## 2016-03-20 NOTE — Telephone Encounter (Signed)
tx pt to front desk to make an appointment.

## 2016-03-27 DIAGNOSIS — M48061 Spinal stenosis, lumbar region without neurogenic claudication: Secondary | ICD-10-CM | POA: Diagnosis not present

## 2016-03-27 DIAGNOSIS — M4726 Other spondylosis with radiculopathy, lumbar region: Secondary | ICD-10-CM | POA: Diagnosis not present

## 2016-03-27 DIAGNOSIS — M5136 Other intervertebral disc degeneration, lumbar region: Secondary | ICD-10-CM | POA: Diagnosis not present

## 2016-03-31 ENCOUNTER — Ambulatory Visit (INDEPENDENT_AMBULATORY_CARE_PROVIDER_SITE_OTHER): Payer: Medicare Other | Admitting: Orthopaedic Surgery

## 2016-03-31 ENCOUNTER — Ambulatory Visit (INDEPENDENT_AMBULATORY_CARE_PROVIDER_SITE_OTHER): Payer: Medicare Other

## 2016-03-31 ENCOUNTER — Ambulatory Visit (INDEPENDENT_AMBULATORY_CARE_PROVIDER_SITE_OTHER): Payer: Self-pay

## 2016-03-31 DIAGNOSIS — M25562 Pain in left knee: Secondary | ICD-10-CM

## 2016-03-31 DIAGNOSIS — M25561 Pain in right knee: Secondary | ICD-10-CM

## 2016-03-31 DIAGNOSIS — G8929 Other chronic pain: Secondary | ICD-10-CM

## 2016-03-31 NOTE — Progress Notes (Signed)
Office Visit Note   Patient: Andrea Gilmore           Date of Birth: November 04, 1925           MRN: [id number] Visit Date: 03/31/2016              Requested by: Mayra Neer, MD [address] [place], [place] [address] PCP: Mayra Neer, MD   Assessment & Plan: Visit Diagnoses: Bilateral osteoarthritis knees-near end-stage right side  Plan: Spider brace right knee, long discussion regarding knee replacement and other treatment options. Andrea Gilmore has had prior viscose supplementation without much relief. She also has had analgesics per her primary care physician with significant side effects. Also uses bilateral ankle-foot orthotics to assist with ambulation. They need to be replaced and we'll give her prescription for that at [facility] Instructions: No Follow-up on file.   Orders:  No orders of the defined types were placed in this encounter.  No orders of the defined types were placed in this encounter.     Procedures: No procedures performed   Clinical Data: No additional findings.   Subjective: No chief complaint on file.   Andrea Gilmore is an 83 year old female that has chronic bilateral knee pain. Right worse than left.  Xrays obtained today. Dr. Nonah Mattes injected her lumbar spine on 03/27/16.  Andrea Gilmore is been seen on multiple occasions for the problem with both of her knees. She has near end-stage osteoarthritis. She's had prior cortisone injections and viscose supplementation with minimal relief. She does wear bilateral ankle foot orthotics to assist with her ambulation. She is just frustrated because her mind seems so clear and yet her body is "failing. Long discussion regarding potential complications of knee replacement. Certainly I would not recommend this. Problem is going to be relief of pain over time given the significance of her osteoarthritis. Hopefully the brace will help Review of Systems   Objective: Vital Signs:  There were no vitals taken for this visit.  Physical Exam  Ortho Exam thin female oriented to person place and time . Right knee with minimal lack of extension. Flexes over 105 without instability but with increased varus. No instability. No swelling distally. Has weakness with dorsiflexion of both feet. No knee effusion   Imaging: No results found.   PMFS History: Patient Active Problem List   Diagnosis Date Noted  . Takotsubo syndrome 12/08/2014  . S/p reverse total shoulder arthroplasty   . Cardiomyopathy ([place])   . Iron deficiency anemia   . Aspiration pneumonia due to inhalation of milk ([place])   . Secondary cardiomyopathy ([place])   . Acute cystitis without hematuria   . Hypoxia   . Blood poisoning ([place])   . Aspiration pneumonia due to food (regurgitated) ([place])   . Chronic kidney disease, stage III (moderate)   . UTI (lower urinary tract infection)   . Anemia, iron deficiency   . Acute blood loss anemia   . Acute encephalopathy   . Protein-calorie malnutrition, severe ([place]) 08/01/2014  . Acute systolic heart failure ([place])   . Respiratory failure ([place])   . NSTEMI (non-ST elevated myocardial infarction) ([place])   . Protein-calorie malnutrition ([place])   . Other specified hypothyroidism   . Acute pulmonary edema (HCC)   . Pleural effusion, left   . CKD (chronic kidney disease), stage III 07/28/2014  . Metabolic encephalopathy 27/74/1287  . Acute delirium 07/28/2014  . Fever 07/28/2014  . Acute respiratory failure with hypoxia (  [place]) 07/28/2014  . Metabolic acidosis 88/11/313  . Sepsis ([place]) 07/28/2014  . Left rotator cuff tear arthropathy 07/27/2014  . History of iron deficiency anemia 04/14/2013  . Acute renal failure ([place]) 04/14/2013  . Primary open angle glaucoma 04/08/2012  . History of breast cancer 06/17/2011   Past Medical History:  Diagnosis Date  . Arthritis   . Breast cancer ([place])    breast cancer  . Cardiomyopathy ([place])    a. possibly ischemic >> NSTEMI  after should surgery in 07/2014 >>Echo 07/29/14:  EF 65%;  EF 08/01/14:  EF 20-25%, ant-septal AK, Gr 2 DD, mod MR, mod LAE, mod TR, PASP 63 mmHg  . Chronic systolic CHF (congestive heart failure) ([place])   . CKD (chronic kidney disease)   . History of echocardiogram    Echo 9/16:  Vigorous LVF, EF 65-70%, Gr 1 DD, mild MR, mild LAE, PASP 32 mmHg  . Hypothyroidism     Family History  Problem Relation Age of Onset  . Brain cancer Mother   . Cancer - Lung Father     Past Surgical History:  Procedure Laterality Date  . ABDOMINAL HYSTERECTOMY    . BACK SURGERY    . BILATERAL CARPAL TUNNEL RELEASE    . CHOLECYSTECTOMY    . EYE SURGERY Right   . REVERSE SHOULDER ARTHROPLASTY Left 07/27/2014   Procedure: REVERSE SHOULDER ARTHROPLASTY;  Surgeon: Tania Ade, MD;  Location: [facility];  Service: Orthopedics;  Laterality: Left;  Left reverse total shoulder arthroplasty  . SHOULDER SURGERY Bilateral   . TONSILLECTOMY     Social History   Occupational History  . Not on file.   Social History Main Topics  . Smoking status: Never Smoker  . Smokeless tobacco: Never Used  . Alcohol use No  . Drug use: No  . Sexual activity: Not Currently

## 2016-04-02 ENCOUNTER — Telehealth: Payer: Self-pay | Admitting: Orthopaedic Surgery

## 2016-04-02 DIAGNOSIS — M25512 Pain in left shoulder: Secondary | ICD-10-CM | POA: Diagnosis not present

## 2016-04-02 NOTE — Telephone Encounter (Signed)
Patient is having an issue with her Knee brace. She states this brace is not for her.  It causes pain and is asking if there is something else she could use.  Please call and advise.  Please wait until tomorrow to call.  Patient is aware that PW is not in the office now.

## 2016-04-03 NOTE — Telephone Encounter (Signed)
I do not think any brace will work for her-could send to [facility]

## 2016-04-03 NOTE — Telephone Encounter (Signed)
Please advise 

## 2016-04-08 NOTE — Telephone Encounter (Signed)
She has an appointment for her braces at [facility] on Friday. I will send over a RX for the right knee.

## 2016-05-04 IMAGING — CR DG SHOULDER 1V*L*
1 series · 1 of 1 positions shown · non-contrast
Comparison: July 27, 2014

CLINICAL DATA: Pain

EXAM:
LEFT SHOULDER - 1 VIEW

[AP]
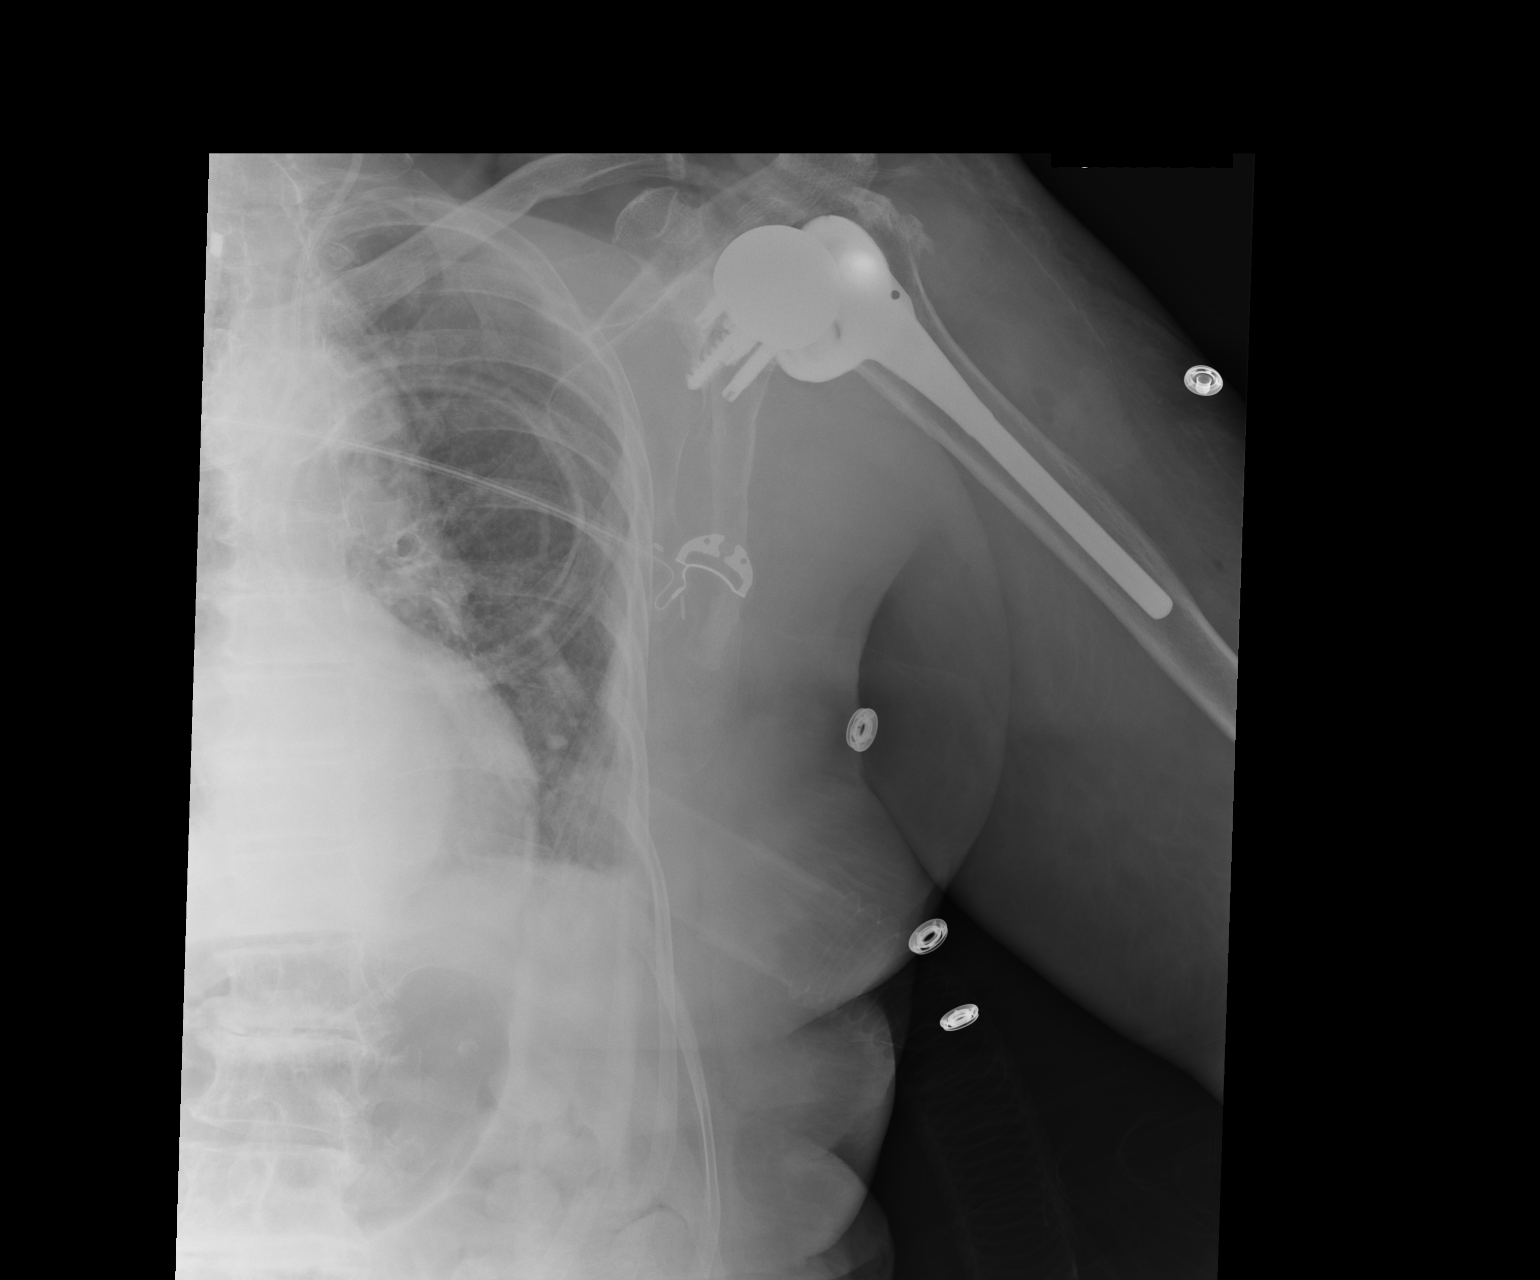

[1 of 1 positions shown; findings below may reference images not displayed]

FINDINGS: Frontal view obtained. There is a total shoulder replacement
prosthesis on the left with alignment anatomic on this single view.
No fracture or dislocation apparent. There is evidence of old trauma
involving the lateral left clavicle with widening between the
clavicle and acromion, stable. No coracoclavicular separation seen
on this study.
IMPRESSION: No change in total shoulder prosthesis alignment on frontal view. No
acute fracture or dislocation. Stable widening between the lateral
left clavicle and acromion with evidence of old trauma involving the
lateral left clavicle.

## 2016-05-04 IMAGING — CT CT EXTREM UP ENTIRE ARM*L* W/O CM
3 of 4 series · 12 of 36 positions shown, 13 images · non-contrast
Comparison: Radiograph dated 07/28/2014 and CT dated 07/14/2014

CLINICAL DATA: 89-year-old female with left shoulder reverse
arthroplasty. Evaluate for hematoma

EXAM:
CT OF THE UPPER LEFT ARM WITHOUT CONTRAST
TECHNIQUE: Multidetector CT imaging was performed according to the standard
protocol. Multiplanar CT image reconstructions were also generated.

[Series 4: lfov ext 3.0 b40s · axial · 0.45mm/px · z∈[+832,+1069]mm · 3 of 129 slices shown, 4 images]
[im 30/129  soft-tissue]
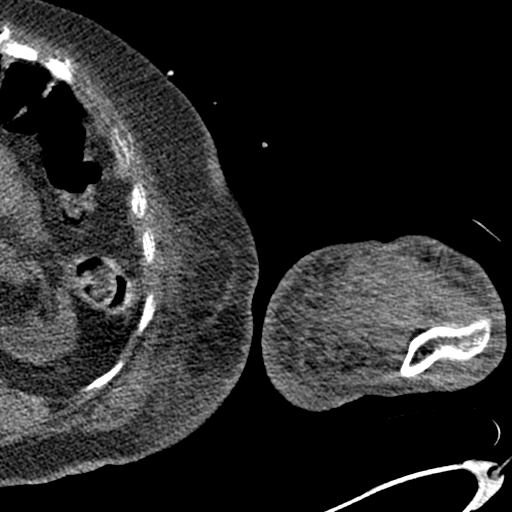
[im 30/129  bone]
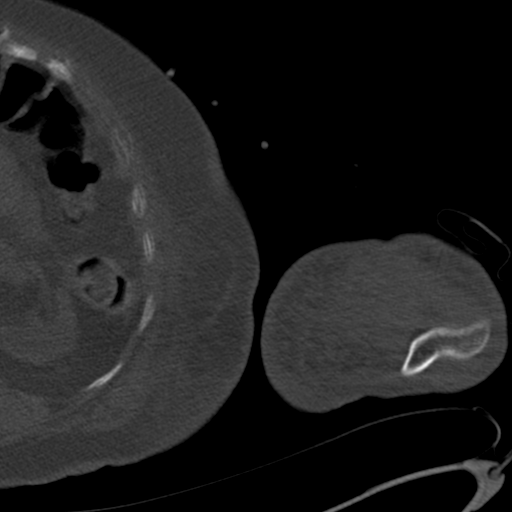
[im 69/129  bone]
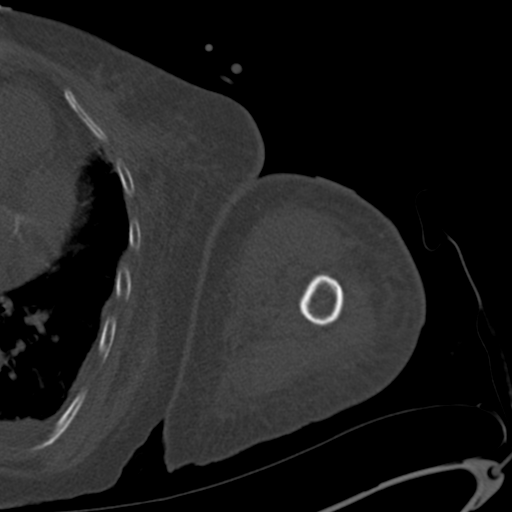
[im 109/129  bone]
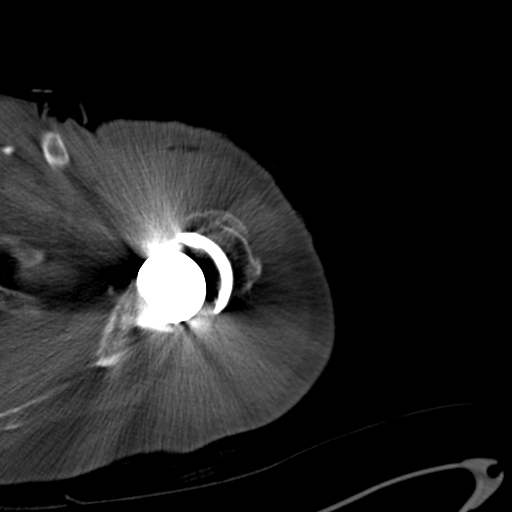

[Series 9: coronalsoft tissue · coronal · 0.43mm/px · 3 of 93 slices shown]
[im 19/93  bone]
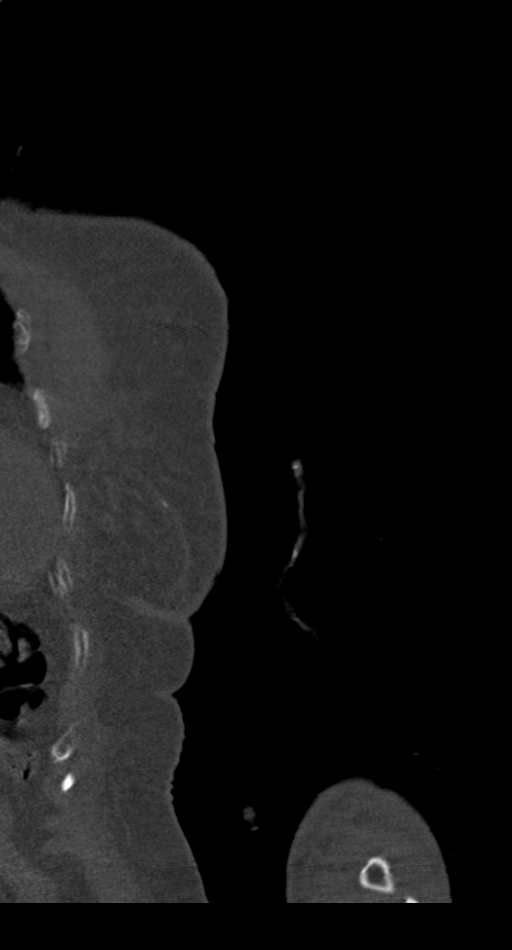
[im 37/93  bone]
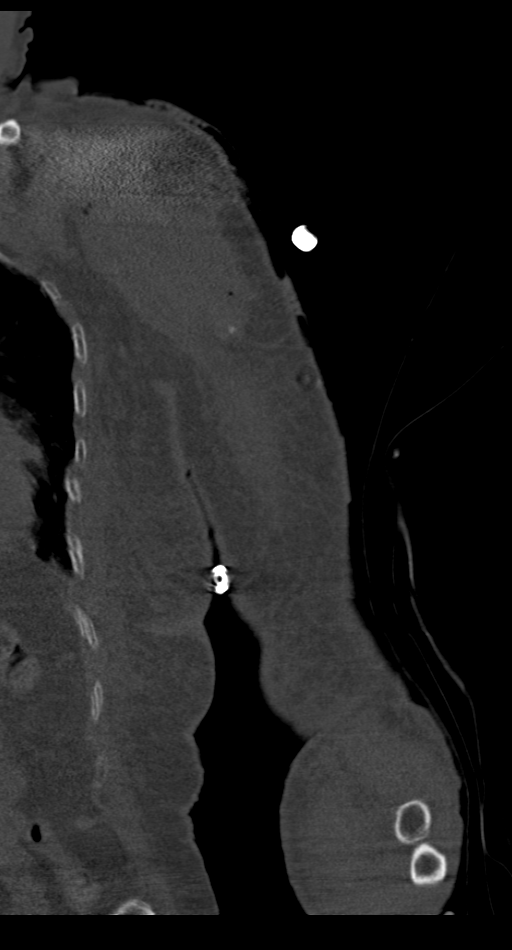
[im 56/93  bone]
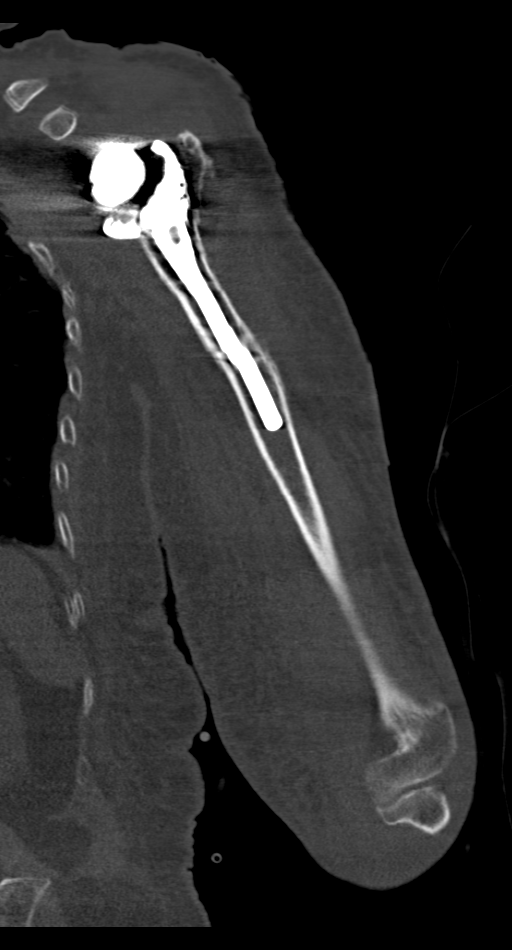

[Series 10: sagittalsoft tissue · sagittal · 0.44mm/px · 6 of 93 slices shown]
[im 29/93  bone]
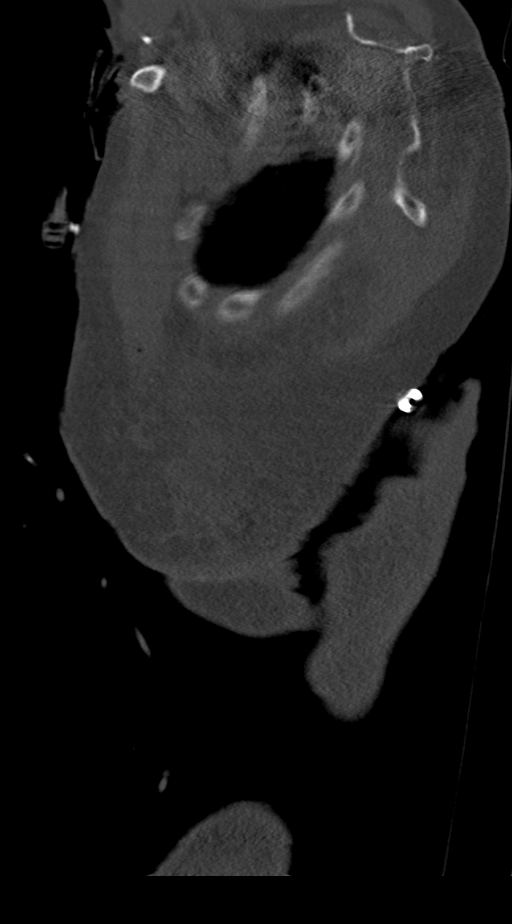
[im 40/93  bone]
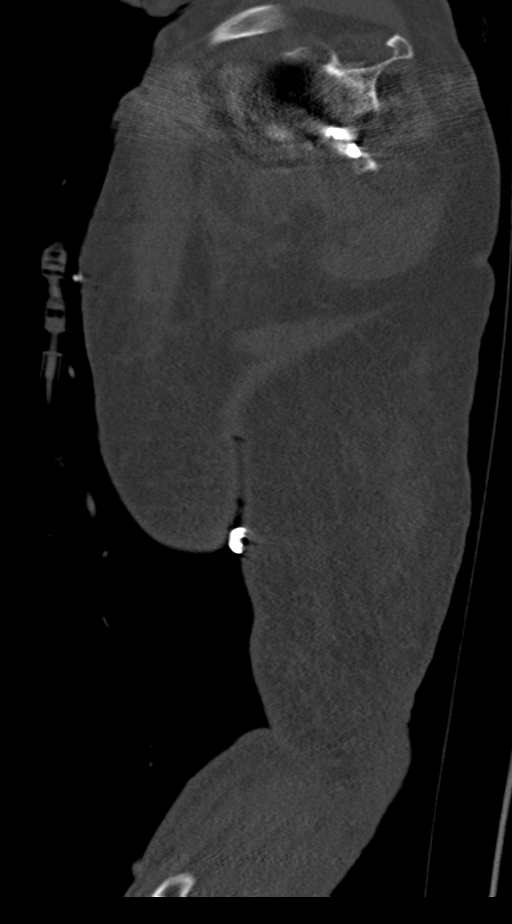
[im 50/93  bone]
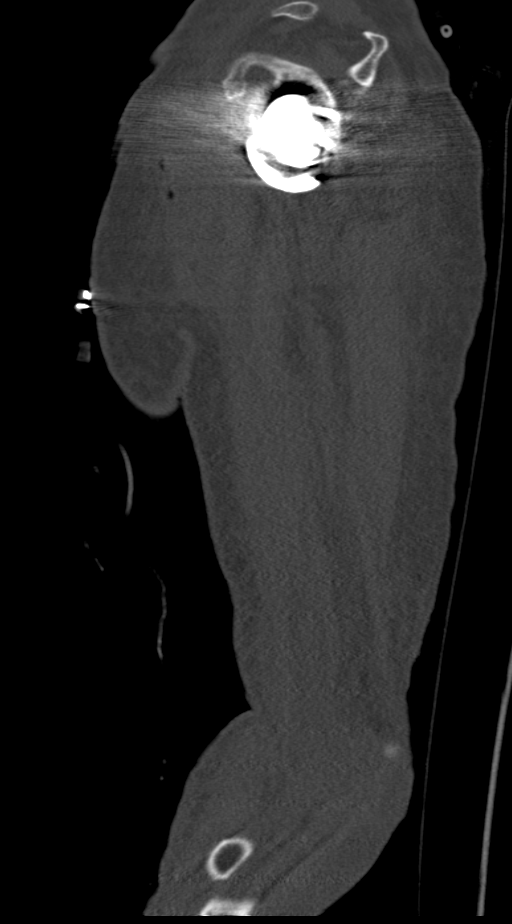
[im 51/93  soft-tissue]
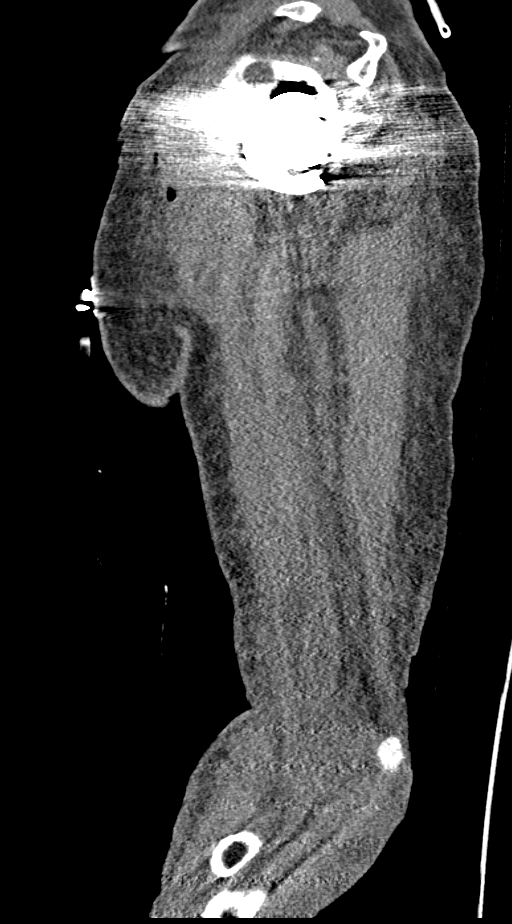
[im 61/93  bone]
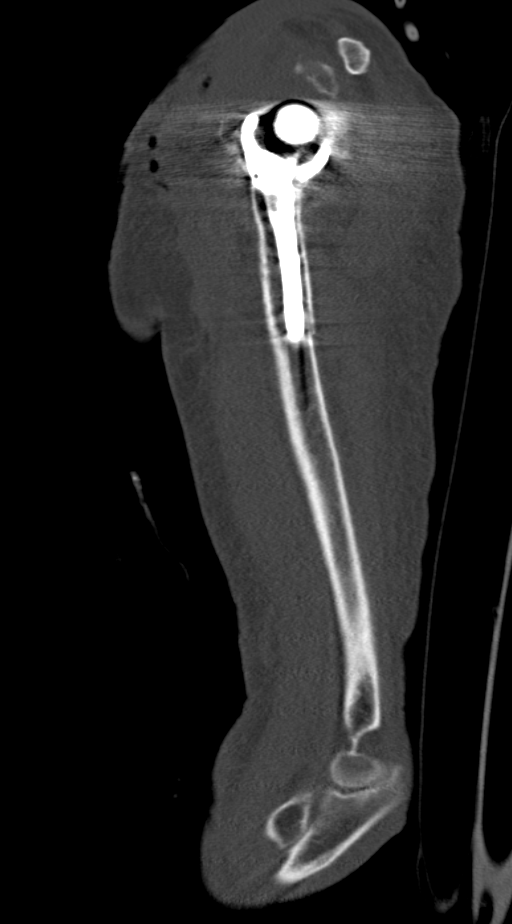
[im 71/93  bone]
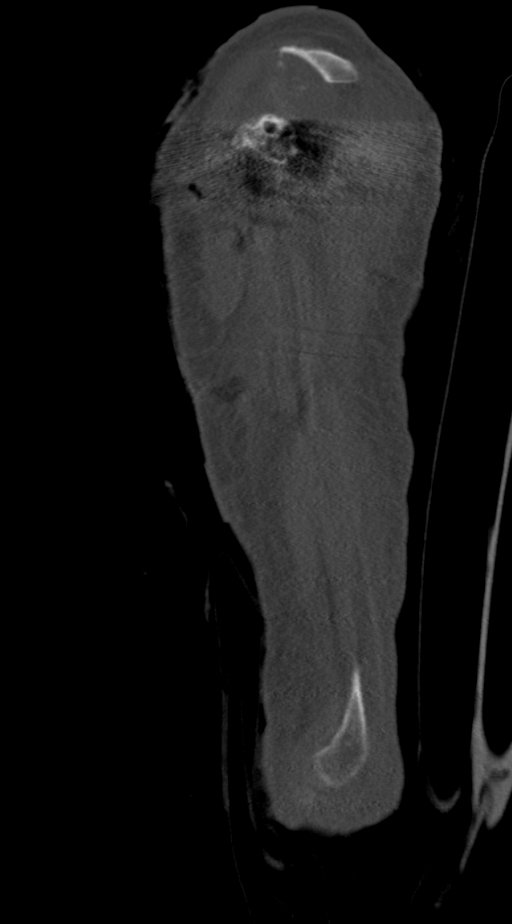

[12 of 36 positions shown; findings below may reference images not displayed]

FINDINGS: Evaluation very limited due to streak artifact caused by metallic
arthroplasty.

There is a total left shoulder reverse arthroplasty. There is no
evidence of dislocation. There is extensive osteoarthritic changes
of the proximal humerus. No acute fracture. Multiple small bone
fragments superior to the arthroplasty are similar to prior study
and may represent ectopic bone formation or loose bodies. There is
diffuse soft tissue edema with small fluid surrounding the joint. No
evidence of large hematoma. There is postsurgical changes of the
soft tissues in the anterior upper extremity with small pockets of
soft tissue air. There is diffuse subcutaneous soft tissue
stranding.

A small left pleural effusion may be present.
IMPRESSION: Postsurgical changes of left shoulder reverse arthroplasty. No
significant fluid collection or hematoma identified.

## 2016-05-10 IMAGING — CR DG CHEST 1V PORT
1 series · 1 of 1 positions shown · non-contrast
Comparison: 07/31/2014

CLINICAL DATA: Pulmonary edema

EXAM:
PORTABLE CHEST - 1 VIEW

[AP]
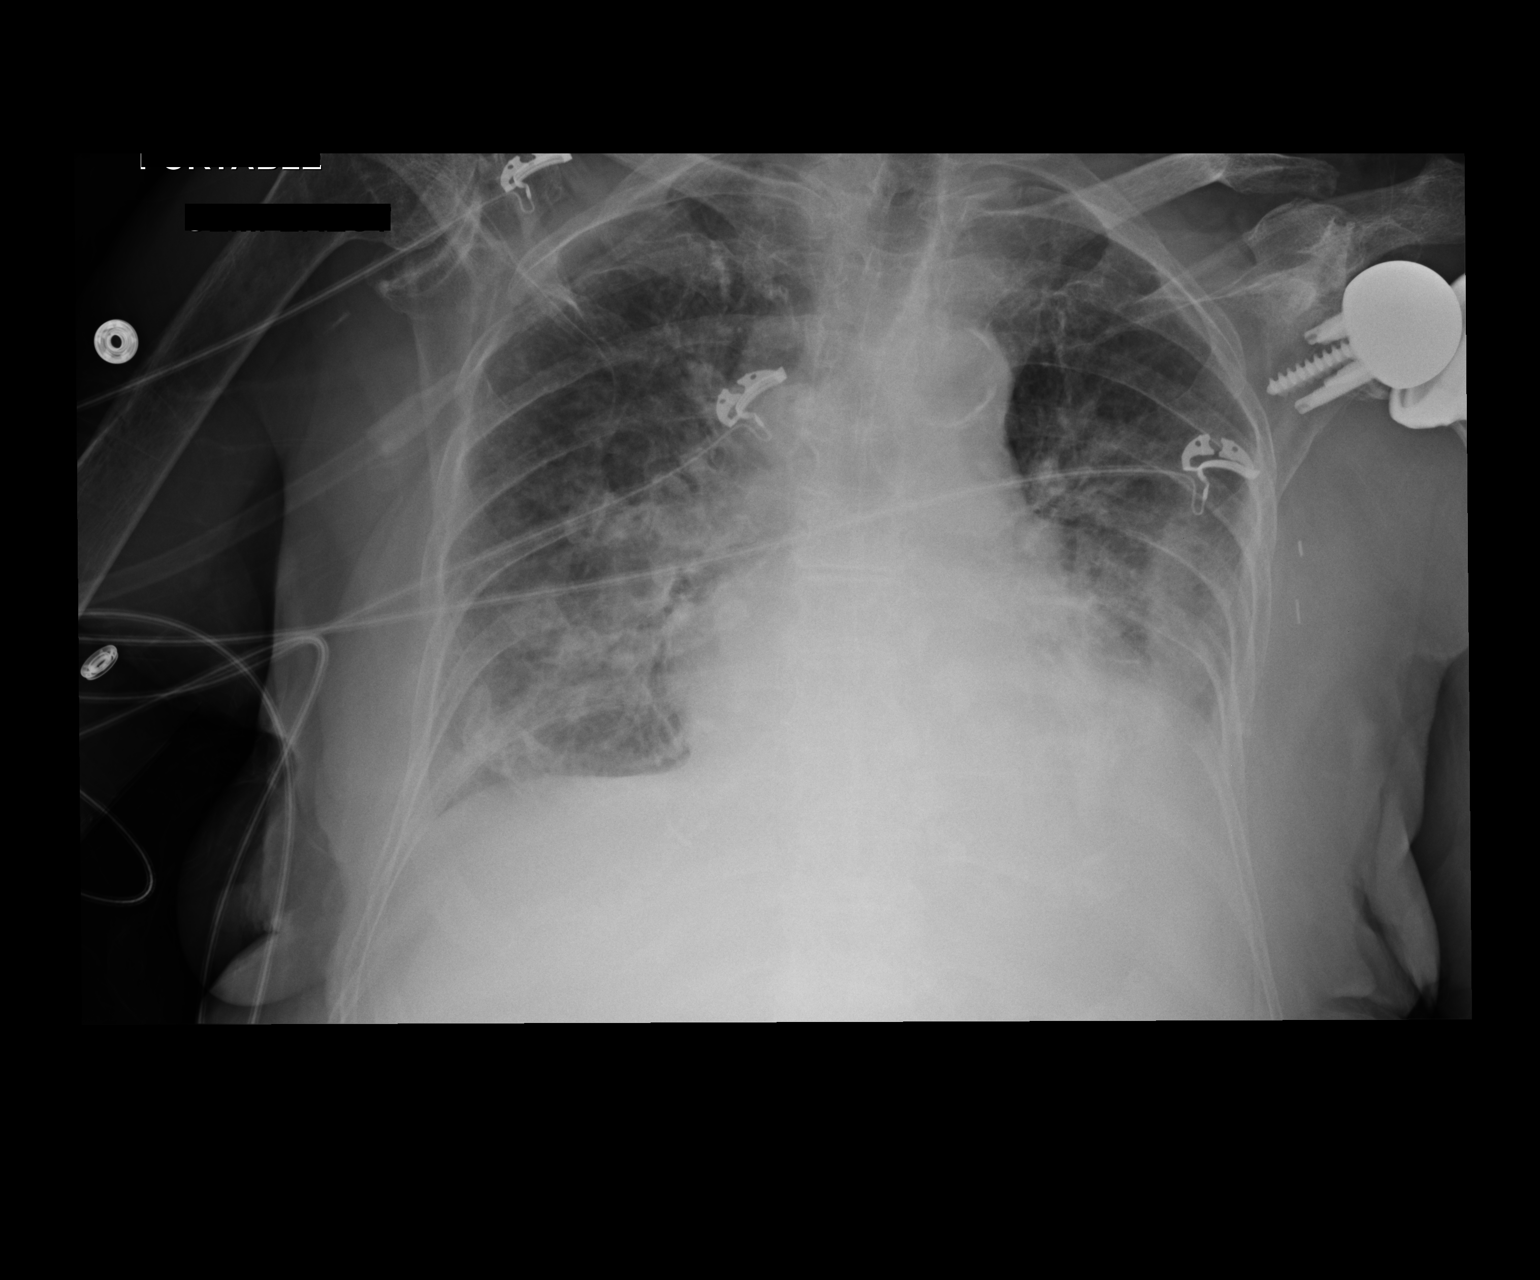

[1 of 1 positions shown; findings below may reference images not displayed]

FINDINGS: Cardiac shadow is at the upper limits of normal in size but stable.
Vascular congestion and pulmonary edema is noted although slightly
improved when compared with the prior exam. Increasing early
infiltrate is noted in the left lung base. A small left pleural
effusion is noted as well. No acute bony abnormality is seen.
IMPRESSION: Vascular congestion and pulmonary edema although slightly improved
when compare with the prior exam.

Increasing left basilar infiltrate.

## 2016-05-21 ENCOUNTER — Telehealth: Payer: Self-pay | Admitting: Orthopaedic Surgery

## 2016-05-21 NOTE — Telephone Encounter (Signed)
Patient would like to know if she is able to get cortisone injections in her right shoulder and right knee. She doesn't remember when she last had injections. Please advise.

## 2016-05-22 NOTE — Telephone Encounter (Signed)
Please advise 

## 2016-05-22 NOTE — Telephone Encounter (Signed)
lvmom with note from BP

## 2016-05-22 NOTE — Telephone Encounter (Signed)
Right shoulder injection anytime should like. For the right knee she should wait at least another month.

## 2016-05-27 ENCOUNTER — Ambulatory Visit (INDEPENDENT_AMBULATORY_CARE_PROVIDER_SITE_OTHER): Payer: Medicare Other | Admitting: Orthopaedic Surgery

## 2016-05-27 ENCOUNTER — Encounter (INDEPENDENT_AMBULATORY_CARE_PROVIDER_SITE_OTHER): Payer: Self-pay | Admitting: Orthopaedic Surgery

## 2016-05-27 VITALS — BP 164/63 | HR 68 | Resp 14 | Ht 61.0 in | Wt 101.0 lb

## 2016-05-27 DIAGNOSIS — G8929 Other chronic pain: Secondary | ICD-10-CM

## 2016-05-27 DIAGNOSIS — M25511 Pain in right shoulder: Secondary | ICD-10-CM | POA: Diagnosis not present

## 2016-05-27 MED ORDER — BUPIVACAINE HCL 0.5 % IJ SOLN
2.0000 mL | INTRAMUSCULAR | Status: AC | PRN
  Administered 2016-05-27: 2 mL via INTRA_ARTICULAR

## 2016-05-27 MED ORDER — METHYLPREDNISOLONE ACETATE 40 MG/ML IJ SUSP
80.0000 mg | INTRAMUSCULAR | Status: AC | PRN
  Administered 2016-05-27: 80 mg

## 2016-05-27 MED ORDER — LIDOCAINE HCL 1 % IJ SOLN
2.0000 mL | INTRAMUSCULAR | Status: AC | PRN
  Administered 2016-05-27: 2 mL

## 2016-05-27 NOTE — Progress Notes (Signed)
Office Visit Note   Patient: Andrea Gilmore           Date of Birth: 03-29-1925           MRN: [id number] Visit Date: 05/27/2016              Requested by: Mayra Neer, MD [address] [place], [place] [address] PCP: Mayra Neer, MD   Assessment & Plan: Visit Diagnoses:  1. Chronic right shoulder pain   End-stage rotator cuff arthropathy  Plan: Cortisone injection right shoulder,  return as needed. Pre cert Visco supplementation right knee  Follow-Up Instructions: Return if symptoms worsen or fail to improve.   Orders:  No orders of the defined types were placed in this encounter.  No orders of the defined types were placed in this encounter.     Procedures: Large Joint Inj Date/Time: 05/27/2016 12:07 PM Performed by: Garald Balding Authorized by: Garald Balding   Consent Given by:  Patient Timeout: prior to procedure the correct patient, procedure, and site was verified   Indications:  Pain Location:  Shoulder Site:  R glenohumeral Prep: patient was prepped and draped in usual sterile fashion   Needle Size:  25 G Needle Length:  1.5 inches Approach:  Posterior Ultrasound Guidance: No   Fluoroscopic Guidance: No   Arthrogram: No   Medications:  2 mL lidocaine 1 %; 2 mL bupivacaine 0.5 %; 80 mg methylPREDNISolone acetate 40 MG/ML Aspiration Attempted: No   Patient tolerance:  Patient tolerated the procedure well with no immediate complications     Clinical Data: No additional findings.   Subjective: Chief Complaint  Patient presents with  . Right Shoulder - Pain    [name] isa n 83 y o that presents with chronic Right shoulder pain. She wants a R shoulder injections today.She relates she also has R knee pain but know she cannot get 2 injections.  Andrea Gilmore is been seen on multiple occasions for the problems referable to both of her shoulders. She presently is having more trouble on the right. Prior films of diagnoses are  consistent with end-stage rotator cuff arthropathy. She's had cortisone injections in the past which gave her some temporary relief of her pain. She also has received Visco supplementation in both of her knees through [facility] in the past and she was inquiring as to whether or not she could receive them here in this office particularly in her right knee which is more symptomatic now  HPI  Review of Systems   Objective: Vital Signs: BP (!) 164/63   Pulse 68   Resp 14   Ht 5\' 1"  (1.549 m)   Wt 101 lb (45.8 kg)   BMI 19.08 kg/m   Physical Exam  Ortho Exam pain and limitation of motion right shoulder with crepitation. Considerable weakness with internal rotation. Skin intact. Multiple well-healed incisions. Good grip and release right hand  Specialty Comments:  No specialty comments available.  Imaging: No results found.   PMFS History: Patient Active Problem List   Diagnosis Date Noted  . Takotsubo syndrome 12/08/2014  . S/p reverse total shoulder arthroplasty   . Cardiomyopathy ([place])   . Iron deficiency anemia   . Aspiration pneumonia due to inhalation of milk ([place])   . Secondary cardiomyopathy ([place])   . Acute cystitis without hematuria   . Hypoxia   . Blood poisoning   . Aspiration pneumonia due to food (regurgitated) ([place])   . Chronic kidney  disease, stage III (moderate)   . UTI (lower urinary tract infection)   . Anemia, iron deficiency   . Acute blood loss anemia   . Acute encephalopathy   . Protein-calorie malnutrition, severe ([place]) 08/01/2014  . Acute systolic heart failure ([place])   . Respiratory failure ([place])   . NSTEMI (non-ST elevated myocardial infarction) ([place])   . Protein-calorie malnutrition ([place])   . Other specified hypothyroidism   . Acute pulmonary edema (HCC)   . Pleural effusion, left   . CKD (chronic kidney disease), stage III 07/28/2014  . Metabolic encephalopathy 91/79/1505  . Acute delirium 07/28/2014  . Fever 07/28/2014  .  Acute respiratory failure with hypoxia ([place]) 07/28/2014  . Metabolic acidosis 69/79/4801  . Sepsis ([place]) 07/28/2014  . Left rotator cuff tear arthropathy 07/27/2014  . History of iron deficiency anemia 04/14/2013  . Acute renal failure ([place]) 04/14/2013  . Primary open angle glaucoma 04/08/2012  . History of breast cancer 06/17/2011   Past Medical History:  Diagnosis Date  . Arthritis   . Breast cancer ([place])    breast cancer  . Cardiomyopathy ([place])    a. possibly ischemic >> NSTEMI after should surgery in 07/2014 >>Echo 07/29/14:  EF 65%;  EF 08/01/14:  EF 20-25%, ant-septal AK, Gr 2 DD, mod MR, mod LAE, mod TR, PASP 63 mmHg  . Chronic systolic CHF (congestive heart failure) ([place])   . CKD (chronic kidney disease)   . History of echocardiogram    Echo 9/16:  Vigorous LVF, EF 65-70%, Gr 1 DD, mild MR, mild LAE, PASP 32 mmHg  . Hypothyroidism     Family History  Problem Relation Age of Onset  . Brain cancer Mother   . Cancer - Lung Father     Past Surgical History:  Procedure Laterality Date  . ABDOMINAL HYSTERECTOMY    . BACK SURGERY    . BILATERAL CARPAL TUNNEL RELEASE    . CHOLECYSTECTOMY    . EYE SURGERY Right   . REVERSE SHOULDER ARTHROPLASTY Left 07/27/2014   Procedure: REVERSE SHOULDER ARTHROPLASTY;  Surgeon: Tania Ade, MD;  Location: [facility];  Service: Orthopedics;  Laterality: Left;  Left reverse total shoulder arthroplasty  . SHOULDER SURGERY Bilateral   . TONSILLECTOMY     Social History   Occupational History  . Not on file.   Social History Main Topics  . Smoking status: Never Smoker  . Smokeless tobacco: Never Used  . Alcohol use No  . Drug use: No  . Sexual activity: Not Currently     Garald Balding, MD   Note - This record has been created using [employer].  Chart creation errors have been sought, but may not always  have been located. Such creation errors do not reflect on  the standard of medical care.

## 2016-06-03 DIAGNOSIS — M5136 Other intervertebral disc degeneration, lumbar region: Secondary | ICD-10-CM | POA: Diagnosis not present

## 2016-06-03 DIAGNOSIS — M48062 Spinal stenosis, lumbar region with neurogenic claudication: Secondary | ICD-10-CM | POA: Diagnosis not present

## 2016-06-03 DIAGNOSIS — R030 Elevated blood-pressure reading, without diagnosis of hypertension: Secondary | ICD-10-CM | POA: Diagnosis not present

## 2016-06-03 DIAGNOSIS — M47816 Spondylosis without myelopathy or radiculopathy, lumbar region: Secondary | ICD-10-CM | POA: Diagnosis not present

## 2016-06-17 ENCOUNTER — Telehealth (INDEPENDENT_AMBULATORY_CARE_PROVIDER_SITE_OTHER): Payer: Self-pay | Admitting: *Deleted

## 2016-06-17 NOTE — Telephone Encounter (Signed)
Please call patient and schedule Euflexxa inj X 3 bil buy and bill with Aaron Edelman on Wednesday's. Thank you.

## 2016-06-18 ENCOUNTER — Ambulatory Visit (INDEPENDENT_AMBULATORY_CARE_PROVIDER_SITE_OTHER): Payer: Medicare Other | Admitting: Orthopedic Surgery

## 2016-06-19 DIAGNOSIS — Z853 Personal history of malignant neoplasm of breast: Secondary | ICD-10-CM | POA: Diagnosis not present

## 2016-06-19 DIAGNOSIS — Z1231 Encounter for screening mammogram for malignant neoplasm of breast: Secondary | ICD-10-CM | POA: Diagnosis not present

## 2016-06-20 ENCOUNTER — Ambulatory Visit (INDEPENDENT_AMBULATORY_CARE_PROVIDER_SITE_OTHER): Payer: Medicare Other | Admitting: Orthopaedic Surgery

## 2016-06-20 DIAGNOSIS — M25511 Pain in right shoulder: Secondary | ICD-10-CM | POA: Diagnosis not present

## 2016-06-20 DIAGNOSIS — M25561 Pain in right knee: Secondary | ICD-10-CM | POA: Diagnosis not present

## 2016-06-20 DIAGNOSIS — G8929 Other chronic pain: Secondary | ICD-10-CM

## 2016-06-20 MED ORDER — LIDOCAINE HCL 1 % IJ SOLN
5.0000 mL | INTRAMUSCULAR | Status: AC | PRN
  Administered 2016-06-20: 5 mL

## 2016-06-20 MED ORDER — METHYLPREDNISOLONE ACETATE 40 MG/ML IJ SUSP
80.0000 mg | INTRAMUSCULAR | Status: AC | PRN
  Administered 2016-06-20: 80 mg

## 2016-06-20 MED ORDER — BUPIVACAINE HCL 0.5 % IJ SOLN
2.0000 mL | INTRAMUSCULAR | Status: AC | PRN
  Administered 2016-06-20: 2 mL via INTRA_ARTICULAR

## 2016-06-20 MED ORDER — LIDOCAINE HCL 1 % IJ SOLN
2.0000 mL | INTRAMUSCULAR | Status: AC | PRN
  Administered 2016-06-20: 2 mL

## 2016-06-20 MED ORDER — BUPIVACAINE HCL 0.5 % IJ SOLN
3.0000 mL | INTRAMUSCULAR | Status: AC | PRN
  Administered 2016-06-20: 3 mL via INTRA_ARTICULAR

## 2016-06-20 NOTE — Progress Notes (Signed)
Office Visit Note   Patient: Andrea Gilmore           Date of Birth: December 28, 1925           MRN: [id number] Visit Date: 06/20/2016              Requested by: Andrea Neer, MD [address] [place],  [address] PCP: Andrea Neer, MD   Assessment & Plan: Visit Diagnoses:  1. Chronic pain of right knee   2. Chronic right shoulder pain   End-stage arthritis right knee right shoulder  Plan: Cortisone injection right knee and right shoulder. Return to the office in 2 weeks to start Euflexxa and either right and/or both knees  Follow-Up Instructions: Return in about 2 weeks (around 07/04/2016).   Orders:  No orders of the defined types were placed in this encounter.  No orders of the defined types were placed in this encounter.     Procedures: Large Joint Inj Date/Time: 06/20/2016 10:55 AM Performed by: Andrea Gilmore Authorized by: Andrea Gilmore   Consent Given by:  Patient Timeout: prior to procedure the correct patient, procedure, and site was verified   Indications:  Pain and joint swelling Location:  Knee Site:  R knee Prep: patient was prepped and draped in usual sterile fashion   Needle Size:  25 G Needle Length:  1.5 inches Approach:  Anteromedial Ultrasound Guidance: No   Fluoroscopic Guidance: No   Arthrogram: No   Medications:  3 mL bupivacaine 0.5 %; 5 mL lidocaine 1 %; 80 mg methylPREDNISolone acetate 40 MG/ML Aspiration Attempted: No   Patient tolerance:  Patient tolerated the procedure well with no immediate complications  Large Joint Inj Date/Time: 06/20/2016 10:56 AM Performed by: Andrea Gilmore Authorized by: Andrea Gilmore   Consent Given by:  Patient Timeout: prior to procedure the correct patient, procedure, and site was verified   Indications:  Pain Location:  Shoulder Site:  R glenohumeral Prep: patient was prepped and draped in usual sterile fashion   Needle Size:  25 G Needle Length:  1.5  inches Approach:  Posterior Ultrasound Guidance: No   Fluoroscopic Guidance: No   Arthrogram: No   Medications:  2 mL lidocaine 1 %; 2 mL bupivacaine 0.5 %; 80 mg methylPREDNISolone acetate 40 MG/ML Aspiration Attempted: No   Patient tolerance:  Patient tolerated the procedure well with no immediate complications     Clinical Data: No additional findings.   Subjective: Chief Complaint  Patient presents with  . Right Knee - Pain  . Right Shoulder - Pain  Andrea Gilmore has evidence of end-stage osteoarthritis of her right knee. On occasion she's had cortisone injections and even Visco supplementation with some temporary relief of her pain. She does use a cane for ambulation. She lives by herself. She also has rotator cuff arthropathy of her right shoulder and received an occasional cortisone injection  HPI  Review of Systems   Objective: Vital Signs: There were no vitals taken for this visit.  Physical Exam  Ortho Exam right shoulder with limited range of motion. Actively only about 70 of abduction and flexion. There is crepitation. Well-healed incisions. Some swelling. No essential change from prior evaluations. Multiple degenerative changes in both hands with some deformities related to osteoarthritis. Motor and sensory exam intact. Right knee with predominantly medial joint pain lacks a few degrees to full extension and flexes about 103 or 4. Some patellar crepitation. There is bilateral ankle foot orthotics  Specialty Comments:  No specialty comments available.  Imaging: No results found.   PMFS History: Patient Active Problem List   Diagnosis Date Noted  . Takotsubo syndrome 12/08/2014  . S/p reverse total shoulder arthroplasty   . Cardiomyopathy ([place])   . Iron deficiency anemia   . Aspiration pneumonia due to inhalation of milk ([place])   . Secondary cardiomyopathy ([place])   . Acute cystitis without hematuria   . Hypoxia   . Blood poisoning   . Aspiration  pneumonia due to food (regurgitated) ([place])   . Chronic kidney disease, stage III (moderate)   . UTI (lower urinary tract infection)   . Anemia, iron deficiency   . Acute blood loss anemia   . Acute encephalopathy   . Protein-calorie malnutrition, severe ([place]) 08/01/2014  . Acute systolic heart failure ([place])   . Respiratory failure ([place])   . NSTEMI (non-ST elevated myocardial infarction) ([place])   . Protein-calorie malnutrition ([place])   . Other specified hypothyroidism   . Acute pulmonary edema (HCC)   . Pleural effusion, left   . CKD (chronic kidney disease), stage III 07/28/2014  . Metabolic encephalopathy 85/63/1497  . Acute delirium 07/28/2014  . Fever 07/28/2014  . Acute respiratory failure with hypoxia ([place]) 07/28/2014  . Metabolic acidosis 02/63/7858  . Sepsis ([place]) 07/28/2014  . Left rotator cuff tear arthropathy 07/27/2014  . History of iron deficiency anemia 04/14/2013  . Acute renal failure ([place]) 04/14/2013  . Primary open angle glaucoma 04/08/2012  . History of breast cancer 06/17/2011   Past Medical History:  Diagnosis Date  . Arthritis   . Breast cancer ([place])    breast cancer  . Cardiomyopathy ([place])    a. possibly ischemic >> NSTEMI after should surgery in 07/2014 >>Echo 07/29/14:  EF 65%;  EF 08/01/14:  EF 20-25%, ant-septal AK, Gr 2 DD, mod MR, mod LAE, mod TR, PASP 63 mmHg  . Chronic systolic CHF (congestive heart failure) ([place])   . CKD (chronic kidney disease)   . History of echocardiogram    Echo 9/16:  Vigorous LVF, EF 65-70%, Gr 1 DD, mild MR, mild LAE, PASP 32 mmHg  . Hypothyroidism     Family History  Problem Relation Age of Onset  . Brain cancer Mother   . Cancer - Lung Father     Past Surgical History:  Procedure Laterality Date  . ABDOMINAL HYSTERECTOMY    . BACK SURGERY    . BILATERAL CARPAL TUNNEL RELEASE    . CHOLECYSTECTOMY    . EYE SURGERY Right   . REVERSE SHOULDER ARTHROPLASTY Left 07/27/2014   Procedure: REVERSE SHOULDER ARTHROPLASTY;   Surgeon: Andrea Ade, MD;  Location: [facility];  Service: Orthopedics;  Laterality: Left;  Left reverse total shoulder arthroplasty  . SHOULDER SURGERY Bilateral   . TONSILLECTOMY     Social History   Occupational History  . Not on file.   Social History Main Topics  . Smoking status: Never Smoker  . Smokeless tobacco: Never Used  . Alcohol use No  . Drug use: No  . Sexual activity: Not Currently     Andrea Balding, MD   Note - This record has been created using [employer].  Chart creation errors have been sought, but may not always  have been located. Such creation errors do not reflect on  the standard of medical care.

## 2016-06-23 ENCOUNTER — Ambulatory Visit (INDEPENDENT_AMBULATORY_CARE_PROVIDER_SITE_OTHER): Payer: Medicare Other | Admitting: Orthopaedic Surgery

## 2016-06-25 ENCOUNTER — Ambulatory Visit (INDEPENDENT_AMBULATORY_CARE_PROVIDER_SITE_OTHER): Payer: Medicare Other | Admitting: Orthopedic Surgery

## 2016-06-27 ENCOUNTER — Telehealth: Payer: Self-pay | Admitting: *Deleted

## 2016-06-27 NOTE — Telephone Encounter (Signed)
Please call patient and schedule BIL x 3 Euflexxa with Aaron Edelman on Wednesday's, buy and bill. Thank you.

## 2016-06-30 ENCOUNTER — Ambulatory Visit (INDEPENDENT_AMBULATORY_CARE_PROVIDER_SITE_OTHER): Payer: Medicare Other | Admitting: Orthopaedic Surgery

## 2016-06-30 DIAGNOSIS — M25561 Pain in right knee: Principal | ICD-10-CM

## 2016-06-30 DIAGNOSIS — M1711 Unilateral primary osteoarthritis, right knee: Secondary | ICD-10-CM

## 2016-06-30 DIAGNOSIS — G8929 Other chronic pain: Secondary | ICD-10-CM | POA: Diagnosis not present

## 2016-06-30 MED ORDER — SODIUM HYALURONATE (VISCOSUP) 20 MG/2ML IX SOSY
20.0000 mg | PREFILLED_SYRINGE | INTRA_ARTICULAR | Status: AC | PRN
  Administered 2016-06-30: 20 mg via INTRA_ARTICULAR

## 2016-06-30 NOTE — Progress Notes (Signed)
Office Visit Note   Patient: Andrea Gilmore           Date of Birth: 1925/01/15           MRN: [id number] Visit Date: 06/30/2016              Requested by: Mayra Neer, MD [address] [place], [place] [address] PCP: Mayra Neer, MD   Assessment & Plan: Visit Diagnoses:  1. Chronic pain of right knee   osteoarthritis  Plan: Euflexxa injections-start today right knee  Follow-Up Instructions: Return in about 1 week (around 07/07/2016).   Orders:  No orders of the defined types were placed in this encounter.  No orders of the defined types were placed in this encounter.     Procedures: Large Joint Inj Date/Time: 06/30/2016 3:24 PM Performed by: Garald Balding Authorized by: Garald Balding   Consent Given by:  Patient Timeout: prior to procedure the correct patient, procedure, and site was verified   Indications:  Pain and joint swelling Location:  Knee Site:  R knee Prep: patient was prepped and draped in usual sterile fashion   Needle Size:  25 G Needle Length:  1.5 inches Approach:  Anteromedial Ultrasound Guidance: No   Fluoroscopic Guidance: No   Arthrogram: No   Medications:  20 mg Sodium Hyaluronate 20 MG/2ML Aspiration Attempted: No   Patient tolerance:  Patient tolerated the procedure well with no immediate complications     Clinical Data: No additional findings.   Subjective: Chief Complaint  Patient presents with  . Right Knee - Pain    HPI  Review of Systems   Objective: Vital Signs: There were no vitals taken for this visit.  Physical Exam  Ortho Exam  Specialty Comments:  No specialty comments available.  Imaging: No results found.   PMFS History: Patient Active Problem List   Diagnosis Date Noted  . Takotsubo syndrome 12/08/2014  . S/p reverse total shoulder arthroplasty   . Cardiomyopathy ([place])   . Iron deficiency anemia   . Aspiration pneumonia due to inhalation of milk ([place])   .  Secondary cardiomyopathy ([place])   . Acute cystitis without hematuria   . Hypoxia   . Blood poisoning   . Aspiration pneumonia due to food (regurgitated) ([place])   . Chronic kidney disease, stage III (moderate)   . UTI (lower urinary tract infection)   . Anemia, iron deficiency   . Acute blood loss anemia   . Acute encephalopathy   . Protein-calorie malnutrition, severe ([place]) 08/01/2014  . Acute systolic heart failure ([place])   . Respiratory failure ([place])   . NSTEMI (non-ST elevated myocardial infarction) ([place])   . Protein-calorie malnutrition ([place])   . Other specified hypothyroidism   . Acute pulmonary edema (HCC)   . Pleural effusion, left   . CKD (chronic kidney disease), stage III 07/28/2014  . Metabolic encephalopathy 56/38/9373  . Acute delirium 07/28/2014  . Fever 07/28/2014  . Acute respiratory failure with hypoxia ([place]) 07/28/2014  . Metabolic acidosis 42/87/6811  . Sepsis ([place]) 07/28/2014  . Left rotator cuff tear arthropathy 07/27/2014  . History of iron deficiency anemia 04/14/2013  . Acute renal failure ([place]) 04/14/2013  . Primary open angle glaucoma 04/08/2012  . History of breast cancer 06/17/2011   Past Medical History:  Diagnosis Date  . Arthritis   . Breast cancer ([place])    breast cancer  . Cardiomyopathy ([place])    a. possibly ischemic >> NSTEMI after  should surgery in 07/2014 >>Echo 07/29/14:  EF 65%;  EF 08/01/14:  EF 20-25%, ant-septal AK, Gr 2 DD, mod MR, mod LAE, mod TR, PASP 63 mmHg  . Chronic systolic CHF (congestive heart failure) ([place])   . CKD (chronic kidney disease)   . History of echocardiogram    Echo 9/16:  Vigorous LVF, EF 65-70%, Gr 1 DD, mild MR, mild LAE, PASP 32 mmHg  . Hypothyroidism     Family History  Problem Relation Age of Onset  . Brain cancer Mother   . Cancer - Lung Father     Past Surgical History:  Procedure Laterality Date  . ABDOMINAL HYSTERECTOMY    . BACK SURGERY    . BILATERAL CARPAL TUNNEL RELEASE    . CHOLECYSTECTOMY      . EYE SURGERY Right   . REVERSE SHOULDER ARTHROPLASTY Left 07/27/2014   Procedure: REVERSE SHOULDER ARTHROPLASTY;  Surgeon: Tania Ade, MD;  Location: [facility];  Service: Orthopedics;  Laterality: Left;  Left reverse total shoulder arthroplasty  . SHOULDER SURGERY Bilateral   . TONSILLECTOMY     Social History   Occupational History  . Not on file.   Social History Main Topics  . Smoking status: Never Smoker  . Smokeless tobacco: Never Used  . Alcohol use No  . Drug use: No  . Sexual activity: Not Currently     Garald Balding, MD   Note - This record has been created using [employer].  Chart creation errors have been sought, but may not always  have been located. Such creation errors do not reflect on  the standard of medical care.

## 2016-07-02 ENCOUNTER — Ambulatory Visit (INDEPENDENT_AMBULATORY_CARE_PROVIDER_SITE_OTHER): Payer: Medicare Other | Admitting: Orthopedic Surgery

## 2016-07-02 ENCOUNTER — Telehealth (INDEPENDENT_AMBULATORY_CARE_PROVIDER_SITE_OTHER): Payer: Self-pay | Admitting: Orthopaedic Surgery

## 2016-07-02 NOTE — Telephone Encounter (Signed)
Patient received an injection during her last appointment, she stated that she has been doing some house work and is not experiencing some pain in the same knee she received the injection.  She would like to know if that is normal.  CB#[id number].  Thank you.

## 2016-07-03 NOTE — Telephone Encounter (Signed)
Called per PW and let her know all was OK

## 2016-07-11 ENCOUNTER — Ambulatory Visit (INDEPENDENT_AMBULATORY_CARE_PROVIDER_SITE_OTHER): Payer: Medicare Other | Admitting: Orthopaedic Surgery

## 2016-07-11 DIAGNOSIS — G8929 Other chronic pain: Secondary | ICD-10-CM | POA: Diagnosis not present

## 2016-07-11 DIAGNOSIS — M25561 Pain in right knee: Principal | ICD-10-CM

## 2016-07-11 DIAGNOSIS — M1711 Unilateral primary osteoarthritis, right knee: Secondary | ICD-10-CM | POA: Diagnosis not present

## 2016-07-11 MED ORDER — SODIUM HYALURONATE (VISCOSUP) 20 MG/2ML IX SOSY
20.0000 mg | PREFILLED_SYRINGE | INTRA_ARTICULAR | Status: AC | PRN
  Administered 2016-07-11: 20 mg via INTRA_ARTICULAR

## 2016-07-11 NOTE — Progress Notes (Signed)
Office Visit Note   Patient: Andrea Gilmore           Date of Birth: 10/03/1925           MRN: [id number] Visit Date: 07/11/2016              Requested by: Mayra Neer, MD [address] [place], [place] [address] PCP: Mayra Neer, MD   Assessment & Plan: Visit Diagnoses:  1. Chronic pain of right knee    Osteoarthritis right knee Plan: Second E uflexxa injection. Office 1 week to complete series  Follow-Up Instructions: Return in about 1 week (around 07/18/2016).   Orders:  No orders of the defined types were placed in this encounter.  No orders of the defined types were placed in this encounter.     Procedures: Large Joint Inj Date/Time: 07/11/2016 11:23 AM Performed by: Garald Balding Authorized by: Garald Balding   Consent Given by:  Patient Timeout: prior to procedure the correct patient, procedure, and site was verified   Indications:  Pain and joint swelling Location:  Knee Site:  R knee Prep: patient was prepped and draped in usual sterile fashion   Needle Size:  25 G Needle Length:  1.5 inches Approach:  Anteromedial Ultrasound Guidance: No   Fluoroscopic Guidance: No   Arthrogram: No   Medications:  20 mg Sodium Hyaluronate 20 MG/2ML Aspiration Attempted: No   Patient tolerance:  Patient tolerated the procedure well with no immediate complications     Clinical Data: No additional findings.   Subjective: Chief Complaint  Patient presents with  . Right Knee - Injections  No problems referable to first  e injection right knee uflexxa HPI  Review of Systems   Objective: Vital Signs: There were no vitals taken for this visit.  Physical Exam  Ortho Exam right knee not hot red or swollen. No loss of motion since last injection  Specialty Comments:  No specialty comments available.  Imaging: No results found.   PMFS History: Patient Active Problem List   Diagnosis Date Noted  . Takotsubo syndrome  12/08/2014  . S/p reverse total shoulder arthroplasty   . Cardiomyopathy ([place])   . Iron deficiency anemia   . Aspiration pneumonia due to inhalation of milk ([place])   . Secondary cardiomyopathy ([place])   . Acute cystitis without hematuria   . Hypoxia   . Blood poisoning   . Aspiration pneumonia due to food (regurgitated) ([place])   . Chronic kidney disease, stage III (moderate)   . UTI (lower urinary tract infection)   . Anemia, iron deficiency   . Acute blood loss anemia   . Acute encephalopathy   . Protein-calorie malnutrition, severe ([place]) 08/01/2014  . Acute systolic heart failure ([place])   . Respiratory failure ([place])   . NSTEMI (non-ST elevated myocardial infarction) ([place])   . Protein-calorie malnutrition ([place])   . Other specified hypothyroidism   . Acute pulmonary edema (HCC)   . Pleural effusion, left   . CKD (chronic kidney disease), stage III 07/28/2014  . Metabolic encephalopathy 99/24/2683  . Acute delirium 07/28/2014  . Fever 07/28/2014  . Acute respiratory failure with hypoxia ([place]) 07/28/2014  . Metabolic acidosis 41/96/2229  . Sepsis ([place]) 07/28/2014  . Left rotator cuff tear arthropathy 07/27/2014  . History of iron deficiency anemia 04/14/2013  . Acute renal failure ([place]) 04/14/2013  . Primary open angle glaucoma 04/08/2012  . History of breast cancer 06/17/2011   Past Medical History:  Diagnosis Date  . Arthritis   . Breast cancer ([place])    breast cancer  . Cardiomyopathy ([place])    a. possibly ischemic >> NSTEMI after should surgery in 07/2014 >>Echo 07/29/14:  EF 65%;  EF 08/01/14:  EF 20-25%, ant-septal AK, Gr 2 DD, mod MR, mod LAE, mod TR, PASP 63 mmHg  . Chronic systolic CHF (congestive heart failure) ([place])   . CKD (chronic kidney disease)   . History of echocardiogram    Echo 9/16:  Vigorous LVF, EF 65-70%, Gr 1 DD, mild MR, mild LAE, PASP 32 mmHg  . Hypothyroidism     Family History  Problem Relation Age of Onset  . Brain cancer Mother   . Cancer - Lung  Father     Past Surgical History:  Procedure Laterality Date  . ABDOMINAL HYSTERECTOMY    . BACK SURGERY    . BILATERAL CARPAL TUNNEL RELEASE    . CHOLECYSTECTOMY    . EYE SURGERY Right   . REVERSE SHOULDER ARTHROPLASTY Left 07/27/2014   Procedure: REVERSE SHOULDER ARTHROPLASTY;  Surgeon: Tania Ade, MD;  Location: [facility];  Service: Orthopedics;  Laterality: Left;  Left reverse total shoulder arthroplasty  . SHOULDER SURGERY Bilateral   . TONSILLECTOMY     Social History   Occupational History  . Not on file.   Social History Main Topics  . Smoking status: Never Smoker  . Smokeless tobacco: Never Used  . Alcohol use No  . Drug use: No  . Sexual activity: Not Currently     Garald Balding, MD   Note - This record has been created using [employer].  Chart creation errors have been sought, but may not always  have been located. Such creation errors do not reflect on  the standard of medical care.

## 2016-07-14 ENCOUNTER — Ambulatory Visit (INDEPENDENT_AMBULATORY_CARE_PROVIDER_SITE_OTHER): Payer: Medicare Other | Admitting: Orthopaedic Surgery

## 2016-07-21 ENCOUNTER — Ambulatory Visit (INDEPENDENT_AMBULATORY_CARE_PROVIDER_SITE_OTHER): Payer: Medicare Other | Admitting: Orthopaedic Surgery

## 2016-07-21 DIAGNOSIS — M1711 Unilateral primary osteoarthritis, right knee: Secondary | ICD-10-CM

## 2016-07-21 DIAGNOSIS — G8929 Other chronic pain: Secondary | ICD-10-CM

## 2016-07-21 DIAGNOSIS — M25561 Pain in right knee: Principal | ICD-10-CM

## 2016-07-21 MED ORDER — SODIUM HYALURONATE (VISCOSUP) 20 MG/2ML IX SOSY
20.0000 mg | PREFILLED_SYRINGE | INTRA_ARTICULAR | Status: AC | PRN
  Administered 2016-07-21: 20 mg via INTRA_ARTICULAR

## 2016-07-21 NOTE — Progress Notes (Signed)
Office Visit Note   Patient: Andrea Gilmore           Date of Birth: March 09, 1925           MRN: [id number] Visit Date: 07/21/2016              Requested by: Mayra Neer, MD [address] [place], [place] [address] PCP: Mayra Neer, MD   Assessment & Plan: Visit Diagnoses:  1. Chronic pain of right knee   Osteoarthritis right knee  Plan: Third Euflexxa  Injection. Office several weeks to evaluate left knee  Follow-Up Instructions: No Follow-up on file.   Orders:  No orders of the defined types were placed in this encounter.  No orders of the defined types were placed in this encounter.     Procedures: Large Joint Inj Date/Time: 07/21/2016 2:09 PM Performed by: Garald Balding Authorized by: Garald Balding   Consent Given by:  Patient Timeout: prior to procedure the correct patient, procedure, and site was verified   Indications:  Pain and joint swelling Location:  Knee Site:  R knee Prep: patient was prepped and draped in usual sterile fashion   Needle Size:  25 G Needle Length:  1.5 inches Approach:  Anteromedial Ultrasound Guidance: No   Fluoroscopic Guidance: No   Arthrogram: No   Medications:  20 mg Sodium Hyaluronate 20 MG/2ML Aspiration Attempted: No   Patient tolerance:  Patient tolerated the procedure well with no immediate complications     Clinical Data: No additional findings.   Subjective: No chief complaint on file.   HPI  Review of Systems   Objective: Vital Signs: There were no vitals taken for this visit.  Physical Exam  Ortho Exam  Specialty Comments:  No specialty comments available.  Imaging: No results found.   PMFS History: Patient Active Problem List   Diagnosis Date Noted  . Takotsubo syndrome 12/08/2014  . S/p reverse total shoulder arthroplasty   . Cardiomyopathy ([place])   . Iron deficiency anemia   . Aspiration pneumonia due to inhalation of milk ([place])   . Secondary  cardiomyopathy ([place])   . Acute cystitis without hematuria   . Hypoxia   . Blood poisoning   . Aspiration pneumonia due to food (regurgitated) ([place])   . Chronic kidney disease, stage III (moderate)   . UTI (lower urinary tract infection)   . Anemia, iron deficiency   . Acute blood loss anemia   . Acute encephalopathy   . Protein-calorie malnutrition, severe ([place]) 08/01/2014  . Acute systolic heart failure ([place])   . Respiratory failure ([place])   . NSTEMI (non-ST elevated myocardial infarction) ([place])   . Protein-calorie malnutrition ([place])   . Other specified hypothyroidism   . Acute pulmonary edema (HCC)   . Pleural effusion, left   . CKD (chronic kidney disease), stage III 07/28/2014  . Metabolic encephalopathy 46/50/3546  . Acute delirium 07/28/2014  . Fever 07/28/2014  . Acute respiratory failure with hypoxia ([place]) 07/28/2014  . Metabolic acidosis 56/81/2751  . Sepsis ([place]) 07/28/2014  . Left rotator cuff tear arthropathy 07/27/2014  . History of iron deficiency anemia 04/14/2013  . Acute renal failure ([place]) 04/14/2013  . Primary open angle glaucoma 04/08/2012  . History of breast cancer 06/17/2011   Past Medical History:  Diagnosis Date  . Arthritis   . Breast cancer ([place])    breast cancer  . Cardiomyopathy ([place])    a. possibly ischemic >> NSTEMI after should surgery in  07/2014 >>Echo 07/29/14:  EF 65%;  EF 08/01/14:  EF 20-25%, ant-septal AK, Gr 2 DD, mod MR, mod LAE, mod TR, PASP 63 mmHg  . Chronic systolic CHF (congestive heart failure) ([place])   . CKD (chronic kidney disease)   . History of echocardiogram    Echo 9/16:  Vigorous LVF, EF 65-70%, Gr 1 DD, mild MR, mild LAE, PASP 32 mmHg  . Hypothyroidism     Family History  Problem Relation Age of Onset  . Brain cancer Mother   . Cancer - Lung Father     Past Surgical History:  Procedure Laterality Date  . ABDOMINAL HYSTERECTOMY    . BACK SURGERY    . BILATERAL CARPAL TUNNEL RELEASE    . CHOLECYSTECTOMY    . EYE  SURGERY Right   . REVERSE SHOULDER ARTHROPLASTY Left 07/27/2014   Procedure: REVERSE SHOULDER ARTHROPLASTY;  Surgeon: Tania Ade, MD;  Location: [facility];  Service: Orthopedics;  Laterality: Left;  Left reverse total shoulder arthroplasty  . SHOULDER SURGERY Bilateral   . TONSILLECTOMY     Social History   Occupational History  . Not on file.   Social History Main Topics  . Smoking status: Never Smoker  . Smokeless tobacco: Never Used  . Alcohol use No  . Drug use: No  . Sexual activity: Not Currently     Garald Balding, MD   Note - This record has been created using [employer].  Chart creation errors have been sought, but may not always  have been located. Such creation errors do not reflect on  the standard of medical care.

## 2016-08-11 ENCOUNTER — Encounter (INDEPENDENT_AMBULATORY_CARE_PROVIDER_SITE_OTHER): Payer: Self-pay | Admitting: Orthopaedic Surgery

## 2016-08-11 ENCOUNTER — Ambulatory Visit (INDEPENDENT_AMBULATORY_CARE_PROVIDER_SITE_OTHER): Payer: Medicare Other | Admitting: Orthopaedic Surgery

## 2016-08-11 VITALS — BP 159/64 | HR 71 | Ht 62.0 in | Wt 101.0 lb

## 2016-08-11 DIAGNOSIS — M1711 Unilateral primary osteoarthritis, right knee: Secondary | ICD-10-CM | POA: Diagnosis not present

## 2016-08-11 DIAGNOSIS — M19011 Primary osteoarthritis, right shoulder: Secondary | ICD-10-CM | POA: Diagnosis not present

## 2016-08-11 MED ORDER — METHYLPREDNISOLONE ACETATE 40 MG/ML IJ SUSP
80.0000 mg | INTRAMUSCULAR | Status: AC | PRN
  Administered 2016-08-11: 80 mg

## 2016-08-11 MED ORDER — LIDOCAINE HCL 1 % IJ SOLN
2.0000 mL | INTRAMUSCULAR | Status: AC | PRN
  Administered 2016-08-11: 2 mL

## 2016-08-11 MED ORDER — BUPIVACAINE HCL 0.5 % IJ SOLN
2.0000 mL | INTRAMUSCULAR | Status: AC | PRN
  Administered 2016-08-11: 2 mL via INTRA_ARTICULAR

## 2016-08-11 MED ORDER — BUPIVACAINE HCL 0.5 % IJ SOLN
3.0000 mL | INTRAMUSCULAR | Status: AC | PRN
  Administered 2016-08-11: 3 mL via INTRA_ARTICULAR

## 2016-08-11 MED ORDER — LIDOCAINE HCL 1 % IJ SOLN
5.0000 mL | INTRAMUSCULAR | Status: AC | PRN
  Administered 2016-08-11: 5 mL

## 2016-08-11 NOTE — Progress Notes (Deleted)
Office Visit Note   Patient: DEJANIRA PAMINTUAN           Date of Birth: [birth year]           MRN: [id number] Visit Date: 08/11/2016              Requested by: Mayra Neer, MD [address] [place], [place] [address] PCP: Mayra Neer, MD   Assessment & Plan: Visit Diagnoses: No diagnosis found.  Plan: ***  Follow-Up Instructions: No Follow-up on file.   Orders:  No orders of the defined types were placed in this encounter.  No orders of the defined types were placed in this encounter.     Procedures: No procedures performed   Clinical Data: No additional findings.   Subjective: Chief Complaint  Patient presents with  . Right Knee - Pain    Ms. Mapes is an 83 y o that has R shoulder pain and R knee pain.  . Right Shoulder - Pain    HPI  Review of Systems  Constitutional: Negative for chills, fatigue and fever.  Eyes: Negative for itching.  Respiratory: Negative for chest tightness and shortness of breath.   Cardiovascular: Negative for chest pain, palpitations and leg swelling.  Gastrointestinal: Negative for blood in stool, constipation and diarrhea.  Genitourinary: Positive for frequency.  Musculoskeletal: Positive for back pain. Negative for joint swelling, neck pain and neck stiffness.  Neurological: Negative for dizziness and numbness.  Hematological: Does not bruise/bleed easily.  Psychiatric/Behavioral: The patient is not nervous/anxious.      Objective: Vital Signs: BP (!) 159/64   Pulse 71   Ht 5\' 2"  (1.575 m)   Wt 101 lb (45.8 kg)   BMI 18.47 kg/m   Physical Exam  Ortho Exam  Specialty Comments:  No specialty comments available.  Imaging: No results found.   PMFS History: Patient Active Problem List   Diagnosis Date Noted  . Takotsubo syndrome 12/08/2014  . S/p reverse total shoulder arthroplasty   . Cardiomyopathy ([place])   . Iron deficiency anemia   . Aspiration pneumonia due to inhalation of milk  ([place])   . Secondary cardiomyopathy ([place])   . Acute cystitis without hematuria   . Hypoxia   . Blood poisoning   . Aspiration pneumonia due to food (regurgitated) ([place])   . Chronic kidney disease, stage III (moderate)   . UTI (lower urinary tract infection)   . Anemia, iron deficiency   . Acute blood loss anemia   . Acute encephalopathy   . Protein-calorie malnutrition, severe ([place]) 08/01/2014  . Acute systolic heart failure ([place])   . Respiratory failure ([place])   . NSTEMI (non-ST elevated myocardial infarction) ([place])   . Protein-calorie malnutrition ([place])   . Other specified hypothyroidism   . Acute pulmonary edema (HCC)   . Pleural effusion, left   . CKD (chronic kidney disease), stage III 07/28/2014  . Metabolic encephalopathy 43/15/4008  . Acute delirium 07/28/2014  . Fever 07/28/2014  . Acute respiratory failure with hypoxia ([place]) 07/28/2014  . Metabolic acidosis 67/61/9509  . Sepsis ([place]) 07/28/2014  . Left rotator cuff tear arthropathy 07/27/2014  . History of iron deficiency anemia 04/14/2013  . Acute renal failure ([place]) 04/14/2013  . Primary open angle glaucoma 04/08/2012  . History of breast cancer 06/17/2011   Past Medical History:  Diagnosis Date  . Arthritis   . Breast cancer ([place])    breast cancer  . Cardiomyopathy ([place])    a. possibly ischemic >>  NSTEMI after should surgery in 07/2014 >>Echo 07/29/14:  EF 65%;  EF 08/01/14:  EF 20-25%, ant-septal AK, Gr 2 DD, mod MR, mod LAE, mod TR, PASP 63 mmHg  . Chronic systolic CHF (congestive heart failure) ([place])   . CKD (chronic kidney disease)   . History of echocardiogram    Echo 9/16:  Vigorous LVF, EF 65-70%, Gr 1 DD, mild MR, mild LAE, PASP 32 mmHg  . Hypothyroidism     Family History  Problem Relation Age of Onset  . Brain cancer Mother   . Cancer - Lung Father     Past Surgical History:  Procedure Laterality Date  . ABDOMINAL HYSTERECTOMY    . BACK SURGERY    . BILATERAL CARPAL TUNNEL RELEASE    .  CHOLECYSTECTOMY    . EYE SURGERY Right   . REVERSE SHOULDER ARTHROPLASTY Left 07/27/2014   Procedure: REVERSE SHOULDER ARTHROPLASTY;  Surgeon: Tania Ade, MD;  Location: [facility];  Service: Orthopedics;  Laterality: Left;  Left reverse total shoulder arthroplasty  . SHOULDER SURGERY Bilateral   . TONSILLECTOMY     Social History   Occupational History  . Not on file.   Social History Main Topics  . Smoking status: Never Smoker  . Smokeless tobacco: Never Used  . Alcohol use No  . Drug use: No  . Sexual activity: Not Currently

## 2016-08-11 NOTE — Progress Notes (Signed)
Office Visit Note   Patient: Andrea Gilmore           Date of Birth: [birth year]           MRN: [id number] Visit Date: 08/11/2016              Requested by: Mayra Neer, MD [address] [place],  [address] PCP: Mayra Neer, MD   Assessment & Plan: Visit Diagnoses:  1. Primary osteoarthritis, right shoulder   2. Unilateral primary osteoarthritis, right knee     Plan: Intra-articular cortisone injection right shoulder and right knee. Long discussion regarding diagnosis and treatment options which I believe are limited in terms of shoulder and her knee replacement  Follow-Up Instructions: Return if symptoms worsen or fail to improve.   Orders:  No orders of the defined types were placed in this encounter.  No orders of the defined types were placed in this encounter.     Procedures: Large Joint Inj Date/Time: 08/11/2016 4:10 PM Performed by: Andrea Gilmore Authorized by: Andrea Gilmore   Consent Given by:  Patient Timeout: prior to procedure the correct patient, procedure, and site was verified   Indications:  Pain and joint swelling Location:  Knee Site:  R knee Prep: patient was prepped and draped in usual sterile fashion   Needle Size:  25 G Needle Length:  1.5 inches Approach:  Anteromedial Ultrasound Guidance: No   Fluoroscopic Guidance: No   Arthrogram: No   Medications:  3 mL bupivacaine 0.5 %; 5 mL lidocaine 1 %; 80 mg methylPREDNISolone acetate 40 MG/ML Aspiration Attempted: No   Patient tolerance:  Patient tolerated the procedure well with no immediate complications  Large Joint Inj Date/Time: 08/11/2016 4:11 PM Performed by: Andrea Gilmore Authorized by: Andrea Gilmore   Consent Given by:  Patient Timeout: prior to procedure the correct patient, procedure, and site was verified   Indications:  Pain Location:  Shoulder Site:  R glenohumeral Prep: patient was prepped and draped in usual sterile fashion     Needle Size:  25 G Needle Length:  1.5 inches Approach:  Posterior Ultrasound Guidance: No   Fluoroscopic Guidance: No   Arthrogram: No   Medications:  2 mL lidocaine 1 %; 2 mL bupivacaine 0.5 %; 80 mg methylPREDNISolone acetate 40 MG/ML Aspiration Attempted: No   Patient tolerance:  Patient tolerated the procedure well with no immediate complications     Clinical Data: No additional findings.   Subjective: Chief Complaint  Patient presents with  . Right Knee - Pain    Andrea Gilmore is an 83 y o that has R shoulder pain and R knee pain.  . Right Shoulder - Pain  Andrea Gilmore has been seen on multiple occasions in the past. She has evidence of end-stage osteoarthritis related to rotator cuff arthropathy of both shoulders. She has seen Andrea Gilmore in the past. On occasion I have injected her right shoulder as it is been the more symptomatic. She is really having a difficult time maintaining her independence because of the weakness in her joint complaints. I also recently completed a series of flexion injections in her right knee. She's had cortisone injection of both knees in the past with evidence of second with significant arthritis laterally. She's not had any numbness or tingling. Denies any fever or chills. She does use a cane as an ambulatory aid  HPI  Review of Systems  Respiratory: Negative.   Cardiovascular: Negative.   Musculoskeletal:  Positive for back pain, gait problem, joint swelling and myalgias.     Objective: Vital Signs: BP (!) 159/64   Pulse 71   Ht 5\' 2"  (1.575 m)   Wt 101 lb (45.8 kg)   BMI 18.47 kg/m   Physical Exam Awake alert and oriented 3. Ortho Exam right shoulder with significant crepitation. Difficulty raising her arm overhead because of pain and weakness. Intrinsic atrophy musculature both of her hands. We can grip and release. Significant atrophy about both shoulders particularly the right. Might have a small effusion of her right shoulder. Skin  intact. Right knee without effusion. Knee was not hot red or swollen. No instability. Specialty Comments:  No specialty comments available.  Imaging: No results found.   PMFS History: Patient Active Problem List   Diagnosis Date Noted  . Takotsubo syndrome 12/08/2014  . S/p reverse total shoulder arthroplasty   . Cardiomyopathy ([place])   . Iron deficiency anemia   . Aspiration pneumonia due to inhalation of milk ([place])   . Secondary cardiomyopathy ([place])   . Acute cystitis without hematuria   . Hypoxia   . Blood poisoning   . Aspiration pneumonia due to food (regurgitated) ([place])   . Chronic kidney disease, stage III (moderate)   . UTI (lower urinary tract infection)   . Anemia, iron deficiency   . Acute blood loss anemia   . Acute encephalopathy   . Protein-calorie malnutrition, severe ([place]) 08/01/2014  . Acute systolic heart failure ([place])   . Respiratory failure ([place])   . NSTEMI (non-ST elevated myocardial infarction) ([place])   . Protein-calorie malnutrition ([place])   . Other specified hypothyroidism   . Acute pulmonary edema (HCC)   . Pleural effusion, left   . CKD (chronic kidney disease), stage III 07/28/2014  . Metabolic encephalopathy 11/91/4782  . Acute delirium 07/28/2014  . Fever 07/28/2014  . Acute respiratory failure with hypoxia ([place]) 07/28/2014  . Metabolic acidosis 95/62/1308  . Sepsis ([place]) 07/28/2014  . Left rotator cuff tear arthropathy 07/27/2014  . History of iron deficiency anemia 04/14/2013  . Acute renal failure ([place]) 04/14/2013  . Primary open angle glaucoma 04/08/2012  . History of breast cancer 06/17/2011   Past Medical History:  Diagnosis Date  . Arthritis   . Breast cancer ([place])    breast cancer  . Cardiomyopathy ([place])    a. possibly ischemic >> NSTEMI after should surgery in 07/2014 >>Echo 07/29/14:  EF 65%;  EF 08/01/14:  EF 20-25%, ant-septal AK, Gr 2 DD, mod MR, mod LAE, mod TR, PASP 63 mmHg  . Chronic systolic CHF (congestive heart failure)  ([place])   . CKD (chronic kidney disease)   . History of echocardiogram    Echo 9/16:  Vigorous LVF, EF 65-70%, Gr 1 DD, mild MR, mild LAE, PASP 32 mmHg  . Hypothyroidism     Family History  Problem Relation Age of Onset  . Brain cancer Mother   . Cancer - Lung Father     Past Surgical History:  Procedure Laterality Date  . ABDOMINAL HYSTERECTOMY    . BACK SURGERY    . BILATERAL CARPAL TUNNEL RELEASE    . CHOLECYSTECTOMY    . EYE SURGERY Right   . REVERSE SHOULDER ARTHROPLASTY Left 07/27/2014   Procedure: REVERSE SHOULDER ARTHROPLASTY;  Surgeon: Tania Ade, MD;  Location: [facility];  Service: Orthopedics;  Laterality: Left;  Left reverse total shoulder arthroplasty  . SHOULDER SURGERY Bilateral   . TONSILLECTOMY     Social History  Occupational History  . Not on file.   Social History Main Topics  . Smoking status: Never Smoker  . Smokeless tobacco: Never Used  . Alcohol use No  . Drug use: No  . Sexual activity: Not Currently     Andrea Balding, MD   Note - This record has been created using [employer].  Chart creation errors have been sought, but may not always  have been located. Such creation errors do not reflect on  the standard of medical care.

## 2016-08-12 DIAGNOSIS — H401133 Primary open-angle glaucoma, bilateral, severe stage: Secondary | ICD-10-CM | POA: Diagnosis not present

## 2016-09-24 DIAGNOSIS — M1990 Unspecified osteoarthritis, unspecified site: Secondary | ICD-10-CM | POA: Diagnosis not present

## 2016-09-24 DIAGNOSIS — E46 Unspecified protein-calorie malnutrition: Secondary | ICD-10-CM | POA: Diagnosis not present

## 2016-09-24 DIAGNOSIS — F322 Major depressive disorder, single episode, severe without psychotic features: Secondary | ICD-10-CM | POA: Diagnosis not present

## 2016-09-24 DIAGNOSIS — M549 Dorsalgia, unspecified: Secondary | ICD-10-CM | POA: Diagnosis not present

## 2016-09-24 DIAGNOSIS — M810 Age-related osteoporosis without current pathological fracture: Secondary | ICD-10-CM | POA: Diagnosis not present

## 2016-09-24 DIAGNOSIS — D692 Other nonthrombocytopenic purpura: Secondary | ICD-10-CM | POA: Diagnosis not present

## 2016-09-24 DIAGNOSIS — E039 Hypothyroidism, unspecified: Secondary | ICD-10-CM | POA: Diagnosis not present

## 2016-09-24 DIAGNOSIS — M4800 Spinal stenosis, site unspecified: Secondary | ICD-10-CM | POA: Diagnosis not present

## 2016-09-24 DIAGNOSIS — I10 Essential (primary) hypertension: Secondary | ICD-10-CM | POA: Diagnosis not present

## 2016-09-24 DIAGNOSIS — Z23 Encounter for immunization: Secondary | ICD-10-CM | POA: Diagnosis not present

## 2016-10-03 ENCOUNTER — Encounter (HOSPITAL_COMMUNITY): Payer: Medicare Other

## 2016-10-06 ENCOUNTER — Ambulatory Visit (INDEPENDENT_AMBULATORY_CARE_PROVIDER_SITE_OTHER): Payer: Medicare Other | Admitting: Orthopaedic Surgery

## 2016-10-06 ENCOUNTER — Encounter (INDEPENDENT_AMBULATORY_CARE_PROVIDER_SITE_OTHER): Payer: Self-pay | Admitting: Orthopaedic Surgery

## 2016-10-06 VITALS — BP 127/51 | HR 70 | Resp 14 | Ht 60.0 in | Wt 95.0 lb

## 2016-10-06 DIAGNOSIS — M25561 Pain in right knee: Secondary | ICD-10-CM | POA: Diagnosis not present

## 2016-10-06 DIAGNOSIS — G8929 Other chronic pain: Secondary | ICD-10-CM | POA: Diagnosis not present

## 2016-10-06 MED ORDER — LIDOCAINE HCL 1 % IJ SOLN
5.0000 mL | INTRAMUSCULAR | Status: AC | PRN
  Administered 2016-10-06: 5 mL

## 2016-10-06 MED ORDER — BUPIVACAINE HCL 0.5 % IJ SOLN
3.0000 mL | INTRAMUSCULAR | Status: AC | PRN
  Administered 2016-10-06: 3 mL via INTRA_ARTICULAR

## 2016-10-06 MED ORDER — METHYLPREDNISOLONE ACETATE 40 MG/ML IJ SUSP
80.0000 mg | INTRAMUSCULAR | Status: AC | PRN
  Administered 2016-10-06: 80 mg

## 2016-10-06 NOTE — Progress Notes (Signed)
Office Visit Note   Patient: Andrea Gilmore           Date of Birth: 10-25-1925           MRN: [id number] Visit Date: 10/06/2016              Requested by: Mayra Neer, MD [address] [place], [place] [address] PCP: Mayra Neer, MD   Assessment & Plan: Visit Diagnoses:  1. Chronic pain of right knee   Osteoarthritis right knee Recent skin abrasions from fall without sequelae Plan: Cortisone injection right knee. Follow up as needed  Follow-Up Instructions: Return if symptoms worsen or fail to improve.   Orders:  No orders of the defined types were placed in this encounter.  No orders of the defined types were placed in this encounter.     Procedures: Large Joint Inj Date/Time: 10/06/2016 4:42 PM Performed by: Garald Balding Authorized by: Garald Balding   Consent Given by:  Patient Timeout: prior to procedure the correct patient, procedure, and site was verified   Indications:  Pain and joint swelling Location:  Knee Site:  R knee Prep: patient was prepped and draped in usual sterile fashion   Needle Size:  25 G Needle Length:  1.5 inches Approach:  Anteromedial Ultrasound Guidance: No   Fluoroscopic Guidance: No   Arthrogram: No   Medications:  5 mL lidocaine 1 %; 80 mg methylPREDNISolone acetate 40 MG/ML; 3 mL bupivacaine 0.5 % Aspiration Attempted: No   Patient tolerance:  Patient tolerated the procedure well with no immediate complications     Clinical Data: No additional findings.   Subjective: Chief Complaint  Patient presents with  . Left Knee - Injury, Pain    [name] is an 83 y o that fell yesterday at church. She tripped over someone's foot , pt did not have walker with her.  Multiple joint complaints as previously outlined involving both shoulders and both knees. Has had an exacerbation of her right knee pain after a fall. Also had several skin abrasions about her left knee and left forearm after the fall  which are very minor and not a problem.  HPI  Review of Systems  Constitutional: Positive for fatigue. Negative for chills and fever.  Eyes: Negative for itching.  Respiratory: Negative for chest tightness and shortness of breath.   Cardiovascular: Negative for chest pain, palpitations and leg swelling.  Gastrointestinal: Negative for blood in stool, constipation and diarrhea.  Endocrine: Negative for polyuria.  Genitourinary: Negative for dysuria.  Musculoskeletal: Positive for back pain. Negative for joint swelling, neck pain and neck stiffness.  Allergic/Immunologic: Negative for immunocompromised state.  Neurological: Negative for dizziness and numbness.  Hematological: Bruises/bleeds easily.  Psychiatric/Behavioral: Positive for sleep disturbance. The patient is not nervous/anxious.      Objective: Vital Signs: BP (!) 127/51   Pulse 70   Resp 14   Ht 5' (1.524 m)   Wt 95 lb (43.1 kg)   BMI 18.55 kg/m   Physical Exam  Ortho Exam awake alert and oriented 3 comfortable sitting. Accompanied by her daughter. Small skin abrasions left forearm and over the anterior aspect of her left knee from a fall yesterday. Very superficial. No bleeding.. No evidence of infection. Has chronic loss of the extensor tendon to the left thumb which is a nuisance. Has rotator cuff arthropathy of her right shoulder with limited range of motion but presently comfortable. It does have increased varus of her right  knee was some hypertrophic changes consistent with her osteoarthritis previously diagnosed by plain film. No effusion. Skin intact around the right knee. Lacks a few degrees to full extension. Flexes over 105 without instability. Present pain seems to be related to the arthritis in her right knee  Specialty Comments:  No specialty comments available.  Imaging: No results found.   PMFS History: Patient Active Problem List   Diagnosis Date Noted  . Takotsubo syndrome 12/08/2014  . S/p  reverse total shoulder arthroplasty   . Cardiomyopathy ([place])   . Iron deficiency anemia   . Aspiration pneumonia due to inhalation of milk ([place])   . Secondary cardiomyopathy ([place])   . Acute cystitis without hematuria   . Hypoxia   . Blood poisoning   . Aspiration pneumonia due to food (regurgitated) ([place])   . Chronic kidney disease, stage III (moderate) (HCC)   . UTI (lower urinary tract infection)   . Anemia, iron deficiency   . Acute blood loss anemia   . Acute encephalopathy   . Protein-calorie malnutrition, severe ([place]) 08/01/2014  . Acute systolic heart failure ([place])   . Respiratory failure ([place])   . NSTEMI (non-ST elevated myocardial infarction) ([place])   . Protein-calorie malnutrition ([place])   . Other specified hypothyroidism   . Acute pulmonary edema (HCC)   . Pleural effusion, left   . CKD (chronic kidney disease), stage III ([place]) 07/28/2014  . Metabolic encephalopathy 29/79/8921  . Acute delirium 07/28/2014  . Fever 07/28/2014  . Acute respiratory failure with hypoxia ([place]) 07/28/2014  . Metabolic acidosis 19/41/7408  . Sepsis ([place]) 07/28/2014  . Left rotator cuff tear arthropathy 07/27/2014  . History of iron deficiency anemia 04/14/2013  . Acute renal failure ([place]) 04/14/2013  . Primary open angle glaucoma 04/08/2012  . History of breast cancer 06/17/2011   Past Medical History:  Diagnosis Date  . Arthritis   . Breast cancer ([place])    breast cancer  . Cardiomyopathy ([place])    a. possibly ischemic >> NSTEMI after should surgery in 07/2014 >>Echo 07/29/14:  EF 65%;  EF 08/01/14:  EF 20-25%, ant-septal AK, Gr 2 DD, mod MR, mod LAE, mod TR, PASP 63 mmHg  . Chronic systolic CHF (congestive heart failure) ([place])   . CKD (chronic kidney disease)   . History of echocardiogram    Echo 9/16:  Vigorous LVF, EF 65-70%, Gr 1 DD, mild MR, mild LAE, PASP 32 mmHg  . Hypothyroidism     Family History  Problem Relation Age of Onset  . Brain cancer Mother   . Cancer - Lung Father       Past Surgical History:  Procedure Laterality Date  . ABDOMINAL HYSTERECTOMY    . BACK SURGERY    . BILATERAL CARPAL TUNNEL RELEASE    . CHOLECYSTECTOMY    . EYE SURGERY Right   . REVERSE SHOULDER ARTHROPLASTY Left 07/27/2014   Procedure: REVERSE SHOULDER ARTHROPLASTY;  Surgeon: Tania Ade, MD;  Location: [facility];  Service: Orthopedics;  Laterality: Left;  Left reverse total shoulder arthroplasty  . SHOULDER SURGERY Bilateral   . TONSILLECTOMY     Social History   Occupational History  . Not on file.   Social History Main Topics  . Smoking status: Never Smoker  . Smokeless tobacco: Never Used  . Alcohol use No  . Drug use: No  . Sexual activity: Not Currently

## 2016-10-08 ENCOUNTER — Other Ambulatory Visit (HOSPITAL_COMMUNITY): Payer: Self-pay | Admitting: *Deleted

## 2016-10-09 ENCOUNTER — Ambulatory Visit (HOSPITAL_COMMUNITY)
Admission: RE | Admit: 2016-10-09 | Discharge: 2016-10-09 | Disposition: A | Discharge status: Home / Self Care | Payer: Medicare Other | Source: Ambulatory Visit | Attending: Family Medicine | Admitting: Family Medicine

## 2016-10-09 ENCOUNTER — Encounter (HOSPITAL_COMMUNITY): Payer: Self-pay

## 2016-10-09 DIAGNOSIS — D649 Anemia, unspecified: Secondary | ICD-10-CM | POA: Insufficient documentation

## 2016-10-09 MED ORDER — SODIUM CHLORIDE 0.9 % IV SOLN
510.0000 mg | INTRAVENOUS | Status: DC
  Administered 2016-10-09: 510 mg via INTRAVENOUS
  Filled 2016-10-09: qty 17

## 2016-10-09 NOTE — Discharge Instructions (Signed)

## 2016-10-16 ENCOUNTER — Ambulatory Visit (HOSPITAL_COMMUNITY)
Admission: RE | Admit: 2016-10-16 | Discharge: 2016-10-16 | Disposition: A | Discharge status: Home / Self Care | Payer: Medicare Other | Source: Ambulatory Visit | Attending: Family Medicine | Admitting: Family Medicine

## 2016-10-16 DIAGNOSIS — D649 Anemia, unspecified: Secondary | ICD-10-CM | POA: Insufficient documentation

## 2016-10-16 MED ORDER — SODIUM CHLORIDE 0.9 % IV SOLN
510.0000 mg | INTRAVENOUS | Status: DC
  Administered 2016-10-16: 10:00:00 510 mg via INTRAVENOUS
  Filled 2016-10-16: qty 17

## 2016-10-27 DIAGNOSIS — D649 Anemia, unspecified: Secondary | ICD-10-CM | POA: Diagnosis not present

## 2016-10-28 ENCOUNTER — Telehealth: Payer: Self-pay

## 2016-10-28 NOTE — Telephone Encounter (Signed)
Perfectly OK

## 2016-10-28 NOTE — Telephone Encounter (Signed)
Please advise 

## 2016-10-28 NOTE — Telephone Encounter (Signed)
Patient would like to know if Dr. Durward Fortes would okay her to see a rheumatologist that is located on [address].  She stated that it may be Dr. Amil Amen, but she is not sure.  She would like approval from Dr. Durward Fortes.  CB# [phone].  Please advise.

## 2016-10-30 NOTE — Telephone Encounter (Signed)
[facility] Dr. Amil Amen..had to register for an account in order to refer patients.

## 2016-11-06 NOTE — Telephone Encounter (Signed)
Could you please look and see if Dr. Keturah Barre would accept this patient of Dr. Durward Fortes?

## 2016-11-12 DIAGNOSIS — M1711 Unilateral primary osteoarthritis, right knee: Secondary | ICD-10-CM | POA: Diagnosis not present

## 2016-11-12 DIAGNOSIS — M1712 Unilateral primary osteoarthritis, left knee: Secondary | ICD-10-CM | POA: Diagnosis not present

## 2016-11-12 NOTE — Telephone Encounter (Signed)
Can you please put a referral in for Dr. Amil Amen? If any questions, see me or Ivin Booty.

## 2016-11-14 ENCOUNTER — Other Ambulatory Visit (INDEPENDENT_AMBULATORY_CARE_PROVIDER_SITE_OTHER): Payer: Self-pay

## 2016-11-14 DIAGNOSIS — M5440 Lumbago with sciatica, unspecified side: Secondary | ICD-10-CM

## 2016-11-14 NOTE — Telephone Encounter (Signed)
done

## 2016-11-24 DIAGNOSIS — E039 Hypothyroidism, unspecified: Secondary | ICD-10-CM | POA: Diagnosis not present

## 2016-12-04 DIAGNOSIS — M4726 Other spondylosis with radiculopathy, lumbar region: Secondary | ICD-10-CM | POA: Diagnosis not present

## 2016-12-04 DIAGNOSIS — M5136 Other intervertebral disc degeneration, lumbar region: Secondary | ICD-10-CM | POA: Diagnosis not present

## 2016-12-04 DIAGNOSIS — M48061 Spinal stenosis, lumbar region without neurogenic claudication: Secondary | ICD-10-CM | POA: Diagnosis not present

## 2017-01-12 ENCOUNTER — Encounter (INDEPENDENT_AMBULATORY_CARE_PROVIDER_SITE_OTHER): Payer: Self-pay | Admitting: Orthopaedic Surgery

## 2017-01-12 ENCOUNTER — Ambulatory Visit (INDEPENDENT_AMBULATORY_CARE_PROVIDER_SITE_OTHER): Payer: Medicare Other | Admitting: Orthopaedic Surgery

## 2017-01-12 VITALS — BP 125/54 | HR 72 | Resp 12 | Ht 60.0 in | Wt 98.0 lb

## 2017-01-12 DIAGNOSIS — M1711 Unilateral primary osteoarthritis, right knee: Secondary | ICD-10-CM | POA: Diagnosis not present

## 2017-01-12 MED ORDER — BUPIVACAINE HCL 0.5 % IJ SOLN
2.0000 mL | INTRAMUSCULAR | Status: AC | PRN
  Administered 2017-01-12: 2 mL via INTRA_ARTICULAR

## 2017-01-12 MED ORDER — METHYLPREDNISOLONE ACETATE 40 MG/ML IJ SUSP
80.0000 mg | INTRAMUSCULAR | Status: AC | PRN
  Administered 2017-01-12: 80 mg

## 2017-01-12 MED ORDER — LIDOCAINE HCL 1 % IJ SOLN
2.0000 mL | INTRAMUSCULAR | Status: AC | PRN
  Administered 2017-01-12: 2 mL

## 2017-01-12 NOTE — Progress Notes (Signed)
Office Visit Note   Patient: Andrea Gilmore           Date of Birth: 05/11/1925           MRN: [id number] Visit Date: 01/12/2017              Requested by: Mayra Neer, MD [address] [place], [place] [address] PCP: Mayra Neer, MD   Assessment & Plan: Visit Diagnoses:  1. Unilateral primary osteoarthritis, right knee     Plan: Cortisone injection right knee. Office as needed  Follow-Up Instructions: Return if symptoms worsen or fail to improve.   Orders:  No orders of the defined types were placed in this encounter.  No orders of the defined types were placed in this encounter.     Procedures: Large Joint Inj: R knee on 01/12/2017 10:48 AM Indications: pain and diagnostic evaluation Details: 25 G 1.5 in needle, anteromedial approach  Arthrogram: No  Medications: 2 mL lidocaine 1 %; 2 mL bupivacaine 0.5 %; 80 mg methylPREDNISolone acetate 40 MG/ML Procedure, treatment alternatives, risks and benefits explained, specific risks discussed. Consent was given by the patient. Immediately prior to procedure a time out was called to verify the correct patient, procedure, equipment, support staff and site/side marked as required. Patient was prepped and draped in the usual sterile fashion.       Clinical Data: No additional findings.   Subjective: Chief Complaint  Patient presents with  . Right Knee - Pain    Ms. Malmberg is an 84 y o here today for chronic right knee pain. Last injection 10/06/16. She relates she fell 2 days ago (using walker) and lost her balance, landed on right knee.  Having recurrent symptoms of knee osteoarthritis. Has had prior cortisone injections with excellent result  HPI  Review of Systems  Constitutional: Negative for chills, fatigue and fever.  Eyes: Negative for itching.  Respiratory: Positive for shortness of breath. Negative for chest tightness.   Cardiovascular: Negative for chest pain, palpitations and leg  swelling.  Gastrointestinal: Negative for blood in stool, constipation and diarrhea.  Endocrine: Negative for polyuria.  Genitourinary: Negative for dysuria.  Musculoskeletal: Negative for back pain, joint swelling, neck pain and neck stiffness.  Allergic/Immunologic: Negative for immunocompromised state.  Neurological: Negative for dizziness and numbness.  Hematological: Does not bruise/bleed easily.  Psychiatric/Behavioral: The patient is not nervous/anxious.      Objective: Vital Signs: BP (!) 125/54   Pulse 72   Resp 12   Ht 5' (1.524 m)   Wt 98 lb (44.5 kg)   BMI 19.14 kg/m   Physical Exam  Ortho Exam awake alert and oriented 3. Accompanied by daughter. Uses a walker for ambulation. Also wears is a ankle-foot orthotic on the right. No knee effusion but localized medial joint pain right knee. Small abrasion from a recent fall. No instability. Some palpable osteophytes in both medial lateral compartment was some mild patellar crepitation. Lacks a few degrees to full extension.  Specialty Comments:  No specialty comments available.  Imaging: No results found.   PMFS History: Patient Active Problem List   Diagnosis Date Noted  . Unilateral primary osteoarthritis, right knee 01/12/2017  . Takotsubo syndrome 12/08/2014  . S/p reverse total shoulder arthroplasty   . Cardiomyopathy ([place])   . Iron deficiency anemia   . Aspiration pneumonia due to inhalation of milk ([place])   . Secondary cardiomyopathy ([place])   . Acute cystitis without hematuria   . Hypoxia   .  Blood poisoning   . Aspiration pneumonia due to food (regurgitated) ([place])   . Chronic kidney disease, stage III (moderate) (HCC)   . UTI (lower urinary tract infection)   . Anemia, iron deficiency   . Acute blood loss anemia   . Acute encephalopathy   . Protein-calorie malnutrition, severe ([place]) 08/01/2014  . Acute systolic heart failure ([place])   . Respiratory failure ([place])   . NSTEMI (non-ST elevated myocardial  infarction) ([place])   . Protein-calorie malnutrition ([place])   . Other specified hypothyroidism   . Acute pulmonary edema (HCC)   . Pleural effusion, left   . CKD (chronic kidney disease), stage III ([place]) 07/28/2014  . Metabolic encephalopathy 11/02/2534  . Acute delirium 07/28/2014  . Fever 07/28/2014  . Acute respiratory failure with hypoxia ([place]) 07/28/2014  . Metabolic acidosis 64/40/3474  . Sepsis ([place]) 07/28/2014  . Left rotator cuff tear arthropathy 07/27/2014  . History of iron deficiency anemia 04/14/2013  . Acute renal failure ([place]) 04/14/2013  . Primary open angle glaucoma 04/08/2012  . History of breast cancer 06/17/2011   Past Medical History:  Diagnosis Date  . Arthritis   . Breast cancer ([place])    breast cancer  . Cardiomyopathy ([place])    a. possibly ischemic >> NSTEMI after should surgery in 07/2014 >>Echo 07/29/14:  EF 65%;  EF 08/01/14:  EF 20-25%, ant-septal AK, Gr 2 DD, mod MR, mod LAE, mod TR, PASP 63 mmHg  . Chronic systolic CHF (congestive heart failure) ([place])   . CKD (chronic kidney disease)   . History of echocardiogram    Echo 9/16:  Vigorous LVF, EF 65-70%, Gr 1 DD, mild MR, mild LAE, PASP 32 mmHg  . Hypothyroidism     Family History  Problem Relation Age of Onset  . Brain cancer Mother   . Cancer - Lung Father     Past Surgical History:  Procedure Laterality Date  . ABDOMINAL HYSTERECTOMY    . BACK SURGERY    . BILATERAL CARPAL TUNNEL RELEASE    . CHOLECYSTECTOMY    . EYE SURGERY Right   . REVERSE SHOULDER ARTHROPLASTY Left 07/27/2014   Procedure: REVERSE SHOULDER ARTHROPLASTY;  Surgeon: Tania Ade, MD;  Location: [facility];  Service: Orthopedics;  Laterality: Left;  Left reverse total shoulder arthroplasty  . SHOULDER SURGERY Bilateral   . TONSILLECTOMY     Social History   Occupational History  . Not on file  Tobacco Use  . Smoking status: Never Smoker  . Smokeless tobacco: Never Used  Substance and Sexual Activity  . Alcohol use: No  .  Drug use: No  . Sexual activity: Not Currently

## 2017-01-21 DIAGNOSIS — M170 Bilateral primary osteoarthritis of knee: Secondary | ICD-10-CM | POA: Diagnosis not present

## 2017-01-21 DIAGNOSIS — Z6821 Body mass index (BMI) 21.0-21.9, adult: Secondary | ICD-10-CM | POA: Diagnosis not present

## 2017-01-21 DIAGNOSIS — M25561 Pain in right knee: Secondary | ICD-10-CM | POA: Diagnosis not present

## 2017-01-21 DIAGNOSIS — M25562 Pain in left knee: Secondary | ICD-10-CM | POA: Diagnosis not present

## 2017-01-21 DIAGNOSIS — G8929 Other chronic pain: Secondary | ICD-10-CM | POA: Diagnosis not present

## 2017-02-04 DIAGNOSIS — M47816 Spondylosis without myelopathy or radiculopathy, lumbar region: Secondary | ICD-10-CM | POA: Diagnosis not present

## 2017-02-04 DIAGNOSIS — M48062 Spinal stenosis, lumbar region with neurogenic claudication: Secondary | ICD-10-CM | POA: Diagnosis not present

## 2017-02-04 DIAGNOSIS — M5136 Other intervertebral disc degeneration, lumbar region: Secondary | ICD-10-CM | POA: Diagnosis not present

## 2017-02-18 DIAGNOSIS — H401133 Primary open-angle glaucoma, bilateral, severe stage: Secondary | ICD-10-CM | POA: Diagnosis not present

## 2017-03-30 ENCOUNTER — Ambulatory Visit (INDEPENDENT_AMBULATORY_CARE_PROVIDER_SITE_OTHER): Payer: Medicare Other | Admitting: Orthopaedic Surgery

## 2017-03-30 ENCOUNTER — Encounter (INDEPENDENT_AMBULATORY_CARE_PROVIDER_SITE_OTHER): Payer: Self-pay | Admitting: Orthopaedic Surgery

## 2017-03-30 VITALS — BP 139/77 | HR 85 | Resp 16 | Ht 59.0 in | Wt 107.0 lb

## 2017-03-30 DIAGNOSIS — M25511 Pain in right shoulder: Secondary | ICD-10-CM | POA: Diagnosis not present

## 2017-03-30 DIAGNOSIS — G8929 Other chronic pain: Secondary | ICD-10-CM

## 2017-03-30 MED ORDER — METHYLPREDNISOLONE ACETATE 40 MG/ML IJ SUSP
80.0000 mg | INTRAMUSCULAR | Status: AC | PRN
  Administered 2017-03-30: 80 mg

## 2017-03-30 MED ORDER — BUPIVACAINE HCL 0.5 % IJ SOLN
2.0000 mL | INTRAMUSCULAR | Status: AC | PRN
  Administered 2017-03-30: 2 mL via INTRA_ARTICULAR

## 2017-03-30 MED ORDER — LIDOCAINE HCL 1 % IJ SOLN
2.0000 mL | INTRAMUSCULAR | Status: AC | PRN
  Administered 2017-03-30: 2 mL

## 2017-03-30 NOTE — Progress Notes (Signed)
Office Visit Note   Patient: Andrea Gilmore           Date of Birth: 03-01-1925           MRN: [id number] Visit Date: 03/30/2017              Requested by: Mayra Neer, MD [address] [place], [place] [address] PCP: Mayra Neer, MD   Assessment & Plan: Visit Diagnoses:  1. Chronic right shoulder pain     Plan: Rotator cuff arthropathy right shoulder-chronic.  Will inject with cortisone and have her return as needed Follow-Up Instructions: Return if symptoms worsen or fail to improve.   Orders:  No orders of the defined types were placed in this encounter.  No orders of the defined types were placed in this encounter.     Procedures: Large Joint Inj: R glenohumeral on 03/30/2017 3:05 PM Indications: pain and diagnostic evaluation Details: 25 G 1.5 in needle, posterior approach  Arthrogram: No  Medications: 2 mL lidocaine 1 %; 2 mL bupivacaine 0.5 %; 80 mg methylPREDNISolone acetate 40 MG/ML Aspirate: 13 mL serous Outcome: tolerated well, no immediate complications Consent was given by the patient. Immediately prior to procedure a time out was called to verify the correct patient, procedure, equipment, support staff and site/side marked as required. Patient was prepped and draped in the usual sterile fashion.       Clinical Data: No additional findings.   Subjective: No chief complaint on file. Mrs. Szumski will turn 84 years old of the very near future.  I have been following her for the bilateral knee and shoulder pain.  She has evidence of rotator cuff arthropathy of the right shoulder and has been receiving interval cortisone injections.  She has an issue with her back and with her balance and uses a walker.  She is having considerable difficulty with overhead range of motion of her right shoulder.  She has had a prior reverse shoulder arthroplasty on the left  HPI  Review of Systems   Objective: Vital Signs: There were no vitals  taken for this visit.  Physical Exam  Ortho Exam awake alert and oriented x3.  Comfortable sitting.  Very limited range of motion of right shoulder both actively and passively.  Good grip and good release right hand.  Intrinsic atrophy of right hand.  Skin intact.  Crepitation with range of motion.  Considerable weakness with internal/external rotation consistent with a rotator cuff arthropathy.  Specialty Comments:  No specialty comments available.  Imaging: No results found.   PMFS History: Patient Active Problem List   Diagnosis Date Noted  . Unilateral primary osteoarthritis, right knee 01/12/2017  . Takotsubo syndrome 12/08/2014  . S/p reverse total shoulder arthroplasty   . Cardiomyopathy ([place])   . Iron deficiency anemia   . Aspiration pneumonia due to inhalation of milk ([place])   . Secondary cardiomyopathy ([place])   . Acute cystitis without hematuria   . Hypoxia   . Blood poisoning   . Aspiration pneumonia due to food (regurgitated) ([place])   . Chronic kidney disease, stage III (moderate) (HCC)   . UTI (lower urinary tract infection)   . Anemia, iron deficiency   . Acute blood loss anemia   . Acute encephalopathy   . Protein-calorie malnutrition, severe ([place]) 08/01/2014  . Acute systolic heart failure ([place])   . Respiratory failure ([place])   . NSTEMI (non-ST elevated myocardial infarction) ([place])   . Protein-calorie malnutrition ([place])   .  Other specified hypothyroidism   . Acute pulmonary edema (HCC)   . Pleural effusion, left   . CKD (chronic kidney disease), stage III ([place]) 07/28/2014  . Metabolic encephalopathy 34/35/6861  . Acute delirium 07/28/2014  . Fever 07/28/2014  . Acute respiratory failure with hypoxia ([place]) 07/28/2014  . Metabolic acidosis 68/37/2902  . Sepsis ([place]) 07/28/2014  . Left rotator cuff tear arthropathy 07/27/2014  . History of iron deficiency anemia 04/14/2013  . Acute renal failure ([place]) 04/14/2013  . Primary open angle glaucoma 04/08/2012  .  History of breast cancer 06/17/2011   Past Medical History:  Diagnosis Date  . Arthritis   . Breast cancer ([place])    breast cancer  . Cardiomyopathy ([place])    a. possibly ischemic >> NSTEMI after should surgery in 07/2014 >>Echo 07/29/14:  EF 65%;  EF 08/01/14:  EF 20-25%, ant-septal AK, Gr 2 DD, mod MR, mod LAE, mod TR, PASP 63 mmHg  . Chronic systolic CHF (congestive heart failure) ([place])   . CKD (chronic kidney disease)   . History of echocardiogram    Echo 9/16:  Vigorous LVF, EF 65-70%, Gr 1 DD, mild MR, mild LAE, PASP 32 mmHg  . Hypothyroidism     Family History  Problem Relation Age of Onset  . Brain cancer Mother   . Cancer - Lung Father     Past Surgical History:  Procedure Laterality Date  . ABDOMINAL HYSTERECTOMY    . BACK SURGERY    . BILATERAL CARPAL TUNNEL RELEASE    . CHOLECYSTECTOMY    . EYE SURGERY Right   . REVERSE SHOULDER ARTHROPLASTY Left 07/27/2014   Procedure: REVERSE SHOULDER ARTHROPLASTY;  Surgeon: Tania Ade, MD;  Location: [facility];  Service: Orthopedics;  Laterality: Left;  Left reverse total shoulder arthroplasty  . SHOULDER SURGERY Bilateral   . TONSILLECTOMY     Social History   Occupational History  . Not on file  Tobacco Use  . Smoking status: Never Smoker  . Smokeless tobacco: Never Used  Substance and Sexual Activity  . Alcohol use: No  . Drug use: No  . Sexual activity: Not Currently     Garald Balding, MD   Note - This record has been created using [employer].  Chart creation errors have been sought, but may not always  have been located. Such creation errors do not reflect on  the standard of medical care.

## 2017-03-30 NOTE — Progress Notes (Deleted)
Office Visit Note   Patient: Andrea Gilmore           Date of Birth: [birth year]           MRN: [id number] Visit Date: 03/30/2017              Requested by: Mayra Neer, MD [address] [place], [place] [address] PCP: Mayra Neer, MD   Assessment & Plan: Visit Diagnoses:  1. Chronic right shoulder pain     Plan: ***  Follow-Up Instructions: Return if symptoms worsen or fail to improve.   Orders:  Orders Placed This Encounter  Procedures  . Large Joint Inj: R glenohumeral   No orders of the defined types were placed in this encounter.     Procedures: No procedures performed   Clinical Data: No additional findings.   Subjective: No chief complaint on file.   HPI  Review of Systems  Constitutional: Negative for fatigue and fever.  HENT: Positive for ear pain.   Eyes: Negative for pain.  Respiratory: Negative for cough and shortness of breath.   Cardiovascular: Negative for leg swelling.  Gastrointestinal: Negative for blood in stool, constipation and diarrhea.  Genitourinary: Negative for difficulty urinating.  Musculoskeletal: Positive for back pain. Negative for neck pain.  Skin: Negative for rash and wound.  Allergic/Immunologic: Negative for food allergies.  Neurological: Negative for dizziness, weakness, light-headedness, numbness and headaches.  Hematological: Bruises/bleeds easily.  Psychiatric/Behavioral: Negative for sleep disturbance.     Objective: Vital Signs: There were no vitals taken for this visit.  Physical Exam  Ortho Exam  Specialty Comments:  No specialty comments available.  Imaging: No results found.   PMFS History: Patient Active Problem List   Diagnosis Date Noted  . Unilateral primary osteoarthritis, right knee 01/12/2017  . Takotsubo syndrome 12/08/2014  . S/p reverse total shoulder arthroplasty   . Cardiomyopathy ([place])   . Iron deficiency anemia   . Aspiration pneumonia due to  inhalation of milk ([place])   . Secondary cardiomyopathy ([place])   . Acute cystitis without hematuria   . Hypoxia   . Blood poisoning   . Aspiration pneumonia due to food (regurgitated) ([place])   . Chronic kidney disease, stage III (moderate) (HCC)   . UTI (lower urinary tract infection)   . Anemia, iron deficiency   . Acute blood loss anemia   . Acute encephalopathy   . Protein-calorie malnutrition, severe ([place]) 08/01/2014  . Acute systolic heart failure ([place])   . Respiratory failure ([place])   . NSTEMI (non-ST elevated myocardial infarction) ([place])   . Protein-calorie malnutrition ([place])   . Other specified hypothyroidism   . Acute pulmonary edema (HCC)   . Pleural effusion, left   . CKD (chronic kidney disease), stage III ([place]) 07/28/2014  . Metabolic encephalopathy 57/32/2025  . Acute delirium 07/28/2014  . Fever 07/28/2014  . Acute respiratory failure with hypoxia ([place]) 07/28/2014  . Metabolic acidosis 42/70/6237  . Sepsis ([place]) 07/28/2014  . Left rotator cuff tear arthropathy 07/27/2014  . History of iron deficiency anemia 04/14/2013  . Acute renal failure ([place]) 04/14/2013  . Primary open angle glaucoma 04/08/2012  . History of breast cancer 06/17/2011   Past Medical History:  Diagnosis Date  . Arthritis   . Breast cancer ([place])    breast cancer  . Cardiomyopathy ([place])    a. possibly ischemic >> NSTEMI after should surgery in 07/2014 >>Echo 07/29/14:  EF 65%;  EF 08/01/14:  EF 20-25%, ant-septal AK,  Gr 2 DD, mod MR, mod LAE, mod TR, PASP 63 mmHg  . Chronic systolic CHF (congestive heart failure) ([place])   . CKD (chronic kidney disease)   . History of echocardiogram    Echo 9/16:  Vigorous LVF, EF 65-70%, Gr 1 DD, mild MR, mild LAE, PASP 32 mmHg  . Hypothyroidism     Family History  Problem Relation Age of Onset  . Brain cancer Mother   . Cancer - Lung Father     Past Surgical History:  Procedure Laterality Date  . ABDOMINAL HYSTERECTOMY    . BACK SURGERY    . BILATERAL  CARPAL TUNNEL RELEASE    . CHOLECYSTECTOMY    . EYE SURGERY Right   . KNEE SURGERY    . REVERSE SHOULDER ARTHROPLASTY Left 07/27/2014   Procedure: REVERSE SHOULDER ARTHROPLASTY;  Surgeon: Tania Ade, MD;  Location: [facility];  Service: Orthopedics;  Laterality: Left;  Left reverse total shoulder arthroplasty  . SHOULDER SURGERY Bilateral   . TONSILLECTOMY     Social History   Occupational History  . Not on file  Tobacco Use  . Smoking status: Never Smoker  . Smokeless tobacco: Never Used  Substance and Sexual Activity  . Alcohol use: No  . Drug use: No  . Sexual activity: Not Currently

## 2017-04-01 DIAGNOSIS — F322 Major depressive disorder, single episode, severe without psychotic features: Secondary | ICD-10-CM | POA: Diagnosis not present

## 2017-04-01 DIAGNOSIS — M21371 Foot drop, right foot: Secondary | ICD-10-CM | POA: Diagnosis not present

## 2017-04-01 DIAGNOSIS — K219 Gastro-esophageal reflux disease without esophagitis: Secondary | ICD-10-CM | POA: Diagnosis not present

## 2017-04-01 DIAGNOSIS — I10 Essential (primary) hypertension: Secondary | ICD-10-CM | POA: Diagnosis not present

## 2017-04-01 DIAGNOSIS — M1990 Unspecified osteoarthritis, unspecified site: Secondary | ICD-10-CM | POA: Diagnosis not present

## 2017-04-01 DIAGNOSIS — D692 Other nonthrombocytopenic purpura: Secondary | ICD-10-CM | POA: Diagnosis not present

## 2017-04-01 DIAGNOSIS — Z0000 Encounter for general adult medical examination without abnormal findings: Secondary | ICD-10-CM | POA: Diagnosis not present

## 2017-04-01 DIAGNOSIS — E039 Hypothyroidism, unspecified: Secondary | ICD-10-CM | POA: Diagnosis not present

## 2017-04-01 DIAGNOSIS — M4800 Spinal stenosis, site unspecified: Secondary | ICD-10-CM | POA: Diagnosis not present

## 2017-04-01 DIAGNOSIS — M810 Age-related osteoporosis without current pathological fracture: Secondary | ICD-10-CM | POA: Diagnosis not present

## 2017-04-01 DIAGNOSIS — R32 Unspecified urinary incontinence: Secondary | ICD-10-CM | POA: Diagnosis not present

## 2017-04-01 DIAGNOSIS — M21372 Foot drop, left foot: Secondary | ICD-10-CM | POA: Diagnosis not present

## 2017-04-09 ENCOUNTER — Ambulatory Visit: Payer: Medicare Other | Admitting: Physical Therapy

## 2017-04-14 ENCOUNTER — Ambulatory Visit: Payer: Medicare Other | Admitting: Physical Therapy

## 2017-04-16 ENCOUNTER — Encounter (INDEPENDENT_AMBULATORY_CARE_PROVIDER_SITE_OTHER): Payer: Self-pay | Admitting: Orthopaedic Surgery

## 2017-04-16 ENCOUNTER — Ambulatory Visit (INDEPENDENT_AMBULATORY_CARE_PROVIDER_SITE_OTHER): Payer: Medicare Other | Admitting: Orthopaedic Surgery

## 2017-04-16 VITALS — BP 144/67 | HR 87 | Resp 16 | Ht <= 58 in | Wt 107.0 lb

## 2017-04-16 DIAGNOSIS — M1711 Unilateral primary osteoarthritis, right knee: Secondary | ICD-10-CM | POA: Diagnosis not present

## 2017-04-16 MED ORDER — BUPIVACAINE HCL 0.5 % IJ SOLN
2.0000 mL | INTRAMUSCULAR | Status: AC | PRN
  Administered 2017-04-16: 2 mL via INTRA_ARTICULAR

## 2017-04-16 MED ORDER — LIDOCAINE HCL 1 % IJ SOLN
2.0000 mL | INTRAMUSCULAR | Status: AC | PRN
  Administered 2017-04-16: 2 mL

## 2017-04-16 MED ORDER — METHYLPREDNISOLONE ACETATE 40 MG/ML IJ SUSP
80.0000 mg | INTRAMUSCULAR | Status: AC | PRN
  Administered 2017-04-16: 80 mg

## 2017-04-16 NOTE — Progress Notes (Signed)
Office Visit Note   Patient: Andrea Gilmore           Date of Birth: 28-Mar-1925           MRN: [id number] Visit Date: 04/16/2017              Requested by: Andrea Neer, MD [address] [place], [place] [address] PCP: Andrea Neer, MD   Assessment & Plan: Visit Diagnoses:  1. Unilateral primary osteoarthritis, right knee     Plan: Primary osteoarthritis right knee.  Will reinject with cortisone.  Return as needed Long discussion with Andrea Gilmore and her daughter regarding treatment options which are limited given her age and comorbidities  Follow-Up Instructions: Return if symptoms worsen or fail to improve.   Orders:  No orders of the defined types were placed in this encounter.  No orders of the defined types were placed in this encounter.     Procedures: Large Joint Inj: R knee on 04/16/2017 11:48 AM Indications: pain and diagnostic evaluation Details: 25 G 1.5 in needle, anteromedial approach  Arthrogram: No  Medications: 2 mL lidocaine 1 %; 2 mL bupivacaine 0.5 %; 80 mg methylPREDNISolone acetate 40 MG/ML Procedure, treatment alternatives, risks and benefits explained, specific risks discussed. Consent was given by the patient. Immediately prior to procedure a time out was called to verify the correct patient, procedure, equipment, support staff and site/side marked as required. Patient was prepped and draped in the usual sterile fashion.       Clinical Data: No additional findings.   Subjective: Chief Complaint  Patient presents with  . Right Knee - Follow-up  . Follow-up    R KNEE INJECTION  Seen 3 months ago for the osteoarthritis of her right knee.  Has had multiple cortisone injections in the past.  Ambulation and activities are limited for a number of factors including balance and chronic back pain.  Uses a walker.  Wants another "cortisone injection in my right knee.  No new injuries or trauma  HPI  Review of  Systems   Objective: Vital Signs: BP (!) 144/67 (BP Location: Left Arm, Patient Position: Sitting, Cuff Size: Normal)   Pulse 87   Resp 16   Ht 4\' 10"  (1.473 m)   Wt 107 lb (48.5 kg)   BMI 22.36 kg/m   Physical Exam  Ortho Exam awake alert and oriented x  3.  Accompanied by daughter.  Uses a walker.  Full passive extension of right knee flexed over 100 degrees.  No instability.  Very thin.  Weak in all extremities.  No effusion right knee but mild medial lateral joint pain.  No distal edema.  Some venous stasis changes.  Straight leg raise negative  Specialty Comments:  No specialty comments available.  Imaging: No results found.   PMFS History: Patient Active Problem List   Diagnosis Date Noted  . Unilateral primary osteoarthritis, right knee 01/12/2017  . Takotsubo syndrome 12/08/2014  . S/p reverse total shoulder arthroplasty   . Cardiomyopathy ([place])   . Iron deficiency anemia   . Aspiration pneumonia due to inhalation of milk ([place])   . Secondary cardiomyopathy ([place])   . Acute cystitis without hematuria   . Hypoxia   . Blood poisoning   . Aspiration pneumonia due to food (regurgitated) ([place])   . Chronic kidney disease, stage III (moderate) (HCC)   . UTI (lower urinary tract infection)   . Anemia, iron deficiency   . Acute blood loss anemia   .  Acute encephalopathy   . Protein-calorie malnutrition, severe ([place]) 08/01/2014  . Acute systolic heart failure ([place])   . Respiratory failure ([place])   . NSTEMI (non-ST elevated myocardial infarction) ([place])   . Protein-calorie malnutrition ([place])   . Other specified hypothyroidism   . Acute pulmonary edema (HCC)   . Pleural effusion, left   . CKD (chronic kidney disease), stage III ([place]) 07/28/2014  . Metabolic encephalopathy 37/62/8315  . Acute delirium 07/28/2014  . Fever 07/28/2014  . Acute respiratory failure with hypoxia ([place]) 07/28/2014  . Metabolic acidosis 17/61/6073  . Sepsis ([place]) 07/28/2014  . Left rotator cuff  tear arthropathy 07/27/2014  . History of iron deficiency anemia 04/14/2013  . Acute renal failure ([place]) 04/14/2013  . Primary open angle glaucoma 04/08/2012  . History of breast cancer 06/17/2011   Past Medical History:  Diagnosis Date  . Arthritis   . Breast cancer ([place])    breast cancer  . Cardiomyopathy ([place])    a. possibly ischemic >> NSTEMI after should surgery in 07/2014 >>Echo 07/29/14:  EF 65%;  EF 08/01/14:  EF 20-25%, ant-septal AK, Gr 2 DD, mod MR, mod LAE, mod TR, PASP 63 mmHg  . Chronic systolic CHF (congestive heart failure) ([place])   . CKD (chronic kidney disease)   . History of echocardiogram    Echo 9/16:  Vigorous LVF, EF 65-70%, Gr 1 DD, mild MR, mild LAE, PASP 32 mmHg  . Hypothyroidism     Family History  Problem Relation Age of Onset  . Brain cancer Mother   . Cancer - Lung Father     Past Surgical History:  Procedure Laterality Date  . ABDOMINAL HYSTERECTOMY    . BACK SURGERY    . BILATERAL CARPAL TUNNEL RELEASE    . CHOLECYSTECTOMY    . EYE SURGERY Right   . KNEE SURGERY    . REVERSE SHOULDER ARTHROPLASTY Left 07/27/2014   Procedure: REVERSE SHOULDER ARTHROPLASTY;  Surgeon: Tania Ade, MD;  Location: [facility];  Service: Orthopedics;  Laterality: Left;  Left reverse total shoulder arthroplasty  . SHOULDER SURGERY Bilateral   . TONSILLECTOMY     Social History   Occupational History  . Not on file  Tobacco Use  . Smoking status: Never Smoker  . Smokeless tobacco: Never Used  Substance and Sexual Activity  . Alcohol use: No  . Drug use: No  . Sexual activity: Not Currently     Andrea Balding, MD   Note - This record has been created using [employer].  Chart creation errors have been sought, but may not always  have been located. Such creation errors do not reflect on  the standard of medical care.

## 2017-04-20 ENCOUNTER — Ambulatory Visit (INDEPENDENT_AMBULATORY_CARE_PROVIDER_SITE_OTHER): Payer: Medicare Other | Admitting: Orthopaedic Surgery

## 2017-04-21 DIAGNOSIS — R197 Diarrhea, unspecified: Secondary | ICD-10-CM | POA: Diagnosis not present

## 2017-04-21 DIAGNOSIS — K047 Periapical abscess without sinus: Secondary | ICD-10-CM | POA: Diagnosis not present

## 2017-04-21 DIAGNOSIS — I10 Essential (primary) hypertension: Secondary | ICD-10-CM | POA: Diagnosis not present

## 2017-04-21 DIAGNOSIS — F322 Major depressive disorder, single episode, severe without psychotic features: Secondary | ICD-10-CM | POA: Diagnosis not present

## 2017-05-07 DIAGNOSIS — M4726 Other spondylosis with radiculopathy, lumbar region: Secondary | ICD-10-CM | POA: Diagnosis not present

## 2017-05-07 DIAGNOSIS — M48061 Spinal stenosis, lumbar region without neurogenic claudication: Secondary | ICD-10-CM | POA: Diagnosis not present

## 2017-05-07 DIAGNOSIS — M5136 Other intervertebral disc degeneration, lumbar region: Secondary | ICD-10-CM | POA: Diagnosis not present

## 2017-05-20 ENCOUNTER — Ambulatory Visit: Payer: Medicare Other | Admitting: Physical Therapy

## 2017-06-08 ENCOUNTER — Telehealth (INDEPENDENT_AMBULATORY_CARE_PROVIDER_SITE_OTHER): Payer: Self-pay | Admitting: Orthopaedic Surgery

## 2017-06-08 NOTE — Telephone Encounter (Signed)
Returned call to patient no answer and no answering machine pickup   Patient want to give change of address  [phone]

## 2017-09-03 DIAGNOSIS — M48061 Spinal stenosis, lumbar region without neurogenic claudication: Secondary | ICD-10-CM | POA: Diagnosis not present

## 2017-09-03 DIAGNOSIS — M4726 Other spondylosis with radiculopathy, lumbar region: Secondary | ICD-10-CM | POA: Diagnosis not present

## 2017-09-03 DIAGNOSIS — M5136 Other intervertebral disc degeneration, lumbar region: Secondary | ICD-10-CM | POA: Diagnosis not present

## 2017-09-16 DIAGNOSIS — M48062 Spinal stenosis, lumbar region with neurogenic claudication: Secondary | ICD-10-CM | POA: Diagnosis not present

## 2017-09-16 DIAGNOSIS — Z681 Body mass index (BMI) 19 or less, adult: Secondary | ICD-10-CM | POA: Diagnosis not present

## 2017-09-16 DIAGNOSIS — M5136 Other intervertebral disc degeneration, lumbar region: Secondary | ICD-10-CM | POA: Diagnosis not present

## 2017-09-16 DIAGNOSIS — M546 Pain in thoracic spine: Secondary | ICD-10-CM | POA: Diagnosis not present

## 2017-09-16 DIAGNOSIS — M47816 Spondylosis without myelopathy or radiculopathy, lumbar region: Secondary | ICD-10-CM | POA: Diagnosis not present

## 2017-09-16 DIAGNOSIS — R030 Elevated blood-pressure reading, without diagnosis of hypertension: Secondary | ICD-10-CM | POA: Diagnosis not present

## 2017-09-16 DIAGNOSIS — M5416 Radiculopathy, lumbar region: Secondary | ICD-10-CM | POA: Diagnosis not present

## 2017-09-21 DIAGNOSIS — M4726 Other spondylosis with radiculopathy, lumbar region: Secondary | ICD-10-CM | POA: Diagnosis not present

## 2017-09-21 DIAGNOSIS — M48061 Spinal stenosis, lumbar region without neurogenic claudication: Secondary | ICD-10-CM | POA: Diagnosis not present

## 2017-09-21 DIAGNOSIS — M5136 Other intervertebral disc degeneration, lumbar region: Secondary | ICD-10-CM | POA: Diagnosis not present

## 2017-10-07 DIAGNOSIS — E039 Hypothyroidism, unspecified: Secondary | ICD-10-CM | POA: Diagnosis not present

## 2017-10-07 DIAGNOSIS — E46 Unspecified protein-calorie malnutrition: Secondary | ICD-10-CM | POA: Diagnosis not present

## 2017-10-07 DIAGNOSIS — I10 Essential (primary) hypertension: Secondary | ICD-10-CM | POA: Diagnosis not present

## 2017-10-07 DIAGNOSIS — D692 Other nonthrombocytopenic purpura: Secondary | ICD-10-CM | POA: Diagnosis not present

## 2017-10-07 DIAGNOSIS — M4800 Spinal stenosis, site unspecified: Secondary | ICD-10-CM | POA: Diagnosis not present

## 2017-10-07 DIAGNOSIS — D649 Anemia, unspecified: Secondary | ICD-10-CM | POA: Diagnosis not present

## 2017-10-07 DIAGNOSIS — F322 Major depressive disorder, single episode, severe without psychotic features: Secondary | ICD-10-CM | POA: Diagnosis not present

## 2017-10-07 DIAGNOSIS — Z23 Encounter for immunization: Secondary | ICD-10-CM | POA: Diagnosis not present

## 2017-10-07 DIAGNOSIS — M810 Age-related osteoporosis without current pathological fracture: Secondary | ICD-10-CM | POA: Diagnosis not present

## 2017-10-13 ENCOUNTER — Emergency Department (HOSPITAL_COMMUNITY)
Admission: EM | Admit: 2017-10-13 | Discharge: 2017-10-14 | Disposition: A | Discharge status: Home / Self Care | Payer: Medicare Other | Attending: Emergency Medicine | Admitting: Emergency Medicine

## 2017-10-13 ENCOUNTER — Encounter (HOSPITAL_COMMUNITY): Payer: Self-pay | Admitting: Emergency Medicine

## 2017-10-13 ENCOUNTER — Other Ambulatory Visit: Payer: Self-pay

## 2017-10-13 DIAGNOSIS — I5022 Chronic systolic (congestive) heart failure: Secondary | ICD-10-CM | POA: Diagnosis not present

## 2017-10-13 DIAGNOSIS — R0602 Shortness of breath: Secondary | ICD-10-CM | POA: Diagnosis not present

## 2017-10-13 DIAGNOSIS — R531 Weakness: Secondary | ICD-10-CM | POA: Diagnosis present

## 2017-10-13 DIAGNOSIS — R5381 Other malaise: Secondary | ICD-10-CM | POA: Insufficient documentation

## 2017-10-13 DIAGNOSIS — I252 Old myocardial infarction: Secondary | ICD-10-CM | POA: Insufficient documentation

## 2017-10-13 DIAGNOSIS — I130 Hypertensive heart and chronic kidney disease with heart failure and stage 1 through stage 4 chronic kidney disease, or unspecified chronic kidney disease: Secondary | ICD-10-CM | POA: Insufficient documentation

## 2017-10-13 DIAGNOSIS — Z96612 Presence of left artificial shoulder joint: Secondary | ICD-10-CM | POA: Diagnosis not present

## 2017-10-13 DIAGNOSIS — Z853 Personal history of malignant neoplasm of breast: Secondary | ICD-10-CM | POA: Diagnosis not present

## 2017-10-13 DIAGNOSIS — N390 Urinary tract infection, site not specified: Secondary | ICD-10-CM | POA: Diagnosis not present

## 2017-10-13 DIAGNOSIS — Z79899 Other long term (current) drug therapy: Secondary | ICD-10-CM | POA: Insufficient documentation

## 2017-10-13 DIAGNOSIS — N183 Chronic kidney disease, stage 3 (moderate): Secondary | ICD-10-CM | POA: Diagnosis not present

## 2017-10-13 DIAGNOSIS — R5383 Other fatigue: Secondary | ICD-10-CM | POA: Diagnosis not present

## 2017-10-13 LAB — CBC
HCT: 28.1 % — ABNORMAL LOW (ref 36.0–46.0)
Hemoglobin: 7.8 g/dL — ABNORMAL LOW (ref 12.0–15.0)
MCH: 23.7 pg — AB (ref 26.0–34.0)
MCHC: 27.8 g/dL — ABNORMAL LOW (ref 30.0–36.0)
MCV: 85.4 fL (ref 80.0–100.0)
PLATELETS: 336 10*3/uL (ref 150–400)
RBC: 3.29 MIL/uL — AB (ref 3.87–5.11)
RDW: 20.2 % — ABNORMAL HIGH (ref 11.5–15.5)
WBC: 8.8 10*3/uL (ref 4.0–10.5)
nRBC: 0 % (ref 0.0–0.2)

## 2017-10-13 LAB — URINALYSIS, ROUTINE W REFLEX MICROSCOPIC
Bilirubin Urine: NEGATIVE
GLUCOSE, UA: NEGATIVE mg/dL
Hgb urine dipstick: NEGATIVE
KETONES UR: NEGATIVE mg/dL
Nitrite: NEGATIVE
Protein, ur: NEGATIVE mg/dL
Specific Gravity, Urine: 1.023 (ref 1.005–1.030)
WBC, UA: >50 WBC/hpf — ABNORMAL HIGH (ref 0–5)
pH: 5 (ref 5.0–8.0)

## 2017-10-13 LAB — BASIC METABOLIC PANEL
ANION GAP: 8 (ref 5–15)
BUN: 35 mg/dL — ABNORMAL HIGH (ref 8–23)
CALCIUM: 8.7 mg/dL — AB (ref 8.9–10.3)
CO2: 22 mmol/L (ref 22–32)
Chloride: 110 mmol/L (ref 98–111)
Creatinine, Ser: 0.89 mg/dL (ref 0.44–1.00)
GFR, EST NON AFRICAN AMERICAN: 55 mL/min — AB (ref >60)
GLUCOSE: 90 mg/dL (ref 70–99)
Potassium: 4.6 mmol/L (ref 3.5–5.1)
Sodium: 140 mmol/L (ref 135–145)

## 2017-10-13 LAB — POC OCCULT BLOOD, ED: Fecal Occult Bld: NEGATIVE

## 2017-10-13 MED ORDER — FOSFOMYCIN TROMETHAMINE 3 G PO PACK
3.0000 g | PACK | Freq: Once | ORAL | Status: DC
  Filled 2017-10-13: qty 3

## 2017-10-13 NOTE — ED Triage Notes (Signed)
Pt reports going to doctors last week for feeling "rotten." Pt reports weakness for the last 3-4 months with weight loss as well. Pt reports decrease in appetite. Pt smells of urine. Pt wanting a fluid bolus/ "clear jug of water" to help her feel better. Denies pain. Not on blood thinners.

## 2017-10-13 NOTE — ED Provider Notes (Signed)
Patient placed in Quick Look pathway, seen and evaluated   Chief Complaint: malaise  HPI:   Generalized malaise for several months (since the summer), decreased appetite, daytime somnolence.  Was seen by PCP last week and got blood work that was normal, was told to come to ER if she did not feel better.   ROS: negative: fever, cough, CP, Sob, abdominal pain, diarrhea.   Physical Exam:   Gen: No distress  Neuro: Awake and Alert  Skin: Warm   Focused Exam: MMM. RRR. Lungs CTAB.   Initiation of care has begun. The patient has been counseled on the process, plan, and necessity for staying for the completion/evaluation, and the remainder of the medical screening examination    Arlean Hopping 10/13/17 1805    Quintella Reichert, MD 10/14/17 [phone]

## 2017-10-13 NOTE — ED Provider Notes (Signed)
[facility] EMERGENCY DEPARTMENT Provider Note   CSN: [id number] Arrival date & time: 10/13/17  1725     History   Chief Complaint Chief Complaint  Patient presents with  . Weakness  . Failure To Thrive    HPI Andrea Gilmore is a 84 y.o. female.  HPI   84 year old female PMH treatment for iron deficiency anemia, distant breast cancer, CKD stage III, history of NSTEMI, presents with chief complaint of weakness.  Patient states that for 2 3 months she has had progressively worsening weakness and malaise.  Patient endorses decreased p.o. intake to the point where she hardly eats every day.  Patient has lost significant weight, over the last 3 months, currently weighing only 87 pounds.  Patient was seen by PCP recently for this who told patient to go to the ER if she did not feel better.  Patient states that tonight she did not feel better so she decided to come in.  Patient denies recent falls.  Endorses baseline shortness of breath upon ambulating yet states that this is chronic and secondary to her bilateral lower extremity braces.  Denies melena or blood in stool.  Denies recent infectious symptoms.  Denies acute change recently.  Past Medical History:  Diagnosis Date  . Arthritis   . Breast cancer ([place])    breast cancer  . Cardiomyopathy ([place])    a. possibly ischemic >> NSTEMI after should surgery in 07/2014 >>Echo 07/29/14:  EF 65%;  EF 08/01/14:  EF 20-25%, ant-septal AK, Gr 2 DD, mod MR, mod LAE, mod TR, PASP 63 mmHg  . Chronic systolic CHF (congestive heart failure) ([place])   . CKD (chronic kidney disease)   . History of echocardiogram    Echo 9/16:  Vigorous LVF, EF 65-70%, Gr 1 DD, mild MR, mild LAE, PASP 32 mmHg  . Hypothyroidism     Patient Active Problem List   Diagnosis Date Noted  . Unilateral primary osteoarthritis, right knee 01/12/2017  . Takotsubo syndrome 12/08/2014  . S/p reverse total shoulder arthroplasty   . Cardiomyopathy ([place])   .  Iron deficiency anemia   . Aspiration pneumonia due to inhalation of milk ([place])   . Secondary cardiomyopathy ([place])   . Acute cystitis without hematuria   . Hypoxia   . Blood poisoning   . Aspiration pneumonia due to food (regurgitated) ([place])   . Chronic kidney disease, stage III (moderate) (HCC)   . UTI (lower urinary tract infection)   . Anemia, iron deficiency   . Acute blood loss anemia   . Acute encephalopathy   . Protein-calorie malnutrition, severe ([place]) 08/01/2014  . Acute systolic heart failure ([place])   . Respiratory failure ([place])   . NSTEMI (non-ST elevated myocardial infarction) ([place])   . Protein-calorie malnutrition ([place])   . Other specified hypothyroidism   . Acute pulmonary edema (HCC)   . Pleural effusion, left   . CKD (chronic kidney disease), stage III ([place]) 07/28/2014  . Metabolic encephalopathy 28/36/6294  . Acute delirium 07/28/2014  . Fever 07/28/2014  . Acute respiratory failure with hypoxia ([place]) 07/28/2014  . Metabolic acidosis 76/54/6503  . Sepsis ([place]) 07/28/2014  . Left rotator cuff tear arthropathy 07/27/2014  . History of iron deficiency anemia 04/14/2013  . Acute renal failure ([place]) 04/14/2013  . Primary open angle glaucoma 04/08/2012  . History of breast cancer 06/17/2011    Past Surgical History:  Procedure Laterality Date  . ABDOMINAL HYSTERECTOMY    . BACK SURGERY    .  BILATERAL CARPAL TUNNEL RELEASE    . CHOLECYSTECTOMY    . EYE SURGERY Right   . KNEE SURGERY    . REVERSE SHOULDER ARTHROPLASTY Left 07/27/2014   Procedure: REVERSE SHOULDER ARTHROPLASTY;  Surgeon: Tania Ade, MD;  Location: [facility];  Service: Orthopedics;  Laterality: Left;  Left reverse total shoulder arthroplasty  . SHOULDER SURGERY Bilateral   . TONSILLECTOMY       OB History   None      Home Medications    Prior to Admission medications   Medication Sig Start Date End Date Taking? Authorizing Provider  acetaminophen (TYLENOL) 500 MG tablet Take 500 mg by  mouth every 6 (six) hours as needed for mild pain.   Yes [provider]  denosumab (PROLIA) 60 MG/ML SOSY injection Inject 60 mg into the skin every 6 (six) months.   Yes [provider]  diazepam (VALIUM) 5 MG tablet Take 5 mg by mouth daily.  09/04/17  Yes [provider]  OLANZapine (ZYPREXA) 2.5 MG tablet Take 2.5 mg by mouth every evening. 09/04/17  Yes [provider]  aspirin EC 81 MG tablet Take 1 tablet (81 mg total) by mouth daily. Patient not taking: Reported on 10/13/2017 08/21/14   Liliane Shi, PA-C    Family History Family History  Problem Relation Age of Onset  . Brain cancer Mother   . Cancer - Lung Father     Social History Social History   Tobacco Use  . Smoking status: Never Smoker  . Smokeless tobacco: Never Used  Substance Use Topics  . Alcohol use: No  . Drug use: No     Allergies   Metoprolol tartrate; Brimonidine; Brinzolamide; Codeine; Dorzolamide hcl-timolol mal; Blue dyes (parenteral); Morphine and related; and Red dye   Review of Systems Review of Systems  Constitutional: Positive for appetite change, fatigue and unexpected weight change. Negative for chills and fever.  HENT: Negative for ear pain and sore throat.   Eyes: Negative for pain and visual disturbance.  Respiratory: Negative for cough and shortness of breath.   Cardiovascular: Negative for chest pain, palpitations and leg swelling.  Gastrointestinal: Negative for abdominal distention, abdominal pain, anal bleeding, blood in stool, constipation, diarrhea, nausea and vomiting.  Genitourinary: Negative for dysuria and hematuria.  Musculoskeletal: Negative for arthralgias and back pain.  Skin: Negative for color change and rash.  Neurological: Negative for seizures and syncope.  All other systems reviewed and are negative.    Physical Exam Updated Vital Signs BP (!) 130/58   Pulse (!) 58   Temp 97.9 F (36.6 C)   Resp 17   Ht 4\' 11"  (1.499 m)    Wt 39.5 kg   SpO2 98%   BMI 17.57 kg/m   Physical Exam  Constitutional: She is oriented to person, place, and time. No distress.  Cachectic  HENT:  Head: Normocephalic and atraumatic.  Eyes: Conjunctivae are normal.  Neck: Neck supple.  Cardiovascular: Normal rate and regular rhythm.  No murmur heard. Pulmonary/Chest: Effort normal and breath sounds normal. No respiratory distress.  Abdominal: Soft. There is no tenderness.  Musculoskeletal: She exhibits no edema.  Neurological: She is alert and oriented to person, place, and time.  Skin: Skin is warm and dry. Capillary refill takes less than 2 seconds.  Psychiatric: She has a normal mood and affect.  Nursing note and vitals reviewed.    ED Treatments / Results  Labs (all labs ordered are listed, but only abnormal results are  displayed) Labs Reviewed  BASIC METABOLIC PANEL - Abnormal; Notable for the following components:      Result Value   BUN 35 (*)    Calcium 8.7 (*)    GFR calc non Af Amer 55 (*)    All other components within normal limits  CBC - Abnormal; Notable for the following components:   RBC 3.29 (*)    Hemoglobin 7.8 (*)    HCT 28.1 (*)    MCH 23.7 (*)    MCHC 27.8 (*)    RDW 20.2 (*)    All other components within normal limits  URINALYSIS, ROUTINE W REFLEX MICROSCOPIC - Abnormal; Notable for the following components:   APPearance HAZY (*)    Leukocytes, UA LARGE (*)    WBC, UA >50 (*)    Bacteria, UA RARE (*)    All other components within normal limits  URINE CULTURE  OCCULT BLOOD X 1 CARD TO LAB, STOOL  POC OCCULT BLOOD, ED  CBG MONITORING, ED    EKG EKG Interpretation  Date/Time:  Tuesday October 13 2017 17:59:37 EDT Ventricular Rate:  61 PR Interval:  222 QRS Duration: 90 QT Interval:  418 QTC Calculation: 420 R Axis:   -53 Text Interpretation:  Sinus rhythm with 1st degree A-V block Pulmonary disease pattern Left anterior fascicular block Abnormal ECG Confirmed by Quintella Reichert  [phone]) on 10/13/2017 9:42:57 PM   Radiology No results found.  Procedures Procedures (including critical care time)  Medications Ordered in ED Medications  fosfomycin (MONUROL) packet 3 g (has no administration in time range)     Initial Impression / Assessment and Plan / ED Course  I have reviewed the triage vital signs and the nursing notes.  Pertinent labs & imaging results that were available during my care of the patient were reviewed by me and considered in my medical decision making (see chart for details).     84 year old female PMH treatment for iron deficiency anemia, distant breast cancer, CKD stage III, history of NSTEMI, presents with chief complaint of weakness.  History as above.  DDX includes infection, dehydration, anemia, or depression.  Patient normotensive, non-tachycardic.  Labs imaging performed.  Patient with hemoglobin of 7.8, MCV 85.. BUN to creatinine greater than 20.  Urine with rare bacteria and positive leuks but negative nitrites.  Will treat patient for UTI.  Patient given IV fluids for dehydration to BUN/creatinine ratio.  Patient currently without indication for emergent blood transfusion.  Advised follow-up with outpatient with PCP for work-up and treatment of anemia.  Patient states multiple significant life changes, selling house of 73 years, losing ability to drive, dependence on her son and daughter for daily living.  Clinical suspicion for depression.  Advise follow-up with PCP for continued management/evaluation of depression.  Patient states that she is able to get around her house, perform ADLs, lives with son with frequent checks by daughter.  Patient with strong support network at home. Patient feels safe going home.  Patient is stable for discharge.  Patient seen together with my attending Dr. Ayesha Rumpf agrees with plan and disposition.   Final Clinical Impressions(s) / ED Diagnoses   Final diagnoses:  Lower urinary tract infectious  disease    ED Discharge Orders    None       Keenan Bachelor, MD 10/13/17 [id number]    Quintella Reichert, MD 10/18/17 1454

## 2017-10-16 LAB — URINE CULTURE: Culture: >=100000 — AB

## 2017-10-17 ENCOUNTER — Telehealth: Payer: Self-pay

## 2017-10-17 NOTE — Telephone Encounter (Signed)
Post ED Visit - Positive Culture Follow-up  Culture report reviewed by antimicrobial stewardship pharmacist:  []  Elenor Quinones, Pharm.D. []  Heide Guile, Pharm.D., BCPS AQ-ID []  Parks Neptune, Pharm.D., BCPS []  Alycia Rossetti, Pharm.D., BCPS []  [place], Pharm.D., BCPS, AAHIVP []  Legrand Como, Pharm.D., BCPS, AAHIVP []  Salome Arnt, PharmD, BCPS []  Johnnette Gourd, PharmD, BCPS []  Hughes Better, PharmD, BCPS [x]  Leeroy Cha, PharmD  Positive urine culture Treated with Fosfomycin, organism sensitive to the same and no further patient follow-up is required at this time.  Genia Del 10/17/2017, 11:02 AM

## 2017-10-20 ENCOUNTER — Encounter (HOSPITAL_COMMUNITY): Payer: Medicare Other

## 2017-10-20 DIAGNOSIS — H401133 Primary open-angle glaucoma, bilateral, severe stage: Secondary | ICD-10-CM | POA: Diagnosis not present

## 2017-10-27 DIAGNOSIS — N390 Urinary tract infection, site not specified: Secondary | ICD-10-CM | POA: Diagnosis not present

## 2017-10-27 DIAGNOSIS — I440 Atrioventricular block, first degree: Secondary | ICD-10-CM | POA: Diagnosis not present

## 2017-10-27 DIAGNOSIS — E46 Unspecified protein-calorie malnutrition: Secondary | ICD-10-CM | POA: Diagnosis not present

## 2017-10-27 DIAGNOSIS — F322 Major depressive disorder, single episode, severe without psychotic features: Secondary | ICD-10-CM | POA: Diagnosis not present

## 2017-10-27 DIAGNOSIS — D649 Anemia, unspecified: Secondary | ICD-10-CM | POA: Diagnosis not present

## 2017-10-27 DIAGNOSIS — E039 Hypothyroidism, unspecified: Secondary | ICD-10-CM | POA: Diagnosis not present

## 2017-11-02 ENCOUNTER — Ambulatory Visit (HOSPITAL_COMMUNITY)
Admission: RE | Admit: 2017-11-02 | Discharge: 2017-11-02 | Disposition: A | Discharge status: Home / Self Care | Payer: Medicare Other | Source: Ambulatory Visit | Attending: Family Medicine | Admitting: Family Medicine

## 2017-11-02 DIAGNOSIS — D509 Iron deficiency anemia, unspecified: Secondary | ICD-10-CM | POA: Insufficient documentation

## 2017-11-02 MED ORDER — SODIUM CHLORIDE 0.9 % IV SOLN
INTRAVENOUS | Status: DC | PRN
  Administered 2017-11-02: 250 mL via INTRAVENOUS

## 2017-11-02 MED ORDER — SODIUM CHLORIDE 0.9 % IV SOLN
510.0000 mg | Freq: Once | INTRAVENOUS | Status: AC
  Administered 2017-11-02: 510 mg via INTRAVENOUS
  Filled 2017-11-02: qty 17

## 2017-11-02 NOTE — Discharge Instructions (Signed)

## 2017-11-02 NOTE — Progress Notes (Signed)
Patient received Feraheme via PIV. Observed for at least 30 minutes post infusion.Tolerated well, vitals stable, discharge instructions given to patient, both patient and daughter  verbalized understanding. Patient alert, oriented and left in a wheel chair at the time of discharge. Daughter to set up the next appointment.

## 2017-11-06 ENCOUNTER — Encounter (HOSPITAL_COMMUNITY): Payer: Medicare Other

## 2017-11-09 ENCOUNTER — Ambulatory Visit (HOSPITAL_COMMUNITY)
Admission: RE | Admit: 2017-11-09 | Discharge: 2017-11-09 | Disposition: A | Discharge status: Home / Self Care | Payer: Medicare Other | Source: Ambulatory Visit | Attending: Family Medicine | Admitting: Family Medicine

## 2017-11-09 DIAGNOSIS — D509 Iron deficiency anemia, unspecified: Secondary | ICD-10-CM | POA: Diagnosis not present

## 2017-11-09 MED ORDER — SODIUM CHLORIDE 0.9 % IV SOLN
510.0000 mg | Freq: Once | INTRAVENOUS | Status: AC
  Administered 2017-11-09: 510 mg via INTRAVENOUS
  Filled 2017-11-09: qty 17

## 2017-11-09 MED ORDER — SODIUM CHLORIDE 0.9 % IV SOLN
INTRAVENOUS | Status: DC | PRN
  Administered 2017-11-09: 250 mL via INTRAVENOUS

## 2017-11-09 NOTE — Progress Notes (Signed)
Patient received Feraheme via PIV. Observed for at least 30 minutes post infusion.Tolerated well, vitals stable, discharge instructions given to patient, both patient and daughter verbalized understanding. Patient alert, oriented and ambulatory using a walker at the time of discharge.

## 2017-11-09 NOTE — Discharge Instructions (Signed)

## 2017-11-23 DIAGNOSIS — Z111 Encounter for screening for respiratory tuberculosis: Secondary | ICD-10-CM | POA: Diagnosis not present

## 2017-12-10 ENCOUNTER — Emergency Department (HOSPITAL_COMMUNITY): Payer: Medicare Other

## 2017-12-10 ENCOUNTER — Other Ambulatory Visit: Payer: Self-pay

## 2017-12-10 ENCOUNTER — Emergency Department (HOSPITAL_COMMUNITY)
Admission: EM | Admit: 2017-12-10 | Discharge: 2017-12-10 | Disposition: A | Discharge status: Home / Self Care | Payer: Medicare Other | Attending: Emergency Medicine | Admitting: Emergency Medicine

## 2017-12-10 DIAGNOSIS — R58 Hemorrhage, not elsewhere classified: Secondary | ICD-10-CM | POA: Diagnosis not present

## 2017-12-10 DIAGNOSIS — E039 Hypothyroidism, unspecified: Secondary | ICD-10-CM | POA: Insufficient documentation

## 2017-12-10 DIAGNOSIS — N183 Chronic kidney disease, stage 3 (moderate): Secondary | ICD-10-CM | POA: Insufficient documentation

## 2017-12-10 DIAGNOSIS — Y999 Unspecified external cause status: Secondary | ICD-10-CM | POA: Diagnosis not present

## 2017-12-10 DIAGNOSIS — W010XXA Fall on same level from slipping, tripping and stumbling without subsequent striking against object, initial encounter: Secondary | ICD-10-CM | POA: Diagnosis not present

## 2017-12-10 DIAGNOSIS — Y92480 Sidewalk as the place of occurrence of the external cause: Secondary | ICD-10-CM | POA: Diagnosis not present

## 2017-12-10 DIAGNOSIS — S79912A Unspecified injury of left hip, initial encounter: Secondary | ICD-10-CM | POA: Diagnosis not present

## 2017-12-10 DIAGNOSIS — W19XXXA Unspecified fall, initial encounter: Secondary | ICD-10-CM

## 2017-12-10 DIAGNOSIS — M47816 Spondylosis without myelopathy or radiculopathy, lumbar region: Secondary | ICD-10-CM | POA: Diagnosis not present

## 2017-12-10 DIAGNOSIS — S0101XA Laceration without foreign body of scalp, initial encounter: Secondary | ICD-10-CM | POA: Insufficient documentation

## 2017-12-10 DIAGNOSIS — Z853 Personal history of malignant neoplasm of breast: Secondary | ICD-10-CM | POA: Diagnosis not present

## 2017-12-10 DIAGNOSIS — I5022 Chronic systolic (congestive) heart failure: Secondary | ICD-10-CM | POA: Diagnosis not present

## 2017-12-10 DIAGNOSIS — M5136 Other intervertebral disc degeneration, lumbar region: Secondary | ICD-10-CM | POA: Diagnosis not present

## 2017-12-10 DIAGNOSIS — I130 Hypertensive heart and chronic kidney disease with heart failure and stage 1 through stage 4 chronic kidney disease, or unspecified chronic kidney disease: Secondary | ICD-10-CM | POA: Insufficient documentation

## 2017-12-10 DIAGNOSIS — S199XXA Unspecified injury of neck, initial encounter: Secondary | ICD-10-CM | POA: Diagnosis not present

## 2017-12-10 DIAGNOSIS — Y9301 Activity, walking, marching and hiking: Secondary | ICD-10-CM | POA: Diagnosis not present

## 2017-12-10 DIAGNOSIS — M5416 Radiculopathy, lumbar region: Secondary | ICD-10-CM | POA: Diagnosis not present

## 2017-12-10 DIAGNOSIS — S0990XA Unspecified injury of head, initial encounter: Secondary | ICD-10-CM | POA: Diagnosis not present

## 2017-12-10 DIAGNOSIS — M48062 Spinal stenosis, lumbar region with neurogenic claudication: Secondary | ICD-10-CM | POA: Diagnosis not present

## 2017-12-10 DIAGNOSIS — M4316 Spondylolisthesis, lumbar region: Secondary | ICD-10-CM | POA: Diagnosis not present

## 2017-12-10 DIAGNOSIS — M25552 Pain in left hip: Secondary | ICD-10-CM | POA: Insufficient documentation

## 2017-12-10 MED ORDER — LIDOCAINE-EPINEPHRINE (PF) 2 %-1:200000 IJ SOLN
20.0000 mL | Freq: Once | INTRAMUSCULAR | Status: AC
  Administered 2017-12-10: 20 mL
  Filled 2017-12-10: qty 20

## 2017-12-10 NOTE — Discharge Instructions (Signed)
You were seen in the ED today after fall. Your CT scan and x-ray of the hip was normal. You will need to keep your scalp clean and dry. Have staples removed in 1 week either at your PCP or in the ED.

## 2017-12-10 NOTE — ED Notes (Signed)
Items and cart ready for provider procedure

## 2017-12-10 NOTE — ED Provider Notes (Signed)
Emergency Department Provider Note   I have reviewed the triage vital signs and the nursing notes.   HISTORY  Chief Complaint Fall   HPI Andrea Gilmore is a 84 y.o. female with PMH of Cardiomyopathy, Neuopathy, chronic back pain, and CKD presents to the emergency department for evaluation after mechanical fall.  The patient had gone to her spine doctor appointment today and was returning back to her assisted living facility.  Is walking over some uneven pavement and lost her balance.  She fell backwards and struck her head on the ground.  No loss of consciousness.  No confusion or vomiting since the incident.  EMS and family notes some bleeding and laceration to the back of the head.  Patient also complaining of pain in the left hip that is worse with movement.  She has baseline lower back pain without numbness or tingling.  No weakness.    Past Medical History:  Diagnosis Date  . Arthritis   . Breast cancer ([place])    breast cancer  . Cardiomyopathy ([place])    a. possibly ischemic >> NSTEMI after should surgery in 07/2014 >>Echo 07/29/14:  EF 65%;  EF 08/01/14:  EF 20-25%, ant-septal AK, Gr 2 DD, mod MR, mod LAE, mod TR, PASP 63 mmHg  . Chronic systolic CHF (congestive heart failure) ([place])   . CKD (chronic kidney disease)   . History of echocardiogram    Echo 9/16:  Vigorous LVF, EF 65-70%, Gr 1 DD, mild MR, mild LAE, PASP 32 mmHg  . Hypothyroidism     Patient Active Problem List   Diagnosis Date Noted  . Unilateral primary osteoarthritis, right knee 01/12/2017  . Takotsubo syndrome 12/08/2014  . S/p reverse total shoulder arthroplasty   . Cardiomyopathy ([place])   . Iron deficiency anemia   . Aspiration pneumonia due to inhalation of milk ([place])   . Secondary cardiomyopathy ([place])   . Acute cystitis without hematuria   . Hypoxia   . Blood poisoning   . Aspiration pneumonia due to food (regurgitated) ([place])   . Chronic kidney disease, stage III (moderate) (HCC)   . UTI (lower  urinary tract infection)   . Anemia, iron deficiency   . Acute blood loss anemia   . Acute encephalopathy   . Protein-calorie malnutrition, severe ([place]) 08/01/2014  . Acute systolic heart failure ([place])   . Respiratory failure ([place])   . NSTEMI (non-ST elevated myocardial infarction) ([place])   . Protein-calorie malnutrition ([place])   . Other specified hypothyroidism   . Acute pulmonary edema (HCC)   . Pleural effusion, left   . CKD (chronic kidney disease), stage III ([place]) 07/28/2014  . Metabolic encephalopathy 62/37/6283  . Acute delirium 07/28/2014  . Fever 07/28/2014  . Acute respiratory failure with hypoxia ([place]) 07/28/2014  . Metabolic acidosis 15/17/6160  . Sepsis ([place]) 07/28/2014  . Left rotator cuff tear arthropathy 07/27/2014  . History of iron deficiency anemia 04/14/2013  . Acute renal failure ([place]) 04/14/2013  . Primary open angle glaucoma 04/08/2012  . History of breast cancer 06/17/2011    Past Surgical History:  Procedure Laterality Date  . ABDOMINAL HYSTERECTOMY    . BACK SURGERY    . BILATERAL CARPAL TUNNEL RELEASE    . CHOLECYSTECTOMY    . EYE SURGERY Right   . KNEE SURGERY    . REVERSE SHOULDER ARTHROPLASTY Left 07/27/2014   Procedure: REVERSE SHOULDER ARTHROPLASTY;  Surgeon: Tania Ade, MD;  Location: [facility];  Service: Orthopedics;  Laterality: Left;  Left reverse total shoulder arthroplasty  . SHOULDER SURGERY Bilateral   . TONSILLECTOMY     Allergies Metoprolol tartrate; Brimonidine; Brinzolamide; Codeine; Dorzolamide hcl-timolol mal; Blue dyes (parenteral); Morphine and related; and Red dye  Family History  Problem Relation Age of Onset  . Brain cancer Mother   . Cancer - Lung Father     Social History Social History   Tobacco Use  . Smoking status: Never Smoker  . Smokeless tobacco: Never Used  Substance Use Topics  . Alcohol use: No  . Drug use: No    Review of Systems  Constitutional: No fever/chills Eyes: No visual changes. ENT:  No sore throat. Cardiovascular: Denies chest pain. Respiratory: Denies shortness of breath. Gastrointestinal: No abdominal pain.  No nausea, no vomiting.  No diarrhea.  No constipation. Genitourinary: Negative for dysuria. Musculoskeletal: Negative for back pain. Positive left hip pain.  Skin: Negative for rash. Neurological: Negative for focal weakness or numbness. Positive HA.   10-point ROS otherwise negative.  ____________________________________________   PHYSICAL EXAM:  VITAL SIGNS: ED Triage Vitals [12/10/17 1832]  Enc Vitals Group     BP (!) 157/78     Pulse Rate 87     Resp 18     Temp 98.1 F (36.7 C)     Temp Source Oral     SpO2 99 %   Constitutional: Alert and oriented. Well appearing and in no acute distress. Eyes: Conjunctivae are normal.  Head: Right posterior abrasion with adjacent laceration.  Nose: No congestion/rhinnorhea. Mouth/Throat: Mucous membranes are moist.  Neck: No stridor. No tenderness to palpation of the cervical spine.  Cardiovascular: Normal rate, regular rhythm. Good peripheral circulation. Grossly normal heart sounds.   Respiratory: Normal respiratory effort.  No retractions. Lungs CTAB. Gastrointestinal: Soft and nontender. No distention.  Musculoskeletal: No lower extremity tenderness nor edema. No gross deformities of extremities. Neurologic:  Normal speech and language. No gross focal neurologic deficits are appreciated.  Skin:  Skin is warm, dry and intact. Abrasion/laceration to scalp as noted.   ____________________________________________  RADIOLOGY  Ct Head Wo Contrast  Result Date: 12/10/2017 CLINICAL DATA:  Transported by [facility] from parking lot. experienced a fall and has laceration to back of head. Bleeding controlled, no LOC and no pain reported. +C-Collar intact. AAO x3 per baseline. Pt states her hip and leg hurt EXAM: CT HEAD WITHOUT CONTRAST CT MAXILLOFACIAL WITHOUT CONTRAST CT CERVICAL SPINE WITHOUT CONTRAST  TECHNIQUE: Multidetector CT imaging of the head, cervical spine, and maxillofacial structures were performed using the standard protocol without intravenous contrast. Multiplanar CT image reconstructions of the cervical spine and maxillofacial structures were also generated. COMPARISON:  07/28/2014 CT head FINDINGS: CT HEAD FINDINGS Brain: There is mild central and cortical atrophy. There is no intra or extra-axial fluid collection or mass lesion. The basilar cisterns and ventricles have a normal appearance. There is no CT evidence for acute infarction or hemorrhage. Vascular: There is atherosclerotic calcification of the internal carotid arteries. No hyperdense vessels. Skull: Normal. Negative for fracture or focal lesion. Other: RIGHT posterior parietal scalp edema and laceration. No underlying calvarial fracture. CT MAXILLOFACIAL FINDINGS Osseous: No fracture or mandibular dislocation. No destructive process. Orbits: Negative. No traumatic or inflammatory finding. Sinuses: Clear. Soft tissues: Negative. CT CERVICAL SPINE FINDINGS Alignment: Normal. Skull base and vertebrae: No acute fracture. No primary bone lesion or focal pathologic process. Soft tissues and spinal canal: No prevertebral fluid or swelling. No visible canal hematoma. Disc levels:  Disc height loss at C4-5, C5-6,  and C6-7. Upper chest: Negative. Other: None IMPRESSION: 1. Atrophy. 2.  No evidence for acute intracranial abnormality. 3. RIGHT posterior parietal scalp edema and laceration without underlying calvarial fracture or intracranial hemorrhage. 4.  No evidence for acute maxillofacial abnormality. 5.  No evidence for acute cervical spine abnormality. 6. Cervical spondylosis. Electronically Signed   By: Nolon Nations M.D.   On: 12/10/2017 19:53   Ct Cervical Spine Wo Contrast  Result Date: 12/10/2017 CLINICAL DATA:  Transported by [facility] from parking lot. experienced a fall and has laceration to back of head. Bleeding controlled, no LOC  and no pain reported. +C-Collar intact. AAO x3 per baseline. Pt states her hip and leg hurt EXAM: CT HEAD WITHOUT CONTRAST CT MAXILLOFACIAL WITHOUT CONTRAST CT CERVICAL SPINE WITHOUT CONTRAST TECHNIQUE: Multidetector CT imaging of the head, cervical spine, and maxillofacial structures were performed using the standard protocol without intravenous contrast. Multiplanar CT image reconstructions of the cervical spine and maxillofacial structures were also generated. COMPARISON:  07/28/2014 CT head FINDINGS: CT HEAD FINDINGS Brain: There is mild central and cortical atrophy. There is no intra or extra-axial fluid collection or mass lesion. The basilar cisterns and ventricles have a normal appearance. There is no CT evidence for acute infarction or hemorrhage. Vascular: There is atherosclerotic calcification of the internal carotid arteries. No hyperdense vessels. Skull: Normal. Negative for fracture or focal lesion. Other: RIGHT posterior parietal scalp edema and laceration. No underlying calvarial fracture. CT MAXILLOFACIAL FINDINGS Osseous: No fracture or mandibular dislocation. No destructive process. Orbits: Negative. No traumatic or inflammatory finding. Sinuses: Clear. Soft tissues: Negative. CT CERVICAL SPINE FINDINGS Alignment: Normal. Skull base and vertebrae: No acute fracture. No primary bone lesion or focal pathologic process. Soft tissues and spinal canal: No prevertebral fluid or swelling. No visible canal hematoma. Disc levels:  Disc height loss at C4-5, C5-6, and C6-7. Upper chest: Negative. Other: None IMPRESSION: 1. Atrophy. 2.  No evidence for acute intracranial abnormality. 3. RIGHT posterior parietal scalp edema and laceration without underlying calvarial fracture or intracranial hemorrhage. 4.  No evidence for acute maxillofacial abnormality. 5.  No evidence for acute cervical spine abnormality. 6. Cervical spondylosis. Electronically Signed   By: Nolon Nations M.D.   On: 12/10/2017 19:53   Dg  Hip Unilat W Or Wo Pelvis 2-3 Views Left  Result Date: 12/10/2017 CLINICAL DATA:  Patient fell os fall today. Left hip pain. EXAM: DG HIP (WITH OR WITHOUT PELVIS) 2-3V LEFT COMPARISON:  Pelvic MRI 02/05/2015 FINDINGS: Remote right superior and inferior pubic rami fractures with heterotopic ossifications adjacent to the inferior pubic ramus. There is caudal displacement of the right pubic rami likely related to prior trauma. Intact left hip with probable intra-articular loose bodies projecting about and over the left proximal femur. Alternatively some of these densities may represent granulomata. The patient is status post lumbar fusion with interbody cages at L5-S1. Degenerative joint space narrowing is seen both hips. No acute appearing pelvic fracture is identified. No pelvic diastasis. IMPRESSION: 1. Remote right-sided pubic rami fractures. 2. No acute osseous abnormality of the left hip. Electronically Signed   By: Ashley Royalty M.D.   On: 12/10/2017 19:28   Ct Maxillofacial Wo Contrast  Result Date: 12/10/2017 CLINICAL DATA:  Transported by [facility] from parking lot. experienced a fall and has laceration to back of head. Bleeding controlled, no LOC and no pain reported. +C-Collar intact. AAO x3 per baseline. Pt states her hip and leg hurt EXAM: CT HEAD WITHOUT CONTRAST CT MAXILLOFACIAL WITHOUT CONTRAST  CT CERVICAL SPINE WITHOUT CONTRAST TECHNIQUE: Multidetector CT imaging of the head, cervical spine, and maxillofacial structures were performed using the standard protocol without intravenous contrast. Multiplanar CT image reconstructions of the cervical spine and maxillofacial structures were also generated. COMPARISON:  07/28/2014 CT head FINDINGS: CT HEAD FINDINGS Brain: There is mild central and cortical atrophy. There is no intra or extra-axial fluid collection or mass lesion. The basilar cisterns and ventricles have a normal appearance. There is no CT evidence for acute infarction or hemorrhage. Vascular:  There is atherosclerotic calcification of the internal carotid arteries. No hyperdense vessels. Skull: Normal. Negative for fracture or focal lesion. Other: RIGHT posterior parietal scalp edema and laceration. No underlying calvarial fracture. CT MAXILLOFACIAL FINDINGS Osseous: No fracture or mandibular dislocation. No destructive process. Orbits: Negative. No traumatic or inflammatory finding. Sinuses: Clear. Soft tissues: Negative. CT CERVICAL SPINE FINDINGS Alignment: Normal. Skull base and vertebrae: No acute fracture. No primary bone lesion or focal pathologic process. Soft tissues and spinal canal: No prevertebral fluid or swelling. No visible canal hematoma. Disc levels:  Disc height loss at C4-5, C5-6, and C6-7. Upper chest: Negative. Other: None IMPRESSION: 1. Atrophy. 2.  No evidence for acute intracranial abnormality. 3. RIGHT posterior parietal scalp edema and laceration without underlying calvarial fracture or intracranial hemorrhage. 4.  No evidence for acute maxillofacial abnormality. 5.  No evidence for acute cervical spine abnormality. 6. Cervical spondylosis. Electronically Signed   By: Nolon Nations M.D.   On: 12/10/2017 19:53    ____________________________________________   PROCEDURES  Procedure(s) performed:   Marland KitchenMarland KitchenLaceration Repair Date/Time: 12/10/2017 10:44 PM Performed by: Margette Fast, MD Authorized by: Margette Fast, MD   Consent:    Consent obtained:  Verbal   Consent given by:  Patient   Risks discussed:  Need for additional repair, nerve damage, infection, pain, poor cosmetic result, poor wound healing, retained foreign body, tendon damage and vascular damage   Alternatives discussed:  No treatment Anesthesia (see MAR for exact dosages):    Anesthesia method:  Local infiltration   Local anesthetic:  Lidocaine 2% WITH epi Laceration details:    Location:  Scalp   Scalp location:  Occipital   Length (cm):  1 Repair type:    Repair type:   Simple Pre-procedure details:    Preparation:  Imaging obtained to evaluate for foreign bodies and patient was prepped and draped in usual sterile fashion Exploration:    Hemostasis achieved with:  Direct pressure   Wound exploration: wound explored through full range of motion and entire depth of wound probed and visualized     Wound extent: no foreign bodies/material noted and no underlying fracture noted     Contaminated: no   Treatment:    Area cleansed with:  Betadine and saline   Amount of cleaning:  Standard   Irrigation solution:  Sterile saline   Visualized foreign bodies/material removed: no   Skin repair:    Repair method:  Staples   Number of staples:  2 Approximation:    Approximation:  Loose Post-procedure details:    Dressing:  Open (no dressing)   Patient tolerance of procedure:  Tolerated well, no immediate complications   ____________________________________________   INITIAL IMPRESSION / ASSESSMENT AND PLAN / ED COURSE  Pertinent labs & imaging results that were available during my care of the patient were reviewed by me and considered in my medical decision making (see chart for details).  Patient presents to the emergency department for evaluation after mechanical  fall.  Patient has a laceration of the posterior scalp.  Plan for CT imaging of the head and neck.  Daughter also describing some face swelling and bruising which she thinks may have been from a separate, recent fall.  Patient has pain with passive range of motion of the left hip.  Plan for plain film imaging and reassess.  CT imaging and plain film negative. Laceration repaired. Mostly abrasion on further evaluation but two linear areas that were loosely approximated with staples. Patient tolerated well. Discussed plan for staple removal in 1 week.  ____________________________________________  FINAL CLINICAL IMPRESSION(S) / ED DIAGNOSES  Final diagnoses:  Fall, initial encounter  Laceration of  scalp, initial encounter  Left hip pain     MEDICATIONS GIVEN DURING THIS VISIT:  Medications  lidocaine-EPINEPHrine (XYLOCAINE W/EPI) 2 %-1:200000 (PF) injection 20 mL (20 mLs Infiltration Given by Other 12/10/17 2207)    Note:  This document was prepared using Dragon voice recognition software and may include unintentional dictation errors.  Nanda Quinton, MD Emergency Medicine    Verle Wheeling, [facility], MD 12/10/17 [phone]

## 2017-12-10 NOTE — ED Notes (Signed)
MD at bedside. 

## 2017-12-10 NOTE — ED Triage Notes (Signed)
Transported by [facility] from parking lot--experienced a fall and has laceration to back of head. Bleeding controlled, no LOC and no pain reported. +C-Collar intact. AAO x3 per baseline.

## 2017-12-10 NOTE — ED Notes (Signed)
Bed: WA05 Expected date:  Expected time:  Means of arrival:  Comments: EMS-fall 

## 2017-12-17 DIAGNOSIS — R4189 Other symptoms and signs involving cognitive functions and awareness: Secondary | ICD-10-CM | POA: Diagnosis not present

## 2017-12-17 DIAGNOSIS — W19XXXS Unspecified fall, sequela: Secondary | ICD-10-CM | POA: Diagnosis not present

## 2017-12-17 DIAGNOSIS — Z9181 History of falling: Secondary | ICD-10-CM | POA: Diagnosis not present

## 2017-12-17 DIAGNOSIS — R260 Ataxic gait: Secondary | ICD-10-CM | POA: Diagnosis not present

## 2017-12-17 DIAGNOSIS — S0101XD Laceration without foreign body of scalp, subsequent encounter: Secondary | ICD-10-CM | POA: Diagnosis not present

## 2017-12-17 DIAGNOSIS — M549 Dorsalgia, unspecified: Secondary | ICD-10-CM | POA: Diagnosis not present

## 2017-12-17 DIAGNOSIS — E46 Unspecified protein-calorie malnutrition: Secondary | ICD-10-CM | POA: Diagnosis not present

## 2017-12-18 ENCOUNTER — Emergency Department (HOSPITAL_COMMUNITY): Payer: Medicare Other

## 2017-12-18 ENCOUNTER — Other Ambulatory Visit: Payer: Self-pay

## 2017-12-18 ENCOUNTER — Encounter (HOSPITAL_COMMUNITY): Payer: Self-pay

## 2017-12-18 ENCOUNTER — Emergency Department (HOSPITAL_COMMUNITY)
Admission: EM | Admit: 2017-12-18 | Discharge: 2017-12-19 | Disposition: A | Discharge status: Skilled Nursing Facility | Payer: Medicare Other | Attending: Emergency Medicine | Admitting: Emergency Medicine

## 2017-12-18 DIAGNOSIS — M545 Low back pain: Secondary | ICD-10-CM | POA: Insufficient documentation

## 2017-12-18 DIAGNOSIS — R64 Cachexia: Secondary | ICD-10-CM | POA: Diagnosis not present

## 2017-12-18 DIAGNOSIS — R531 Weakness: Secondary | ICD-10-CM | POA: Diagnosis not present

## 2017-12-18 DIAGNOSIS — Z79899 Other long term (current) drug therapy: Secondary | ICD-10-CM | POA: Diagnosis not present

## 2017-12-18 DIAGNOSIS — I5022 Chronic systolic (congestive) heart failure: Secondary | ICD-10-CM | POA: Diagnosis not present

## 2017-12-18 DIAGNOSIS — S3993XA Unspecified injury of pelvis, initial encounter: Secondary | ICD-10-CM | POA: Diagnosis not present

## 2017-12-18 DIAGNOSIS — W19XXXA Unspecified fall, initial encounter: Secondary | ICD-10-CM | POA: Diagnosis not present

## 2017-12-18 DIAGNOSIS — Z853 Personal history of malignant neoplasm of breast: Secondary | ICD-10-CM | POA: Insufficient documentation

## 2017-12-18 DIAGNOSIS — R102 Pelvic and perineal pain: Secondary | ICD-10-CM | POA: Diagnosis not present

## 2017-12-18 DIAGNOSIS — I130 Hypertensive heart and chronic kidney disease with heart failure and stage 1 through stage 4 chronic kidney disease, or unspecified chronic kidney disease: Secondary | ICD-10-CM | POA: Insufficient documentation

## 2017-12-18 DIAGNOSIS — M79605 Pain in left leg: Secondary | ICD-10-CM | POA: Insufficient documentation

## 2017-12-18 DIAGNOSIS — S0003XA Contusion of scalp, initial encounter: Secondary | ICD-10-CM | POA: Diagnosis not present

## 2017-12-18 DIAGNOSIS — K449 Diaphragmatic hernia without obstruction or gangrene: Secondary | ICD-10-CM | POA: Diagnosis not present

## 2017-12-18 DIAGNOSIS — E039 Hypothyroidism, unspecified: Secondary | ICD-10-CM | POA: Insufficient documentation

## 2017-12-18 DIAGNOSIS — I444 Left anterior fascicular block: Secondary | ICD-10-CM | POA: Diagnosis not present

## 2017-12-18 DIAGNOSIS — S31819A Unspecified open wound of right buttock, initial encounter: Secondary | ICD-10-CM | POA: Diagnosis not present

## 2017-12-18 DIAGNOSIS — S199XXA Unspecified injury of neck, initial encounter: Secondary | ICD-10-CM | POA: Diagnosis not present

## 2017-12-18 DIAGNOSIS — R109 Unspecified abdominal pain: Secondary | ICD-10-CM | POA: Diagnosis not present

## 2017-12-18 DIAGNOSIS — N183 Chronic kidney disease, stage 3 (moderate): Secondary | ICD-10-CM | POA: Diagnosis not present

## 2017-12-18 LAB — COMPREHENSIVE METABOLIC PANEL
ALT: 16 U/L (ref 0–44)
AST: 24 U/L (ref 15–41)
Albumin: 4 g/dL (ref 3.5–5.0)
Alkaline Phosphatase: 48 U/L (ref 38–126)
Anion gap: 12 (ref 5–15)
BUN: 28 mg/dL — AB (ref 8–23)
CO2: 26 mmol/L (ref 22–32)
Calcium: 8.5 mg/dL — ABNORMAL LOW (ref 8.9–10.3)
Chloride: 105 mmol/L (ref 98–111)
Creatinine, Ser: 0.89 mg/dL (ref 0.44–1.00)
GFR calc Af Amer: >60 mL/min (ref >60)
GFR calc non Af Amer: 57 mL/min — ABNORMAL LOW (ref >60)
Glucose, Bld: 90 mg/dL (ref 70–99)
Potassium: 4.3 mmol/L (ref 3.5–5.1)
Sodium: 143 mmol/L (ref 135–145)
Total Bilirubin: 0.7 mg/dL (ref 0.3–1.2)
Total Protein: 6.8 g/dL (ref 6.5–8.1)

## 2017-12-18 LAB — CBC WITH DIFFERENTIAL/PLATELET
Abs Immature Granulocytes: 0.06 10*3/uL (ref 0.00–0.07)
Basophils Absolute: 0 10*3/uL (ref 0.0–0.1)
Basophils Relative: 0 %
Eosinophils Absolute: 0 10*3/uL (ref 0.0–0.5)
Eosinophils Relative: 0 %
HEMATOCRIT: 36.6 % (ref 36.0–46.0)
Hemoglobin: 11.1 g/dL — ABNORMAL LOW (ref 12.0–15.0)
IMMATURE GRANULOCYTES: 1 %
LYMPHS ABS: 0.6 10*3/uL — AB (ref 0.7–4.0)
Lymphocytes Relative: 6 %
MCH: 29.9 pg (ref 26.0–34.0)
MCHC: 30.3 g/dL (ref 30.0–36.0)
MCV: 98.7 fL (ref 80.0–100.0)
Monocytes Absolute: 0.8 10*3/uL (ref 0.1–1.0)
Monocytes Relative: 9 %
Neutro Abs: 7.6 10*3/uL (ref 1.7–7.7)
Neutrophils Relative %: 84 %
Platelets: 342 10*3/uL (ref 150–400)
RBC: 3.71 MIL/uL — ABNORMAL LOW (ref 3.87–5.11)
RDW: 20.8 % — ABNORMAL HIGH (ref 11.5–15.5)
WBC: 9.2 10*3/uL (ref 4.0–10.5)
nRBC: 0 % (ref 0.0–0.2)

## 2017-12-18 LAB — URINALYSIS, ROUTINE W REFLEX MICROSCOPIC
Bilirubin Urine: NEGATIVE
Glucose, UA: NEGATIVE mg/dL
Hgb urine dipstick: NEGATIVE
KETONES UR: 5 mg/dL — AB
Nitrite: POSITIVE — AB
Protein, ur: NEGATIVE mg/dL
Specific Gravity, Urine: 1.017 (ref 1.005–1.030)
pH: 5 (ref 5.0–8.0)

## 2017-12-18 MED ORDER — TRAMADOL HCL 50 MG PO TABS
50.0000 mg | ORAL_TABLET | Freq: Once | ORAL | Status: AC
  Administered 2017-12-18: 50 mg via ORAL
  Filled 2017-12-18: qty 1

## 2017-12-18 MED ORDER — SODIUM CHLORIDE 0.9 % IV BOLUS
500.0000 mL | Freq: Once | INTRAVENOUS | Status: AC
  Administered 2017-12-18: 500 mL via INTRAVENOUS

## 2017-12-18 NOTE — Care Management (Signed)
[facility] ED CM received call from [facility] ED secretary concerning consult for placement. Discussed patient with EDP J. Elmore Guise, patient family reports weakness with recurrent falls at home patient apparently lives at home and family have been trying to have her placed from home. Patient is active with [facility] and has traditional Medicare A/B care goals per family is SNF as per EDP. Patient may be eligible for 3- day SNF placement waiver.  CM contacted the [facility] [phone] with information but unable to determine, will need to CSW assess patient for SNF prior. ED CSW was notified concerning patient.  PT eval was ordered for imminent discharge. CSW will continue to follow for placement from the ED.

## 2017-12-18 NOTE — ED Notes (Signed)
Pt placed on purewick 

## 2017-12-18 NOTE — ED Notes (Addendum)
Pt unable to stand during orthostatic vitals. PA and MD aware of that as well as pain 10/10

## 2017-12-18 NOTE — ED Notes (Signed)
Patient was feeling lightheaded during sitting portion of orthostatic vital signs.

## 2017-12-18 NOTE — Progress Notes (Signed)
CSW retrieved PASRR from [facility] MUST: [id number] with completed date 08/03/14.  No end date.  CSW will continue to follow for D/C needs.  Alphonse Guild. Iyauna Sing, LCSW, LCAS, CSI Clinical Social Worker Ph: [phone]

## 2017-12-18 NOTE — NC FL2 (Addendum)
[employer] LEVEL OF CARE SCREENING TOOL     IDENTIFICATION  Patient Name: Andrea Gilmore Birthdate: 1925/10/18 Sex: female Admission Date (Current Location): 12/18/2017  [facility] and [place] Number:  Herbalist and Address:  [facility],  [place] [address] [place]      Provider Number: [id number]  Attending Physician Name and Address:  No att. providers found  Relative Name and Phone Number:       Current Level of Care: Hospital Recommended Level of Care: [place] Prior Approval Number:    Date Approved/Denied: 08/03/14 PASRR Number: [id number]  Discharge Plan: SNF    Current Diagnoses: Patient Active Problem List   Diagnosis Date Noted  . Unilateral primary osteoarthritis, right knee 01/12/2017  . Takotsubo syndrome 12/08/2014  . S/p reverse total shoulder arthroplasty   . Cardiomyopathy ([place])   . Iron deficiency anemia   . Aspiration pneumonia due to inhalation of milk ([place])   . Secondary cardiomyopathy ([place])   . Acute cystitis without hematuria   . Hypoxia   . Blood poisoning   . Aspiration pneumonia due to food (regurgitated) ([place])   . Chronic kidney disease, stage III (moderate) (HCC)   . UTI (lower urinary tract infection)   . Anemia, iron deficiency   . Acute blood loss anemia   . Acute encephalopathy   . Protein-calorie malnutrition, severe ([place]) 08/01/2014  . Acute systolic heart failure ([place])   . Respiratory failure ([place])   . NSTEMI (non-ST elevated myocardial infarction) ([place])   . Protein-calorie malnutrition ([place])   . Other specified hypothyroidism   . Acute pulmonary edema (HCC)   . Pleural effusion, left   . CKD (chronic kidney disease), stage III ([place]) 07/28/2014  . Metabolic encephalopathy 38/75/6433  . Acute delirium 07/28/2014  . Fever 07/28/2014  . Acute respiratory failure with hypoxia ([place]) 07/28/2014  . Metabolic acidosis 29/51/8841  . Sepsis ([place]) 07/28/2014  . Left  rotator cuff tear arthropathy 07/27/2014  . History of iron deficiency anemia 04/14/2013  . Acute renal failure ([place]) 04/14/2013  . Primary open angle glaucoma 04/08/2012  . History of breast cancer 06/17/2011    Orientation RESPIRATION BLADDER Height & Weight     Self, Time, Place, Situation  Normal Incontinent Weight: 79 lb (35.8 kg) Height:     BEHAVIORAL SYMPTOMS/MOOD NEUROLOGICAL BOWEL NUTRITION STATUS      Incontinent Diet(Regular)  AMBULATORY STATUS COMMUNICATION OF NEEDS Skin   Extensive Assist Verbally Normal                       Personal Care Assistance Level of Assistance  Total care Bathing Assistance: Limited assistance Feeding assistance: Limited assistance   Total Care Assistance: Limited assistance   Functional Limitations Info             SPECIAL CARE FACTORS FREQUENCY  PT (By licensed PT), OT (By licensed OT)     PT Frequency: 5 OT Frequency: 5            Contractures Contractures Info: Not present    Additional Factors Info  Code Status, Allergies Code Status Info: Prior Allergies Info: Metoprolol Tartrate, Brimonidine, Brinzolamide, Codeine, Dorzolamide Hcl-timolol Mal, Blue Dyes (Parenteral), Morphine And Related, Red Dye           Current Medications (12/18/2017):  This is the current hospital active medication list No current facility-administered medications for this encounter.    Current Outpatient Medications  Medication Sig  Dispense Refill  . acetaminophen (TYLENOL) 500 MG tablet Take 500 mg by mouth every 6 (six) hours as needed for mild pain.    Marland Kitchen denosumab (PROLIA) 60 MG/ML SOSY injection Inject 60 mg into the skin every 6 (six) months.    .      . LUMIGAN 0.01 % SOLN Place 1 drop into both eyes at bedtime.   10              . traMADol (ULTRAM) 50 MG tablet Take 50 mg by mouth every 12 (twelve) hours as needed for moderate pain or severe pain.     Marland Kitchen aspirin EC 81 MG tablet Take 1 tablet (81 mg total) by mouth daily.  (Patient not taking: Reported on 10/13/2017)       Discharge Medications: Please see discharge summary for a list of discharge medications.  Relevant Imaging Results:  Relevant Lab Results:   Additional Information [id number]  Andrea Gilmore, LCSWA

## 2017-12-18 NOTE — Progress Notes (Addendum)
Consult request has been received. CSW attempting to follow up at present time.  CSW spoke to RN CM who relayed family's wishes that CSW be consulted for potential placement.  Pt's has Medicare A&B/BCBS.  Per RN CM, pt may be appropriate for [facility] waiver, but until PT recommendation is complete status of waiver application would be unknown.  Pt lives alone, per pt's daughter.  At this time imaging indicates no acute, rehab-able injuries, such as fractures, etc, but PA is ordering imaging of pt's hips to pursue further possible injuries/reasons for pt's pain (10/10).  CSW counseled pt's daughter on SNF's, ALF's,  how they operate, who is qualified to go there, insurances that do and don't pay for them and how it is paid for otherwise (Medicaid/Medicare).  CSW provided pt's daughter with a list of SNF's at pt's daughter's request in the [place] area and counseled pt's daughter on seeking admission and how it is sometimes attained.  CSW counseled pt's daughter on the need for acute, rehab-able injuries for insurance coverage using documented evidence (CT scans, etc) and pt's daughter stated that private pay would be an option if insurance authorization is not possible.  [name] was appreciative and thanked the CSW.  Pt was not A&OX3, and at times blurted out difficult to understand references from WWII.  CSW received verbal permission from pt's daughter to begin working the pt up, create FL-2 and send referrals out on pt's behalf and CSW will go through pt's daughter's preferences for SNF with her.  EPD updated.  Pt to go to imaging again shortly.  CSW will continue to follow for D/C needs.  Alphonse Guild. Carlyon Nolasco, LCSW, LCAS, CSI Clinical Social Worker Ph: [phone]

## 2017-12-18 NOTE — ED Triage Notes (Addendum)
Pt has hx of falls. [name] stated that pt was on the floor. Pt's son attempted to help pt up, having them both fall on the ground. Pt c/o back pain down her left leg. Pt was given tramadol approximately 60 minutes ago. Pain is worse worth movement.  Pt has also been having a decreased appetite and getting weaker.

## 2017-12-18 NOTE — ED Provider Notes (Signed)
[facility] DEPT Provider Note   CSN: [id number] Arrival date & time: 12/18/17  1318     History   Chief Complaint Chief Complaint  Patient presents with  . Fall    HPI Andrea Gilmore is a 84 y.o. female.  84 y.o female with a PMH of CKD, Cardiomyopathy, Breast CA presents to the ED brought in by daughter s/p fall yesterday. Patient was found on the floor by son yesterday morning after a fall which patient reports she does not remember the mechanism.  This is patient's second fall within the past week, she did have a fall last week which was mechanical in nature after tripping with her walker in the parking lot.  Her daughter at the bedside reports patient was seen by Dr. Brigitte Pulse yesterday placed a call for case management in order for patient to receive rehabilitation therapy along with nutrition consult as patient has decreased intake in the past couple weeks.  Per daughter patient is at her lowest weight of 79 pounds.  Daughter reports patient has pain from the fall yesterday reports the pain is mostly in her buttocks region along with her left leg which she reports is constant, she does have a prescription for tramadol which she took prior to coming into the ED this evening.Patient denies any headache, chest pain, shortness of breath or urinary symptoms.      Past Medical History:  Diagnosis Date  . Arthritis   . Breast cancer ([place])    breast cancer  . Cardiomyopathy ([place])    a. possibly ischemic >> NSTEMI after should surgery in 07/2014 >>Echo 07/29/14:  EF 65%;  EF 08/01/14:  EF 20-25%, ant-septal AK, Gr 2 DD, mod MR, mod LAE, mod TR, PASP 63 mmHg  . Chronic systolic CHF (congestive heart failure) ([place])   . CKD (chronic kidney disease)   . History of echocardiogram    Echo 9/16:  Vigorous LVF, EF 65-70%, Gr 1 DD, mild MR, mild LAE, PASP 32 mmHg  . Hypothyroidism     Patient Active Problem List   Diagnosis Date Noted  . Unilateral primary  osteoarthritis, right knee 01/12/2017  . Takotsubo syndrome 12/08/2014  . S/p reverse total shoulder arthroplasty   . Cardiomyopathy ([place])   . Iron deficiency anemia   . Aspiration pneumonia due to inhalation of milk ([place])   . Secondary cardiomyopathy ([place])   . Acute cystitis without hematuria   . Hypoxia   . Blood poisoning   . Aspiration pneumonia due to food (regurgitated) ([place])   . Chronic kidney disease, stage III (moderate) (HCC)   . UTI (lower urinary tract infection)   . Anemia, iron deficiency   . Acute blood loss anemia   . Acute encephalopathy   . Protein-calorie malnutrition, severe ([place]) 08/01/2014  . Acute systolic heart failure ([place])   . Respiratory failure ([place])   . NSTEMI (non-ST elevated myocardial infarction) ([place])   . Protein-calorie malnutrition ([place])   . Other specified hypothyroidism   . Acute pulmonary edema (HCC)   . Pleural effusion, left   . CKD (chronic kidney disease), stage III ([place]) 07/28/2014  . Metabolic encephalopathy 91/63/8466  . Acute delirium 07/28/2014  . Fever 07/28/2014  . Acute respiratory failure with hypoxia ([place]) 07/28/2014  . Metabolic acidosis 59/93/5701  . Sepsis ([place]) 07/28/2014  . Left rotator cuff tear arthropathy 07/27/2014  . History of iron deficiency anemia 04/14/2013  . Acute renal failure ([place]) 04/14/2013  . Primary open angle glaucoma  04/08/2012  . History of breast cancer 06/17/2011    Past Surgical History:  Procedure Laterality Date  . ABDOMINAL HYSTERECTOMY    . BACK SURGERY    . BILATERAL CARPAL TUNNEL RELEASE    . CHOLECYSTECTOMY    . EYE SURGERY Right   . KNEE SURGERY    . REVERSE SHOULDER ARTHROPLASTY Left 07/27/2014   Procedure: REVERSE SHOULDER ARTHROPLASTY;  Surgeon: Tania Ade, MD;  Location: [facility];  Service: Orthopedics;  Laterality: Left;  Left reverse total shoulder arthroplasty  . SHOULDER SURGERY Bilateral   . TONSILLECTOMY       OB History   No obstetric history on file.      Home  Medications    Prior to Admission medications   Medication Sig Start Date End Date Taking? Authorizing Provider  acetaminophen (TYLENOL) 500 MG tablet Take 500 mg by mouth every 6 (six) hours as needed for mild pain.   Yes [provider]  denosumab (PROLIA) 60 MG/ML SOSY injection Inject 60 mg into the skin every 6 (six) months.   Yes [provider]  diazepam (VALIUM) 5 MG tablet Take 5 mg by mouth at bedtime as needed for anxiety (sleep).  09/04/17  Yes [provider]  LUMIGAN 0.01 % SOLN Place 1 drop into both eyes at bedtime.  11/30/17  Yes [provider]  mirtazapine (REMERON) 45 MG tablet Take 45 mg by mouth at bedtime.  11/30/17  Yes [provider]  OLANZapine (ZYPREXA) 2.5 MG tablet Take 2.5 mg by mouth every evening. 09/04/17  Yes [provider]  traMADol (ULTRAM) 50 MG tablet Take 50 mg by mouth every 12 (twelve) hours as needed for moderate pain or severe pain.    Yes [provider]  aspirin EC 81 MG tablet Take 1 tablet (81 mg total) by mouth daily. Patient not taking: Reported on 10/13/2017 08/21/14   Liliane Shi, PA-C    Family History Family History  Problem Relation Age of Onset  . Brain cancer Mother   . Cancer - Lung Father     Social History Social History   Tobacco Use  . Smoking status: Never Smoker  . Smokeless tobacco: Never Used  Substance Use Topics  . Alcohol use: No  . Drug use: No     Allergies   Metoprolol tartrate; Brimonidine; Brinzolamide; Codeine; Dorzolamide hcl-timolol mal; Blue dyes (parenteral); Morphine and related; and Red dye   Review of Systems Review of Systems  Constitutional: Negative for fever.  HENT: Negative for sore throat.   Respiratory: Negative for shortness of breath.   Cardiovascular: Negative for chest pain.  Gastrointestinal: Negative for abdominal pain, nausea and vomiting.  Genitourinary: Negative for dysuria and flank pain.  Musculoskeletal:  Positive for back pain and myalgias.  Skin: Negative for pallor and wound.  Neurological: Negative for light-headedness and headaches.     Physical Exam Updated Vital Signs BP 115/62 (BP Location: Right Arm)   Pulse 71   Temp 97.9 F (36.6 C) (Oral)   Resp 16   Wt 35.8 kg   SpO2 99%   BMI 15.96 kg/m   Physical Exam Vitals signs and nursing note reviewed.  Constitutional:      Comments: cachetic   HENT:     Head: Normocephalic and atraumatic.     Mouth/Throat:     Mouth: Mucous membranes are dry.  Eyes:     Pupils: Pupils are equal, round, and reactive to light.  Cardiovascular:  Rate and Rhythm: Normal rate.     Pulses: Normal pulses.  Pulmonary:     Effort: Pulmonary effort is normal.     Breath sounds: No wheezing.  Abdominal:     General: Abdomen is flat.     Tenderness: There is no abdominal tenderness. There is no right CVA tenderness or left CVA tenderness.  Musculoskeletal:     Right lower leg: No edema.     Left lower leg: No edema.  Skin:    General: Skin is warm.     Findings: Wound present.       Neurological:     General: No focal deficit present.     Mental Status: She is alert and oriented to person, place, and time. Mental status is at baseline.      ED Treatments / Results  Labs (all labs ordered are listed, but only abnormal results are displayed) Labs Reviewed  CBC WITH DIFFERENTIAL/PLATELET - Abnormal; Notable for the following components:      Result Value   RBC 3.71 (*)    Hemoglobin 11.1 (*)    RDW 20.8 (*)    Lymphs Abs 0.6 (*)    All other components within normal limits  COMPREHENSIVE METABOLIC PANEL - Abnormal; Notable for the following components:   BUN 28 (*)    Calcium 8.5 (*)    GFR calc non Af Amer 57 (*)    All other components within normal limits  URINALYSIS, ROUTINE W REFLEX MICROSCOPIC - Abnormal; Notable for the following components:   Ketones, ur 5 (*)    Nitrite POSITIVE (*)    Leukocytes, UA LARGE (*)      Bacteria, UA MANY (*)    All other components within normal limits    EKG EKG Interpretation  Date/Time:  Friday December 18 2017 15:29:52 EST Ventricular Rate:  67 PR Interval:    QRS Duration: 99 QT Interval:  426 QTC Calculation: 450 R Axis:   -51 Text Interpretation:  Sinus rhythm Left anterior fascicular block Abnormal R-wave progression, late transition Left ventricular hypertrophy Minimal ST elevation, lateral leads When compared to prior, no significant changes seen.  No STEMI Confirmed by Antony Blackbird [phone]) on 12/18/2017 8:36:54 PM   Radiology Dg Chest 2 View  Result Date: 12/18/2017 CLINICAL DATA:  84 year old female with history of falls found down. EXAM: CHEST - 2 VIEW COMPARISON:  08/03/2014 and earlier. FINDINGS: Semi upright AP and lateral views of the chest. Chronic left shoulder arthroplasty. Partially visible lumbar spine fusion. No acute osseous abnormality identified. Moderate gastric hiatal hernia containing an air-fluid level. Other mediastinal contours are within normal limits. Calcified aortic atherosclerosis. No pneumothorax, pleural effusion, pulmonary edema or confluent pulmonary opacity. Stable cholecystectomy clips. Negative visible bowel gas pattern. IMPRESSION: 1. No acute cardiopulmonary abnormality or acute traumatic injury identified. 2. Moderate gastric hiatal hernia. 3. Aortic Atherosclerosis (ICD10-I70.0). Electronically Signed   By: Genevie Ann M.D.   On: 12/18/2017 15:07   Ct Head Wo Contrast  Result Date: 12/18/2017 CLINICAL DATA:  Unwitnessed fall. EXAM: CT HEAD WITHOUT CONTRAST CT CERVICAL SPINE WITHOUT CONTRAST TECHNIQUE: Multidetector CT imaging of the head and cervical spine was performed following the standard protocol without intravenous contrast. Multiplanar CT image reconstructions of the cervical spine were also generated. COMPARISON:  CT scan of December 10, 2017. FINDINGS: CT HEAD FINDINGS Brain: Mild diffuse cortical atrophy is noted.  Mild chronic ischemic white matter disease is noted. No mass effect or midline shift is noted. Ventricular size  is within normal limits. There is no evidence of mass lesion, hemorrhage or acute infarction. Vascular: No hyperdense vessel or unexpected calcification. Skull: Normal. Negative for fracture or focal lesion. Sinuses/Orbits: No acute finding. Other: Small right posterior parietal scalp hematoma is noted. CT CERVICAL SPINE FINDINGS Alignment: Normal. Skull base and vertebrae: No acute fracture. No primary bone lesion or focal pathologic process. Soft tissues and spinal canal: No prevertebral fluid or swelling. No visible canal hematoma. Disc levels: Moderate degenerative disc disease is noted at C4-5, C5-6 and C6-7. Upper chest: Negative. Other: Degenerative changes are seen involving posterior facet joints bilaterally. IMPRESSION: Mild diffuse cortical atrophy. Mild chronic ischemic white matter disease. Small right posterior scalp hematoma. No acute intracranial abnormality seen. Moderate multilevel degenerative disc disease. No acute abnormality seen in the cervical spine. Electronically Signed   By: Marijo Conception, M.D.   On: 12/18/2017 16:39   Ct Cervical Spine Wo Contrast  Result Date: 12/18/2017 CLINICAL DATA:  Unwitnessed fall. EXAM: CT HEAD WITHOUT CONTRAST CT CERVICAL SPINE WITHOUT CONTRAST TECHNIQUE: Multidetector CT imaging of the head and cervical spine was performed following the standard protocol without intravenous contrast. Multiplanar CT image reconstructions of the cervical spine were also generated. COMPARISON:  CT scan of December 10, 2017. FINDINGS: CT HEAD FINDINGS Brain: Mild diffuse cortical atrophy is noted. Mild chronic ischemic white matter disease is noted. No mass effect or midline shift is noted. Ventricular size is within normal limits. There is no evidence of mass lesion, hemorrhage or acute infarction. Vascular: No hyperdense vessel or unexpected calcification. Skull:  Normal. Negative for fracture or focal lesion. Sinuses/Orbits: No acute finding. Other: Small right posterior parietal scalp hematoma is noted. CT CERVICAL SPINE FINDINGS Alignment: Normal. Skull base and vertebrae: No acute fracture. No primary bone lesion or focal pathologic process. Soft tissues and spinal canal: No prevertebral fluid or swelling. No visible canal hematoma. Disc levels: Moderate degenerative disc disease is noted at C4-5, C5-6 and C6-7. Upper chest: Negative. Other: Degenerative changes are seen involving posterior facet joints bilaterally. IMPRESSION: Mild diffuse cortical atrophy. Mild chronic ischemic white matter disease. Small right posterior scalp hematoma. No acute intracranial abnormality seen. Moderate multilevel degenerative disc disease. No acute abnormality seen in the cervical spine. Electronically Signed   By: Marijo Conception, M.D.   On: 12/18/2017 16:39   Ct Pelvis Wo Contrast  Result Date: 12/18/2017 CLINICAL DATA:  Pelvic and rectal pain. Status post fall. Initial encounter. EXAM: CT PELVIS WITHOUT CONTRAST TECHNIQUE: Multidetector CT imaging of the pelvis was performed following the standard protocol without intravenous contrast. COMPARISON:  CT abdomen and pelvis 10/18/2012. FINDINGS: Urinary Tract:  Bladder diverticulum on the right is noted. Bowel: No acute abnormality is seen. Sigmoid diverticulosis is identified. Vascular/Lymphatic: Atherosclerosis noted no lymphadenopathy. Reproductive: No mass or other significant abnormality status post hysterectomy. Other:  None.  No fluid collection is identified. Musculoskeletal: Remote healed right pubic ramus fractures are seen. No acute fracture is identified. There is partial visualization of lower lumbar fusion. 0.6 cm facet mediated anterolisthesis L5 on S1 is chronic. Degenerative changes seen at the symphysis pubis. No worrisome bony lesion is identified. The hips are preserved. Imaged musculature appears intact.  IMPRESSION: No acute abnormality. Remote healed right pubic ramus fractures. Diverticulosis without diverticulitis. Atherosclerosis. Electronically Signed   By: Inge Rise M.D.   On: 12/18/2017 21:39    Procedures Procedures (including critical care time)  Medications Ordered in ED Medications  sodium chloride 0.9 % bolus 500 mL (  0 mLs Intravenous Stopped 12/18/17 1839)  traMADol (ULTRAM) tablet 50 mg (50 mg Oral Given 12/18/17 1942)     Initial Impression / Assessment and Plan / ED Course  I have reviewed the triage vital signs and the nursing notes.  Pertinent labs & imaging results that were available during my care of the patient were reviewed by me and considered in my medical decision making (see chart for details).   Presents for multiple falls in the past week.  She was seen by PCP yesterday who advised patient needed a case management order to obtain rehab as she has had decreased appetite along with getting weaker.  Patient reports some pain on her lower bottom along with tailbone.  She does have a prescription for tramadol which has been providing for her.  Evaluation patient is got slight erythema to her tailbone region, neurologically exam is intact.  She reports she does ambulate at home but feels like she is on balance and weak lately.  She had a fall yesterday where her son tried to lift her up and they he fell with her.  CBC showed a hemoglobin of 11.1, CMP showed no electrolyte abnormality, BUN 28, daughter reports she has given patient some tramadol for her pain today.  5:52 PM Spoke to Dr. Erlinda Hong who advised patient could benefit from social work call, along with case management. Will inform daughter of plan. Call placed for social work.   10:19 PM Multiple conversations were discussed with social work and case management, patient's daughter is offering to go out of pocket for placement.  Spoke to social worker Roderic Palau who advised patient could be placed tomorrow morning  but will need to spend the night overnight.  I have discussed this with my attending at this time patient will stay overnight.  She is medically clear.  CT pelvis without contrast obtained to rule out any fractures patient reports she is unable to ambulate.  CT pelvis showed: No acute abnormality.    Remote healed right pubic ramus fractures.    Diverticulosis without diverticulitis.      Final Clinical Impressions(s) / ED Diagnoses   Final diagnoses:  Fall    ED Discharge Orders    None       Janeece Fitting, Hershal Coria 12/18/17 2222    Milton Ferguson, MD 12/21/17 1006

## 2017-12-18 NOTE — ED Notes (Signed)
Patient transported to CT 

## 2017-12-18 NOTE — Progress Notes (Addendum)
CSW sent, with pt's daughter's verbal permission, pt's referrals out to SNF's in the [place].  [name] understands that a facility, even a facility that participates in the 3-day waiver, must document that the pt has an acute, rehab-able injury to possibly qualify for the 3-day inpatient stay waiver and for possible insurance authorization and that the pt's CT scans/imaging do not support this and that is why the pt will have to likely private pay.  Pt's daughters plan:  [name] will agree to pay for two weeks up front (30 days up front if required) to a preferred facility (see below) and if a preferred facility does not aceept the pt then to a facility that can accept the pt on Sat 12/14, even if it is not a preferred facility.  [name] understands and accepts and is willing for the pt to go to any facility then transfer to a preferred facility at a later date once a preferred bed opens up.  [name]'s/pt's preferred facilities:  1. [facility] 2. [facility] 3. [facility] 4. [facility]  These facilities will need to be contacted on 12/14 due to time constraints and if these facilities do not accept the pt on 12/14 then the pt will have to D/C to the first facility that can accept her. [name] voiced understanding and is accepting of this and is willing to private pay.  At the request of the pt's son (via pt's daughter) a referral will also be sent to [facility], although pt's daughter feels ALF is no longer enough care for the pt.  [facility] referral sent by CSW.  PT consult has been placed by the EPD, per RN CM.  CSW will continue to follow for D/C needs.  Alphonse Guild. Juliett Eastburn, LCSW, LCAS, CSI Clinical Social Worker Ph: [phone]

## 2017-12-19 DIAGNOSIS — M79605 Pain in left leg: Secondary | ICD-10-CM | POA: Diagnosis not present

## 2017-12-19 MED ORDER — TRAMADOL HCL 50 MG PO TABS: 50.0000 mg | ORAL_TABLET | Freq: Two times a day (BID) | ORAL | Status: DC | PRN

## 2017-12-19 MED ORDER — OLANZAPINE 2.5 MG PO TABS: 2.5000 mg | ORAL_TABLET | Freq: Every evening | ORAL | Status: DC

## 2017-12-19 NOTE — Progress Notes (Signed)
Patient has been accepted to [facility] for today 12/14. Patient, RN and EDP agreeable to discharge plan. CSW spoke with patient and patient's daughter at bedside regarding discharge plans, both are agreeable. Please call report to [phone]. Patient's daughter transporting patient to facility.   No further social work needs.   Ollen Barges, [place] Work Department  [employer]  [phone]

## 2017-12-19 NOTE — Progress Notes (Signed)
12/19/17  1554  Called report [phone]. Report given to Claris Che, Therapist, sports.

## 2017-12-19 NOTE — Evaluation (Signed)
Physical Therapy Evaluation Patient Details Name: Andrea Gilmore MRN: [id number] DOB: April 24, 1925 Today's Date: 12/19/2017   History of Present Illness  84 y.o female with a PMH of CKD, Cardiomyopathy, Breast CA ,TSA post op [facility] in2016, has bilateral foot drop,presents to the ED 12/18/17 brought in by daughter s/p fall, in ED 12/5 for fall.   Clinical Impression  The patient required extensive assistance to transfer to [facility] and back to bed with 1 assist.. Patient has premorbid bilateral footdrop so  bilateral AFO's placed on patient for transfer. Patient's urine with significant odor. No family present, patient relates multiple falls. Pt admitted with above diagnosis. Pt currently with functional limitations due to the deficits listed below (see PT Problem List). Pt will benefit from skilled PT to increase their independence and safety with mobility to allow discharge to the venue listed below.       Follow Up Recommendations SNF    Equipment Recommendations  None recommended by PT    Recommendations for Other Services       Precautions / Restrictions Precautions Precautions: Fall Precaution Comments: post head lac Required Braces or Orthoses: Other Brace Other Brace: bil. AFOs needed to stand      Mobility  Bed Mobility Overal bed mobility: Needs Assistance Bed Mobility: Supine to Sit;Sit to Supine     Supine to sit: Mod assist Sit to supine: Mod assist   General bed mobility comments: assist with legs and trunk  Transfers Overall transfer level: Needs assistance   Transfers: Sit to/from Stand;Stand Pivot Transfers Sit to Stand: Max assist Stand pivot transfers: Max assist       General transfer comment: with Bil. AFO's on, assisted to stand and pivot to [facility]  and back to bed, patient required extensive assistance to transfer.  Ambulation/Gait                Stairs            Wheelchair Mobility    Modified Rankin (Stroke Patients Only)        Balance Overall balance assessment: History of Falls;Needs assistance Sitting-balance support: Feet supported;Bilateral upper extremity supported Sitting balance-Leahy Scale: Fair     Standing balance support: During functional activity;Bilateral upper extremity supported Standing balance-Leahy Scale: Poor                               Pertinent Vitals/Pain Pain Assessment: Faces Faces Pain Scale: Hurts even more Pain Location: right knee, left hip Pain Descriptors / Indicators: Discomfort Pain Intervention(s): Monitored during session;Limited activity within patient's tolerance;Repositioned    Home Living Family/patient expects to be discharged to:: Private residence Living Arrangements: Children Available Help at Discharge: Family;Available PRN/intermittently Type of Home: Apartment Home Access: Ramped entrance     Home Layout: One level Home Equipment: Walker - 2 wheels;Wheelchair - [employer] - 4 wheels;Shower seat Additional Comments: childre work per patient    Prior Function Level of Independence: Needs assistance               Hand Dominance        Extremity/Trunk Assessment   Upper Extremity Assessment Upper Extremity Assessment: Generalized weakness;RUE deficits/detail;LUE deficits/detail RUE Deficits / Details: limited elevation LUE Deficits / Details: limited elevation    Lower Extremity Assessment Lower Extremity Assessment: Generalized weakness;RLE deficits/detail;LLE deficits/detail RLE Deficits / Details: foot drop LLE Deficits / Details: foot drop    Cervical / Trunk Assessment Cervical / Trunk Assessment:  Kyphotic  Communication   Communication: No difficulties  Cognition Arousal/Alertness: Awake/alert Behavior During Therapy: WFL for tasks assessed/performed Overall Cognitive Status: No family/caregiver present to determine baseline cognitive functioning Area of Impairment: Orientation                  Orientation Level: Place;Time             General Comments: stated that she lived with her dad.      General Comments      Exercises     Assessment/Plan    PT Assessment Patient needs continued PT services  PT Problem List Decreased strength;Decreased range of motion;Decreased cognition;Decreased activity tolerance;Decreased knowledge of use of DME;Decreased balance;Decreased mobility;Decreased safety awareness;Decreased knowledge of precautions;Decreased skin integrity;Impaired sensation       PT Treatment Interventions DME instruction;Gait training;Balance training;Functional mobility training;Therapeutic activities;Patient/family education    PT Goals (Current goals can be found in the Care Plan section)  Acute Rehab PT Goals Patient Stated Goal: agreed to go to [facility] PT Goal Formulation: Patient unable to participate in goal setting Time For Goal Achievement: 01/02/18 Potential to Achieve Goals: Fair    Frequency Min 2X/week   Barriers to discharge Decreased caregiver support      Co-evaluation               AM-PAC PT "6 Clicks" Mobility  Outcome Measure Help needed turning from your back to your side while in a flat bed without using bedrails?: Total Help needed moving from lying on your back to sitting on the side of a flat bed without using bedrails?: Total Help needed moving to and from a bed to a chair (including a wheelchair)?: Total Help needed standing up from a chair using your arms (e.g., wheelchair or bedside chair)?: Total Help needed to walk in hospital room?: Total Help needed climbing 3-5 steps with a railing? : Total 6 Click Score: 6    End of Session Equipment Utilized During Treatment: Gait belt Activity Tolerance: Patient limited by fatigue Patient left: in bed;with call bell/phone within reach;with bed alarm set Nurse Communication: Mobility status PT Visit Diagnosis: Unsteadiness on feet (R26.81);Pain Pain - Right/Left:  Right Pain - part of body: Knee    Time: 4917-9150 PT Time Calculation (min) (ACUTE ONLY): 45 min   Charges:   PT Evaluation $PT Eval Moderate Complexity: 1 Mod PT Treatments $Therapeutic Activity: 23-37 mins        Tresa Endo PT Acute Rehabilitation Services Pager [phone] Office [phone]   Claretha Cooper 12/19/2017, 9:33 AM

## 2017-12-19 NOTE — Clinical Social Work Note (Signed)
Clinical Social Work Assessment  Patient Details  Name: Andrea Gilmore MRN: [id number] Date of Birth: 03-03-1925  Date of referral:  12/19/17               Reason for consult:  Facility Placement                Permission sought to share information with:  Facility Art therapist granted to share information::  Yes, Verbal Permission Granted  Name::        Agency::     Relationship::     Contact Information:     Housing/Transportation Living arrangements for the past 2 months:    Source of Information:  Patient, Adult Children Patient Interpreter Needed:  None Criminal Activity/Legal Involvement Pertinent to Current Situation/Hospitalization:    Significant Relationships:  Adult Children Lives with:  Self Do you feel safe going back to the place where you live?  No Need for family participation in patient care:  Yes (Comment)  Care giving concerns:  CSW sent, with pt's daughter's verbal permission, pt's referrals out to SNF's in the [place].  [name] understands that a facility, even a facility that participates in the 3-day waiver, must document that the pt has an acute, rehab-able injury to possibly qualify for the 3-day inpatient stay waiver and for possible insurance authorization and that the pt's CT scans/imaging do not support this and that is why the pt will have to likely private pay.  Pt's daughters plan:  [name] will agree to pay for two weeks up front (30 days up front if required) to a preferred facility (see below) and if a preferred facility does not aceept the pt then to a facility that can accept the pt on Sat 12/14, even if it is not a preferred facility.  [name] understands and accepts and is willing for the pt to go to any facility then transfer to a preferred facility at a later date once a preferred bed opens up.  [name]'s/pt's preferred facilities:  1. [facility] 2. [facility] 3.  [facility] 4. [facility]  These facilities will need to be contacted on 12/14 due to time constraints and if these facilities do not accept the pt on 12/14 then the pt will have to D/C to the first facility that can accept her. [name] voiced understanding and is accepting of this and is willing to private pay.  At the request of the pt's son (via pt's daughter) a referral will also be sent to [facility], although pt's daughter feels ALF is no longer enough care for the pt.  [facility] referral sent by CSW.  PT consult has been placed by the EPD, per RN CM.   Social Worker assessment / plan:  CSW met with pt and pt's daughter and confirmed pt's plan to be discharged to SNF to live for LTC after initially private-paying for two weeks to see if pt recovers, at discharge.  CSW provided active listening and validated pt's daughter's concerns.   CSW will complete FL-2 and send referrals out to SNF facilities via the hub per pt's/pt's daughter's request.  Pt has been living independently prior to being admitted to [facility].  Employment status:  Retired Health visitor, Managed Care PT Recommendations:  Not assessed at this time Information / Referral to community resources:     Patient/Family's Response to care:  Patient alert and orientedX3, not fully oriented to situation.  Patient and  pt's daughter agreeable to plan.  [name] supportive and strongly involved in pt.'s care.  Pt.'s daughter pleasant and appreciated CSW intervention.    Patient/Family's Understanding of and Emotional Response to Diagnosis, Current Treatment, and Prognosis:  Still assessing   Emotional Assessment Appearance:  Appears stated age Attitude/Demeanor/Rapport:    Affect (typically observed):  Flat Orientation:  Oriented to Place, Oriented to  Time, Oriented to Self Alcohol / Substance use:    Psych involvement (Current and /or in the community):     Discharge Needs  Concerns  to be addressed:  No discharge needs identified Readmission within the last 30 days:  No Current discharge risk:  None(Pt cannot move, extreme pain) Barriers to Discharge:  No Barriers Identified   Claudine Mouton, LCSWA 12/19/2017, 1:59 AM

## 2017-12-19 NOTE — Progress Notes (Addendum)
CSW received handoff from 2nd shift CSW. Per notes, patient has been having increased falls at home. Per patient's daughter, they are willing to pay privately if patient does not qualify for 3-day waiver. CSW aware PT is recommending SNF. CSW to follow up with the preferred facilities family listed to see if they have open beds. CSW will continue to follow.  10:23am- CSW has attempted to reach out to the four preferred facilities that patient's daughter had identified. CSW spoke with Narda Rutherford at [facility] who reports there are no open beds today but there may be one tomorrow. CSW spoke with April at [facility] who stated she was not sure if she would have a bed available but stated she would follow up with CSW once she knows. CSW left voicemails for [facility] and [facility] SNF regarding open beds. CSW awaiting return calls.   CSW also received phone call from patient's daughter who stated she was going to follow up with [facility] today to see if they could accept patient. Per patient's daughter, she would follow up with CSW after.  12:33pm- CSW received phone call from patient's daughter, Karna Dupes. Per Jeani Hawking, she is currently at [facility] and they may be able to accept patient. Per Jeani Hawking, she will give [facility] CSW's number to reach out. CSW awaiting call.   1:58pm- CSW spoke with Claudette Head at [facility] who expressed concern with accepting patient after CSW explained patient was being recommended for SNF. CSW spoke with Irine Seal of [facility] SN[facility] who reports they have a semi-private bed they could offer patient. CSW spoke with patient and patient's daughter at bedside and they are agreeable to go to [facility]. CSW attempted to follow up with [facility] and left voicemail for return call.  2:38pm- CSW informed that [facility] only takes checks. Per patient's daughter, she only has a card and would not be able to get a check. CSW reached out to Tammy with [facility] who  states they have a bed available and can take card. CSW working out details with Tammy now. CSW will continue to follow.  3:16pm- CSW informed [facility] does not have a bed available at this time. Tammy currently checking with [facility].   Ollen Barges, [place] Work Department  [employer]  [phone]

## 2017-12-21 DIAGNOSIS — Z741 Need for assistance with personal care: Secondary | ICD-10-CM | POA: Diagnosis not present

## 2017-12-21 DIAGNOSIS — E1140 Type 2 diabetes mellitus with diabetic neuropathy, unspecified: Secondary | ICD-10-CM | POA: Diagnosis not present

## 2017-12-21 DIAGNOSIS — R262 Difficulty in walking, not elsewhere classified: Secondary | ICD-10-CM | POA: Diagnosis not present

## 2017-12-21 DIAGNOSIS — R1312 Dysphagia, oropharyngeal phase: Secondary | ICD-10-CM | POA: Diagnosis not present

## 2017-12-21 DIAGNOSIS — G309 Alzheimer's disease, unspecified: Secondary | ICD-10-CM | POA: Diagnosis not present

## 2017-12-21 DIAGNOSIS — M6281 Muscle weakness (generalized): Secondary | ICD-10-CM | POA: Diagnosis not present

## 2017-12-22 DIAGNOSIS — G309 Alzheimer's disease, unspecified: Secondary | ICD-10-CM | POA: Diagnosis not present

## 2017-12-22 DIAGNOSIS — R1312 Dysphagia, oropharyngeal phase: Secondary | ICD-10-CM | POA: Diagnosis not present

## 2017-12-22 DIAGNOSIS — E1140 Type 2 diabetes mellitus with diabetic neuropathy, unspecified: Secondary | ICD-10-CM | POA: Diagnosis not present

## 2017-12-22 DIAGNOSIS — M6281 Muscle weakness (generalized): Secondary | ICD-10-CM | POA: Diagnosis not present

## 2017-12-22 DIAGNOSIS — R262 Difficulty in walking, not elsewhere classified: Secondary | ICD-10-CM | POA: Diagnosis not present

## 2017-12-22 DIAGNOSIS — Z741 Need for assistance with personal care: Secondary | ICD-10-CM | POA: Diagnosis not present

## 2017-12-23 DIAGNOSIS — M6281 Muscle weakness (generalized): Secondary | ICD-10-CM | POA: Diagnosis not present

## 2017-12-23 DIAGNOSIS — E039 Hypothyroidism, unspecified: Secondary | ICD-10-CM | POA: Diagnosis not present

## 2017-12-23 DIAGNOSIS — E785 Hyperlipidemia, unspecified: Secondary | ICD-10-CM | POA: Diagnosis not present

## 2017-12-23 DIAGNOSIS — E1140 Type 2 diabetes mellitus with diabetic neuropathy, unspecified: Secondary | ICD-10-CM | POA: Diagnosis not present

## 2017-12-23 DIAGNOSIS — E559 Vitamin D deficiency, unspecified: Secondary | ICD-10-CM | POA: Diagnosis not present

## 2017-12-23 DIAGNOSIS — R262 Difficulty in walking, not elsewhere classified: Secondary | ICD-10-CM | POA: Diagnosis not present

## 2017-12-23 DIAGNOSIS — Z741 Need for assistance with personal care: Secondary | ICD-10-CM | POA: Diagnosis not present

## 2017-12-23 DIAGNOSIS — E119 Type 2 diabetes mellitus without complications: Secondary | ICD-10-CM | POA: Diagnosis not present

## 2017-12-23 DIAGNOSIS — R1312 Dysphagia, oropharyngeal phase: Secondary | ICD-10-CM | POA: Diagnosis not present

## 2017-12-23 DIAGNOSIS — G309 Alzheimer's disease, unspecified: Secondary | ICD-10-CM | POA: Diagnosis not present

## 2017-12-23 DIAGNOSIS — D649 Anemia, unspecified: Secondary | ICD-10-CM | POA: Diagnosis not present

## 2017-12-24 DIAGNOSIS — E1140 Type 2 diabetes mellitus with diabetic neuropathy, unspecified: Secondary | ICD-10-CM | POA: Diagnosis not present

## 2017-12-24 DIAGNOSIS — E43 Unspecified severe protein-calorie malnutrition: Secondary | ICD-10-CM | POA: Diagnosis not present

## 2017-12-24 DIAGNOSIS — M170 Bilateral primary osteoarthritis of knee: Secondary | ICD-10-CM | POA: Diagnosis not present

## 2017-12-24 DIAGNOSIS — Z741 Need for assistance with personal care: Secondary | ICD-10-CM | POA: Diagnosis not present

## 2017-12-24 DIAGNOSIS — M6281 Muscle weakness (generalized): Secondary | ICD-10-CM | POA: Diagnosis not present

## 2017-12-24 DIAGNOSIS — R1312 Dysphagia, oropharyngeal phase: Secondary | ICD-10-CM | POA: Diagnosis not present

## 2017-12-24 DIAGNOSIS — G309 Alzheimer's disease, unspecified: Secondary | ICD-10-CM | POA: Diagnosis not present

## 2017-12-24 DIAGNOSIS — R296 Repeated falls: Secondary | ICD-10-CM | POA: Diagnosis not present

## 2017-12-24 DIAGNOSIS — R262 Difficulty in walking, not elsewhere classified: Secondary | ICD-10-CM | POA: Diagnosis not present

## 2017-12-24 DIAGNOSIS — R5381 Other malaise: Secondary | ICD-10-CM | POA: Diagnosis not present

## 2017-12-25 DIAGNOSIS — G309 Alzheimer's disease, unspecified: Secondary | ICD-10-CM | POA: Diagnosis not present

## 2017-12-25 DIAGNOSIS — Z741 Need for assistance with personal care: Secondary | ICD-10-CM | POA: Diagnosis not present

## 2017-12-25 DIAGNOSIS — R262 Difficulty in walking, not elsewhere classified: Secondary | ICD-10-CM | POA: Diagnosis not present

## 2017-12-25 DIAGNOSIS — M6281 Muscle weakness (generalized): Secondary | ICD-10-CM | POA: Diagnosis not present

## 2017-12-25 DIAGNOSIS — E1140 Type 2 diabetes mellitus with diabetic neuropathy, unspecified: Secondary | ICD-10-CM | POA: Diagnosis not present

## 2017-12-25 DIAGNOSIS — R1312 Dysphagia, oropharyngeal phase: Secondary | ICD-10-CM | POA: Diagnosis not present

## 2017-12-28 DIAGNOSIS — Z741 Need for assistance with personal care: Secondary | ICD-10-CM | POA: Diagnosis not present

## 2017-12-28 DIAGNOSIS — E119 Type 2 diabetes mellitus without complications: Secondary | ICD-10-CM | POA: Diagnosis not present

## 2017-12-28 DIAGNOSIS — R5381 Other malaise: Secondary | ICD-10-CM | POA: Diagnosis not present

## 2017-12-28 DIAGNOSIS — R1312 Dysphagia, oropharyngeal phase: Secondary | ICD-10-CM | POA: Diagnosis not present

## 2017-12-28 DIAGNOSIS — E1140 Type 2 diabetes mellitus with diabetic neuropathy, unspecified: Secondary | ICD-10-CM | POA: Diagnosis not present

## 2017-12-28 DIAGNOSIS — R262 Difficulty in walking, not elsewhere classified: Secondary | ICD-10-CM | POA: Diagnosis not present

## 2017-12-28 DIAGNOSIS — M6281 Muscle weakness (generalized): Secondary | ICD-10-CM | POA: Diagnosis not present

## 2017-12-28 DIAGNOSIS — R296 Repeated falls: Secondary | ICD-10-CM | POA: Diagnosis not present

## 2017-12-28 DIAGNOSIS — G309 Alzheimer's disease, unspecified: Secondary | ICD-10-CM | POA: Diagnosis not present

## 2017-12-28 DIAGNOSIS — E43 Unspecified severe protein-calorie malnutrition: Secondary | ICD-10-CM | POA: Diagnosis not present

## 2017-12-28 DIAGNOSIS — E039 Hypothyroidism, unspecified: Secondary | ICD-10-CM | POA: Diagnosis not present

## 2017-12-28 DIAGNOSIS — M170 Bilateral primary osteoarthritis of knee: Secondary | ICD-10-CM | POA: Diagnosis not present

## 2017-12-29 DIAGNOSIS — R1312 Dysphagia, oropharyngeal phase: Secondary | ICD-10-CM | POA: Diagnosis not present

## 2017-12-29 DIAGNOSIS — E1140 Type 2 diabetes mellitus with diabetic neuropathy, unspecified: Secondary | ICD-10-CM | POA: Diagnosis not present

## 2017-12-29 DIAGNOSIS — Z741 Need for assistance with personal care: Secondary | ICD-10-CM | POA: Diagnosis not present

## 2017-12-29 DIAGNOSIS — R262 Difficulty in walking, not elsewhere classified: Secondary | ICD-10-CM | POA: Diagnosis not present

## 2017-12-29 DIAGNOSIS — M6281 Muscle weakness (generalized): Secondary | ICD-10-CM | POA: Diagnosis not present

## 2017-12-29 DIAGNOSIS — G309 Alzheimer's disease, unspecified: Secondary | ICD-10-CM | POA: Diagnosis not present

## 2017-12-30 DIAGNOSIS — E1140 Type 2 diabetes mellitus with diabetic neuropathy, unspecified: Secondary | ICD-10-CM | POA: Diagnosis not present

## 2017-12-30 DIAGNOSIS — Z741 Need for assistance with personal care: Secondary | ICD-10-CM | POA: Diagnosis not present

## 2017-12-30 DIAGNOSIS — R262 Difficulty in walking, not elsewhere classified: Secondary | ICD-10-CM | POA: Diagnosis not present

## 2017-12-30 DIAGNOSIS — G309 Alzheimer's disease, unspecified: Secondary | ICD-10-CM | POA: Diagnosis not present

## 2017-12-30 DIAGNOSIS — R1312 Dysphagia, oropharyngeal phase: Secondary | ICD-10-CM | POA: Diagnosis not present

## 2017-12-30 DIAGNOSIS — M6281 Muscle weakness (generalized): Secondary | ICD-10-CM | POA: Diagnosis not present

## 2017-12-31 DIAGNOSIS — G309 Alzheimer's disease, unspecified: Secondary | ICD-10-CM | POA: Diagnosis not present

## 2017-12-31 DIAGNOSIS — Z741 Need for assistance with personal care: Secondary | ICD-10-CM | POA: Diagnosis not present

## 2017-12-31 DIAGNOSIS — R262 Difficulty in walking, not elsewhere classified: Secondary | ICD-10-CM | POA: Diagnosis not present

## 2017-12-31 DIAGNOSIS — R1312 Dysphagia, oropharyngeal phase: Secondary | ICD-10-CM | POA: Diagnosis not present

## 2017-12-31 DIAGNOSIS — E1140 Type 2 diabetes mellitus with diabetic neuropathy, unspecified: Secondary | ICD-10-CM | POA: Diagnosis not present

## 2017-12-31 DIAGNOSIS — M6281 Muscle weakness (generalized): Secondary | ICD-10-CM | POA: Diagnosis not present

## 2018-01-01 DIAGNOSIS — R262 Difficulty in walking, not elsewhere classified: Secondary | ICD-10-CM | POA: Diagnosis not present

## 2018-01-01 DIAGNOSIS — M6281 Muscle weakness (generalized): Secondary | ICD-10-CM | POA: Diagnosis not present

## 2018-01-01 DIAGNOSIS — R1312 Dysphagia, oropharyngeal phase: Secondary | ICD-10-CM | POA: Diagnosis not present

## 2018-01-01 DIAGNOSIS — G309 Alzheimer's disease, unspecified: Secondary | ICD-10-CM | POA: Diagnosis not present

## 2018-01-01 DIAGNOSIS — E1140 Type 2 diabetes mellitus with diabetic neuropathy, unspecified: Secondary | ICD-10-CM | POA: Diagnosis not present

## 2018-01-01 DIAGNOSIS — Z741 Need for assistance with personal care: Secondary | ICD-10-CM | POA: Diagnosis not present

## 2018-01-03 DIAGNOSIS — R1312 Dysphagia, oropharyngeal phase: Secondary | ICD-10-CM | POA: Diagnosis not present

## 2018-01-03 DIAGNOSIS — M6281 Muscle weakness (generalized): Secondary | ICD-10-CM | POA: Diagnosis not present

## 2018-01-03 DIAGNOSIS — Z741 Need for assistance with personal care: Secondary | ICD-10-CM | POA: Diagnosis not present

## 2018-01-03 DIAGNOSIS — G309 Alzheimer's disease, unspecified: Secondary | ICD-10-CM | POA: Diagnosis not present

## 2018-01-03 DIAGNOSIS — E1140 Type 2 diabetes mellitus with diabetic neuropathy, unspecified: Secondary | ICD-10-CM | POA: Diagnosis not present

## 2018-01-03 DIAGNOSIS — R262 Difficulty in walking, not elsewhere classified: Secondary | ICD-10-CM | POA: Diagnosis not present

## 2018-01-04 DIAGNOSIS — E43 Unspecified severe protein-calorie malnutrition: Secondary | ICD-10-CM | POA: Diagnosis not present

## 2018-01-04 DIAGNOSIS — Z741 Need for assistance with personal care: Secondary | ICD-10-CM | POA: Diagnosis not present

## 2018-01-04 DIAGNOSIS — E119 Type 2 diabetes mellitus without complications: Secondary | ICD-10-CM | POA: Diagnosis not present

## 2018-01-04 DIAGNOSIS — M6281 Muscle weakness (generalized): Secondary | ICD-10-CM | POA: Diagnosis not present

## 2018-01-04 DIAGNOSIS — R262 Difficulty in walking, not elsewhere classified: Secondary | ICD-10-CM | POA: Diagnosis not present

## 2018-01-04 DIAGNOSIS — R296 Repeated falls: Secondary | ICD-10-CM | POA: Diagnosis not present

## 2018-01-04 DIAGNOSIS — R1312 Dysphagia, oropharyngeal phase: Secondary | ICD-10-CM | POA: Diagnosis not present

## 2018-01-04 DIAGNOSIS — G309 Alzheimer's disease, unspecified: Secondary | ICD-10-CM | POA: Diagnosis not present

## 2018-01-04 DIAGNOSIS — I429 Cardiomyopathy, unspecified: Secondary | ICD-10-CM | POA: Diagnosis not present

## 2018-01-04 DIAGNOSIS — H401130 Primary open-angle glaucoma, bilateral, stage unspecified: Secondary | ICD-10-CM | POA: Diagnosis not present

## 2018-01-04 DIAGNOSIS — E1140 Type 2 diabetes mellitus with diabetic neuropathy, unspecified: Secondary | ICD-10-CM | POA: Diagnosis not present

## 2018-01-04 DIAGNOSIS — R319 Hematuria, unspecified: Secondary | ICD-10-CM | POA: Diagnosis not present

## 2018-01-05 ENCOUNTER — Encounter (HOSPITAL_COMMUNITY): Payer: Self-pay | Admitting: Emergency Medicine

## 2018-01-05 ENCOUNTER — Emergency Department (HOSPITAL_COMMUNITY)
Admission: EM | Admit: 2018-01-05 | Discharge: 2018-01-05 | Disposition: A | Discharge status: Home / Self Care | Payer: Medicare Other | Attending: Emergency Medicine | Admitting: Emergency Medicine

## 2018-01-05 DIAGNOSIS — S80211A Abrasion, right knee, initial encounter: Secondary | ICD-10-CM | POA: Diagnosis not present

## 2018-01-05 DIAGNOSIS — Y9389 Activity, other specified: Secondary | ICD-10-CM | POA: Diagnosis not present

## 2018-01-05 DIAGNOSIS — Y999 Unspecified external cause status: Secondary | ICD-10-CM | POA: Diagnosis not present

## 2018-01-05 DIAGNOSIS — Z853 Personal history of malignant neoplasm of breast: Secondary | ICD-10-CM | POA: Insufficient documentation

## 2018-01-05 DIAGNOSIS — R0902 Hypoxemia: Secondary | ICD-10-CM | POA: Diagnosis not present

## 2018-01-05 DIAGNOSIS — G309 Alzheimer's disease, unspecified: Secondary | ICD-10-CM | POA: Diagnosis not present

## 2018-01-05 DIAGNOSIS — I5022 Chronic systolic (congestive) heart failure: Secondary | ICD-10-CM | POA: Insufficient documentation

## 2018-01-05 DIAGNOSIS — R262 Difficulty in walking, not elsewhere classified: Secondary | ICD-10-CM | POA: Diagnosis not present

## 2018-01-05 DIAGNOSIS — S80212A Abrasion, left knee, initial encounter: Secondary | ICD-10-CM | POA: Diagnosis not present

## 2018-01-05 DIAGNOSIS — E039 Hypothyroidism, unspecified: Secondary | ICD-10-CM | POA: Insufficient documentation

## 2018-01-05 DIAGNOSIS — I130 Hypertensive heart and chronic kidney disease with heart failure and stage 1 through stage 4 chronic kidney disease, or unspecified chronic kidney disease: Secondary | ICD-10-CM | POA: Diagnosis not present

## 2018-01-05 DIAGNOSIS — R404 Transient alteration of awareness: Secondary | ICD-10-CM | POA: Diagnosis not present

## 2018-01-05 DIAGNOSIS — S8992XA Unspecified injury of left lower leg, initial encounter: Secondary | ICD-10-CM | POA: Diagnosis present

## 2018-01-05 DIAGNOSIS — Y92129 Unspecified place in nursing home as the place of occurrence of the external cause: Secondary | ICD-10-CM | POA: Diagnosis not present

## 2018-01-05 DIAGNOSIS — F0390 Unspecified dementia without behavioral disturbance: Secondary | ICD-10-CM | POA: Diagnosis not present

## 2018-01-05 DIAGNOSIS — R1312 Dysphagia, oropharyngeal phase: Secondary | ICD-10-CM | POA: Diagnosis not present

## 2018-01-05 DIAGNOSIS — W050XXA Fall from non-moving wheelchair, initial encounter: Secondary | ICD-10-CM

## 2018-01-05 DIAGNOSIS — M6281 Muscle weakness (generalized): Secondary | ICD-10-CM | POA: Diagnosis not present

## 2018-01-05 DIAGNOSIS — W19XXXA Unspecified fall, initial encounter: Secondary | ICD-10-CM | POA: Diagnosis not present

## 2018-01-05 DIAGNOSIS — Z741 Need for assistance with personal care: Secondary | ICD-10-CM | POA: Diagnosis not present

## 2018-01-05 DIAGNOSIS — N189 Chronic kidney disease, unspecified: Secondary | ICD-10-CM | POA: Diagnosis not present

## 2018-01-05 DIAGNOSIS — R451 Restlessness and agitation: Secondary | ICD-10-CM

## 2018-01-05 DIAGNOSIS — E1140 Type 2 diabetes mellitus with diabetic neuropathy, unspecified: Secondary | ICD-10-CM | POA: Diagnosis not present

## 2018-01-05 HISTORY — DX: Unspecified dementia, unspecified severity, without behavioral disturbance, psychotic disturbance, mood disturbance, and anxiety: F03.90

## 2018-01-05 MED ORDER — CEPHALEXIN 500 MG PO CAPS
500.0000 mg | ORAL_CAPSULE | Freq: Once | ORAL | Status: AC
  Administered 2018-01-05: 500 mg via ORAL
  Filled 2018-01-05: qty 1

## 2018-01-05 MED ORDER — CEPHALEXIN 500 MG PO CAPS: 500.0000 mg | ORAL_CAPSULE | Freq: Two times a day (BID) | ORAL | 0 refills | Status: DC

## 2018-01-05 NOTE — ED Provider Notes (Addendum)
[place] DEPT Provider Note: [name], MD, FACEP  CSN: [id number] MRN: [id number] ARRIVAL: 01/05/18 at [place]  Fall  Level 5 caveat: Dementia  HISTORY OF PRESENT ILLNESS  01/05/18 6:10 AM Andrea Gilmore is a 84 y.o. female with dementia.  She has had worsening agitation the past 2 days. She was placed in a wheelchair and observation of nursing staff this morning at her facility.  She reportedly either fell or jumped out of the wheelchair onto the floor.  EMS reports no obvious injuries apart from an abrasion to the left knee.  She is confused and requesting that she be returned home.  She is unhappy about being brought to this facility.    Past Medical History:  Diagnosis Date  . Arthritis   . Breast cancer ([place])    breast cancer  . Cardiomyopathy ([place])    a. possibly ischemic >> NSTEMI after should surgery in 07/2014 >>Echo 07/29/14:  EF 65%;  EF 08/01/14:  EF 20-25%, ant-septal AK, Gr 2 DD, mod MR, mod LAE, mod TR, PASP 63 mmHg  . Chronic systolic CHF (congestive heart failure) ([place])   . CKD (chronic kidney disease)   . Dementia ([place])   . History of echocardiogram    Echo 9/16:  Vigorous LVF, EF 65-70%, Gr 1 DD, mild MR, mild LAE, PASP 32 mmHg  . Hypothyroidism     Past Surgical History:  Procedure Laterality Date  . ABDOMINAL HYSTERECTOMY    . BACK SURGERY    . BILATERAL CARPAL TUNNEL RELEASE    . CHOLECYSTECTOMY    . EYE SURGERY Right   . KNEE SURGERY    . REVERSE SHOULDER ARTHROPLASTY Left 07/27/2014   Procedure: REVERSE SHOULDER ARTHROPLASTY;  Surgeon: Tania Ade, MD;  Location: [place];  Service: Orthopedics;  Laterality: Left;  Left reverse total shoulder arthroplasty  . SHOULDER SURGERY Bilateral   . TONSILLECTOMY      Family History  Problem Relation Age of Onset  . Brain cancer Mother   . Cancer - Lung Father     Social History   Tobacco Use  . Smoking status: Never Smoker  . Smokeless tobacco: Never  Used  Substance Use Topics  . Alcohol use: No  . Drug use: No    Prior to Admission medications   Medication Sig Start Date End Date Taking? Authorizing Provider  acetaminophen (TYLENOL) 500 MG tablet Take 500 mg by mouth every 6 (six) hours as needed for mild pain.    [provider]  cephALEXin (KEFLEX) 500 MG capsule Take 1 capsule (500 mg total) by mouth 2 (two) times daily. 01/05/18   Nicklaus Alviar, MD  denosumab (PROLIA) 60 MG/ML SOSY injection Inject 60 mg into the skin every 6 (six) months.    [provider]  diazepam (VALIUM) 5 MG tablet Take 5 mg by mouth at bedtime as needed for anxiety (sleep).  09/04/17   [provider]  LUMIGAN 0.01 % SOLN Place 1 drop into both eyes at bedtime.  11/30/17   [provider]  mirtazapine (REMERON) 45 MG tablet Take 45 mg by mouth at bedtime.  11/30/17   [provider]  OLANZapine (ZYPREXA) 2.5 MG tablet Take 2.5 mg by mouth every evening. 09/04/17   [provider]  traMADol (ULTRAM) 50 MG tablet Take 50 mg by mouth every 12 (twelve) hours as needed for moderate pain or severe pain.     [provider]  Allergies Metoprolol tartrate; Brimonidine; Brinzolamide; Codeine; Dorzolamide hcl-timolol mal; Blue dyes (parenteral); Morphine and related; and Red dye   REVIEW OF SYSTEMS     PHYSICAL EXAMINATION  Initial Vital Signs Blood pressure (!) 151/81, pulse 79, temperature 98.1 F (36.7 C), temperature source Oral, resp. rate 15, SpO2 99 %.  Examination General: Well-developed, cachectic female in no acute distress; appearance consistent with age of record HENT: normocephalic; subacute, well-healing ecchymosis to left cheek Eyes: pupils equal, round and reactive to light; extraocular muscles grossly intact Neck: supple; nontender Heart: regular rate and rhythm Lungs: clear to auscultation bilaterally Abdomen: soft; nondistended; nontender; bowel sounds present Extremities:  Arthritic changes; no pain on passive range of motion; pulses normal Neurologic: Awake, alert and oriented x 1; motor function intact in all extremities and symmetric; no facial droop Skin: Warm and dry; abrasions to knees, left greater than right, without signs of infection Psychiatric: Confused; mildly argumentative and combative   RESULTS  Summary of this visit's results, reviewed by myself:   EKG Interpretation  Date/Time:    Ventricular Rate:    PR Interval:    QRS Duration:   QT Interval:    QTC Calculation:   R Axis:     Text Interpretation:        Laboratory Studies: No results found for this or any previous visit (from the past 24 hour(s)). Imaging Studies: No results found.  ED COURSE and MDM  Nursing notes and initial vitals signs, including pulse oximetry, reviewed.  Vitals:   01/05/18 0542 01/05/18 0553  BP:  (!) 151/81  Pulse:  79  Resp:  15  Temp:  98.1 F (36.7 C)  TempSrc:  Oral  SpO2: 96% 99%   I do not see any indication for radiographic studies at this time.  On her previous visit on the 13th of this month her urinalysis was consistent with a urinary tract infection and this was not treated.  Her daughter states her facility staff was concerned she may have a urinary tract infection.  We will treat presumptively as this may be contributing to her behavioral changes recently.  PROCEDURES    ED DIAGNOSES     ICD-10-CM   1. Fall from wheelchair, initial encounter W05.0XXA   2. Agitation R45.1   3. Abrasion, knee, left, initial encounter S80.212A   4. Abrasion, knee, right, initial encounter A45.364W        Shanon Rosser, MD 01/05/18 8032    Shanon Rosser, MD 01/05/18 [phone]

## 2018-01-05 NOTE — ED Notes (Signed)
Bed: [facility] Expected date:  Expected time:  Means of arrival:  Comments: EMS 84 yo fall earlier-knee pain

## 2018-01-05 NOTE — ED Triage Notes (Signed)
Pt comes to ed via [facility], pt was in a wheel chair at facility at nursing station.  Pt had a fall of wheel chair and hit her head.  [place] is new address [place] [address] V/s 141/79, spo2 98 on room air, hr 86, rr16, cbg 101.  Pt verbalizes not wanting to be here.  Pt altered per baseline, staff says she is not acting the same as usual.   No new injuries noted by ems.  Pt has old hand wound on right hand, and abrasions on knees.

## 2018-01-05 NOTE — ED Notes (Signed)
Pt changed and wheeled to front and put in car

## 2018-01-06 DIAGNOSIS — Z741 Need for assistance with personal care: Secondary | ICD-10-CM | POA: Diagnosis not present

## 2018-01-06 DIAGNOSIS — R262 Difficulty in walking, not elsewhere classified: Secondary | ICD-10-CM | POA: Diagnosis not present

## 2018-01-06 DIAGNOSIS — E1140 Type 2 diabetes mellitus with diabetic neuropathy, unspecified: Secondary | ICD-10-CM | POA: Diagnosis not present

## 2018-01-06 DIAGNOSIS — M6281 Muscle weakness (generalized): Secondary | ICD-10-CM | POA: Diagnosis not present

## 2018-01-07 DIAGNOSIS — R262 Difficulty in walking, not elsewhere classified: Secondary | ICD-10-CM | POA: Diagnosis not present

## 2018-01-07 DIAGNOSIS — Z741 Need for assistance with personal care: Secondary | ICD-10-CM | POA: Diagnosis not present

## 2018-01-07 DIAGNOSIS — E1140 Type 2 diabetes mellitus with diabetic neuropathy, unspecified: Secondary | ICD-10-CM | POA: Diagnosis not present

## 2018-01-07 DIAGNOSIS — M6281 Muscle weakness (generalized): Secondary | ICD-10-CM | POA: Diagnosis not present

## 2018-01-08 DIAGNOSIS — R296 Repeated falls: Secondary | ICD-10-CM | POA: Diagnosis not present

## 2018-01-08 DIAGNOSIS — M6281 Muscle weakness (generalized): Secondary | ICD-10-CM | POA: Diagnosis not present

## 2018-01-08 DIAGNOSIS — E43 Unspecified severe protein-calorie malnutrition: Secondary | ICD-10-CM | POA: Diagnosis not present

## 2018-01-08 DIAGNOSIS — Z741 Need for assistance with personal care: Secondary | ICD-10-CM | POA: Diagnosis not present

## 2018-01-08 DIAGNOSIS — I429 Cardiomyopathy, unspecified: Secondary | ICD-10-CM | POA: Diagnosis not present

## 2018-01-08 DIAGNOSIS — R262 Difficulty in walking, not elsewhere classified: Secondary | ICD-10-CM | POA: Diagnosis not present

## 2018-01-08 DIAGNOSIS — M170 Bilateral primary osteoarthritis of knee: Secondary | ICD-10-CM | POA: Diagnosis not present

## 2018-01-08 DIAGNOSIS — E1140 Type 2 diabetes mellitus with diabetic neuropathy, unspecified: Secondary | ICD-10-CM | POA: Diagnosis not present

## 2018-01-10 DIAGNOSIS — E1140 Type 2 diabetes mellitus with diabetic neuropathy, unspecified: Secondary | ICD-10-CM | POA: Diagnosis not present

## 2018-01-10 DIAGNOSIS — M6281 Muscle weakness (generalized): Secondary | ICD-10-CM | POA: Diagnosis not present

## 2018-01-10 DIAGNOSIS — R262 Difficulty in walking, not elsewhere classified: Secondary | ICD-10-CM | POA: Diagnosis not present

## 2018-01-10 DIAGNOSIS — Z741 Need for assistance with personal care: Secondary | ICD-10-CM | POA: Diagnosis not present

## 2018-01-11 DIAGNOSIS — E1140 Type 2 diabetes mellitus with diabetic neuropathy, unspecified: Secondary | ICD-10-CM | POA: Diagnosis not present

## 2018-01-11 DIAGNOSIS — Z741 Need for assistance with personal care: Secondary | ICD-10-CM | POA: Diagnosis not present

## 2018-01-11 DIAGNOSIS — R262 Difficulty in walking, not elsewhere classified: Secondary | ICD-10-CM | POA: Diagnosis not present

## 2018-01-11 DIAGNOSIS — M6281 Muscle weakness (generalized): Secondary | ICD-10-CM | POA: Diagnosis not present

## 2018-01-12 DIAGNOSIS — M6281 Muscle weakness (generalized): Secondary | ICD-10-CM | POA: Diagnosis not present

## 2018-01-12 DIAGNOSIS — Z741 Need for assistance with personal care: Secondary | ICD-10-CM | POA: Diagnosis not present

## 2018-01-12 DIAGNOSIS — R262 Difficulty in walking, not elsewhere classified: Secondary | ICD-10-CM | POA: Diagnosis not present

## 2018-01-12 DIAGNOSIS — E1140 Type 2 diabetes mellitus with diabetic neuropathy, unspecified: Secondary | ICD-10-CM | POA: Diagnosis not present

## 2018-01-13 DIAGNOSIS — R262 Difficulty in walking, not elsewhere classified: Secondary | ICD-10-CM | POA: Diagnosis not present

## 2018-01-13 DIAGNOSIS — Z741 Need for assistance with personal care: Secondary | ICD-10-CM | POA: Diagnosis not present

## 2018-01-13 DIAGNOSIS — M6281 Muscle weakness (generalized): Secondary | ICD-10-CM | POA: Diagnosis not present

## 2018-01-13 DIAGNOSIS — E1140 Type 2 diabetes mellitus with diabetic neuropathy, unspecified: Secondary | ICD-10-CM | POA: Diagnosis not present

## 2018-01-14 DIAGNOSIS — Z741 Need for assistance with personal care: Secondary | ICD-10-CM | POA: Diagnosis not present

## 2018-01-14 DIAGNOSIS — M6281 Muscle weakness (generalized): Secondary | ICD-10-CM | POA: Diagnosis not present

## 2018-01-14 DIAGNOSIS — E1140 Type 2 diabetes mellitus with diabetic neuropathy, unspecified: Secondary | ICD-10-CM | POA: Diagnosis not present

## 2018-01-14 DIAGNOSIS — R262 Difficulty in walking, not elsewhere classified: Secondary | ICD-10-CM | POA: Diagnosis not present

## 2018-01-15 DIAGNOSIS — E1140 Type 2 diabetes mellitus with diabetic neuropathy, unspecified: Secondary | ICD-10-CM | POA: Diagnosis not present

## 2018-01-15 DIAGNOSIS — Z741 Need for assistance with personal care: Secondary | ICD-10-CM | POA: Diagnosis not present

## 2018-01-15 DIAGNOSIS — R262 Difficulty in walking, not elsewhere classified: Secondary | ICD-10-CM | POA: Diagnosis not present

## 2018-01-15 DIAGNOSIS — M6281 Muscle weakness (generalized): Secondary | ICD-10-CM | POA: Diagnosis not present

## 2018-01-17 DIAGNOSIS — Z741 Need for assistance with personal care: Secondary | ICD-10-CM | POA: Diagnosis not present

## 2018-01-17 DIAGNOSIS — R262 Difficulty in walking, not elsewhere classified: Secondary | ICD-10-CM | POA: Diagnosis not present

## 2018-01-17 DIAGNOSIS — E1140 Type 2 diabetes mellitus with diabetic neuropathy, unspecified: Secondary | ICD-10-CM | POA: Diagnosis not present

## 2018-01-17 DIAGNOSIS — M6281 Muscle weakness (generalized): Secondary | ICD-10-CM | POA: Diagnosis not present

## 2018-01-18 DIAGNOSIS — Z741 Need for assistance with personal care: Secondary | ICD-10-CM | POA: Diagnosis not present

## 2018-01-18 DIAGNOSIS — E1140 Type 2 diabetes mellitus with diabetic neuropathy, unspecified: Secondary | ICD-10-CM | POA: Diagnosis not present

## 2018-01-18 DIAGNOSIS — E039 Hypothyroidism, unspecified: Secondary | ICD-10-CM | POA: Diagnosis not present

## 2018-01-18 DIAGNOSIS — I429 Cardiomyopathy, unspecified: Secondary | ICD-10-CM | POA: Diagnosis not present

## 2018-01-18 DIAGNOSIS — E43 Unspecified severe protein-calorie malnutrition: Secondary | ICD-10-CM | POA: Diagnosis not present

## 2018-01-18 DIAGNOSIS — M6281 Muscle weakness (generalized): Secondary | ICD-10-CM | POA: Diagnosis not present

## 2018-01-18 DIAGNOSIS — R262 Difficulty in walking, not elsewhere classified: Secondary | ICD-10-CM | POA: Diagnosis not present

## 2018-01-18 DIAGNOSIS — R296 Repeated falls: Secondary | ICD-10-CM | POA: Diagnosis not present

## 2018-01-19 DIAGNOSIS — M6281 Muscle weakness (generalized): Secondary | ICD-10-CM | POA: Diagnosis not present

## 2018-01-19 DIAGNOSIS — R262 Difficulty in walking, not elsewhere classified: Secondary | ICD-10-CM | POA: Diagnosis not present

## 2018-01-19 DIAGNOSIS — E1140 Type 2 diabetes mellitus with diabetic neuropathy, unspecified: Secondary | ICD-10-CM | POA: Diagnosis not present

## 2018-01-19 DIAGNOSIS — Z741 Need for assistance with personal care: Secondary | ICD-10-CM | POA: Diagnosis not present

## 2018-01-20 DIAGNOSIS — M6281 Muscle weakness (generalized): Secondary | ICD-10-CM | POA: Diagnosis not present

## 2018-01-20 DIAGNOSIS — Z741 Need for assistance with personal care: Secondary | ICD-10-CM | POA: Diagnosis not present

## 2018-01-20 DIAGNOSIS — E1140 Type 2 diabetes mellitus with diabetic neuropathy, unspecified: Secondary | ICD-10-CM | POA: Diagnosis not present

## 2018-01-20 DIAGNOSIS — R262 Difficulty in walking, not elsewhere classified: Secondary | ICD-10-CM | POA: Diagnosis not present

## 2018-01-21 DIAGNOSIS — R296 Repeated falls: Secondary | ICD-10-CM | POA: Diagnosis not present

## 2018-01-21 DIAGNOSIS — E1140 Type 2 diabetes mellitus with diabetic neuropathy, unspecified: Secondary | ICD-10-CM | POA: Diagnosis not present

## 2018-01-21 DIAGNOSIS — J309 Allergic rhinitis, unspecified: Secondary | ICD-10-CM | POA: Diagnosis not present

## 2018-01-21 DIAGNOSIS — R262 Difficulty in walking, not elsewhere classified: Secondary | ICD-10-CM | POA: Diagnosis not present

## 2018-01-21 DIAGNOSIS — M6281 Muscle weakness (generalized): Secondary | ICD-10-CM | POA: Diagnosis not present

## 2018-01-21 DIAGNOSIS — M21611 Bunion of right foot: Secondary | ICD-10-CM | POA: Diagnosis not present

## 2018-01-21 DIAGNOSIS — Z741 Need for assistance with personal care: Secondary | ICD-10-CM | POA: Diagnosis not present

## 2018-01-21 DIAGNOSIS — I429 Cardiomyopathy, unspecified: Secondary | ICD-10-CM | POA: Diagnosis not present

## 2018-01-22 DIAGNOSIS — E1140 Type 2 diabetes mellitus with diabetic neuropathy, unspecified: Secondary | ICD-10-CM | POA: Diagnosis not present

## 2018-01-22 DIAGNOSIS — R262 Difficulty in walking, not elsewhere classified: Secondary | ICD-10-CM | POA: Diagnosis not present

## 2018-01-22 DIAGNOSIS — M6281 Muscle weakness (generalized): Secondary | ICD-10-CM | POA: Diagnosis not present

## 2018-01-22 DIAGNOSIS — Z741 Need for assistance with personal care: Secondary | ICD-10-CM | POA: Diagnosis not present

## 2018-01-23 DIAGNOSIS — E1140 Type 2 diabetes mellitus with diabetic neuropathy, unspecified: Secondary | ICD-10-CM | POA: Diagnosis not present

## 2018-01-23 DIAGNOSIS — R262 Difficulty in walking, not elsewhere classified: Secondary | ICD-10-CM | POA: Diagnosis not present

## 2018-01-23 DIAGNOSIS — M6281 Muscle weakness (generalized): Secondary | ICD-10-CM | POA: Diagnosis not present

## 2018-01-23 DIAGNOSIS — Z741 Need for assistance with personal care: Secondary | ICD-10-CM | POA: Diagnosis not present

## 2018-01-25 DIAGNOSIS — E1140 Type 2 diabetes mellitus with diabetic neuropathy, unspecified: Secondary | ICD-10-CM | POA: Diagnosis not present

## 2018-01-25 DIAGNOSIS — M6281 Muscle weakness (generalized): Secondary | ICD-10-CM | POA: Diagnosis not present

## 2018-01-25 DIAGNOSIS — Z741 Need for assistance with personal care: Secondary | ICD-10-CM | POA: Diagnosis not present

## 2018-01-25 DIAGNOSIS — R262 Difficulty in walking, not elsewhere classified: Secondary | ICD-10-CM | POA: Diagnosis not present

## 2018-01-26 DIAGNOSIS — E1140 Type 2 diabetes mellitus with diabetic neuropathy, unspecified: Secondary | ICD-10-CM | POA: Diagnosis not present

## 2018-01-26 DIAGNOSIS — N390 Urinary tract infection, site not specified: Secondary | ICD-10-CM | POA: Diagnosis not present

## 2018-01-26 DIAGNOSIS — M6281 Muscle weakness (generalized): Secondary | ICD-10-CM | POA: Diagnosis not present

## 2018-01-26 DIAGNOSIS — R262 Difficulty in walking, not elsewhere classified: Secondary | ICD-10-CM | POA: Diagnosis not present

## 2018-01-26 DIAGNOSIS — Z741 Need for assistance with personal care: Secondary | ICD-10-CM | POA: Diagnosis not present

## 2018-01-26 DIAGNOSIS — R319 Hematuria, unspecified: Secondary | ICD-10-CM | POA: Diagnosis not present

## 2018-01-27 DIAGNOSIS — R262 Difficulty in walking, not elsewhere classified: Secondary | ICD-10-CM | POA: Diagnosis not present

## 2018-01-27 DIAGNOSIS — E1140 Type 2 diabetes mellitus with diabetic neuropathy, unspecified: Secondary | ICD-10-CM | POA: Diagnosis not present

## 2018-01-27 DIAGNOSIS — M6281 Muscle weakness (generalized): Secondary | ICD-10-CM | POA: Diagnosis not present

## 2018-01-27 DIAGNOSIS — Z741 Need for assistance with personal care: Secondary | ICD-10-CM | POA: Diagnosis not present

## 2018-01-28 DIAGNOSIS — Z741 Need for assistance with personal care: Secondary | ICD-10-CM | POA: Diagnosis not present

## 2018-01-28 DIAGNOSIS — E1140 Type 2 diabetes mellitus with diabetic neuropathy, unspecified: Secondary | ICD-10-CM | POA: Diagnosis not present

## 2018-01-28 DIAGNOSIS — R262 Difficulty in walking, not elsewhere classified: Secondary | ICD-10-CM | POA: Diagnosis not present

## 2018-01-28 DIAGNOSIS — M6281 Muscle weakness (generalized): Secondary | ICD-10-CM | POA: Diagnosis not present

## 2018-01-29 DIAGNOSIS — E1140 Type 2 diabetes mellitus with diabetic neuropathy, unspecified: Secondary | ICD-10-CM | POA: Diagnosis not present

## 2018-01-29 DIAGNOSIS — M6281 Muscle weakness (generalized): Secondary | ICD-10-CM | POA: Diagnosis not present

## 2018-01-29 DIAGNOSIS — R262 Difficulty in walking, not elsewhere classified: Secondary | ICD-10-CM | POA: Diagnosis not present

## 2018-01-29 DIAGNOSIS — Z741 Need for assistance with personal care: Secondary | ICD-10-CM | POA: Diagnosis not present

## 2018-02-01 DIAGNOSIS — M6281 Muscle weakness (generalized): Secondary | ICD-10-CM | POA: Diagnosis not present

## 2018-02-01 DIAGNOSIS — E1140 Type 2 diabetes mellitus with diabetic neuropathy, unspecified: Secondary | ICD-10-CM | POA: Diagnosis not present

## 2018-02-01 DIAGNOSIS — R262 Difficulty in walking, not elsewhere classified: Secondary | ICD-10-CM | POA: Diagnosis not present

## 2018-02-01 DIAGNOSIS — Z741 Need for assistance with personal care: Secondary | ICD-10-CM | POA: Diagnosis not present

## 2018-02-02 DIAGNOSIS — E1140 Type 2 diabetes mellitus with diabetic neuropathy, unspecified: Secondary | ICD-10-CM | POA: Diagnosis not present

## 2018-02-02 DIAGNOSIS — Z741 Need for assistance with personal care: Secondary | ICD-10-CM | POA: Diagnosis not present

## 2018-02-02 DIAGNOSIS — M6281 Muscle weakness (generalized): Secondary | ICD-10-CM | POA: Diagnosis not present

## 2018-02-02 DIAGNOSIS — R262 Difficulty in walking, not elsewhere classified: Secondary | ICD-10-CM | POA: Diagnosis not present

## 2018-02-03 DIAGNOSIS — Z741 Need for assistance with personal care: Secondary | ICD-10-CM | POA: Diagnosis not present

## 2018-02-03 DIAGNOSIS — R262 Difficulty in walking, not elsewhere classified: Secondary | ICD-10-CM | POA: Diagnosis not present

## 2018-02-03 DIAGNOSIS — M6281 Muscle weakness (generalized): Secondary | ICD-10-CM | POA: Diagnosis not present

## 2018-02-03 DIAGNOSIS — E1140 Type 2 diabetes mellitus with diabetic neuropathy, unspecified: Secondary | ICD-10-CM | POA: Diagnosis not present

## 2018-02-04 DIAGNOSIS — M6281 Muscle weakness (generalized): Secondary | ICD-10-CM | POA: Diagnosis not present

## 2018-02-04 DIAGNOSIS — E1140 Type 2 diabetes mellitus with diabetic neuropathy, unspecified: Secondary | ICD-10-CM | POA: Diagnosis not present

## 2018-02-04 DIAGNOSIS — Z741 Need for assistance with personal care: Secondary | ICD-10-CM | POA: Diagnosis not present

## 2018-02-04 DIAGNOSIS — R262 Difficulty in walking, not elsewhere classified: Secondary | ICD-10-CM | POA: Diagnosis not present

## 2018-02-05 DIAGNOSIS — M6281 Muscle weakness (generalized): Secondary | ICD-10-CM | POA: Diagnosis not present

## 2018-02-05 DIAGNOSIS — R262 Difficulty in walking, not elsewhere classified: Secondary | ICD-10-CM | POA: Diagnosis not present

## 2018-02-05 DIAGNOSIS — E1140 Type 2 diabetes mellitus with diabetic neuropathy, unspecified: Secondary | ICD-10-CM | POA: Diagnosis not present

## 2018-02-05 DIAGNOSIS — Z741 Need for assistance with personal care: Secondary | ICD-10-CM | POA: Diagnosis not present

## 2018-02-08 DIAGNOSIS — M6281 Muscle weakness (generalized): Secondary | ICD-10-CM | POA: Diagnosis not present

## 2018-02-08 DIAGNOSIS — E1140 Type 2 diabetes mellitus with diabetic neuropathy, unspecified: Secondary | ICD-10-CM | POA: Diagnosis not present

## 2018-02-08 DIAGNOSIS — Z741 Need for assistance with personal care: Secondary | ICD-10-CM | POA: Diagnosis not present

## 2018-02-08 DIAGNOSIS — R262 Difficulty in walking, not elsewhere classified: Secondary | ICD-10-CM | POA: Diagnosis not present

## 2018-02-09 DIAGNOSIS — E119 Type 2 diabetes mellitus without complications: Secondary | ICD-10-CM | POA: Diagnosis not present

## 2018-02-09 DIAGNOSIS — R319 Hematuria, unspecified: Secondary | ICD-10-CM | POA: Diagnosis not present

## 2018-02-09 DIAGNOSIS — E1140 Type 2 diabetes mellitus with diabetic neuropathy, unspecified: Secondary | ICD-10-CM | POA: Diagnosis not present

## 2018-02-09 DIAGNOSIS — N390 Urinary tract infection, site not specified: Secondary | ICD-10-CM | POA: Diagnosis not present

## 2018-02-09 DIAGNOSIS — Z79899 Other long term (current) drug therapy: Secondary | ICD-10-CM | POA: Diagnosis not present

## 2018-02-09 DIAGNOSIS — R262 Difficulty in walking, not elsewhere classified: Secondary | ICD-10-CM | POA: Diagnosis not present

## 2018-02-09 DIAGNOSIS — M6281 Muscle weakness (generalized): Secondary | ICD-10-CM | POA: Diagnosis not present

## 2018-02-09 DIAGNOSIS — Z741 Need for assistance with personal care: Secondary | ICD-10-CM | POA: Diagnosis not present

## 2018-02-10 DIAGNOSIS — R262 Difficulty in walking, not elsewhere classified: Secondary | ICD-10-CM | POA: Diagnosis not present

## 2018-02-10 DIAGNOSIS — M21611 Bunion of right foot: Secondary | ICD-10-CM | POA: Diagnosis not present

## 2018-02-10 DIAGNOSIS — E1140 Type 2 diabetes mellitus with diabetic neuropathy, unspecified: Secondary | ICD-10-CM | POA: Diagnosis not present

## 2018-02-10 DIAGNOSIS — M21612 Bunion of left foot: Secondary | ICD-10-CM | POA: Diagnosis not present

## 2018-02-10 DIAGNOSIS — E039 Hypothyroidism, unspecified: Secondary | ICD-10-CM | POA: Diagnosis not present

## 2018-02-10 DIAGNOSIS — Z741 Need for assistance with personal care: Secondary | ICD-10-CM | POA: Diagnosis not present

## 2018-02-10 DIAGNOSIS — M170 Bilateral primary osteoarthritis of knee: Secondary | ICD-10-CM | POA: Diagnosis not present

## 2018-02-10 DIAGNOSIS — M6281 Muscle weakness (generalized): Secondary | ICD-10-CM | POA: Diagnosis not present

## 2018-02-11 DIAGNOSIS — R262 Difficulty in walking, not elsewhere classified: Secondary | ICD-10-CM | POA: Diagnosis not present

## 2018-02-11 DIAGNOSIS — Z741 Need for assistance with personal care: Secondary | ICD-10-CM | POA: Diagnosis not present

## 2018-02-11 DIAGNOSIS — E1140 Type 2 diabetes mellitus with diabetic neuropathy, unspecified: Secondary | ICD-10-CM | POA: Diagnosis not present

## 2018-02-11 DIAGNOSIS — M6281 Muscle weakness (generalized): Secondary | ICD-10-CM | POA: Diagnosis not present

## 2018-02-12 DIAGNOSIS — E1140 Type 2 diabetes mellitus with diabetic neuropathy, unspecified: Secondary | ICD-10-CM | POA: Diagnosis not present

## 2018-02-12 DIAGNOSIS — Z741 Need for assistance with personal care: Secondary | ICD-10-CM | POA: Diagnosis not present

## 2018-02-12 DIAGNOSIS — M6281 Muscle weakness (generalized): Secondary | ICD-10-CM | POA: Diagnosis not present

## 2018-02-12 DIAGNOSIS — R262 Difficulty in walking, not elsewhere classified: Secondary | ICD-10-CM | POA: Diagnosis not present

## 2018-02-14 DIAGNOSIS — I252 Old myocardial infarction: Secondary | ICD-10-CM | POA: Diagnosis not present

## 2018-02-14 DIAGNOSIS — Z7982 Long term (current) use of aspirin: Secondary | ICD-10-CM | POA: Diagnosis not present

## 2018-02-14 DIAGNOSIS — N183 Chronic kidney disease, stage 3 (moderate): Secondary | ICD-10-CM | POA: Diagnosis not present

## 2018-02-14 DIAGNOSIS — E039 Hypothyroidism, unspecified: Secondary | ICD-10-CM | POA: Diagnosis not present

## 2018-02-14 DIAGNOSIS — F329 Major depressive disorder, single episode, unspecified: Secondary | ICD-10-CM | POA: Diagnosis not present

## 2018-02-14 DIAGNOSIS — H401190 Primary open-angle glaucoma, unspecified eye, stage unspecified: Secondary | ICD-10-CM | POA: Diagnosis not present

## 2018-02-14 DIAGNOSIS — Z9181 History of falling: Secondary | ICD-10-CM | POA: Diagnosis not present

## 2018-02-14 DIAGNOSIS — I5022 Chronic systolic (congestive) heart failure: Secondary | ICD-10-CM | POA: Diagnosis not present

## 2018-02-14 DIAGNOSIS — I130 Hypertensive heart and chronic kidney disease with heart failure and stage 1 through stage 4 chronic kidney disease, or unspecified chronic kidney disease: Secondary | ICD-10-CM | POA: Diagnosis not present

## 2018-02-14 DIAGNOSIS — F0280 Dementia in other diseases classified elsewhere without behavioral disturbance: Secondary | ICD-10-CM | POA: Diagnosis not present

## 2018-02-14 DIAGNOSIS — Z96612 Presence of left artificial shoulder joint: Secondary | ICD-10-CM | POA: Diagnosis not present

## 2018-02-14 DIAGNOSIS — I429 Cardiomyopathy, unspecified: Secondary | ICD-10-CM | POA: Diagnosis not present

## 2018-02-14 DIAGNOSIS — E43 Unspecified severe protein-calorie malnutrition: Secondary | ICD-10-CM | POA: Diagnosis not present

## 2018-02-14 DIAGNOSIS — M170 Bilateral primary osteoarthritis of knee: Secondary | ICD-10-CM | POA: Diagnosis not present

## 2018-02-14 DIAGNOSIS — G894 Chronic pain syndrome: Secondary | ICD-10-CM | POA: Diagnosis not present

## 2018-02-15 DIAGNOSIS — I252 Old myocardial infarction: Secondary | ICD-10-CM | POA: Diagnosis not present

## 2018-02-15 DIAGNOSIS — M170 Bilateral primary osteoarthritis of knee: Secondary | ICD-10-CM | POA: Diagnosis not present

## 2018-02-15 DIAGNOSIS — F0280 Dementia in other diseases classified elsewhere without behavioral disturbance: Secondary | ICD-10-CM | POA: Diagnosis not present

## 2018-02-15 DIAGNOSIS — E039 Hypothyroidism, unspecified: Secondary | ICD-10-CM | POA: Diagnosis not present

## 2018-02-15 DIAGNOSIS — G894 Chronic pain syndrome: Secondary | ICD-10-CM | POA: Diagnosis not present

## 2018-02-15 DIAGNOSIS — E43 Unspecified severe protein-calorie malnutrition: Secondary | ICD-10-CM | POA: Diagnosis not present

## 2018-02-18 DIAGNOSIS — E43 Unspecified severe protein-calorie malnutrition: Secondary | ICD-10-CM | POA: Diagnosis not present

## 2018-02-18 DIAGNOSIS — F0280 Dementia in other diseases classified elsewhere without behavioral disturbance: Secondary | ICD-10-CM | POA: Diagnosis not present

## 2018-02-18 DIAGNOSIS — M170 Bilateral primary osteoarthritis of knee: Secondary | ICD-10-CM | POA: Diagnosis not present

## 2018-02-18 DIAGNOSIS — I252 Old myocardial infarction: Secondary | ICD-10-CM | POA: Diagnosis not present

## 2018-02-18 DIAGNOSIS — E039 Hypothyroidism, unspecified: Secondary | ICD-10-CM | POA: Diagnosis not present

## 2018-02-18 DIAGNOSIS — G894 Chronic pain syndrome: Secondary | ICD-10-CM | POA: Diagnosis not present

## 2018-02-23 DIAGNOSIS — G894 Chronic pain syndrome: Secondary | ICD-10-CM | POA: Diagnosis not present

## 2018-02-23 DIAGNOSIS — M170 Bilateral primary osteoarthritis of knee: Secondary | ICD-10-CM | POA: Diagnosis not present

## 2018-02-23 DIAGNOSIS — E039 Hypothyroidism, unspecified: Secondary | ICD-10-CM | POA: Diagnosis not present

## 2018-02-23 DIAGNOSIS — E43 Unspecified severe protein-calorie malnutrition: Secondary | ICD-10-CM | POA: Diagnosis not present

## 2018-02-23 DIAGNOSIS — F0280 Dementia in other diseases classified elsewhere without behavioral disturbance: Secondary | ICD-10-CM | POA: Diagnosis not present

## 2018-02-23 DIAGNOSIS — I252 Old myocardial infarction: Secondary | ICD-10-CM | POA: Diagnosis not present

## 2018-02-25 DIAGNOSIS — E43 Unspecified severe protein-calorie malnutrition: Secondary | ICD-10-CM | POA: Diagnosis not present

## 2018-02-25 DIAGNOSIS — F0280 Dementia in other diseases classified elsewhere without behavioral disturbance: Secondary | ICD-10-CM | POA: Diagnosis not present

## 2018-02-25 DIAGNOSIS — I252 Old myocardial infarction: Secondary | ICD-10-CM | POA: Diagnosis not present

## 2018-02-25 DIAGNOSIS — E039 Hypothyroidism, unspecified: Secondary | ICD-10-CM | POA: Diagnosis not present

## 2018-02-25 DIAGNOSIS — M170 Bilateral primary osteoarthritis of knee: Secondary | ICD-10-CM | POA: Diagnosis not present

## 2018-02-25 DIAGNOSIS — G894 Chronic pain syndrome: Secondary | ICD-10-CM | POA: Diagnosis not present

## 2018-02-27 DIAGNOSIS — F0280 Dementia in other diseases classified elsewhere without behavioral disturbance: Secondary | ICD-10-CM | POA: Diagnosis not present

## 2018-02-27 DIAGNOSIS — M170 Bilateral primary osteoarthritis of knee: Secondary | ICD-10-CM | POA: Diagnosis not present

## 2018-02-27 DIAGNOSIS — E43 Unspecified severe protein-calorie malnutrition: Secondary | ICD-10-CM | POA: Diagnosis not present

## 2018-02-27 DIAGNOSIS — G894 Chronic pain syndrome: Secondary | ICD-10-CM | POA: Diagnosis not present

## 2018-02-27 DIAGNOSIS — I252 Old myocardial infarction: Secondary | ICD-10-CM | POA: Diagnosis not present

## 2018-02-27 DIAGNOSIS — E039 Hypothyroidism, unspecified: Secondary | ICD-10-CM | POA: Diagnosis not present

## 2018-03-01 DIAGNOSIS — M170 Bilateral primary osteoarthritis of knee: Secondary | ICD-10-CM | POA: Diagnosis not present

## 2018-03-01 DIAGNOSIS — G894 Chronic pain syndrome: Secondary | ICD-10-CM | POA: Diagnosis not present

## 2018-03-01 DIAGNOSIS — F0280 Dementia in other diseases classified elsewhere without behavioral disturbance: Secondary | ICD-10-CM | POA: Diagnosis not present

## 2018-03-01 DIAGNOSIS — I252 Old myocardial infarction: Secondary | ICD-10-CM | POA: Diagnosis not present

## 2018-03-01 DIAGNOSIS — E43 Unspecified severe protein-calorie malnutrition: Secondary | ICD-10-CM | POA: Diagnosis not present

## 2018-03-01 DIAGNOSIS — E039 Hypothyroidism, unspecified: Secondary | ICD-10-CM | POA: Diagnosis not present

## 2018-03-02 DIAGNOSIS — M170 Bilateral primary osteoarthritis of knee: Secondary | ICD-10-CM | POA: Diagnosis not present

## 2018-03-02 DIAGNOSIS — F0280 Dementia in other diseases classified elsewhere without behavioral disturbance: Secondary | ICD-10-CM | POA: Diagnosis not present

## 2018-03-02 DIAGNOSIS — E43 Unspecified severe protein-calorie malnutrition: Secondary | ICD-10-CM | POA: Diagnosis not present

## 2018-03-02 DIAGNOSIS — G894 Chronic pain syndrome: Secondary | ICD-10-CM | POA: Diagnosis not present

## 2018-03-02 DIAGNOSIS — I252 Old myocardial infarction: Secondary | ICD-10-CM | POA: Diagnosis not present

## 2018-03-02 DIAGNOSIS — E039 Hypothyroidism, unspecified: Secondary | ICD-10-CM | POA: Diagnosis not present

## 2018-03-03 DIAGNOSIS — G894 Chronic pain syndrome: Secondary | ICD-10-CM | POA: Diagnosis not present

## 2018-03-03 DIAGNOSIS — M170 Bilateral primary osteoarthritis of knee: Secondary | ICD-10-CM | POA: Diagnosis not present

## 2018-03-03 DIAGNOSIS — F0280 Dementia in other diseases classified elsewhere without behavioral disturbance: Secondary | ICD-10-CM | POA: Diagnosis not present

## 2018-03-03 DIAGNOSIS — I252 Old myocardial infarction: Secondary | ICD-10-CM | POA: Diagnosis not present

## 2018-03-03 DIAGNOSIS — E43 Unspecified severe protein-calorie malnutrition: Secondary | ICD-10-CM | POA: Diagnosis not present

## 2018-03-03 DIAGNOSIS — E039 Hypothyroidism, unspecified: Secondary | ICD-10-CM | POA: Diagnosis not present

## 2018-03-04 DIAGNOSIS — E039 Hypothyroidism, unspecified: Secondary | ICD-10-CM | POA: Diagnosis not present

## 2018-03-04 DIAGNOSIS — E43 Unspecified severe protein-calorie malnutrition: Secondary | ICD-10-CM | POA: Diagnosis not present

## 2018-03-04 DIAGNOSIS — M170 Bilateral primary osteoarthritis of knee: Secondary | ICD-10-CM | POA: Diagnosis not present

## 2018-03-04 DIAGNOSIS — F0280 Dementia in other diseases classified elsewhere without behavioral disturbance: Secondary | ICD-10-CM | POA: Diagnosis not present

## 2018-03-04 DIAGNOSIS — I252 Old myocardial infarction: Secondary | ICD-10-CM | POA: Diagnosis not present

## 2018-03-04 DIAGNOSIS — G894 Chronic pain syndrome: Secondary | ICD-10-CM | POA: Diagnosis not present

## 2018-03-05 DIAGNOSIS — M170 Bilateral primary osteoarthritis of knee: Secondary | ICD-10-CM | POA: Diagnosis not present

## 2018-03-05 DIAGNOSIS — I252 Old myocardial infarction: Secondary | ICD-10-CM | POA: Diagnosis not present

## 2018-03-05 DIAGNOSIS — F0280 Dementia in other diseases classified elsewhere without behavioral disturbance: Secondary | ICD-10-CM | POA: Diagnosis not present

## 2018-03-05 DIAGNOSIS — G894 Chronic pain syndrome: Secondary | ICD-10-CM | POA: Diagnosis not present

## 2018-03-05 DIAGNOSIS — E039 Hypothyroidism, unspecified: Secondary | ICD-10-CM | POA: Diagnosis not present

## 2018-03-05 DIAGNOSIS — E43 Unspecified severe protein-calorie malnutrition: Secondary | ICD-10-CM | POA: Diagnosis not present

## 2018-03-09 DIAGNOSIS — I252 Old myocardial infarction: Secondary | ICD-10-CM | POA: Diagnosis not present

## 2018-03-09 DIAGNOSIS — E039 Hypothyroidism, unspecified: Secondary | ICD-10-CM | POA: Diagnosis not present

## 2018-03-09 DIAGNOSIS — E43 Unspecified severe protein-calorie malnutrition: Secondary | ICD-10-CM | POA: Diagnosis not present

## 2018-03-09 DIAGNOSIS — G894 Chronic pain syndrome: Secondary | ICD-10-CM | POA: Diagnosis not present

## 2018-03-09 DIAGNOSIS — M170 Bilateral primary osteoarthritis of knee: Secondary | ICD-10-CM | POA: Diagnosis not present

## 2018-03-09 DIAGNOSIS — F0280 Dementia in other diseases classified elsewhere without behavioral disturbance: Secondary | ICD-10-CM | POA: Diagnosis not present

## 2018-03-10 DIAGNOSIS — E43 Unspecified severe protein-calorie malnutrition: Secondary | ICD-10-CM | POA: Diagnosis not present

## 2018-03-10 DIAGNOSIS — G894 Chronic pain syndrome: Secondary | ICD-10-CM | POA: Diagnosis not present

## 2018-03-10 DIAGNOSIS — I252 Old myocardial infarction: Secondary | ICD-10-CM | POA: Diagnosis not present

## 2018-03-10 DIAGNOSIS — E039 Hypothyroidism, unspecified: Secondary | ICD-10-CM | POA: Diagnosis not present

## 2018-03-10 DIAGNOSIS — F0280 Dementia in other diseases classified elsewhere without behavioral disturbance: Secondary | ICD-10-CM | POA: Diagnosis not present

## 2018-03-10 DIAGNOSIS — M170 Bilateral primary osteoarthritis of knee: Secondary | ICD-10-CM | POA: Diagnosis not present

## 2018-03-11 ENCOUNTER — Encounter (INDEPENDENT_AMBULATORY_CARE_PROVIDER_SITE_OTHER): Payer: Self-pay | Admitting: Orthopaedic Surgery

## 2018-03-11 ENCOUNTER — Ambulatory Visit (INDEPENDENT_AMBULATORY_CARE_PROVIDER_SITE_OTHER): Payer: Medicare Other | Admitting: Orthopaedic Surgery

## 2018-03-11 VITALS — Ht 59.0 in

## 2018-03-11 DIAGNOSIS — I252 Old myocardial infarction: Secondary | ICD-10-CM | POA: Diagnosis not present

## 2018-03-11 DIAGNOSIS — E039 Hypothyroidism, unspecified: Secondary | ICD-10-CM | POA: Diagnosis not present

## 2018-03-11 DIAGNOSIS — G894 Chronic pain syndrome: Secondary | ICD-10-CM | POA: Diagnosis not present

## 2018-03-11 DIAGNOSIS — M170 Bilateral primary osteoarthritis of knee: Secondary | ICD-10-CM | POA: Diagnosis not present

## 2018-03-11 DIAGNOSIS — M1711 Unilateral primary osteoarthritis, right knee: Secondary | ICD-10-CM

## 2018-03-11 DIAGNOSIS — F0280 Dementia in other diseases classified elsewhere without behavioral disturbance: Secondary | ICD-10-CM | POA: Diagnosis not present

## 2018-03-11 DIAGNOSIS — E43 Unspecified severe protein-calorie malnutrition: Secondary | ICD-10-CM | POA: Diagnosis not present

## 2018-03-11 MED ORDER — METHYLPREDNISOLONE ACETATE 40 MG/ML IJ SUSP
80.0000 mg | INTRAMUSCULAR | Status: AC | PRN
  Administered 2018-03-11: 80 mg via INTRA_ARTICULAR

## 2018-03-11 MED ORDER — BUPIVACAINE HCL 0.5 % IJ SOLN
2.0000 mL | INTRAMUSCULAR | Status: AC | PRN
  Administered 2018-03-11: 2 mL via INTRA_ARTICULAR

## 2018-03-11 MED ORDER — LIDOCAINE HCL 1 % IJ SOLN
2.0000 mL | INTRAMUSCULAR | Status: AC | PRN
  Administered 2018-03-11: 2 mL

## 2018-03-11 NOTE — Progress Notes (Signed)
Office Visit Note   Patient: Andrea Gilmore           Date of Birth: 05/22/1925           MRN: [id number] Visit Date: 03/11/2018              Requested by: Mayra Neer, MD [address] [place], [place] [address] PCP: Mayra Neer, MD   Assessment & Plan: Visit Diagnoses:  1. Unilateral primary osteoarthritis, right knee     Plan: End-stage osteoarthritis right knee.  Will inject with cortisone Follow-Up Instructions: Return if symptoms worsen or fail to improve.   Orders:  Orders Placed This Encounter  Procedures  . Large Joint Inj: R knee   No orders of the defined types were placed in this encounter.     Procedures: Large Joint Inj: R knee on 03/11/2018 3:31 PM Indications: pain and diagnostic evaluation Details: 25 G 1.5 in needle, anteromedial approach  Arthrogram: No  Medications: 2 mL lidocaine 1 %; 2 mL bupivacaine 0.5 %; 80 mg methylPREDNISolone acetate 40 MG/ML Procedure, treatment alternatives, risks and benefits explained, specific risks discussed. Consent was given by the patient. Immediately prior to procedure a time out was called to verify the correct patient, procedure, equipment, support staff and site/side marked as required. Patient was prepped and draped in the usual sterile fashion.       Clinical Data: No additional findings.   Subjective: Chief Complaint  Patient presents with  . Right Knee - Pain  Patient presents today for right knee pain. She received a cortisone injection in April of 2019. Patient states that her knee has been hurting for about 28months. She is wanting to get another injection today. She takes tramadol and tylenol for pain.  Has had prior films of the right knee consistent with end-stage osteoarthritis.  She periodically receives cortisone with good results. HPI  Review of Systems   Objective: Vital Signs: Ht 4\' 11"  (1.499 m)   BMI 15.96 kg/m   Physical Exam  Ortho Exam awake alert and  oriented x3.  Comfortable sitting.  Does use a walker for ambulation she wears bilateral ankle-foot orthotics related to her neuropathy.  Daughter accompanies her.  Thin legs.  Has lateral more than medial joint pain right knee without effusion.  No instability  Specialty Comments:  No specialty comments available.  Imaging: No results found.   PMFS History: Patient Active Problem List   Diagnosis Date Noted  . Unilateral primary osteoarthritis, right knee 01/12/2017  . Takotsubo syndrome 12/08/2014  . S/p reverse total shoulder arthroplasty   . Cardiomyopathy ([place])   . Iron deficiency anemia   . Aspiration pneumonia due to inhalation of milk ([place])   . Secondary cardiomyopathy ([place])   . Acute cystitis without hematuria   . Hypoxia   . Blood poisoning   . Aspiration pneumonia due to food (regurgitated) ([place])   . Chronic kidney disease, stage III (moderate) (HCC)   . UTI (lower urinary tract infection)   . Anemia, iron deficiency   . Acute blood loss anemia   . Acute encephalopathy   . Protein-calorie malnutrition, severe ([place]) 08/01/2014  . Acute systolic heart failure ([place])   . Respiratory failure ([place])   . NSTEMI (non-ST elevated myocardial infarction) ([place])   . Protein-calorie malnutrition ([place])   . Other specified hypothyroidism   . Acute pulmonary edema (HCC)   . Pleural effusion, left   . CKD (chronic kidney disease), stage III ([place])  07/28/2014  . Metabolic encephalopathy 16/10/9602  . Acute delirium 07/28/2014  . Fever 07/28/2014  . Acute respiratory failure with hypoxia ([place]) 07/28/2014  . Metabolic acidosis 54/09/8117  . Sepsis ([place]) 07/28/2014  . Left rotator cuff tear arthropathy 07/27/2014  . History of iron deficiency anemia 04/14/2013  . Acute renal failure ([place]) 04/14/2013  . Primary open angle glaucoma 04/08/2012  . History of breast cancer 06/17/2011   Past Medical History:  Diagnosis Date  . Arthritis   . Breast cancer ([place])    breast cancer  .  Cardiomyopathy ([place])    a. possibly ischemic >> NSTEMI after should surgery in 07/2014 >>Echo 07/29/14:  EF 65%;  EF 08/01/14:  EF 20-25%, ant-septal AK, Gr 2 DD, mod MR, mod LAE, mod TR, PASP 63 mmHg  . Chronic systolic CHF (congestive heart failure) ([place])   . CKD (chronic kidney disease)   . Dementia ([place])   . History of echocardiogram    Echo 9/16:  Vigorous LVF, EF 65-70%, Gr 1 DD, mild MR, mild LAE, PASP 32 mmHg  . Hypothyroidism     Family History  Problem Relation Age of Onset  . Brain cancer Mother   . Cancer - Lung Father     Past Surgical History:  Procedure Laterality Date  . ABDOMINAL HYSTERECTOMY    . BACK SURGERY    . BILATERAL CARPAL TUNNEL RELEASE    . CHOLECYSTECTOMY    . EYE SURGERY Right   . KNEE SURGERY    . REVERSE SHOULDER ARTHROPLASTY Left 07/27/2014   Procedure: REVERSE SHOULDER ARTHROPLASTY;  Surgeon: Tania Ade, MD;  Location: [place];  Service: Orthopedics;  Laterality: Left;  Left reverse total shoulder arthroplasty  . SHOULDER SURGERY Bilateral   . TONSILLECTOMY     Social History   Occupational History  . Not on file  Tobacco Use  . Smoking status: Never Smoker  . Smokeless tobacco: Never Used  Substance and Sexual Activity  . Alcohol use: No  . Drug use: No  . Sexual activity: Not Currently

## 2018-03-12 ENCOUNTER — Telehealth (INDEPENDENT_AMBULATORY_CARE_PROVIDER_SITE_OTHER): Payer: Self-pay | Admitting: Orthopaedic Surgery

## 2018-03-12 DIAGNOSIS — E039 Hypothyroidism, unspecified: Secondary | ICD-10-CM | POA: Diagnosis not present

## 2018-03-12 DIAGNOSIS — F0280 Dementia in other diseases classified elsewhere without behavioral disturbance: Secondary | ICD-10-CM | POA: Diagnosis not present

## 2018-03-12 DIAGNOSIS — I252 Old myocardial infarction: Secondary | ICD-10-CM | POA: Diagnosis not present

## 2018-03-12 DIAGNOSIS — G894 Chronic pain syndrome: Secondary | ICD-10-CM | POA: Diagnosis not present

## 2018-03-12 DIAGNOSIS — M170 Bilateral primary osteoarthritis of knee: Secondary | ICD-10-CM | POA: Diagnosis not present

## 2018-03-12 DIAGNOSIS — E43 Unspecified severe protein-calorie malnutrition: Secondary | ICD-10-CM | POA: Diagnosis not present

## 2018-03-12 NOTE — Telephone Encounter (Signed)
Patient left a voicemail stating she had a cortisone injection yesterday in her right knee and states she still has pain.  Patient is requesting a return call to let her know if she can get another injection.

## 2018-03-15 NOTE — Telephone Encounter (Signed)
Called and spoke with patient's daughter. She said that her pain is a little better since last Thursday when she received the injection. She said that she is still experiencing "slipping" in her knee and feels that the injection has not worked as well as it did in the past. She takes tramadol BID and extra strength tylenol. I informed her that Aaron Edelman would be in the office tomorrow and someone would call her back with any recommendations.

## 2018-03-16 DIAGNOSIS — F0280 Dementia in other diseases classified elsewhere without behavioral disturbance: Secondary | ICD-10-CM | POA: Diagnosis not present

## 2018-03-16 DIAGNOSIS — N183 Chronic kidney disease, stage 3 (moderate): Secondary | ICD-10-CM | POA: Diagnosis not present

## 2018-03-16 DIAGNOSIS — E43 Unspecified severe protein-calorie malnutrition: Secondary | ICD-10-CM | POA: Diagnosis not present

## 2018-03-16 DIAGNOSIS — F329 Major depressive disorder, single episode, unspecified: Secondary | ICD-10-CM | POA: Diagnosis not present

## 2018-03-16 DIAGNOSIS — I130 Hypertensive heart and chronic kidney disease with heart failure and stage 1 through stage 4 chronic kidney disease, or unspecified chronic kidney disease: Secondary | ICD-10-CM | POA: Diagnosis not present

## 2018-03-16 DIAGNOSIS — I252 Old myocardial infarction: Secondary | ICD-10-CM | POA: Diagnosis not present

## 2018-03-16 DIAGNOSIS — M170 Bilateral primary osteoarthritis of knee: Secondary | ICD-10-CM | POA: Diagnosis not present

## 2018-03-16 DIAGNOSIS — H401190 Primary open-angle glaucoma, unspecified eye, stage unspecified: Secondary | ICD-10-CM | POA: Diagnosis not present

## 2018-03-16 DIAGNOSIS — G894 Chronic pain syndrome: Secondary | ICD-10-CM | POA: Diagnosis not present

## 2018-03-16 DIAGNOSIS — Z96612 Presence of left artificial shoulder joint: Secondary | ICD-10-CM | POA: Diagnosis not present

## 2018-03-16 DIAGNOSIS — I429 Cardiomyopathy, unspecified: Secondary | ICD-10-CM | POA: Diagnosis not present

## 2018-03-16 DIAGNOSIS — I5022 Chronic systolic (congestive) heart failure: Secondary | ICD-10-CM | POA: Diagnosis not present

## 2018-03-16 DIAGNOSIS — E039 Hypothyroidism, unspecified: Secondary | ICD-10-CM | POA: Diagnosis not present

## 2018-03-16 DIAGNOSIS — Z7982 Long term (current) use of aspirin: Secondary | ICD-10-CM | POA: Diagnosis not present

## 2018-03-16 DIAGNOSIS — Z9181 History of falling: Secondary | ICD-10-CM | POA: Diagnosis not present

## 2018-03-17 DIAGNOSIS — M47816 Spondylosis without myelopathy or radiculopathy, lumbar region: Secondary | ICD-10-CM | POA: Diagnosis not present

## 2018-03-17 DIAGNOSIS — M48062 Spinal stenosis, lumbar region with neurogenic claudication: Secondary | ICD-10-CM | POA: Diagnosis not present

## 2018-03-17 DIAGNOSIS — M5136 Other intervertebral disc degeneration, lumbar region: Secondary | ICD-10-CM | POA: Diagnosis not present

## 2018-03-17 DIAGNOSIS — G894 Chronic pain syndrome: Secondary | ICD-10-CM | POA: Diagnosis not present

## 2018-03-17 DIAGNOSIS — R030 Elevated blood-pressure reading, without diagnosis of hypertension: Secondary | ICD-10-CM | POA: Diagnosis not present

## 2018-03-17 DIAGNOSIS — E039 Hypothyroidism, unspecified: Secondary | ICD-10-CM | POA: Diagnosis not present

## 2018-03-17 DIAGNOSIS — E43 Unspecified severe protein-calorie malnutrition: Secondary | ICD-10-CM | POA: Diagnosis not present

## 2018-03-17 DIAGNOSIS — F0280 Dementia in other diseases classified elsewhere without behavioral disturbance: Secondary | ICD-10-CM | POA: Diagnosis not present

## 2018-03-17 DIAGNOSIS — M170 Bilateral primary osteoarthritis of knee: Secondary | ICD-10-CM | POA: Diagnosis not present

## 2018-03-17 DIAGNOSIS — I252 Old myocardial infarction: Secondary | ICD-10-CM | POA: Diagnosis not present

## 2018-03-17 DIAGNOSIS — M4316 Spondylolisthesis, lumbar region: Secondary | ICD-10-CM | POA: Diagnosis not present

## 2018-03-18 DIAGNOSIS — G894 Chronic pain syndrome: Secondary | ICD-10-CM | POA: Diagnosis not present

## 2018-03-18 DIAGNOSIS — F0280 Dementia in other diseases classified elsewhere without behavioral disturbance: Secondary | ICD-10-CM | POA: Diagnosis not present

## 2018-03-18 DIAGNOSIS — E43 Unspecified severe protein-calorie malnutrition: Secondary | ICD-10-CM | POA: Diagnosis not present

## 2018-03-18 DIAGNOSIS — M170 Bilateral primary osteoarthritis of knee: Secondary | ICD-10-CM | POA: Diagnosis not present

## 2018-03-18 DIAGNOSIS — E039 Hypothyroidism, unspecified: Secondary | ICD-10-CM | POA: Diagnosis not present

## 2018-03-18 DIAGNOSIS — I252 Old myocardial infarction: Secondary | ICD-10-CM | POA: Diagnosis not present

## 2018-03-24 DIAGNOSIS — E43 Unspecified severe protein-calorie malnutrition: Secondary | ICD-10-CM | POA: Diagnosis not present

## 2018-03-24 DIAGNOSIS — G894 Chronic pain syndrome: Secondary | ICD-10-CM | POA: Diagnosis not present

## 2018-03-24 DIAGNOSIS — E039 Hypothyroidism, unspecified: Secondary | ICD-10-CM | POA: Diagnosis not present

## 2018-03-24 DIAGNOSIS — F0280 Dementia in other diseases classified elsewhere without behavioral disturbance: Secondary | ICD-10-CM | POA: Diagnosis not present

## 2018-03-24 DIAGNOSIS — I252 Old myocardial infarction: Secondary | ICD-10-CM | POA: Diagnosis not present

## 2018-03-24 DIAGNOSIS — M170 Bilateral primary osteoarthritis of knee: Secondary | ICD-10-CM | POA: Diagnosis not present

## 2018-03-26 DIAGNOSIS — E039 Hypothyroidism, unspecified: Secondary | ICD-10-CM | POA: Diagnosis not present

## 2018-03-26 DIAGNOSIS — I252 Old myocardial infarction: Secondary | ICD-10-CM | POA: Diagnosis not present

## 2018-03-26 DIAGNOSIS — E43 Unspecified severe protein-calorie malnutrition: Secondary | ICD-10-CM | POA: Diagnosis not present

## 2018-03-26 DIAGNOSIS — F0280 Dementia in other diseases classified elsewhere without behavioral disturbance: Secondary | ICD-10-CM | POA: Diagnosis not present

## 2018-03-26 DIAGNOSIS — G894 Chronic pain syndrome: Secondary | ICD-10-CM | POA: Diagnosis not present

## 2018-03-26 DIAGNOSIS — M170 Bilateral primary osteoarthritis of knee: Secondary | ICD-10-CM | POA: Diagnosis not present

## 2018-03-27 DIAGNOSIS — E43 Unspecified severe protein-calorie malnutrition: Secondary | ICD-10-CM | POA: Diagnosis not present

## 2018-03-27 DIAGNOSIS — M170 Bilateral primary osteoarthritis of knee: Secondary | ICD-10-CM | POA: Diagnosis not present

## 2018-03-27 DIAGNOSIS — F0280 Dementia in other diseases classified elsewhere without behavioral disturbance: Secondary | ICD-10-CM | POA: Diagnosis not present

## 2018-03-27 DIAGNOSIS — G894 Chronic pain syndrome: Secondary | ICD-10-CM | POA: Diagnosis not present

## 2018-03-27 DIAGNOSIS — E039 Hypothyroidism, unspecified: Secondary | ICD-10-CM | POA: Diagnosis not present

## 2018-03-27 DIAGNOSIS — I252 Old myocardial infarction: Secondary | ICD-10-CM | POA: Diagnosis not present

## 2018-03-31 DIAGNOSIS — G894 Chronic pain syndrome: Secondary | ICD-10-CM | POA: Diagnosis not present

## 2018-03-31 DIAGNOSIS — I252 Old myocardial infarction: Secondary | ICD-10-CM | POA: Diagnosis not present

## 2018-03-31 DIAGNOSIS — M170 Bilateral primary osteoarthritis of knee: Secondary | ICD-10-CM | POA: Diagnosis not present

## 2018-03-31 DIAGNOSIS — F0280 Dementia in other diseases classified elsewhere without behavioral disturbance: Secondary | ICD-10-CM | POA: Diagnosis not present

## 2018-03-31 DIAGNOSIS — E039 Hypothyroidism, unspecified: Secondary | ICD-10-CM | POA: Diagnosis not present

## 2018-03-31 DIAGNOSIS — E43 Unspecified severe protein-calorie malnutrition: Secondary | ICD-10-CM | POA: Diagnosis not present

## 2018-04-30 DIAGNOSIS — F322 Major depressive disorder, single episode, severe without psychotic features: Secondary | ICD-10-CM | POA: Diagnosis not present

## 2018-04-30 DIAGNOSIS — E039 Hypothyroidism, unspecified: Secondary | ICD-10-CM | POA: Diagnosis not present

## 2018-04-30 DIAGNOSIS — Z0000 Encounter for general adult medical examination without abnormal findings: Secondary | ICD-10-CM | POA: Diagnosis not present

## 2018-04-30 DIAGNOSIS — K219 Gastro-esophageal reflux disease without esophagitis: Secondary | ICD-10-CM | POA: Diagnosis not present

## 2018-04-30 DIAGNOSIS — E46 Unspecified protein-calorie malnutrition: Secondary | ICD-10-CM | POA: Diagnosis not present

## 2018-04-30 DIAGNOSIS — I10 Essential (primary) hypertension: Secondary | ICD-10-CM | POA: Diagnosis not present

## 2018-04-30 DIAGNOSIS — M549 Dorsalgia, unspecified: Secondary | ICD-10-CM | POA: Diagnosis not present

## 2018-04-30 DIAGNOSIS — M21372 Foot drop, left foot: Secondary | ICD-10-CM | POA: Diagnosis not present

## 2018-04-30 DIAGNOSIS — M810 Age-related osteoporosis without current pathological fracture: Secondary | ICD-10-CM | POA: Diagnosis not present

## 2018-04-30 DIAGNOSIS — D692 Other nonthrombocytopenic purpura: Secondary | ICD-10-CM | POA: Diagnosis not present

## 2018-04-30 DIAGNOSIS — D649 Anemia, unspecified: Secondary | ICD-10-CM | POA: Diagnosis not present

## 2018-04-30 DIAGNOSIS — M21371 Foot drop, right foot: Secondary | ICD-10-CM | POA: Diagnosis not present

## 2018-05-25 DIAGNOSIS — Z139 Encounter for screening, unspecified: Secondary | ICD-10-CM | POA: Diagnosis not present

## 2018-06-24 DIAGNOSIS — M5136 Other intervertebral disc degeneration, lumbar region: Secondary | ICD-10-CM | POA: Diagnosis not present

## 2018-06-24 DIAGNOSIS — M4726 Other spondylosis with radiculopathy, lumbar region: Secondary | ICD-10-CM | POA: Diagnosis not present

## 2018-06-24 DIAGNOSIS — M48061 Spinal stenosis, lumbar region without neurogenic claudication: Secondary | ICD-10-CM | POA: Diagnosis not present

## 2018-07-14 DIAGNOSIS — M4800 Spinal stenosis, site unspecified: Secondary | ICD-10-CM | POA: Diagnosis not present

## 2018-07-14 DIAGNOSIS — M21372 Foot drop, left foot: Secondary | ICD-10-CM | POA: Diagnosis not present

## 2018-07-14 DIAGNOSIS — M21371 Foot drop, right foot: Secondary | ICD-10-CM | POA: Diagnosis not present

## 2018-07-14 DIAGNOSIS — E039 Hypothyroidism, unspecified: Secondary | ICD-10-CM | POA: Diagnosis not present

## 2018-07-14 DIAGNOSIS — E46 Unspecified protein-calorie malnutrition: Secondary | ICD-10-CM | POA: Diagnosis not present

## 2018-07-14 DIAGNOSIS — D649 Anemia, unspecified: Secondary | ICD-10-CM | POA: Diagnosis not present

## 2018-07-14 DIAGNOSIS — Z7189 Other specified counseling: Secondary | ICD-10-CM | POA: Diagnosis not present

## 2018-07-14 DIAGNOSIS — G4700 Insomnia, unspecified: Secondary | ICD-10-CM | POA: Diagnosis not present

## 2018-07-14 DIAGNOSIS — M159 Polyosteoarthritis, unspecified: Secondary | ICD-10-CM | POA: Diagnosis not present

## 2018-07-14 DIAGNOSIS — I10 Essential (primary) hypertension: Secondary | ICD-10-CM | POA: Diagnosis not present

## 2018-07-15 ENCOUNTER — Ambulatory Visit (INDEPENDENT_AMBULATORY_CARE_PROVIDER_SITE_OTHER): Payer: Medicare Other | Admitting: Orthopaedic Surgery

## 2018-07-15 ENCOUNTER — Ambulatory Visit (INDEPENDENT_AMBULATORY_CARE_PROVIDER_SITE_OTHER): Payer: Medicare Other

## 2018-07-15 ENCOUNTER — Ambulatory Visit: Payer: Self-pay

## 2018-07-15 ENCOUNTER — Other Ambulatory Visit: Payer: Self-pay

## 2018-07-15 ENCOUNTER — Encounter: Payer: Self-pay | Admitting: Orthopaedic Surgery

## 2018-07-15 VITALS — BP 171/76 | HR 61 | Resp 18 | Ht 60.0 in | Wt 100.0 lb

## 2018-07-15 DIAGNOSIS — M25512 Pain in left shoulder: Secondary | ICD-10-CM

## 2018-07-15 DIAGNOSIS — G8929 Other chronic pain: Secondary | ICD-10-CM | POA: Diagnosis not present

## 2018-07-15 DIAGNOSIS — M25511 Pain in right shoulder: Secondary | ICD-10-CM

## 2018-07-15 MED ORDER — BUPIVACAINE HCL 0.5 % IJ SOLN
2.0000 mL | INTRAMUSCULAR | Status: AC | PRN
  Administered 2018-07-15: 18:00:00 2 mL via INTRA_ARTICULAR

## 2018-07-15 MED ORDER — METHYLPREDNISOLONE ACETATE 40 MG/ML IJ SUSP
80.0000 mg | INTRAMUSCULAR | Status: AC | PRN
  Administered 2018-07-15: 80 mg via INTRA_ARTICULAR

## 2018-07-15 MED ORDER — LIDOCAINE HCL 2 % IJ SOLN
2.0000 mL | INTRAMUSCULAR | Status: AC | PRN
  Administered 2018-07-15: 2 mL

## 2018-07-15 NOTE — Progress Notes (Signed)
Office Visit Note   Patient: Andrea Gilmore           Date of Birth: 1925/06/23           MRN: [id number] Visit Date: 07/15/2018              Requested by: Mayra Neer, MD [address] [place],  [place] [address] PCP: Mayra Neer, MD   Assessment & Plan: Visit Diagnoses:  1. Chronic pain of both shoulders     Plan: Rotator cuff arthropathy right shoulder.  Prior reverse shoulder arthroplasty left shoulder.  Will inject right shoulder after aspiration  Follow-Up Instructions: Return if symptoms worsen or fail to improve.   Orders:  Orders Placed This Encounter  Procedures  . Large Joint Inj: R subacromial bursa  . XR Shoulder Right  . XR Shoulder Left   No orders of the defined types were placed in this encounter.     Procedures: Large Joint Inj: R subacromial bursa on 07/15/2018 5:32 PM Indications: pain and diagnostic evaluation Details: 25 G 1.5 in needle, anterolateral approach  Arthrogram: No  Medications: 2 mL lidocaine 2 %; 2 mL bupivacaine 0.5 %; 80 mg methylPREDNISolone acetate 40 MG/ML Aspirate: 20 mL bloody Outcome: tolerated well, no immediate complications Consent was given by the patient. Immediately prior to procedure a time out was called to verify the correct patient, procedure, equipment, support staff and site/side marked as required. Patient was prepped and draped in the usual sterile fashion.       Clinical Data: No additional findings.   Subjective: Chief Complaint  Patient presents with  . Right Shoulder - Pain  . Left Shoulder - Pain   Ms. Andrea Gilmore is a 85 year old female who presents with bilateral shoulder pain x 6 months. She has not an injury. The right shoulder hurts worse than the left. She has limited range of motion and weakness. She takes tramadol for the pain which helps. She wants injections in both shoulders.  HPI  Review of Systems  Constitutional: Negative for fatigue.  HENT: Negative for  trouble swallowing.   Eyes: Negative for pain.  Respiratory: Negative for shortness of breath.   Cardiovascular: Negative for leg swelling.  Gastrointestinal: Negative for constipation.  Endocrine: Negative for cold intolerance.  Genitourinary: Negative for difficulty urinating.  Musculoskeletal: Positive for gait problem.  Skin: Negative for rash.  Allergic/Immunologic: Negative for food allergies.  Neurological: Positive for weakness.  Hematological: Bruises/bleeds easily.  Psychiatric/Behavioral: Negative for sleep disturbance.     Objective: Vital Signs: BP (!) 171/76 (BP Location: Left Arm, Patient Position: Sitting, Cuff Size: Normal)   Pulse 61   Resp 18   Ht 5' (1.524 m)   Wt 100 lb (45.4 kg)   BMI 19.53 kg/m   Physical Exam Constitutional:      Appearance: She is well-developed.  Eyes:     Pupils: Pupils are equal, round, and reactive to light.  Pulmonary:     Effort: Pulmonary effort is normal.  Skin:    General: Skin is warm and dry.  Neurological:     Mental Status: She is alert and oriented to person, place, and time.  Psychiatric:        Behavior: Behavior normal.     Ortho Exam awake alert and oriented x3.  Ambulates with the use of a walker.  Right shoulder with positive effusion or hemarthrosis.  Limited range of motion with crepitation. Specialty Comments:  No specialty comments available.  Imaging: Xr Shoulder Left  Result Date: 07/15/2018 Films of the left shoulder were obtained in several projections demonstrating an intact reverse shoulder arthroplasty without obvious complications  Xr Shoulder Right  Result Date: 07/15/2018 Films of the right shoulder demonstrate a high riding humeral head nearly abutting the subacromial region.  Diffuse arthritic changes and some erosive changes about the glenoid.  Films are consistent with rotator cuff arthropathy    PMFS History: Patient Active Problem List   Diagnosis Date Noted  . Unilateral primary  osteoarthritis, right knee 01/12/2017  . Takotsubo syndrome 12/08/2014  . S/p reverse total shoulder arthroplasty   . Cardiomyopathy ([place])   . Iron deficiency anemia   . Aspiration pneumonia due to inhalation of milk ([place])   . Secondary cardiomyopathy ([place])   . Acute cystitis without hematuria   . Hypoxia   . Blood poisoning   . Aspiration pneumonia due to food (regurgitated) ([place])   . Chronic kidney disease, stage III (moderate) (HCC)   . UTI (lower urinary tract infection)   . Anemia, iron deficiency   . Acute blood loss anemia   . Acute encephalopathy   . Protein-calorie malnutrition, severe ([place]) 08/01/2014  . Acute systolic heart failure ([place])   . Respiratory failure ([place])   . NSTEMI (non-ST elevated myocardial infarction) ([place])   . Protein-calorie malnutrition ([place])   . Other specified hypothyroidism   . Acute pulmonary edema (HCC)   . Pleural effusion, left   . CKD (chronic kidney disease), stage III ([place]) 07/28/2014  . Metabolic encephalopathy 46/65/9935  . Acute delirium 07/28/2014  . Fever 07/28/2014  . Acute respiratory failure with hypoxia ([place]) 07/28/2014  . Metabolic acidosis 70/17/7939  . Sepsis ([place]) 07/28/2014  . Left rotator cuff tear arthropathy 07/27/2014  . History of iron deficiency anemia 04/14/2013  . Acute renal failure ([place]) 04/14/2013  . Primary open angle glaucoma 04/08/2012  . History of breast cancer 06/17/2011   Past Medical History:  Diagnosis Date  . Arthritis   . Breast cancer ([place])    breast cancer  . Cardiomyopathy ([place])    a. possibly ischemic >> NSTEMI after should surgery in 07/2014 >>Echo 07/29/14:  EF 65%;  EF 08/01/14:  EF 20-25%, ant-septal AK, Gr 2 DD, mod MR, mod LAE, mod TR, PASP 63 mmHg  . Chronic systolic CHF (congestive heart failure) ([place])   . CKD (chronic kidney disease)   . Dementia ([place])   . History of echocardiogram    Echo 9/16:  Vigorous LVF, EF 65-70%, Gr 1 DD, mild MR, mild LAE, PASP 32 mmHg  . Hypothyroidism      Family History  Problem Relation Age of Onset  . Brain cancer Mother   . Cancer - Lung Father     Past Surgical History:  Procedure Laterality Date  . ABDOMINAL HYSTERECTOMY    . BACK SURGERY    . BILATERAL CARPAL TUNNEL RELEASE    . CHOLECYSTECTOMY    . EYE SURGERY Right   . KNEE SURGERY    . REVERSE SHOULDER ARTHROPLASTY Left 07/27/2014   Procedure: REVERSE SHOULDER ARTHROPLASTY;  Surgeon: Tania Ade, MD;  Location: [facility];  Service: Orthopedics;  Laterality: Left;  Left reverse total shoulder arthroplasty  . SHOULDER SURGERY Bilateral   . TONSILLECTOMY     Social History   Occupational History  . Not on file  Tobacco Use  . Smoking status: Never Smoker  . Smokeless tobacco: Never Used  Substance and Sexual Activity  . Alcohol use:  No  . Drug use: No  . Sexual activity: Not Currently

## 2018-07-16 ENCOUNTER — Ambulatory Visit: Payer: Medicare Other | Admitting: Orthopaedic Surgery

## 2018-07-20 DIAGNOSIS — M5136 Other intervertebral disc degeneration, lumbar region: Secondary | ICD-10-CM | POA: Diagnosis not present

## 2018-07-20 DIAGNOSIS — M48062 Spinal stenosis, lumbar region with neurogenic claudication: Secondary | ICD-10-CM | POA: Diagnosis not present

## 2018-07-20 DIAGNOSIS — M4316 Spondylolisthesis, lumbar region: Secondary | ICD-10-CM | POA: Diagnosis not present

## 2018-07-20 DIAGNOSIS — M47816 Spondylosis without myelopathy or radiculopathy, lumbar region: Secondary | ICD-10-CM | POA: Diagnosis not present

## 2018-07-23 DIAGNOSIS — E46 Unspecified protein-calorie malnutrition: Secondary | ICD-10-CM | POA: Diagnosis not present

## 2018-07-23 DIAGNOSIS — M4800 Spinal stenosis, site unspecified: Secondary | ICD-10-CM | POA: Diagnosis not present

## 2018-07-23 DIAGNOSIS — M810 Age-related osteoporosis without current pathological fracture: Secondary | ICD-10-CM | POA: Diagnosis not present

## 2018-07-23 DIAGNOSIS — D649 Anemia, unspecified: Secondary | ICD-10-CM | POA: Diagnosis not present

## 2018-08-03 ENCOUNTER — Ambulatory Visit (INDEPENDENT_AMBULATORY_CARE_PROVIDER_SITE_OTHER): Payer: Medicare Other | Admitting: Orthopaedic Surgery

## 2018-08-03 ENCOUNTER — Other Ambulatory Visit: Payer: Self-pay

## 2018-08-03 ENCOUNTER — Encounter: Payer: Self-pay | Admitting: Orthopaedic Surgery

## 2018-08-03 DIAGNOSIS — M19011 Primary osteoarthritis, right shoulder: Secondary | ICD-10-CM | POA: Insufficient documentation

## 2018-08-03 MED ORDER — LIDOCAINE HCL 2 % IJ SOLN
2.0000 mL | INTRAMUSCULAR | Status: AC | PRN
  Administered 2018-08-03: 2 mL

## 2018-08-03 MED ORDER — METHYLPREDNISOLONE ACETATE 40 MG/ML IJ SUSP
80.0000 mg | INTRAMUSCULAR | Status: AC | PRN
  Administered 2018-08-03: 15:00:00 80 mg via INTRA_ARTICULAR

## 2018-08-03 MED ORDER — BUPIVACAINE HCL 0.5 % IJ SOLN
2.0000 mL | INTRAMUSCULAR | Status: AC | PRN
  Administered 2018-08-03: 2 mL via INTRA_ARTICULAR

## 2018-08-03 NOTE — Progress Notes (Signed)
Office Visit Note   Patient: Andrea Gilmore           Date of Birth: 1925-11-14           MRN: [id number] Visit Date: 08/03/2018              Requested by: Mayra Neer, MD [address] [place],  [place] [address] PCP: Mayra Neer, MD   Assessment & Plan: Visit Diagnoses:  1. Primary osteoarthritis, right shoulder     Plan: Rotator cuff arthropathy right shoulder.  Had aspiration of right shoulder and cortisone injection from an anterior approach 3 weeks ago.  Having some localized tenderness posteriorly.  Will inject that today. Follow-Up Instructions: Return if symptoms worsen or fail to improve.   Orders:  Orders Placed This Encounter  Procedures  . Large Joint Inj: R subacromial bursa   No orders of the defined types were placed in this encounter.     Procedures: Large Joint Inj: R subacromial bursa on 08/03/2018 2:59 PM Indications: pain and diagnostic evaluation Details: 25 G 1.5 in needle, anterolateral approach  Arthrogram: No  Medications: 2 mL lidocaine 2 %; 2 mL bupivacaine 0.5 %; 80 mg methylPREDNISolone acetate 40 MG/ML Consent was given by the patient. Immediately prior to procedure a time out was called to verify the correct patient, procedure, equipment, support staff and site/side marked as required. Patient was prepped and draped in the usual sterile fashion.       Clinical Data: No additional findings.   Subjective: Chief Complaint  Patient presents with  . Right Shoulder - Pain  . Left Shoulder - Pain  Patient presents today for bilateral shoulder pain. She was here three weeks ago and received a right shoulder injection. Patient states that she is wanting her right shoulder reinjected. She takes tramadol and tylenol.  Present pain is localized posteriorly and somewhat localized  HPI  Review of Systems   Objective: Vital Signs: BP (!) 160/78   Pulse 61   Ht 5' (1.524 m)   Wt 95 lb (43.1 kg)   BMI 18.55 kg/m    Physical Exam Constitutional:      Appearance: She is well-developed.  Eyes:     Pupils: Pupils are equal, round, and reactive to light.  Pulmonary:     Effort: Pulmonary effort is normal.  Skin:    General: Skin is warm and dry.  Neurological:     Mental Status: She is alert and oriented to person, place, and time.  Psychiatric:        Behavior: Behavior normal.     Ortho Exam awake alert and oriented x3.  Accompanied by her daughter.  Uses a walker for ambulation.  Has very minimal movement of her right shoulder based on her pain  and crepitation.  Has not localized area of tenderness along the posterior subacromial region.  Skin intact.  Significant atrophy about the shoulder  Specialty Comments:  No specialty comments available.  Imaging: No results found.   PMFS History: Patient Active Problem List   Diagnosis Date Noted  . Primary osteoarthritis, right shoulder 08/03/2018  . Unilateral primary osteoarthritis, right knee 01/12/2017  . Takotsubo syndrome 12/08/2014  . S/p reverse total shoulder arthroplasty   . Cardiomyopathy ([place])   . Iron deficiency anemia   . Aspiration pneumonia due to inhalation of milk ([place])   . Secondary cardiomyopathy ([place])   . Acute cystitis without hematuria   . Hypoxia   . Blood poisoning   .  Aspiration pneumonia due to food (regurgitated) ([place])   . Chronic kidney disease, stage III (moderate) (HCC)   . UTI (lower urinary tract infection)   . Anemia, iron deficiency   . Acute blood loss anemia   . Acute encephalopathy   . Protein-calorie malnutrition, severe ([place]) 08/01/2014  . Acute systolic heart failure ([place])   . Respiratory failure ([place])   . NSTEMI (non-ST elevated myocardial infarction) ([place])   . Protein-calorie malnutrition ([place])   . Other specified hypothyroidism   . Acute pulmonary edema (HCC)   . Pleural effusion, left   . CKD (chronic kidney disease), stage III ([place]) 07/28/2014  . Metabolic encephalopathy 80/16/5537   . Acute delirium 07/28/2014  . Fever 07/28/2014  . Acute respiratory failure with hypoxia ([place]) 07/28/2014  . Metabolic acidosis 48/27/0786  . Sepsis ([place]) 07/28/2014  . Left rotator cuff tear arthropathy 07/27/2014  . History of iron deficiency anemia 04/14/2013  . Acute renal failure ([place]) 04/14/2013  . Primary open angle glaucoma 04/08/2012  . History of breast cancer 06/17/2011   Past Medical History:  Diagnosis Date  . Arthritis   . Breast cancer ([place])    breast cancer  . Cardiomyopathy ([place])    a. possibly ischemic >> NSTEMI after should surgery in 07/2014 >>Echo 07/29/14:  EF 65%;  EF 08/01/14:  EF 20-25%, ant-septal AK, Gr 2 DD, mod MR, mod LAE, mod TR, PASP 63 mmHg  . Chronic systolic CHF (congestive heart failure) ([place])   . CKD (chronic kidney disease)   . Dementia ([place])   . History of echocardiogram    Echo 9/16:  Vigorous LVF, EF 65-70%, Gr 1 DD, mild MR, mild LAE, PASP 32 mmHg  . Hypothyroidism     Family History  Problem Relation Age of Onset  . Brain cancer Mother   . Cancer - Lung Father     Past Surgical History:  Procedure Laterality Date  . ABDOMINAL HYSTERECTOMY    . BACK SURGERY    . BILATERAL CARPAL TUNNEL RELEASE    . CHOLECYSTECTOMY    . EYE SURGERY Right   . KNEE SURGERY    . REVERSE SHOULDER ARTHROPLASTY Left 07/27/2014   Procedure: REVERSE SHOULDER ARTHROPLASTY;  Surgeon: Tania Ade, MD;  Location: [facility];  Service: Orthopedics;  Laterality: Left;  Left reverse total shoulder arthroplasty  . SHOULDER SURGERY Bilateral   . TONSILLECTOMY     Social History   Occupational History  . Not on file  Tobacco Use  . Smoking status: Never Smoker  . Smokeless tobacco: Never Used  Substance and Sexual Activity  . Alcohol use: No  . Drug use: No  . Sexual activity: Not Currently

## 2018-08-11 DIAGNOSIS — H401133 Primary open-angle glaucoma, bilateral, severe stage: Secondary | ICD-10-CM | POA: Diagnosis not present

## 2018-08-11 DIAGNOSIS — Z961 Presence of intraocular lens: Secondary | ICD-10-CM | POA: Diagnosis not present

## 2018-09-23 DIAGNOSIS — M48061 Spinal stenosis, lumbar region without neurogenic claudication: Secondary | ICD-10-CM | POA: Diagnosis not present

## 2018-09-23 DIAGNOSIS — M5136 Other intervertebral disc degeneration, lumbar region: Secondary | ICD-10-CM | POA: Diagnosis not present

## 2018-09-23 DIAGNOSIS — M4726 Other spondylosis with radiculopathy, lumbar region: Secondary | ICD-10-CM | POA: Diagnosis not present

## 2018-09-30 DIAGNOSIS — I10 Essential (primary) hypertension: Secondary | ICD-10-CM | POA: Diagnosis not present

## 2018-09-30 DIAGNOSIS — E039 Hypothyroidism, unspecified: Secondary | ICD-10-CM | POA: Diagnosis not present

## 2018-09-30 DIAGNOSIS — E46 Unspecified protein-calorie malnutrition: Secondary | ICD-10-CM | POA: Diagnosis not present

## 2018-09-30 DIAGNOSIS — M810 Age-related osteoporosis without current pathological fracture: Secondary | ICD-10-CM | POA: Diagnosis not present

## 2018-09-30 DIAGNOSIS — N393 Stress incontinence (female) (male): Secondary | ICD-10-CM | POA: Diagnosis not present

## 2018-09-30 DIAGNOSIS — M549 Dorsalgia, unspecified: Secondary | ICD-10-CM | POA: Diagnosis not present

## 2018-10-01 DIAGNOSIS — Z23 Encounter for immunization: Secondary | ICD-10-CM | POA: Diagnosis not present

## 2018-11-16 DIAGNOSIS — M5416 Radiculopathy, lumbar region: Secondary | ICD-10-CM | POA: Diagnosis not present

## 2018-11-16 DIAGNOSIS — Z981 Arthrodesis status: Secondary | ICD-10-CM | POA: Diagnosis not present

## 2018-11-16 DIAGNOSIS — M48062 Spinal stenosis, lumbar region with neurogenic claudication: Secondary | ICD-10-CM | POA: Diagnosis not present

## 2018-11-16 DIAGNOSIS — M4316 Spondylolisthesis, lumbar region: Secondary | ICD-10-CM | POA: Diagnosis not present

## 2018-11-16 DIAGNOSIS — M5136 Other intervertebral disc degeneration, lumbar region: Secondary | ICD-10-CM | POA: Diagnosis not present

## 2018-11-16 DIAGNOSIS — M47816 Spondylosis without myelopathy or radiculopathy, lumbar region: Secondary | ICD-10-CM | POA: Diagnosis not present

## 2018-11-24 DIAGNOSIS — Z1231 Encounter for screening mammogram for malignant neoplasm of breast: Secondary | ICD-10-CM | POA: Diagnosis not present

## 2018-11-24 DIAGNOSIS — Z853 Personal history of malignant neoplasm of breast: Secondary | ICD-10-CM | POA: Diagnosis not present

## 2018-12-14 ENCOUNTER — Ambulatory Visit (INDEPENDENT_AMBULATORY_CARE_PROVIDER_SITE_OTHER): Payer: Medicare Other | Admitting: Orthopaedic Surgery

## 2018-12-14 ENCOUNTER — Encounter: Payer: Self-pay | Admitting: Orthopaedic Surgery

## 2018-12-14 ENCOUNTER — Other Ambulatory Visit: Payer: Self-pay

## 2018-12-14 VITALS — Ht 60.0 in | Wt 100.0 lb

## 2018-12-14 DIAGNOSIS — M19011 Primary osteoarthritis, right shoulder: Secondary | ICD-10-CM | POA: Diagnosis not present

## 2018-12-14 MED ORDER — BUPIVACAINE HCL 0.5 % IJ SOLN
2.0000 mL | INTRAMUSCULAR | Status: AC | PRN
  Administered 2018-12-14: 14:00:00 2 mL via INTRA_ARTICULAR

## 2018-12-14 MED ORDER — LIDOCAINE HCL 1 % IJ SOLN
2.0000 mL | INTRAMUSCULAR | Status: AC | PRN
  Administered 2018-12-14: 14:00:00 2 mL

## 2018-12-14 MED ORDER — METHYLPREDNISOLONE ACETATE 40 MG/ML IJ SUSP
80.0000 mg | INTRAMUSCULAR | Status: AC | PRN
  Administered 2018-12-14: 14:00:00 80 mg via INTRA_ARTICULAR

## 2018-12-14 NOTE — Progress Notes (Signed)
Office Visit Note   Patient: Andrea Gilmore           Date of Birth: [birth year]           MRN: [id number] Visit Date: 12/14/2018              Requested by: Andrea Neer, MD [address] [place],  [place] [address] PCP: Andrea Neer, MD   Assessment & Plan: Visit Diagnoses:  1. Primary osteoarthritis, right shoulder     Plan: Recurrent right shoulder pain is related to the end-stage rotator cuff arthropathy.  I have seen Andrea Gilmore on a number of occasions for the same problem.  The humeral head is articulating with the acromion.  I aspirated 40 cc of bloody fluid and injected cortisone she felt much better.  We will plan to see her back as needed.  She is accompanied by her daughter who relates that she has been walking up to a half a mile a day using her walker  Follow-Up Instructions: Return if symptoms worsen or fail to improve.   Orders:  Orders Placed This Encounter  Procedures  . Large Joint Inj: R glenohumeral   No orders of the defined types were placed in this encounter.     Procedures: Large Joint Inj: R glenohumeral on 12/14/2018 2:09 PM Indications: pain and diagnostic evaluation Details: 25 G 1.5 in needle, posterior approach  Arthrogram: No  Medications: 2 mL lidocaine 1 %; 2 mL bupivacaine 0.5 %; 80 mg methylPREDNISolone acetate 40 MG/ML Aspirate: 40 mL bloody Outcome: tolerated well, no immediate complications Consent was given by the patient. Immediately prior to procedure a time out was called to verify the correct patient, procedure, equipment, support staff and site/side marked as required. Patient was prepped and draped in the usual sterile fashion.       Clinical Data: No additional findings.   Subjective: Chief Complaint  Patient presents with  . Right Shoulder - Pain  Patient presents today for right shoulder pain. She was last evaluated on 08/03/2018 and received a cortisone injection. She said that the injection  helped, but has been hurting again for almost a month. She takes Tramadol and tylenol twice daily.  HPI  Review of Systems   Objective: Vital Signs: Ht 5' (1.524 m)   Wt 100 lb (45.4 kg)   BMI 19.53 kg/m   Physical Exam Constitutional:      Appearance: She is well-developed.  Eyes:     Pupils: Pupils are equal, round, and reactive to light.  Pulmonary:     Effort: Pulmonary effort is normal.  Skin:    General: Skin is warm and dry.  Neurological:     Mental Status: She is alert and oriented to person, place, and time.  Psychiatric:        Behavior: Behavior normal.     Ortho Exam awake alert and oriented x3.  Comfortable sitting.  Does use a walker for ambulation.  Has limited range of motion of her right shoulder related to her prior diagnosis of end-stage rotator cuff tear arthropathy.  There is some obvious effusion.  After aspiration of bloody fluid injection cortisone she had better range of motion with less pain.  Skin is intact.  Intrinsic atrophy right hand but with good grip and release  Specialty Comments:  No specialty comments available.  Imaging: No results found.   PMFS History: Patient Active Problem List   Diagnosis Date Noted  . Primary osteoarthritis, right  shoulder 08/03/2018  . Unilateral primary osteoarthritis, right knee 01/12/2017  . Takotsubo syndrome 12/08/2014  . S/p reverse total shoulder arthroplasty   . Cardiomyopathy ([place])   . Iron deficiency anemia   . Aspiration pneumonia due to inhalation of milk ([place])   . Secondary cardiomyopathy ([place])   . Acute cystitis without hematuria   . Hypoxia   . Blood poisoning   . Aspiration pneumonia due to food (regurgitated) ([place])   . Chronic kidney disease, stage III (moderate)   . UTI (lower urinary tract infection)   . Anemia, iron deficiency   . Acute blood loss anemia   . Acute encephalopathy   . Protein-calorie malnutrition, severe ([place]) 08/01/2014  . Acute systolic heart failure ([place])    . Respiratory failure ([place])   . NSTEMI (non-ST elevated myocardial infarction) ([place])   . Protein-calorie malnutrition ([place])   . Other specified hypothyroidism   . Acute pulmonary edema (HCC)   . Pleural effusion, left   . CKD (chronic kidney disease), stage III 07/28/2014  . Metabolic encephalopathy [id number]  . Acute delirium 07/28/2014  . Fever 07/28/2014  . Acute respiratory failure with hypoxia ([place]) 07/28/2014  . Metabolic acidosis Q000111Q  . Sepsis ([place]) 07/28/2014  . Left rotator cuff tear arthropathy 07/27/2014  . History of iron deficiency anemia 04/14/2013  . Acute renal failure ([place]) 04/14/2013  . Primary open angle glaucoma 04/08/2012  . History of breast cancer 06/17/2011   Past Medical History:  Diagnosis Date  . Arthritis   . Breast cancer ([place])    breast cancer  . Cardiomyopathy ([place])    a. possibly ischemic >> NSTEMI after should surgery in 07/2014 >>Echo 07/29/14:  EF 65%;  EF 08/01/14:  EF 20-25%, ant-septal AK, Gr 2 DD, mod MR, mod LAE, mod TR, PASP 63 mmHg  . Chronic systolic CHF (congestive heart failure) ([place])   . CKD (chronic kidney disease)   . Dementia ([place])   . History of echocardiogram    Echo 9/16:  Vigorous LVF, EF 65-70%, Gr 1 DD, mild MR, mild LAE, PASP 32 mmHg  . Hypothyroidism     Family History  Problem Relation Age of Onset  . Brain cancer Mother   . Cancer - Lung Father     Past Surgical History:  Procedure Laterality Date  . ABDOMINAL HYSTERECTOMY    . BACK SURGERY    . BILATERAL CARPAL TUNNEL RELEASE    . CHOLECYSTECTOMY    . EYE SURGERY Right   . KNEE SURGERY    . REVERSE SHOULDER ARTHROPLASTY Left 07/27/2014   Procedure: REVERSE SHOULDER ARTHROPLASTY;  Surgeon: Tania Ade, MD;  Location: [facility];  Service: Orthopedics;  Laterality: Left;  Left reverse total shoulder arthroplasty  . SHOULDER SURGERY Bilateral   . TONSILLECTOMY     Social History   Occupational History  . Not on file  Tobacco Use  . Smoking status:  Never Smoker  . Smokeless tobacco: Never Used  Substance and Sexual Activity  . Alcohol use: No  . Drug use: No  . Sexual activity: Not Currently

## 2019-02-07 DIAGNOSIS — R410 Disorientation, unspecified: Secondary | ICD-10-CM | POA: Diagnosis not present

## 2019-02-11 DIAGNOSIS — H401133 Primary open-angle glaucoma, bilateral, severe stage: Secondary | ICD-10-CM | POA: Diagnosis not present

## 2019-02-17 DIAGNOSIS — M810 Age-related osteoporosis without current pathological fracture: Secondary | ICD-10-CM | POA: Diagnosis not present

## 2019-02-28 ENCOUNTER — Other Ambulatory Visit: Payer: Self-pay | Admitting: Podiatry

## 2019-02-28 ENCOUNTER — Ambulatory Visit (INDEPENDENT_AMBULATORY_CARE_PROVIDER_SITE_OTHER): Payer: Medicare Other

## 2019-02-28 ENCOUNTER — Other Ambulatory Visit: Payer: Self-pay

## 2019-02-28 ENCOUNTER — Encounter: Payer: Self-pay | Admitting: Podiatry

## 2019-02-28 ENCOUNTER — Ambulatory Visit (INDEPENDENT_AMBULATORY_CARE_PROVIDER_SITE_OTHER): Payer: Medicare Other | Admitting: Podiatry

## 2019-02-28 DIAGNOSIS — M722 Plantar fascial fibromatosis: Secondary | ICD-10-CM

## 2019-02-28 DIAGNOSIS — M79672 Pain in left foot: Secondary | ICD-10-CM

## 2019-02-28 DIAGNOSIS — R234 Changes in skin texture: Secondary | ICD-10-CM | POA: Diagnosis not present

## 2019-02-28 DIAGNOSIS — R0989 Other specified symptoms and signs involving the circulatory and respiratory systems: Secondary | ICD-10-CM | POA: Diagnosis not present

## 2019-02-28 DIAGNOSIS — S90112A Contusion of left great toe without damage to nail, initial encounter: Secondary | ICD-10-CM | POA: Diagnosis not present

## 2019-02-28 DIAGNOSIS — M21371 Foot drop, right foot: Secondary | ICD-10-CM | POA: Diagnosis not present

## 2019-02-28 DIAGNOSIS — M21372 Foot drop, left foot: Secondary | ICD-10-CM

## 2019-02-28 MED ORDER — MUPIROCIN 2 % EX OINT: 1.0000 "application " | TOPICAL_OINTMENT | Freq: Two times a day (BID) | CUTANEOUS | 2 refills | Status: DC

## 2019-02-28 MED ORDER — DICLOFENAC SODIUM 1 % EX GEL: 2.0000 g | Freq: Four times a day (QID) | CUTANEOUS | 0 refills | Status: DC

## 2019-02-28 NOTE — Patient Instructions (Signed)

## 2019-03-03 ENCOUNTER — Telehealth: Payer: Self-pay | Admitting: *Deleted

## 2019-03-03 DIAGNOSIS — M62472 Contracture of muscle, left ankle and foot: Secondary | ICD-10-CM | POA: Diagnosis not present

## 2019-03-03 DIAGNOSIS — R278 Other lack of coordination: Secondary | ICD-10-CM | POA: Diagnosis not present

## 2019-03-03 DIAGNOSIS — M6250 Muscle wasting and atrophy, not elsewhere classified, unspecified site: Secondary | ICD-10-CM | POA: Diagnosis not present

## 2019-03-03 DIAGNOSIS — M6281 Muscle weakness (generalized): Secondary | ICD-10-CM | POA: Diagnosis not present

## 2019-03-03 DIAGNOSIS — M79672 Pain in left foot: Secondary | ICD-10-CM | POA: Diagnosis not present

## 2019-03-03 DIAGNOSIS — R0989 Other specified symptoms and signs involving the circulatory and respiratory systems: Secondary | ICD-10-CM

## 2019-03-03 DIAGNOSIS — M2559 Pain in other specified joint: Secondary | ICD-10-CM | POA: Diagnosis not present

## 2019-03-03 DIAGNOSIS — R262 Difficulty in walking, not elsewhere classified: Secondary | ICD-10-CM | POA: Diagnosis not present

## 2019-03-03 NOTE — Telephone Encounter (Signed)
-----   Message from Trula Slade, DPM sent at 03/01/2019  7:14 AM EST ----- Can you please order arterial studies? Thanks.

## 2019-03-05 ENCOUNTER — Ambulatory Visit: Payer: Medicare Other | Attending: Internal Medicine

## 2019-03-05 DIAGNOSIS — Z23 Encounter for immunization: Secondary | ICD-10-CM

## 2019-03-05 NOTE — Progress Notes (Signed)
   Covid-19 Vaccination Clinic  Name:  Andrea Gilmore    MRN: [id number] DOB: [birth year]  03/05/2019  Andrea Gilmore was observed post Covid-19 immunization for 15 minutes without incidence. She was provided with Vaccine Information Sheet and instruction to access the V-Safe system.   Andrea Gilmore was instructed to call 911 with any severe reactions post vaccine: Marland Kitchen Difficulty breathing  . Swelling of your face and throat  . A fast heartbeat  . A bad rash all over your body  . Dizziness and weakness    Immunizations Administered    Name Date Dose VIS Date Route   Pfizer COVID-19 Vaccine 03/05/2019 11:52 AM 0.3 mL 12/17/2018 Intramuscular   Manufacturer: [place]   Lot: [id number]   [place]: [id number]

## 2019-03-07 DIAGNOSIS — M79672 Pain in left foot: Secondary | ICD-10-CM | POA: Diagnosis not present

## 2019-03-07 DIAGNOSIS — M6281 Muscle weakness (generalized): Secondary | ICD-10-CM | POA: Diagnosis not present

## 2019-03-07 DIAGNOSIS — R262 Difficulty in walking, not elsewhere classified: Secondary | ICD-10-CM | POA: Diagnosis not present

## 2019-03-07 DIAGNOSIS — M6250 Muscle wasting and atrophy, not elsewhere classified, unspecified site: Secondary | ICD-10-CM | POA: Diagnosis not present

## 2019-03-07 DIAGNOSIS — R278 Other lack of coordination: Secondary | ICD-10-CM | POA: Diagnosis not present

## 2019-03-07 DIAGNOSIS — M62472 Contracture of muscle, left ankle and foot: Secondary | ICD-10-CM | POA: Diagnosis not present

## 2019-03-07 DIAGNOSIS — M2559 Pain in other specified joint: Secondary | ICD-10-CM | POA: Diagnosis not present

## 2019-03-07 NOTE — Progress Notes (Signed)
Subjective:   Patient ID: Andrea Gilmore, female   DOB: 86 y.o.   MRN: [id number]   HPI 86 year old female presents the office today for concerns of pain to the bottom of her left heel which is on for last couple years and is worse in the morning when she first gets up.  She denies any recent injury or falls.  She does get cracked skin on her heel and she has noticed as splint in the left heel.  She also has a bruise to her left big toe but denies any injury.  This started a few weeks ago after there is a small blue line on the toe and it progressed into the what looks to be a bruise.  She said no recent treatment for any of these issues other than trying to treat herself.   Review of Systems  All other systems reviewed and are negative.  Past Medical History:  Diagnosis Date  . Arthritis   . Breast cancer ([place])    breast cancer  . Cardiomyopathy ([place])    a. possibly ischemic >> NSTEMI after should surgery in 07/2014 >>Echo 07/29/14:  EF 65%;  EF 08/01/14:  EF 20-25%, ant-septal AK, Gr 2 DD, mod MR, mod LAE, mod TR, PASP 63 mmHg  . Chronic systolic CHF (congestive heart failure) ([place])   . CKD (chronic kidney disease)   . Dementia ([place])   . History of echocardiogram    Echo 9/16:  Vigorous LVF, EF 65-70%, Gr 1 DD, mild MR, mild LAE, PASP 32 mmHg  . Hypothyroidism     Past Surgical History:  Procedure Laterality Date  . ABDOMINAL HYSTERECTOMY    . BACK SURGERY    . BILATERAL CARPAL TUNNEL RELEASE    . CHOLECYSTECTOMY    . EYE SURGERY Right   . KNEE SURGERY    . REVERSE SHOULDER ARTHROPLASTY Left 07/27/2014   Procedure: REVERSE SHOULDER ARTHROPLASTY;  Surgeon: Tania Ade, MD;  Location: [facility];  Service: Orthopedics;  Laterality: Left;  Left reverse total shoulder arthroplasty  . SHOULDER SURGERY Bilateral   . TONSILLECTOMY       Current Outpatient Medications:  .  acetaminophen (TYLENOL) 500 MG tablet, Take 500 mg by mouth every 6 (six) hours as needed for mild pain., Disp:  , Rfl:  .  cephALEXin (KEFLEX) 500 MG capsule, Take 1 capsule (500 mg total) by mouth 2 (two) times daily., Disp: 10 capsule, Rfl: 0 .  denosumab (PROLIA) 60 MG/ML SOSY injection, Inject 60 mg into the skin every 6 (six) months., Disp: , Rfl:  .  diazepam (VALIUM) 5 MG tablet, Take 5 mg by mouth at bedtime as needed for anxiety (sleep). , Disp: , Rfl: 0 .  latanoprost (XALATAN) 0.005 % ophthalmic solution, , Disp: , Rfl:  .  LUMIGAN 0.01 % SOLN, Place 1 drop into both eyes at bedtime. , Disp: , Rfl: 10 .  mirtazapine (REMERON) 45 MG tablet, Take 45 mg by mouth at bedtime. , Disp: , Rfl: 10 .  OLANZapine (ZYPREXA) 2.5 MG tablet, Take 2.5 mg by mouth every evening., Disp: , Rfl: 2 .  traMADol (ULTRAM) 50 MG tablet, Take 50 mg by mouth every 12 (twelve) hours as needed for moderate pain or severe pain. , Disp: , Rfl:  .  traMADol HCl 100 MG TABS, , Disp: , Rfl:  .  diclofenac Sodium (VOLTAREN) 1 % GEL, Apply 2 g topically 4 (four) times daily. Rub into affected area of foot 2 to  4 times daily, Disp: 100 g, Rfl: 0 .  mupirocin ointment (BACTROBAN) 2 %, Apply 1 application topically 2 (two) times daily., Disp: 30 g, Rfl: 2  Allergies  Allergen Reactions  . Metoprolol Tartrate Other (See Comments)    Per patient "felt like I was bouncing off the walls"  . Brimonidine Other (See Comments)    Other reaction(s): Lethargy (intolerance)  . Brinzolamide Other (See Comments)    Other reaction(s): Lethargy (intolerance)  . Codeine Rash  . Dorzolamide Hcl-Timolol Mal Other (See Comments)    Other reaction(s): Lethargy (intolerance)  . Blue Dyes (Parenteral) Itching  . Morphine And Related Rash and Other (See Comments)    Body spasms per daughter  . Red Dye Itching          Objective:  Physical Exam  General: AAO x3, NAD  Dermatological: Dry skin is present the left heel and there is a skin fissure present with a granular wound base to the left heel.  There is no drainage or pus.  There is no  fluctuation or crepitation and clinical signs of infection noted today.  Overall there is dry skin to the plantar aspect of the feet.  Vascular: Dorsalis Pedis artery and Posterior Tibial artery pedal pulses are 1/4 bilateral with immedate capillary fill time.  There is no pain with calf compression, swelling, warmth, erythema.   Neruologic: Grossly intact via light touch bilateral.  Negative Tinel sign.  Musculoskeletal: There is tenderness palpation on the plantar aspect of the left heel on insertion of plantar fascia.  There is no pain with lateral compression of the calcaneus.  No pain with Achilles tendon.  Thompson test is negative.  There is a bruise present on the left hallux but there is no tenderness palpation.  No other areas of discomfort identified currently.  Dropfoot present.  She currently has braces.  Gait: Unassisted, Nonantalgic.       Assessment:   86 year old female left heel pain, likely plantar fasciitis with skin fissure; contusion toe; PAD    Plan:  -Treatment options discussed including all alternatives, risks, and complications -Etiology of symptoms were discussed -X-rays were obtained and reviewed with the patient.  Also calcifications present.  There is no obvious signs of acute fracture identified at this time. -Regards the heel pain I dispensed a heel cup.  We discussed stretching, icing as well as wearing supportive shoes.  Physical therapy.  For the skin fissure we will do mupirocin ointment and monitor closely for any signs or symptoms of infection.  Given decreased pulses I am going to order arterial studies. -No obvious signs of fracture to the hallux there is no pain.  We will monitor the bruise.  ABI.  Return in about 3 weeks (around 03/21/2019) for heel pain.  Trula Slade DPM

## 2019-03-10 DIAGNOSIS — M6281 Muscle weakness (generalized): Secondary | ICD-10-CM | POA: Diagnosis not present

## 2019-03-10 DIAGNOSIS — M2559 Pain in other specified joint: Secondary | ICD-10-CM | POA: Diagnosis not present

## 2019-03-10 DIAGNOSIS — R278 Other lack of coordination: Secondary | ICD-10-CM | POA: Diagnosis not present

## 2019-03-10 DIAGNOSIS — M79672 Pain in left foot: Secondary | ICD-10-CM | POA: Diagnosis not present

## 2019-03-10 DIAGNOSIS — R262 Difficulty in walking, not elsewhere classified: Secondary | ICD-10-CM | POA: Diagnosis not present

## 2019-03-10 DIAGNOSIS — M62472 Contracture of muscle, left ankle and foot: Secondary | ICD-10-CM | POA: Diagnosis not present

## 2019-03-10 DIAGNOSIS — M6250 Muscle wasting and atrophy, not elsewhere classified, unspecified site: Secondary | ICD-10-CM | POA: Diagnosis not present

## 2019-03-14 DIAGNOSIS — R278 Other lack of coordination: Secondary | ICD-10-CM | POA: Diagnosis not present

## 2019-03-14 DIAGNOSIS — M6250 Muscle wasting and atrophy, not elsewhere classified, unspecified site: Secondary | ICD-10-CM | POA: Diagnosis not present

## 2019-03-14 DIAGNOSIS — M2559 Pain in other specified joint: Secondary | ICD-10-CM | POA: Diagnosis not present

## 2019-03-14 DIAGNOSIS — M79672 Pain in left foot: Secondary | ICD-10-CM | POA: Diagnosis not present

## 2019-03-14 DIAGNOSIS — R262 Difficulty in walking, not elsewhere classified: Secondary | ICD-10-CM | POA: Diagnosis not present

## 2019-03-14 DIAGNOSIS — M6281 Muscle weakness (generalized): Secondary | ICD-10-CM | POA: Diagnosis not present

## 2019-03-14 DIAGNOSIS — M62472 Contracture of muscle, left ankle and foot: Secondary | ICD-10-CM | POA: Diagnosis not present

## 2019-03-15 ENCOUNTER — Encounter (HOSPITAL_COMMUNITY): Payer: Medicare Other

## 2019-03-15 DIAGNOSIS — Z981 Arthrodesis status: Secondary | ICD-10-CM | POA: Diagnosis not present

## 2019-03-15 DIAGNOSIS — M5136 Other intervertebral disc degeneration, lumbar region: Secondary | ICD-10-CM | POA: Diagnosis not present

## 2019-03-15 DIAGNOSIS — M4316 Spondylolisthesis, lumbar region: Secondary | ICD-10-CM | POA: Diagnosis not present

## 2019-03-15 DIAGNOSIS — M47816 Spondylosis without myelopathy or radiculopathy, lumbar region: Secondary | ICD-10-CM | POA: Diagnosis not present

## 2019-03-15 DIAGNOSIS — M48062 Spinal stenosis, lumbar region with neurogenic claudication: Secondary | ICD-10-CM | POA: Diagnosis not present

## 2019-03-17 ENCOUNTER — Other Ambulatory Visit: Payer: Self-pay

## 2019-03-17 ENCOUNTER — Ambulatory Visit (HOSPITAL_COMMUNITY)
Admission: RE | Admit: 2019-03-17 | Discharge: 2019-03-17 | Disposition: A | Discharge status: Home / Self Care | Payer: Medicare Other | Source: Ambulatory Visit | Attending: Cardiology | Admitting: Cardiology

## 2019-03-17 DIAGNOSIS — R0989 Other specified symptoms and signs involving the circulatory and respiratory systems: Secondary | ICD-10-CM | POA: Diagnosis not present

## 2019-03-17 DIAGNOSIS — M6250 Muscle wasting and atrophy, not elsewhere classified, unspecified site: Secondary | ICD-10-CM | POA: Diagnosis not present

## 2019-03-17 DIAGNOSIS — M6281 Muscle weakness (generalized): Secondary | ICD-10-CM | POA: Diagnosis not present

## 2019-03-17 DIAGNOSIS — M2559 Pain in other specified joint: Secondary | ICD-10-CM | POA: Diagnosis not present

## 2019-03-17 DIAGNOSIS — M62472 Contracture of muscle, left ankle and foot: Secondary | ICD-10-CM | POA: Diagnosis not present

## 2019-03-17 DIAGNOSIS — R262 Difficulty in walking, not elsewhere classified: Secondary | ICD-10-CM | POA: Diagnosis not present

## 2019-03-17 DIAGNOSIS — R278 Other lack of coordination: Secondary | ICD-10-CM | POA: Diagnosis not present

## 2019-03-17 DIAGNOSIS — M79672 Pain in left foot: Secondary | ICD-10-CM | POA: Diagnosis not present

## 2019-03-18 ENCOUNTER — Telehealth: Payer: Self-pay | Admitting: *Deleted

## 2019-03-18 DIAGNOSIS — R0989 Other specified symptoms and signs involving the circulatory and respiratory systems: Secondary | ICD-10-CM

## 2019-03-18 NOTE — Telephone Encounter (Signed)
Called and spoke with Ms. Carlis Abbott and informed per Valerie's note that the circulation test did show decreased pulses and to follow up with Dr. Gwenlyn Found. Patient is scheduled for visit w/ Dr Jacqualyn Posey on Tuesday (03/16) to discuss  further details.

## 2019-03-18 NOTE — Telephone Encounter (Signed)
Left message requesting to call for results. 

## 2019-03-18 NOTE — Telephone Encounter (Signed)
-----   Message from Trula Slade, DPM sent at 03/18/2019  7:30 AM EST ----- Val- please let her know that the circulation test did show decreased pulses. Can you have her follow up with Dr. Gwenlyn Found? Thanks.

## 2019-03-18 NOTE — Addendum Note (Signed)
Addended by: Harriett Sine D on: 03/18/2019 04:46 PM   Modules accepted: Orders

## 2019-03-21 DIAGNOSIS — R278 Other lack of coordination: Secondary | ICD-10-CM | POA: Diagnosis not present

## 2019-03-21 DIAGNOSIS — M6281 Muscle weakness (generalized): Secondary | ICD-10-CM | POA: Diagnosis not present

## 2019-03-21 DIAGNOSIS — M79672 Pain in left foot: Secondary | ICD-10-CM | POA: Diagnosis not present

## 2019-03-21 DIAGNOSIS — M62472 Contracture of muscle, left ankle and foot: Secondary | ICD-10-CM | POA: Diagnosis not present

## 2019-03-21 DIAGNOSIS — M6250 Muscle wasting and atrophy, not elsewhere classified, unspecified site: Secondary | ICD-10-CM | POA: Diagnosis not present

## 2019-03-21 DIAGNOSIS — M2559 Pain in other specified joint: Secondary | ICD-10-CM | POA: Diagnosis not present

## 2019-03-21 DIAGNOSIS — R262 Difficulty in walking, not elsewhere classified: Secondary | ICD-10-CM | POA: Diagnosis not present

## 2019-03-22 ENCOUNTER — Encounter: Payer: Self-pay | Admitting: Podiatry

## 2019-03-22 ENCOUNTER — Ambulatory Visit (INDEPENDENT_AMBULATORY_CARE_PROVIDER_SITE_OTHER): Payer: Medicare Other | Admitting: Podiatry

## 2019-03-22 ENCOUNTER — Other Ambulatory Visit: Payer: Self-pay

## 2019-03-22 VITALS — Temp 97.4°F

## 2019-03-22 DIAGNOSIS — I739 Peripheral vascular disease, unspecified: Secondary | ICD-10-CM | POA: Diagnosis not present

## 2019-03-22 DIAGNOSIS — R234 Changes in skin texture: Secondary | ICD-10-CM

## 2019-03-25 ENCOUNTER — Ambulatory Visit: Payer: Medicare Other | Admitting: Cardiovascular Disease

## 2019-03-27 NOTE — Progress Notes (Signed)
Subjective: 86 year old female presents the office today for Pap evaluation of pain to her left heel, skin fissure.  Overall the area is doing better denies any drainage or pus.  No swelling or redness.  She also had arterial studies performed which were abnormal and she was scheduled to follow-up with Dr. Gwenlyn Found.  Denies any systemic complaints such as fevers, chills, nausea, vomiting. No acute changes since last appointment, and no other complaints at this time.   Objective: AAO x3, NAD DP/PT pulses palpable decreased To the posterior aspect left heel is a skin fissure which appears to be more superficial.  Very faint line of granulation tissue but there is no drainage or pus.  There is no fluctuation crepitation there is no surrounding erythema, ascending cellulitis.  Decreased tenderness to the area.  No other open lesions. No open lesions or pre-ulcerative lesions.  No pain with calf compression, swelling, warmth, erythema  Assessment: Skin fissure with some improvement  Plan: -All treatment options discussed with the patient including all alternatives, risks, complications.  -Sharply debrided the skin fissure without any complications utilizing 123XX123 with scalpel.  Continue with antibiotic ointment dressing changes daily.  She has a follow-up with Dr. Alvester Chou regards her circulation. Monitor for any clinical signs or symptoms of infection and directed to call the office immediately should any occur or go to the ER. -Patient encouraged to call the office with any questions, concerns, change in symptoms.   Return in about 3 weeks (around 04/12/2019).  Trula Slade DPM

## 2019-03-28 DIAGNOSIS — M62472 Contracture of muscle, left ankle and foot: Secondary | ICD-10-CM | POA: Diagnosis not present

## 2019-03-28 DIAGNOSIS — M79672 Pain in left foot: Secondary | ICD-10-CM | POA: Diagnosis not present

## 2019-03-28 DIAGNOSIS — R278 Other lack of coordination: Secondary | ICD-10-CM | POA: Diagnosis not present

## 2019-03-28 DIAGNOSIS — R262 Difficulty in walking, not elsewhere classified: Secondary | ICD-10-CM | POA: Diagnosis not present

## 2019-03-28 DIAGNOSIS — M2559 Pain in other specified joint: Secondary | ICD-10-CM | POA: Diagnosis not present

## 2019-03-28 DIAGNOSIS — M6250 Muscle wasting and atrophy, not elsewhere classified, unspecified site: Secondary | ICD-10-CM | POA: Diagnosis not present

## 2019-03-28 DIAGNOSIS — M6281 Muscle weakness (generalized): Secondary | ICD-10-CM | POA: Diagnosis not present

## 2019-03-30 ENCOUNTER — Ambulatory Visit: Payer: Medicare Other | Attending: Internal Medicine

## 2019-03-30 DIAGNOSIS — Z23 Encounter for immunization: Secondary | ICD-10-CM

## 2019-03-30 NOTE — Progress Notes (Signed)
   Covid-19 Vaccination Clinic  Name:  Andrea Gilmore    MRN: [id number] DOB: 10/07/1925  03/30/2019  [name] was observed post Covid-19 immunization for 15 minutes without incident. She was provided with Vaccine Information Sheet and instruction to access the V-Safe system.   [name] was instructed to call 911 with any severe reactions post vaccine: Marland Kitchen Difficulty breathing  . Swelling of face and throat  . A fast heartbeat  . A bad rash all over body  . Dizziness and weakness   Immunizations Administered    Name Date Dose VIS Date Route   Pfizer COVID-19 Vaccine 03/30/2019  2:40 PM 0.3 mL 12/17/2018 Intramuscular   Manufacturer: [place]   Lot: [id number]   [place]: [id number]

## 2019-04-04 DIAGNOSIS — M6250 Muscle wasting and atrophy, not elsewhere classified, unspecified site: Secondary | ICD-10-CM | POA: Diagnosis not present

## 2019-04-04 DIAGNOSIS — M79672 Pain in left foot: Secondary | ICD-10-CM | POA: Diagnosis not present

## 2019-04-04 DIAGNOSIS — M62472 Contracture of muscle, left ankle and foot: Secondary | ICD-10-CM | POA: Diagnosis not present

## 2019-04-04 DIAGNOSIS — M2559 Pain in other specified joint: Secondary | ICD-10-CM | POA: Diagnosis not present

## 2019-04-04 DIAGNOSIS — R278 Other lack of coordination: Secondary | ICD-10-CM | POA: Diagnosis not present

## 2019-04-04 DIAGNOSIS — R262 Difficulty in walking, not elsewhere classified: Secondary | ICD-10-CM | POA: Diagnosis not present

## 2019-04-04 DIAGNOSIS — M6281 Muscle weakness (generalized): Secondary | ICD-10-CM | POA: Diagnosis not present

## 2019-04-11 DIAGNOSIS — M6281 Muscle weakness (generalized): Secondary | ICD-10-CM | POA: Diagnosis not present

## 2019-04-11 DIAGNOSIS — M2559 Pain in other specified joint: Secondary | ICD-10-CM | POA: Diagnosis not present

## 2019-04-11 DIAGNOSIS — M79672 Pain in left foot: Secondary | ICD-10-CM | POA: Diagnosis not present

## 2019-04-11 DIAGNOSIS — M62472 Contracture of muscle, left ankle and foot: Secondary | ICD-10-CM | POA: Diagnosis not present

## 2019-04-11 DIAGNOSIS — R278 Other lack of coordination: Secondary | ICD-10-CM | POA: Diagnosis not present

## 2019-04-11 DIAGNOSIS — M6250 Muscle wasting and atrophy, not elsewhere classified, unspecified site: Secondary | ICD-10-CM | POA: Diagnosis not present

## 2019-04-11 DIAGNOSIS — R262 Difficulty in walking, not elsewhere classified: Secondary | ICD-10-CM | POA: Diagnosis not present

## 2019-04-12 ENCOUNTER — Ambulatory Visit (INDEPENDENT_AMBULATORY_CARE_PROVIDER_SITE_OTHER): Payer: Medicare Other | Admitting: Podiatry

## 2019-04-12 ENCOUNTER — Other Ambulatory Visit: Payer: Self-pay

## 2019-04-12 ENCOUNTER — Encounter: Payer: Self-pay | Admitting: Podiatry

## 2019-04-12 VITALS — Temp 97.3°F

## 2019-04-12 DIAGNOSIS — M79675 Pain in left toe(s): Secondary | ICD-10-CM | POA: Diagnosis not present

## 2019-04-12 DIAGNOSIS — B351 Tinea unguium: Secondary | ICD-10-CM | POA: Diagnosis not present

## 2019-04-12 DIAGNOSIS — M79674 Pain in right toe(s): Secondary | ICD-10-CM

## 2019-04-12 DIAGNOSIS — I739 Peripheral vascular disease, unspecified: Secondary | ICD-10-CM

## 2019-04-12 DIAGNOSIS — R234 Changes in skin texture: Secondary | ICD-10-CM

## 2019-04-12 DIAGNOSIS — Q828 Other specified congenital malformations of skin: Secondary | ICD-10-CM

## 2019-04-14 NOTE — Progress Notes (Signed)
Subjective: 86 year old female presents the office today for follow-up evaluation of pain to her left heel, skin fissure as well as asking for nails be trimmed today's are thickened elongated she cannot do them herself.  Area of the skin fissure, wound to left heel is doing much better.  No pain currently.  No redness or drainage or any swelling. Denies any systemic complaints such as fevers, chills, nausea, vomiting. No acute changes since last appointment, and no other complaints at this time.   Objective: AAO x3, NAD DP/PT pulses palpable decreased To the posterior aspect left heel is a skin fissure which is more of a callus today.  Upon debridement the wound appears to be healed.  There is no edema, erythema any signs of infection.  Nails are hypertrophic, dystrophic, brittle, discolored, elongated 10. No surrounding redness or drainage. Tenderness nails 1-5 bilaterally. No open lesions or pre-ulcerative lesions are identified today. No open lesions or pre-ulcerative lesions.  No pain with calf compression, swelling, warmth, erythema  Assessment: Skin fissure with improvement; symptomatic onychomycosis  Plan: -All treatment options discussed with the patient including all alternatives, risks, complications.  -Nails debrided x10 without any complications or bleeding. -Sharply debrided the skin fissure without any complications utilizing 123XX123 with scalpel.  Recommend moisturizer daily.  Monitor for any clinical signs or symptoms of infection and directed to call the office immediately should any occur or go to the ER. -Arterial studies did show decreased circulation.  The wound is healed.  There is still recommend follow-up with Dr. Alvester Chou as scheduled this week as her daughter would like her to be evaluated by a cardiologist in general.  -Patient encouraged to call the office with any questions, concerns, change in symptoms.   Trula Slade DPM

## 2019-04-15 ENCOUNTER — Encounter: Payer: Self-pay | Admitting: Cardiovascular Disease

## 2019-04-15 ENCOUNTER — Ambulatory Visit (INDEPENDENT_AMBULATORY_CARE_PROVIDER_SITE_OTHER): Payer: Medicare Other | Admitting: Cardiovascular Disease

## 2019-04-15 ENCOUNTER — Other Ambulatory Visit: Payer: Self-pay

## 2019-04-15 DIAGNOSIS — I998 Other disorder of circulatory system: Secondary | ICD-10-CM | POA: Diagnosis not present

## 2019-04-15 DIAGNOSIS — Z9889 Other specified postprocedural states: Secondary | ICD-10-CM

## 2019-04-15 DIAGNOSIS — Z959 Presence of cardiac and vascular implant and graft, unspecified: Secondary | ICD-10-CM

## 2019-04-15 DIAGNOSIS — I70229 Atherosclerosis of native arteries of extremities with rest pain, unspecified extremity: Secondary | ICD-10-CM | POA: Insufficient documentation

## 2019-04-15 NOTE — Assessment & Plan Note (Signed)
Andrea Gilmore was referred to me by Dr. Jacqualyn Posey, her podiatrist, for a slowly healing wound on her left heel.  This has since healed.  She walks with a walker but denies claudication.  Her recent Dopplers performed 03/18/2019 revealed noncompressible ABIs with widely patent vessels on the left down to the tibial vessels, occluded peroneal and posterior tibial artery.  No further work-up is required at this time.

## 2019-04-15 NOTE — Patient Instructions (Signed)
Medication Instructions:  Your physician recommends that you continue on your current medications as directed. Please refer to the Current Medication list given to you today.  Lab Work: NONE  Testing/Procedures: NONE  Follow-Up: At [facility], you and your health needs are our priority.  As part of our continuing mission to provide you with exceptional heart care, we have created designated Provider Care Teams.  These Care Teams include your primary Cardiologist (physician) and Advanced Practice Providers (APPs -  Physician Assistants and Nurse Practitioners) who all work together to provide you with the care you need, when you need it.  We recommend signing up for the patient portal called "MyChart".  Sign up information is provided on this After Visit Summary.  MyChart is used to connect with patients for Virtual Visits (Telemedicine).  Patients are able to view lab/test results, encounter notes, upcoming appointments, etc.  Non-urgent messages can be sent to your provider as well.   To learn more about what you can do with MyChart, go to [email or web address].    Your next appointment:   AS NEEDED with Dr. Gwenlyn Found

## 2019-04-15 NOTE — Progress Notes (Signed)
, °

## 2019-04-15 NOTE — Progress Notes (Signed)
04/15/2019 Andrea Gilmore   [birth year]  [id number]  Primary Physician Mayra Neer, MD Primary Cardiologist: Lorretta Harp MD Garret Reddish, [place], [place]  HPI:  Andrea Gilmore is a 86 y.o. thin and frail appearing widowed Caucasian female mother of 2 children, grandmother of 2 grandchildren who is accompanied by her daughter Jeani Hawking today.  She is referred by Dr. Jacqualyn Posey, her podiatrist for a slowly healing wound on her left heel.  She has seen Dr. Acie Fredrickson in the past for what was presumed to be a Takotsubo cardiomyopathy.  She has no cardiac risk factors.  She is never had a heart attack or stroke.  She denies chest pain or shortness of breath.  She lives with her daughter and walks every day up to half a mile without limitation with a walker.  She did have a left heel wound which has subsequently healed.  Dopplers performed 03/18/2019 revealed noncompressible tibial vessels/ABIs with patent vessels on the left down to the level of the knee and occluded peroneal and posterior tibial arteries.  Her foot is warm.  I cannot feel a dorsalis pedis pulse on exam on the left.  Her left heel wound has subsequently healed.   Current Meds  Medication Sig  . acetaminophen (TYLENOL) 500 MG tablet Take 500 mg by mouth every 6 (six) hours as needed for mild pain.  Marland Kitchen aspirin EC 81 MG tablet Take 81 mg by mouth daily.  . diclofenac Sodium (VOLTAREN) 1 % GEL Apply 2 g topically 4 (four) times daily. Rub into affected area of foot 2 to 4 times daily  . latanoprost (XALATAN) 0.005 % ophthalmic solution   . Levothyroxine Sodium (EUTHYROX PO) Take 25 mg by mouth.   . mupirocin ointment (BACTROBAN) 2 % Apply 1 application topically 2 (two) times daily.  Marland Kitchen OLANZAPINE PO Take by mouth.  . traMADol HCl 100 MG TABS      Allergies  Allergen Reactions  . Metoprolol Tartrate Other (See Comments)    Per patient "felt like I was bouncing off the walls"  . Brimonidine Other (See Comments)    Other reaction(s):  Lethargy (intolerance)  . Brinzolamide Other (See Comments)    Other reaction(s): Lethargy (intolerance)  . Codeine Rash  . Dorzolamide Hcl-Timolol Mal Other (See Comments)    Other reaction(s): Lethargy (intolerance)  . Blue Dyes (Parenteral) Itching  . Morphine And Related Rash and Other (See Comments)    Body spasms per daughter  . Red Dye Itching    Social History   Socioeconomic History  . Marital status: Widowed    Spouse name: Not on file  . Number of children: Not on file  . Years of education: Not on file  . Highest education level: Not on file  Occupational History  . Not on file  Tobacco Use  . Smoking status: Never Smoker  . Smokeless tobacco: Never Used  Substance and Sexual Activity  . Alcohol use: No  . Drug use: No  . Sexual activity: Not Currently  Other Topics Concern  . Not on file  Social History Narrative  . Not on file   Social Determinants of Health   Financial Resource Strain:   . Difficulty of Paying Living Expenses:   Food Insecurity:   . Worried About Charity fundraiser in the Last Year:   . Arboriculturist in the Last Year:   Transportation Needs:   . Film/video editor (Medical):   Marland Kitchen Lack  of Transportation (Non-Medical):   Physical Activity:   . Days of Exercise per Week:   . Minutes of Exercise per Session:   Stress:   . Feeling of Stress :   Social Connections:   . Frequency of Communication with Friends and Family:   . Frequency of Social Gatherings with Friends and Family:   . Attends Religious Services:   . Active Member of Clubs or Organizations:   . Attends Archivist Meetings:   Marland Kitchen Marital Status:   Intimate Partner Violence:   . Fear of Current or Ex-Partner:   . Emotionally Abused:   Marland Kitchen Physically Abused:   . Sexually Abused:      Review of Systems: General: negative for chills, fever, night sweats or weight changes.  Cardiovascular: negative for chest pain, dyspnea on exertion, edema, orthopnea,  palpitations, paroxysmal nocturnal dyspnea or shortness of breath Dermatological: negative for rash Respiratory: negative for cough or wheezing Urologic: negative for hematuria Abdominal: negative for nausea, vomiting, diarrhea, bright red blood per rectum, melena, or hematemesis Neurologic: negative for visual changes, syncope, or dizziness All other systems reviewed and are otherwise negative except as noted above.    Blood pressure 110/60, pulse 73, height 5' (1.524 m), weight 98 lb (44.5 kg), SpO2 95 %.  General appearance: alert and no distress Neck: no adenopathy, no carotid bruit, no JVD, supple, symmetrical, trachea midline and thyroid not enlarged, symmetric, no tenderness/mass/nodules Lungs: clear to auscultation bilaterally Heart: regular rate and rhythm, S1, S2 normal, no murmur, click, rub or gallop Extremities: extremities normal, atraumatic, no cyanosis or edema Pulses: Diminished pedal pulses bilaterally Skin: Skin color, texture, turgor normal. No rashes or lesions Neurologic: Alert and oriented X 3, normal strength and tone. Normal symmetric reflexes. Normal coordination and gait  EKG sinus rhythm at 73 with left ventricular hypertrophy and left anterior fascicular block.  I personally reviewed this EKG.  ASSESSMENT AND PLAN:   Critical limb ischemia with history of revascularization of same extremity Ms. Razzano was referred to me by Dr. Jacqualyn Posey, her podiatrist, for a slowly healing wound on her left heel.  This has since healed.  She walks with a walker but denies claudication.  Her recent Dopplers performed 03/18/2019 revealed noncompressible ABIs with widely patent vessels on the left down to the tibial vessels, occluded peroneal and posterior tibial artery.  No further work-up is required at this time.      Lorretta Harp MD FACP,FACC,FAHA, [facility] 04/15/2019 1:50 PM

## 2019-04-18 DIAGNOSIS — R262 Difficulty in walking, not elsewhere classified: Secondary | ICD-10-CM | POA: Diagnosis not present

## 2019-04-18 DIAGNOSIS — M79672 Pain in left foot: Secondary | ICD-10-CM | POA: Diagnosis not present

## 2019-04-18 DIAGNOSIS — M6250 Muscle wasting and atrophy, not elsewhere classified, unspecified site: Secondary | ICD-10-CM | POA: Diagnosis not present

## 2019-04-18 DIAGNOSIS — M2559 Pain in other specified joint: Secondary | ICD-10-CM | POA: Diagnosis not present

## 2019-04-18 DIAGNOSIS — R278 Other lack of coordination: Secondary | ICD-10-CM | POA: Diagnosis not present

## 2019-04-18 DIAGNOSIS — M6281 Muscle weakness (generalized): Secondary | ICD-10-CM | POA: Diagnosis not present

## 2019-04-18 DIAGNOSIS — M62472 Contracture of muscle, left ankle and foot: Secondary | ICD-10-CM | POA: Diagnosis not present

## 2019-04-21 DIAGNOSIS — M48061 Spinal stenosis, lumbar region without neurogenic claudication: Secondary | ICD-10-CM | POA: Diagnosis not present

## 2019-04-21 DIAGNOSIS — M4726 Other spondylosis with radiculopathy, lumbar region: Secondary | ICD-10-CM | POA: Diagnosis not present

## 2019-04-21 DIAGNOSIS — M5116 Intervertebral disc disorders with radiculopathy, lumbar region: Secondary | ICD-10-CM | POA: Diagnosis not present

## 2019-04-25 DIAGNOSIS — M6250 Muscle wasting and atrophy, not elsewhere classified, unspecified site: Secondary | ICD-10-CM | POA: Diagnosis not present

## 2019-04-25 DIAGNOSIS — M62472 Contracture of muscle, left ankle and foot: Secondary | ICD-10-CM | POA: Diagnosis not present

## 2019-04-25 DIAGNOSIS — M2559 Pain in other specified joint: Secondary | ICD-10-CM | POA: Diagnosis not present

## 2019-04-25 DIAGNOSIS — R278 Other lack of coordination: Secondary | ICD-10-CM | POA: Diagnosis not present

## 2019-04-25 DIAGNOSIS — M79672 Pain in left foot: Secondary | ICD-10-CM | POA: Diagnosis not present

## 2019-04-25 DIAGNOSIS — R262 Difficulty in walking, not elsewhere classified: Secondary | ICD-10-CM | POA: Diagnosis not present

## 2019-04-25 DIAGNOSIS — M6281 Muscle weakness (generalized): Secondary | ICD-10-CM | POA: Diagnosis not present

## 2019-05-02 DIAGNOSIS — Z8673 Personal history of transient ischemic attack (TIA), and cerebral infarction without residual deficits: Secondary | ICD-10-CM | POA: Diagnosis not present

## 2019-05-02 DIAGNOSIS — D692 Other nonthrombocytopenic purpura: Secondary | ICD-10-CM | POA: Diagnosis not present

## 2019-05-02 DIAGNOSIS — E039 Hypothyroidism, unspecified: Secondary | ICD-10-CM | POA: Diagnosis not present

## 2019-05-02 DIAGNOSIS — I10 Essential (primary) hypertension: Secondary | ICD-10-CM | POA: Diagnosis not present

## 2019-05-02 DIAGNOSIS — M810 Age-related osteoporosis without current pathological fracture: Secondary | ICD-10-CM | POA: Diagnosis not present

## 2019-05-02 DIAGNOSIS — M549 Dorsalgia, unspecified: Secondary | ICD-10-CM | POA: Diagnosis not present

## 2019-05-02 DIAGNOSIS — D649 Anemia, unspecified: Secondary | ICD-10-CM | POA: Diagnosis not present

## 2019-05-02 DIAGNOSIS — M4800 Spinal stenosis, site unspecified: Secondary | ICD-10-CM | POA: Diagnosis not present

## 2019-05-02 DIAGNOSIS — K219 Gastro-esophageal reflux disease without esophagitis: Secondary | ICD-10-CM | POA: Diagnosis not present

## 2019-05-02 DIAGNOSIS — Z0000 Encounter for general adult medical examination without abnormal findings: Secondary | ICD-10-CM | POA: Diagnosis not present

## 2019-05-02 DIAGNOSIS — M1990 Unspecified osteoarthritis, unspecified site: Secondary | ICD-10-CM | POA: Diagnosis not present

## 2019-05-02 DIAGNOSIS — E46 Unspecified protein-calorie malnutrition: Secondary | ICD-10-CM | POA: Diagnosis not present

## 2019-05-03 DIAGNOSIS — M6250 Muscle wasting and atrophy, not elsewhere classified, unspecified site: Secondary | ICD-10-CM | POA: Diagnosis not present

## 2019-05-03 DIAGNOSIS — M62472 Contracture of muscle, left ankle and foot: Secondary | ICD-10-CM | POA: Diagnosis not present

## 2019-05-03 DIAGNOSIS — R278 Other lack of coordination: Secondary | ICD-10-CM | POA: Diagnosis not present

## 2019-05-03 DIAGNOSIS — M2559 Pain in other specified joint: Secondary | ICD-10-CM | POA: Diagnosis not present

## 2019-05-03 DIAGNOSIS — M79672 Pain in left foot: Secondary | ICD-10-CM | POA: Diagnosis not present

## 2019-05-03 DIAGNOSIS — M6281 Muscle weakness (generalized): Secondary | ICD-10-CM | POA: Diagnosis not present

## 2019-05-03 DIAGNOSIS — R262 Difficulty in walking, not elsewhere classified: Secondary | ICD-10-CM | POA: Diagnosis not present

## 2019-05-11 DIAGNOSIS — M6281 Muscle weakness (generalized): Secondary | ICD-10-CM | POA: Diagnosis not present

## 2019-05-11 DIAGNOSIS — M2559 Pain in other specified joint: Secondary | ICD-10-CM | POA: Diagnosis not present

## 2019-05-11 DIAGNOSIS — R262 Difficulty in walking, not elsewhere classified: Secondary | ICD-10-CM | POA: Diagnosis not present

## 2019-05-11 DIAGNOSIS — R278 Other lack of coordination: Secondary | ICD-10-CM | POA: Diagnosis not present

## 2019-05-11 DIAGNOSIS — M62472 Contracture of muscle, left ankle and foot: Secondary | ICD-10-CM | POA: Diagnosis not present

## 2019-05-11 DIAGNOSIS — M6250 Muscle wasting and atrophy, not elsewhere classified, unspecified site: Secondary | ICD-10-CM | POA: Diagnosis not present

## 2019-05-11 DIAGNOSIS — M79672 Pain in left foot: Secondary | ICD-10-CM | POA: Diagnosis not present

## 2019-05-13 DIAGNOSIS — Z981 Arthrodesis status: Secondary | ICD-10-CM | POA: Diagnosis not present

## 2019-05-13 DIAGNOSIS — M48062 Spinal stenosis, lumbar region with neurogenic claudication: Secondary | ICD-10-CM | POA: Diagnosis not present

## 2019-05-13 DIAGNOSIS — M5416 Radiculopathy, lumbar region: Secondary | ICD-10-CM | POA: Diagnosis not present

## 2019-05-13 DIAGNOSIS — M47816 Spondylosis without myelopathy or radiculopathy, lumbar region: Secondary | ICD-10-CM | POA: Diagnosis not present

## 2019-05-16 DIAGNOSIS — R262 Difficulty in walking, not elsewhere classified: Secondary | ICD-10-CM | POA: Diagnosis not present

## 2019-05-16 DIAGNOSIS — M79672 Pain in left foot: Secondary | ICD-10-CM | POA: Diagnosis not present

## 2019-05-16 DIAGNOSIS — M62472 Contracture of muscle, left ankle and foot: Secondary | ICD-10-CM | POA: Diagnosis not present

## 2019-05-16 DIAGNOSIS — M2559 Pain in other specified joint: Secondary | ICD-10-CM | POA: Diagnosis not present

## 2019-05-16 DIAGNOSIS — M6250 Muscle wasting and atrophy, not elsewhere classified, unspecified site: Secondary | ICD-10-CM | POA: Diagnosis not present

## 2019-05-16 DIAGNOSIS — R278 Other lack of coordination: Secondary | ICD-10-CM | POA: Diagnosis not present

## 2019-05-16 DIAGNOSIS — M6281 Muscle weakness (generalized): Secondary | ICD-10-CM | POA: Diagnosis not present

## 2019-05-17 ENCOUNTER — Encounter: Payer: Self-pay | Admitting: Orthopaedic Surgery

## 2019-05-17 ENCOUNTER — Other Ambulatory Visit: Payer: Self-pay

## 2019-05-17 ENCOUNTER — Ambulatory Visit (INDEPENDENT_AMBULATORY_CARE_PROVIDER_SITE_OTHER): Payer: Medicare Other | Admitting: Orthopaedic Surgery

## 2019-05-17 VITALS — Ht 60.0 in | Wt 98.0 lb

## 2019-05-17 DIAGNOSIS — M19011 Primary osteoarthritis, right shoulder: Secondary | ICD-10-CM | POA: Diagnosis not present

## 2019-05-17 MED ORDER — METHYLPREDNISOLONE ACETATE 40 MG/ML IJ SUSP
80.0000 mg | INTRAMUSCULAR | Status: AC | PRN
  Administered 2019-05-17: 80 mg via INTRA_ARTICULAR

## 2019-05-17 MED ORDER — LIDOCAINE HCL 1 % IJ SOLN
2.0000 mL | INTRAMUSCULAR | Status: AC | PRN
  Administered 2019-05-17: 2 mL

## 2019-05-17 MED ORDER — BUPIVACAINE HCL 0.5 % IJ SOLN
2.0000 mL | INTRAMUSCULAR | Status: AC | PRN
  Administered 2019-05-17: 2 mL via INTRA_ARTICULAR

## 2019-05-17 NOTE — Progress Notes (Signed)
Office Visit Note   Patient: Andrea Gilmore           Date of Birth: [birth year]           MRN: [id number] Visit Date: 05/17/2019              Requested by: Mayra Neer, MD [address] [place],  [place] [address] PCP: Mayra Neer, MD   Assessment & Plan: Visit Diagnoses:  1. Primary osteoarthritis, right shoulder     Plan: Rotator cuff arthropathy right shoulder with recent injury.  Has small hematoma.  Have aspirated a small amount of blood and injected her shoulder with Depo-Medrol.  We will see her back in 2 weeks to reassess her shoulder and to consider injecting the arthritic right knee  Follow-Up Instructions: Return in about 2 weeks (around 05/31/2019).   Orders:  Orders Placed This Encounter  Procedures  . Large Joint Inj: R glenohumeral   No orders of the defined types were placed in this encounter.     Procedures: Large Joint Inj: R glenohumeral on 05/17/2019 4:23 PM Indications: pain and diagnostic evaluation Details: 25 G 1.5 in needle, posterior approach  Arthrogram: No  Medications: 2 mL lidocaine 1 %; 2 mL bupivacaine 0.5 %; 80 mg methylPREDNISolone acetate 40 MG/ML Consent was given by the patient. Immediately prior to procedure a time out was called to verify the correct patient, procedure, equipment, support staff and site/side marked as required. Patient was prepped and draped in the usual sterile fashion.       Clinical Data: No additional findings.   Subjective: Chief Complaint  Patient presents with  . Right Knee - Pain  . Right Shoulder - Pain  Patient presents today for right shoulder and right knee pain. She is wanting to get cortisone injections today. Her last shoulder injection was in December and the last knee injection was in March of last year. She takes tramadol for pain and a tylenol twice daily. She is not diabetic. Had a recent fall and injured her right shoulder.  Has a bruise but no significant pain  other than her "arthritic pain".  HPI  Review of Systems   Objective: Vital Signs: Ht 5' (1.524 m)   Wt 98 lb (44.5 kg)   BMI 19.14 kg/m   Physical Exam Constitutional:      Appearance: She is well-developed.  Eyes:     Pupils: Pupils are equal, round, and reactive to light.  Pulmonary:     Effort: Pulmonary effort is normal.  Skin:    General: Skin is warm and dry.  Neurological:     Mental Status: She is alert and oriented to person, place, and time.  Psychiatric:        Behavior: Behavior normal.     Ortho Exam accompanied by her daughter.  Uses a walker for ambulation.  Considerable grinding and crepitation with range of motion of right shoulder which is also quite limited.  Has some bruising anteriorly from her recent fall and probably has a small hematoma.  I did aspirate a small hematoma and then injected her shoulder with cortisone and she felt much better .has had prior films consistent with the arthritis related to rotator cuff arthropathy.  Had prior reverse shoulder arthroplasty on the left for same problem Specialty Comments:  No specialty comments available.  Imaging: No results found.   PMFS History: Patient Active Problem List   Diagnosis Date Noted  . Critical limb ischemia with  history of revascularization of same extremity 04/15/2019  . Primary osteoarthritis, right shoulder 08/03/2018  . Unilateral primary osteoarthritis, right knee 01/12/2017  . Takotsubo syndrome 12/08/2014  . S/p reverse total shoulder arthroplasty   . Cardiomyopathy ([place])   . Iron deficiency anemia   . Aspiration pneumonia due to inhalation of milk ([place])   . Secondary cardiomyopathy ([place])   . Acute cystitis without hematuria   . Hypoxia   . Blood poisoning   . Aspiration pneumonia due to food (regurgitated) ([place])   . Chronic kidney disease, stage III (moderate)   . UTI (lower urinary tract infection)   . Anemia, iron deficiency   . Acute blood loss anemia   . Acute  encephalopathy   . Protein-calorie malnutrition, severe ([place]) 08/01/2014  . Acute systolic heart failure ([place])   . Respiratory failure ([place])   . NSTEMI (non-ST elevated myocardial infarction) ([place])   . Protein-calorie malnutrition ([place])   . Other specified hypothyroidism   . Acute pulmonary edema (HCC)   . Pleural effusion, left   . CKD (chronic kidney disease), stage III 07/28/2014  . Metabolic encephalopathy Q000111Q  . Acute delirium 07/28/2014  . Fever 07/28/2014  . Acute respiratory failure with hypoxia ([place]) 07/28/2014  . Metabolic acidosis Q000111Q  . Sepsis ([place]) 07/28/2014  . Left rotator cuff tear arthropathy 07/27/2014  . History of iron deficiency anemia 04/14/2013  . Acute renal failure ([place]) 04/14/2013  . Primary open angle glaucoma 04/08/2012  . History of breast cancer 06/17/2011   Past Medical History:  Diagnosis Date  . Arthritis   . Breast cancer ([place])    breast cancer  . Cardiomyopathy ([place])    a. possibly ischemic >> NSTEMI after should surgery in 07/2014 >>Echo 07/29/14:  EF 65%;  EF 08/01/14:  EF 20-25%, ant-septal AK, Gr 2 DD, mod MR, mod LAE, mod TR, PASP 63 mmHg  . Chronic systolic CHF (congestive heart failure) ([place])   . CKD (chronic kidney disease)   . Dementia ([place])   . History of echocardiogram    Echo 9/16:  Vigorous LVF, EF 65-70%, Gr 1 DD, mild MR, mild LAE, PASP 32 mmHg  . Hypothyroidism     Family History  Problem Relation Age of Onset  . Brain cancer Mother   . Cancer - Lung Father     Past Surgical History:  Procedure Laterality Date  . ABDOMINAL HYSTERECTOMY    . BACK SURGERY    . BILATERAL CARPAL TUNNEL RELEASE    . CHOLECYSTECTOMY    . EYE SURGERY Right   . KNEE SURGERY    . REVERSE SHOULDER ARTHROPLASTY Left 07/27/2014   Procedure: REVERSE SHOULDER ARTHROPLASTY;  Surgeon: Tania Ade, MD;  Location: [facility];  Service: Orthopedics;  Laterality: Left;  Left reverse total shoulder arthroplasty  . SHOULDER SURGERY Bilateral     . TONSILLECTOMY     Social History   Occupational History  . Not on file  Tobacco Use  . Smoking status: Never Smoker  . Smokeless tobacco: Never Used  Substance and Sexual Activity  . Alcohol use: No  . Drug use: No  . Sexual activity: Not Currently

## 2019-05-23 DIAGNOSIS — M79672 Pain in left foot: Secondary | ICD-10-CM | POA: Diagnosis not present

## 2019-05-23 DIAGNOSIS — M2559 Pain in other specified joint: Secondary | ICD-10-CM | POA: Diagnosis not present

## 2019-05-23 DIAGNOSIS — R262 Difficulty in walking, not elsewhere classified: Secondary | ICD-10-CM | POA: Diagnosis not present

## 2019-05-23 DIAGNOSIS — R278 Other lack of coordination: Secondary | ICD-10-CM | POA: Diagnosis not present

## 2019-05-23 DIAGNOSIS — M6281 Muscle weakness (generalized): Secondary | ICD-10-CM | POA: Diagnosis not present

## 2019-05-23 DIAGNOSIS — M6250 Muscle wasting and atrophy, not elsewhere classified, unspecified site: Secondary | ICD-10-CM | POA: Diagnosis not present

## 2019-05-23 DIAGNOSIS — M62472 Contracture of muscle, left ankle and foot: Secondary | ICD-10-CM | POA: Diagnosis not present

## 2019-05-30 DIAGNOSIS — R278 Other lack of coordination: Secondary | ICD-10-CM | POA: Diagnosis not present

## 2019-05-30 DIAGNOSIS — R262 Difficulty in walking, not elsewhere classified: Secondary | ICD-10-CM | POA: Diagnosis not present

## 2019-05-30 DIAGNOSIS — M6281 Muscle weakness (generalized): Secondary | ICD-10-CM | POA: Diagnosis not present

## 2019-05-30 DIAGNOSIS — M62472 Contracture of muscle, left ankle and foot: Secondary | ICD-10-CM | POA: Diagnosis not present

## 2019-05-30 DIAGNOSIS — M2559 Pain in other specified joint: Secondary | ICD-10-CM | POA: Diagnosis not present

## 2019-05-30 DIAGNOSIS — M79672 Pain in left foot: Secondary | ICD-10-CM | POA: Diagnosis not present

## 2019-05-30 DIAGNOSIS — M6250 Muscle wasting and atrophy, not elsewhere classified, unspecified site: Secondary | ICD-10-CM | POA: Diagnosis not present

## 2019-05-31 ENCOUNTER — Encounter: Payer: Self-pay | Admitting: Orthopaedic Surgery

## 2019-05-31 ENCOUNTER — Other Ambulatory Visit: Payer: Self-pay

## 2019-05-31 ENCOUNTER — Ambulatory Visit (INDEPENDENT_AMBULATORY_CARE_PROVIDER_SITE_OTHER): Payer: Medicare Other | Admitting: Orthopaedic Surgery

## 2019-05-31 VITALS — Ht 60.0 in | Wt 98.0 lb

## 2019-05-31 DIAGNOSIS — M1711 Unilateral primary osteoarthritis, right knee: Secondary | ICD-10-CM

## 2019-05-31 MED ORDER — LIDOCAINE HCL 1 % IJ SOLN
2.0000 mL | INTRAMUSCULAR | Status: AC | PRN
  Administered 2019-05-31: 2 mL

## 2019-05-31 MED ORDER — METHYLPREDNISOLONE ACETATE 40 MG/ML IJ SUSP
80.0000 mg | INTRAMUSCULAR | Status: AC | PRN
  Administered 2019-05-31: 80 mg via INTRA_ARTICULAR

## 2019-05-31 MED ORDER — BUPIVACAINE HCL 0.5 % IJ SOLN
2.0000 mL | INTRAMUSCULAR | Status: AC | PRN
  Administered 2019-05-31: 2 mL via INTRA_ARTICULAR

## 2019-05-31 NOTE — Progress Notes (Signed)
Office Visit Note   Patient: Andrea Gilmore           Date of Birth: 01-Mar-1925           MRN: [id number] Visit Date: 05/31/2019              Requested by: Mayra Neer, MD [address] [place],  [place] [address] PCP: Mayra Neer, MD   Assessment & Plan: Visit Diagnoses:  1. Unilateral primary osteoarthritis, right knee     Plan: Repeat cortisone injection right knee.  Has significant osteoarthritis with multiple comorbidities that would preclude surgery  Follow-Up Instructions: Return if symptoms worsen or fail to improve.   Orders:  Orders Placed This Encounter  Procedures  . Large Joint Inj: R knee   No orders of the defined types were placed in this encounter.     Procedures: Large Joint Inj: R knee on 05/31/2019 3:45 PM Indications: pain and diagnostic evaluation Details: 25 G 1.5 in needle, anteromedial approach  Arthrogram: No  Medications: 2 mL lidocaine 1 %; 2 mL bupivacaine 0.5 %; 80 mg methylPREDNISolone acetate 40 MG/ML Procedure, treatment alternatives, risks and benefits explained, specific risks discussed. Consent was given by the patient. Immediately prior to procedure a time out was called to verify the correct patient, procedure, equipment, support staff and site/side marked as required. Patient was prepped and draped in the usual sterile fashion.       Clinical Data: No additional findings.   Subjective: Chief Complaint  Patient presents with  . Right Shoulder - Pain  Patient presents today for follow up on her right shoulder. She was here on 05/17/2019 and received a cortisone injection. She states that the injectioned helped a little. She is here today in hopes of getting a right knee cortisone injection. She is taking Tramadol twice daily, and Tylenol twice daily.  HPI  Review of Systems  Constitutional: Negative for fatigue.  Eyes: Negative for pain.  Respiratory: Negative for shortness of breath.     Cardiovascular: Negative for leg swelling.  Gastrointestinal: Positive for constipation. Negative for diarrhea.  Endocrine: Negative for cold intolerance and heat intolerance.  Genitourinary: Positive for difficulty urinating.  Musculoskeletal: Negative for joint swelling.  Skin: Negative for rash.  Allergic/Immunologic: Negative for food allergies.  Neurological: Positive for weakness.  Hematological: Bruises/bleeds easily.  Psychiatric/Behavioral: Negative for sleep disturbance.     Objective: Vital Signs: Ht 5' (1.524 m)   Wt 98 lb (44.5 kg)   BMI 19.14 kg/m   Physical Exam Constitutional:      Appearance: She is well-developed.  Eyes:     Pupils: Pupils are equal, round, and reactive to light.  Pulmonary:     Effort: Pulmonary effort is normal.  Skin:    General: Skin is warm and dry.  Neurological:     Mental Status: She is alert and oriented to person, place, and time.  Psychiatric:        Behavior: Behavior normal.     Ortho Exam awake alert and oriented x3.  Comfortable sitting.  Accompanied by her daughter.  Uses a walker for ambulation.  Right knee without effusion.  Positive crepitation medially.  Lacks a few degrees to full extension flexed over 105 degrees without instability.  Knee was not hot warm or red.  Considerable crepitation with any motion of her right shoulder consistent with a rotator cuff arthropathy.  Little if any effusion compared to her pre  cortisone injection several weeks ago  Specialty Comments:  No specialty comments available.  Imaging: No results found.   PMFS History: Patient Active Problem List   Diagnosis Date Noted  . Critical limb ischemia with history of revascularization of same extremity 04/15/2019  . Primary osteoarthritis, right shoulder 08/03/2018  . Unilateral primary osteoarthritis, right knee 01/12/2017  . Takotsubo syndrome 12/08/2014  . S/p reverse total shoulder arthroplasty   . Cardiomyopathy ([place])   . Iron  deficiency anemia   . Aspiration pneumonia due to inhalation of milk ([place])   . Secondary cardiomyopathy ([place])   . Acute cystitis without hematuria   . Hypoxia   . Blood poisoning   . Aspiration pneumonia due to food (regurgitated) ([place])   . Chronic kidney disease, stage III (moderate)   . UTI (lower urinary tract infection)   . Anemia, iron deficiency   . Acute blood loss anemia   . Acute encephalopathy   . Protein-calorie malnutrition, severe ([place]) 08/01/2014  . Acute systolic heart failure ([place] [place])   . Respiratory failure ([place])   . NSTEMI (non-ST elevated myocardial infarction) ([facility])   . Protein-calorie malnutrition ([place])   . Other specified hypothyroidism   . Acute pulmonary edema (HCC)   . Pleural effusion, left   . CKD (chronic kidney disease), stage III 07/28/2014  . Metabolic encephalopathy Q000111Q  . Acute delirium 07/28/2014  . Fever 07/28/2014  . Acute respiratory failure with hypoxia ([place]) 07/28/2014  . Metabolic acidosis Q000111Q  . Sepsis ([place]) 07/28/2014  . Left rotator cuff tear arthropathy 07/27/2014  . History of iron deficiency anemia 04/14/2013  . Acute renal failure ([place]) 04/14/2013  . Primary open angle glaucoma 04/08/2012  . History of breast cancer 06/17/2011   Past Medical History:  Diagnosis Date  . Arthritis   . Breast cancer ([place])    breast cancer  . Cardiomyopathy ([place])    a. possibly ischemic >> NSTEMI after should surgery in 07/2014 >>Echo 07/29/14:  EF 65%;  EF 08/01/14:  EF 20-25%, ant-septal AK, Gr 2 DD, mod MR, mod LAE, mod TR, PASP 63 mmHg  . Chronic systolic CHF (congestive heart failure) ([facility])   . CKD (chronic kidney disease)   . Dementia ([place])   . History of echocardiogram    Echo 9/16:  Vigorous LVF, EF 65-70%, Gr 1 DD, mild MR, mild LAE, PASP 32 mmHg  . Hypothyroidism     Family History  Problem Relation Age of Onset  . Brain cancer Mother   . Cancer - Lung Father     Past Surgical History:  Procedure Laterality Date  .  ABDOMINAL HYSTERECTOMY    . BACK SURGERY    . BILATERAL CARPAL TUNNEL RELEASE    . CHOLECYSTECTOMY    . EYE SURGERY Right   . KNEE SURGERY    . REVERSE SHOULDER ARTHROPLASTY Left 07/27/2014   Procedure: REVERSE SHOULDER ARTHROPLASTY;  Surgeon: Tania Ade, MD;  Location: [facility];  Service: Orthopedics;  Laterality: Left;  Left reverse total shoulder arthroplasty  . SHOULDER SURGERY Bilateral   . TONSILLECTOMY     Social History   Occupational History  . Not on file  Tobacco Use  . Smoking status: Never Smoker  . Smokeless tobacco: Never Used  Substance and Sexual Activity  . Alcohol use: No  . Drug use: No  . Sexual activity: Not Currently

## 2019-06-08 DIAGNOSIS — M62472 Contracture of muscle, left ankle and foot: Secondary | ICD-10-CM | POA: Diagnosis not present

## 2019-06-08 DIAGNOSIS — R278 Other lack of coordination: Secondary | ICD-10-CM | POA: Diagnosis not present

## 2019-06-08 DIAGNOSIS — M79672 Pain in left foot: Secondary | ICD-10-CM | POA: Diagnosis not present

## 2019-06-08 DIAGNOSIS — R262 Difficulty in walking, not elsewhere classified: Secondary | ICD-10-CM | POA: Diagnosis not present

## 2019-06-08 DIAGNOSIS — M2559 Pain in other specified joint: Secondary | ICD-10-CM | POA: Diagnosis not present

## 2019-06-08 DIAGNOSIS — M6281 Muscle weakness (generalized): Secondary | ICD-10-CM | POA: Diagnosis not present

## 2019-06-08 DIAGNOSIS — M6250 Muscle wasting and atrophy, not elsewhere classified, unspecified site: Secondary | ICD-10-CM | POA: Diagnosis not present

## 2019-07-18 ENCOUNTER — Encounter: Payer: Self-pay | Admitting: Podiatry

## 2019-07-18 ENCOUNTER — Other Ambulatory Visit: Payer: Self-pay

## 2019-07-18 ENCOUNTER — Ambulatory Visit (INDEPENDENT_AMBULATORY_CARE_PROVIDER_SITE_OTHER): Payer: Medicare Other | Admitting: Podiatry

## 2019-07-18 DIAGNOSIS — M79674 Pain in right toe(s): Secondary | ICD-10-CM

## 2019-07-18 DIAGNOSIS — B351 Tinea unguium: Secondary | ICD-10-CM | POA: Diagnosis not present

## 2019-07-18 DIAGNOSIS — M79675 Pain in left toe(s): Secondary | ICD-10-CM

## 2019-07-18 DIAGNOSIS — I739 Peripheral vascular disease, unspecified: Secondary | ICD-10-CM | POA: Diagnosis not present

## 2019-07-18 DIAGNOSIS — L84 Corns and callosities: Secondary | ICD-10-CM | POA: Diagnosis not present

## 2019-07-18 MED ORDER — MUPIROCIN 2 % EX OINT
1.0000 | TOPICAL_OINTMENT | Freq: Two times a day (BID) | CUTANEOUS | 2 refills | Status: DC
Start: 2019-07-18 — End: 2023-04-03

## 2019-07-21 DIAGNOSIS — M48062 Spinal stenosis, lumbar region with neurogenic claudication: Secondary | ICD-10-CM | POA: Diagnosis not present

## 2019-07-25 DIAGNOSIS — M6250 Muscle wasting and atrophy, not elsewhere classified, unspecified site: Secondary | ICD-10-CM | POA: Diagnosis not present

## 2019-07-25 DIAGNOSIS — M62472 Contracture of muscle, left ankle and foot: Secondary | ICD-10-CM | POA: Diagnosis not present

## 2019-07-25 DIAGNOSIS — R262 Difficulty in walking, not elsewhere classified: Secondary | ICD-10-CM | POA: Diagnosis not present

## 2019-07-25 DIAGNOSIS — M6281 Muscle weakness (generalized): Secondary | ICD-10-CM | POA: Diagnosis not present

## 2019-07-25 DIAGNOSIS — M2559 Pain in other specified joint: Secondary | ICD-10-CM | POA: Diagnosis not present

## 2019-07-25 DIAGNOSIS — M79672 Pain in left foot: Secondary | ICD-10-CM | POA: Diagnosis not present

## 2019-07-25 DIAGNOSIS — R278 Other lack of coordination: Secondary | ICD-10-CM | POA: Diagnosis not present

## 2019-07-25 NOTE — Progress Notes (Signed)
Subjective: 86 year old female presents the office today for concerns of thick, discolored toenails that she cannot trim her self.  She also states that she has developed a corn on her fifth toes.  Denies any open sores. Denies any systemic complaints such as fevers, chills, nausea, vomiting. No acute changes since last appointment, and no other complaints at this time.   Objective: AAO x3, NAD DP/PT pulses palpable decreased Nails are hypertrophic, dystrophic, brittle, discolored, elongated 10. No surrounding redness or drainage. Tenderness nails 1-5 bilaterally. No open lesions or pre-ulcerative lesions are identified today. Hyperkeratotic lesions bilateral 5th digits without any underlying  No open lesions or pre-ulcerative lesions.  No pain with calf compression, swelling, warmth, erythema  Assessment: symptomatic onychomycosis, hyperkeratotic lesions  Plan: -All treatment options discussed with the patient including all alternatives, risks, complications.  -Nails debrided x10 without any complications or bleeding. -Hyperkeratotic lesion sharply debrided x2 without any complications or bleeding. -Patient encouraged to call the office with any questions, concerns, change in symptoms.   Trula Slade DPM

## 2019-08-01 DIAGNOSIS — M79672 Pain in left foot: Secondary | ICD-10-CM | POA: Diagnosis not present

## 2019-08-01 DIAGNOSIS — M62472 Contracture of muscle, left ankle and foot: Secondary | ICD-10-CM | POA: Diagnosis not present

## 2019-08-01 DIAGNOSIS — R278 Other lack of coordination: Secondary | ICD-10-CM | POA: Diagnosis not present

## 2019-08-01 DIAGNOSIS — R262 Difficulty in walking, not elsewhere classified: Secondary | ICD-10-CM | POA: Diagnosis not present

## 2019-08-01 DIAGNOSIS — M2559 Pain in other specified joint: Secondary | ICD-10-CM | POA: Diagnosis not present

## 2019-08-01 DIAGNOSIS — M6250 Muscle wasting and atrophy, not elsewhere classified, unspecified site: Secondary | ICD-10-CM | POA: Diagnosis not present

## 2019-08-01 DIAGNOSIS — M6281 Muscle weakness (generalized): Secondary | ICD-10-CM | POA: Diagnosis not present

## 2019-08-02 DIAGNOSIS — Y939 Activity, unspecified: Secondary | ICD-10-CM | POA: Insufficient documentation

## 2019-08-02 DIAGNOSIS — W1830XA Fall on same level, unspecified, initial encounter: Secondary | ICD-10-CM | POA: Insufficient documentation

## 2019-08-02 DIAGNOSIS — S51012A Laceration without foreign body of left elbow, initial encounter: Secondary | ICD-10-CM | POA: Diagnosis not present

## 2019-08-02 DIAGNOSIS — Z79899 Other long term (current) drug therapy: Secondary | ICD-10-CM | POA: Diagnosis not present

## 2019-08-02 DIAGNOSIS — Z853 Personal history of malignant neoplasm of breast: Secondary | ICD-10-CM | POA: Insufficient documentation

## 2019-08-02 DIAGNOSIS — E039 Hypothyroidism, unspecified: Secondary | ICD-10-CM | POA: Diagnosis not present

## 2019-08-02 DIAGNOSIS — Z23 Encounter for immunization: Secondary | ICD-10-CM | POA: Diagnosis not present

## 2019-08-02 DIAGNOSIS — Y92009 Unspecified place in unspecified non-institutional (private) residence as the place of occurrence of the external cause: Secondary | ICD-10-CM | POA: Diagnosis not present

## 2019-08-02 DIAGNOSIS — Y999 Unspecified external cause status: Secondary | ICD-10-CM | POA: Insufficient documentation

## 2019-08-02 DIAGNOSIS — F0390 Unspecified dementia without behavioral disturbance: Secondary | ICD-10-CM | POA: Diagnosis not present

## 2019-08-02 DIAGNOSIS — Z7982 Long term (current) use of aspirin: Secondary | ICD-10-CM | POA: Diagnosis not present

## 2019-08-03 ENCOUNTER — Emergency Department (HOSPITAL_COMMUNITY)
Admission: EM | Admit: 2019-08-03 | Discharge: 2019-08-03 | Disposition: A | Discharge status: Home / Self Care | Payer: Medicare Other | Attending: Emergency Medicine | Admitting: Emergency Medicine

## 2019-08-03 ENCOUNTER — Encounter (HOSPITAL_COMMUNITY): Payer: Self-pay | Admitting: Emergency Medicine

## 2019-08-03 ENCOUNTER — Other Ambulatory Visit: Payer: Self-pay

## 2019-08-03 DIAGNOSIS — S51012A Laceration without foreign body of left elbow, initial encounter: Secondary | ICD-10-CM

## 2019-08-03 DIAGNOSIS — Y92009 Unspecified place in unspecified non-institutional (private) residence as the place of occurrence of the external cause: Secondary | ICD-10-CM

## 2019-08-03 MED ORDER — BACITRACIN ZINC 500 UNIT/GM EX OINT
TOPICAL_OINTMENT | Freq: Once | CUTANEOUS | Status: AC
  Administered 2019-08-03: 2 via TOPICAL
  Filled 2019-08-03: qty 1.8

## 2019-08-03 MED ORDER — TETANUS-DIPHTH-ACELL PERTUSSIS 5-2.5-18.5 LF-MCG/0.5 IM SUSP
0.5000 mL | Freq: Once | INTRAMUSCULAR | Status: AC
  Administered 2019-08-03: 0.5 mL via INTRAMUSCULAR
  Filled 2019-08-03: qty 0.5

## 2019-08-03 NOTE — ED Triage Notes (Signed)
Patient states that she fell today. Patient scraped skin on left arm. Patient is not complaining of any pain. Patient denies hitting her head or loosing any consciousness.

## 2019-08-03 NOTE — ED Provider Notes (Signed)
[facility] DEPT Provider Note: Andrea Spurling, MD, FACEP  CSN: [id number] MRN: [id number] ARRIVAL: 08/02/19 at [place]: [place]   HISTORY OF PRESENT ILLNESS  08/03/19 2:00 AM Andrea Gilmore is a 86 y.o. female who fell at home about 11 PM yesterday evening.  Her only complaint is a skin tear to her left elbow.  She denies any pain.  She did not hit her head.  She is not sure of her tetanus status.  She is accompanied by her daughter who was at home when she fell.   Past Medical History:  Diagnosis Date  . Arthritis   . Breast cancer ([place])    breast cancer  . Cardiomyopathy ([place])    a. possibly ischemic >> NSTEMI after should surgery in 07/2014 >>Echo 07/29/14:  EF 65%;  EF 08/01/14:  EF 20-25%, ant-septal AK, Gr 2 DD, mod MR, mod LAE, mod TR, PASP 63 mmHg  . Chronic systolic CHF (congestive heart failure) ([place])   . CKD (chronic kidney disease)   . Dementia ([place])   . History of echocardiogram    Echo 9/16:  Vigorous LVF, EF 65-70%, Gr 1 DD, mild MR, mild LAE, PASP 32 mmHg  . Hypothyroidism     Past Surgical History:  Procedure Laterality Date  . ABDOMINAL HYSTERECTOMY    . BACK SURGERY    . BILATERAL CARPAL TUNNEL RELEASE    . CHOLECYSTECTOMY    . EYE SURGERY Right   . KNEE SURGERY    . REVERSE SHOULDER ARTHROPLASTY Left 07/27/2014   Procedure: REVERSE SHOULDER ARTHROPLASTY;  Surgeon: Tania Ade, MD;  Location: [facility];  Service: Orthopedics;  Laterality: Left;  Left reverse total shoulder arthroplasty  . SHOULDER SURGERY Bilateral   . TONSILLECTOMY      Family History  Problem Relation Age of Onset  . Brain cancer Mother   . Cancer - Lung Father     Social History   Tobacco Use  . Smoking status: Never Smoker  . Smokeless tobacco: Never Used  Vaping Use  . Vaping Use: Never used  Substance Use Topics  . Alcohol use: No  . Drug use: No    Prior to Admission medications   Medication Sig Start Date End Date Taking?  Authorizing Provider  acetaminophen (TYLENOL) 500 MG tablet Take 500 mg by mouth every 6 (six) hours as needed for mild pain.    [provider]  aspirin EC 81 MG tablet Take 81 mg by mouth daily.    [provider]  diclofenac Sodium (VOLTAREN) 1 % GEL Apply 2 g topically 4 (four) times daily. Rub into affected area of foot 2 to 4 times daily 02/28/19   Trula Slade, DPM  latanoprost Ivin Poot) 0.005 % ophthalmic solution  02/27/19   [provider]  Levothyroxine Sodium (EUTHYROX PO) Take 25 mg by mouth.     [provider]  mupirocin ointment (BACTROBAN) 2 % Apply 1 application topically 2 (two) times daily. 02/28/19   Trula Slade, DPM  mupirocin ointment (BACTROBAN) 2 % Apply 1 application topically 2 (two) times daily. 07/18/19   Trula Slade, DPM  OLANZAPINE PO Take by mouth.    [provider]  traMADol HCl 100 MG TABS  02/10/19   [provider]    Allergies Metoprolol tartrate, Brimonidine, Brinzolamide, Codeine, Dorzolamide hcl-timolol mal, Blue dyes (parenteral), Morphine and related, and Red dye   REVIEW OF SYSTEMS  Negative except as  noted here or in the History of Present Illness.   PHYSICAL EXAMINATION  Initial Vital Signs Blood pressure (!) 174/78, pulse 79, temperature 98.3 F (36.8 C), temperature source Oral, resp. rate 16, height 5\' 2"  (1.575 m), weight (!) 38.6 kg, SpO2 99 %.  Examination General: Well-developed, cachectic female in no acute distress; appearance consistent with age of record HENT: normocephalic; atraumatic Eyes: pupils equal, round and reactive to light; arcus senilis bilaterally Neck: supple; nontender; lordotic Heart: regular rate and rhythm Lungs: clear to auscultation bilaterally Abdomen: soft; nondistended; nontender; bowel sounds present Extremities: Chronic appearing arthritic deformities; pulses normal; no pain on passive range of movement Neurologic: Awake, alert and  oriented x 2; motor function intact in all extremities and symmetric; no facial droop Skin: Warm and dry; skin tears left elbow:    Psychiatric: Normal mood and affect   RESULTS  Summary of this visit's results, reviewed and interpreted by myself:   EKG Interpretation  Date/Time:    Ventricular Rate:    PR Interval:    QRS Duration:   QT Interval:    QTC Calculation:   R Axis:     Text Interpretation:        Laboratory Studies: No results found for this or any previous visit (from the past 24 hour(s)). Imaging Studies: No results found.  ED COURSE and MDM  Nursing notes, initial and subsequent vitals signs, including pulse oximetry, reviewed and interpreted by myself.  Vitals:   08/02/19 2332  BP: (!) 174/78  Pulse: 79  Resp: 16  Temp: 98.3 F (36.8 C)  TempSrc: Oral  SpO2: 99%  Weight: (!) 38.6 kg  Height: 5\' 2"  (1.575 m)   Medications  Tdap (BOOSTRIX) injection 0.5 mL (has no administration in time range)    Nursing staff bandaged the patient's wounds.  The skin of her elbow is very thin and fragile and is not amenable to suturing.  Tetanus updated.  PROCEDURES  Procedures   ED DIAGNOSES     ICD-10-CM   1. Fall in home, initial encounter  W19.XXXA    Y92.009   2. Skin tear of left elbow without complication, initial encounter  S51.012A        Valory Wetherby, Jenny Reichmann, MD 08/03/19 [phone]

## 2019-08-04 ENCOUNTER — Encounter: Payer: Self-pay | Admitting: Orthopaedic Surgery

## 2019-08-04 ENCOUNTER — Ambulatory Visit (INDEPENDENT_AMBULATORY_CARE_PROVIDER_SITE_OTHER): Payer: Medicare Other | Admitting: Orthopaedic Surgery

## 2019-08-04 DIAGNOSIS — M19011 Primary osteoarthritis, right shoulder: Secondary | ICD-10-CM

## 2019-08-04 MED ORDER — BUPIVACAINE HCL 0.5 % IJ SOLN
2.0000 mL | INTRAMUSCULAR | Status: AC | PRN
  Administered 2019-08-04: 2 mL via INTRA_ARTICULAR

## 2019-08-04 MED ORDER — METHYLPREDNISOLONE ACETATE 40 MG/ML IJ SUSP
80.0000 mg | INTRAMUSCULAR | Status: AC | PRN
  Administered 2019-08-04: 80 mg via INTRA_ARTICULAR

## 2019-08-04 MED ORDER — LIDOCAINE HCL 1 % IJ SOLN
2.0000 mL | INTRAMUSCULAR | Status: AC | PRN
  Administered 2019-08-04: 2 mL

## 2019-08-04 NOTE — Progress Notes (Signed)
Office Visit Note   Patient: Andrea Gilmore           Date of Birth: 1925-10-07           MRN: [id number] Visit Date: 08/04/2019              Requested by: Mayra Neer, MD [address] [place],  [place] [address] PCP: Mayra Neer, MD   Assessment & Plan: Visit Diagnoses:  1. Primary osteoarthritis, right shoulder     Plan: [name] of her pain.  Has end-stage osteoarthritis of her right shoulder probably based on rotator cuff arthropathy.  On occasion I seen her for aspiration with temporary relief.  She has a number of comorbidities that preclude surgery.  Will reinject her shoulder today  Follow-Up Instructions: Return if symptoms worsen or fail to improve.   Orders:  No orders of the defined types were placed in this encounter.  No orders of the defined types were placed in this encounter.     Procedures: Large Joint Inj: R glenohumeral on 08/04/2019 4:05 PM Indications: pain and diagnostic evaluation Details: 25 G 1.5 in needle, posterior approach  Arthrogram: No  Medications: 2 mL lidocaine 1 %; 2 mL bupivacaine 0.5 %; 80 mg methylPREDNISolone acetate 40 MG/ML Aspirate: 3 mL bloody Outcome: tolerated well, no immediate complications  Bloody aspirate is no different than it has been in the past Consent was given by the patient. Immediately prior to procedure a time out was called to verify the correct patient, procedure, equipment, support staff and site/side marked as required. Patient was prepped and draped in the usual sterile fashion.       Clinical Data: No additional findings.   Subjective: Chief Complaint  Patient presents with  . Right Shoulder - Pain  Recurrent pain in her right shoulder without recent injury or trauma.  Has end-stage rotator cuff arthropathy with multiple medical comorbidities that preclude surgery. has responded well to cortisone in the past.  Will inject today  HPI  Review of  Systems   Objective: Vital Signs: There were no vitals taken for this visit.  Physical Exam Constitutional:      Appearance: She is well-developed.  Eyes:     Pupils: Pupils are equal, round, and reactive to light.  Pulmonary:     Effort: Pulmonary effort is normal.  Skin:    General: Skin is warm and dry.  Neurological:     Mental Status: She is alert and oriented to person, place, and time.  Psychiatric:        Behavior: Behavior normal.     Ortho Exam limited range of motion right shoulder without change from prior exams based on her arthritis.  Multiple areas of ecchymosis on both upper and lower extremities which are also unchanged from the past.  Positive crepitation and pain with any motion of her right shoulder.  Specialty Comments:  No specialty comments available.  Imaging: No results found.   PMFS History: Patient Active Problem List   Diagnosis Date Noted  . Critical limb ischemia with history of revascularization of same extremity 04/15/2019  . Primary osteoarthritis, right shoulder 08/03/2018  . Unilateral primary osteoarthritis, right knee 01/12/2017  . Takotsubo syndrome 12/08/2014  . S/p reverse total shoulder arthroplasty   . Cardiomyopathy ([place])   . Iron deficiency anemia   . Aspiration pneumonia due to inhalation of milk ([place])   . Secondary cardiomyopathy ([place])   . Acute cystitis without hematuria   .  Hypoxia   . Blood poisoning   . Aspiration pneumonia due to food (regurgitated) ([place])   . Chronic kidney disease, stage III (moderate)   . UTI (lower urinary tract infection)   . Anemia, iron deficiency   . Acute blood loss anemia   . Acute encephalopathy   . Protein-calorie malnutrition, severe ([place]) 08/01/2014  . Acute systolic heart failure ([place])   . Respiratory failure ([place])   . NSTEMI (non-ST elevated myocardial infarction) ([place])   . Protein-calorie malnutrition ([place])   . Other specified hypothyroidism   . Acute pulmonary edema (HCC)    . Pleural effusion, left   . CKD (chronic kidney disease), stage III 07/28/2014  . Metabolic encephalopathy 97/98/9211  . Acute delirium 07/28/2014  . Fever 07/28/2014  . Acute respiratory failure with hypoxia ([place]) 07/28/2014  . Metabolic acidosis 94/17/4081  . Sepsis ([place]) 07/28/2014  . Left rotator cuff tear arthropathy 07/27/2014  . History of iron deficiency anemia 04/14/2013  . Acute renal failure ([place]) 04/14/2013  . Primary open angle glaucoma 04/08/2012  . History of breast cancer 06/17/2011   Past Medical History:  Diagnosis Date  . Arthritis   . Breast cancer ([place])    breast cancer  . Cardiomyopathy ([place])    a. possibly ischemic >> NSTEMI after should surgery in 07/2014 >>Echo 07/29/14:  EF 65%;  EF 08/01/14:  EF 20-25%, ant-septal AK, Gr 2 DD, mod MR, mod LAE, mod TR, PASP 63 mmHg  . Chronic systolic CHF (congestive heart failure) ([place])   . CKD (chronic kidney disease)   . Dementia ([place])   . History of echocardiogram    Echo 9/16:  Vigorous LVF, EF 65-70%, Gr 1 DD, mild MR, mild LAE, PASP 32 mmHg  . Hypothyroidism     Family History  Problem Relation Age of Onset  . Brain cancer Mother   . Cancer - Lung Father     Past Surgical History:  Procedure Laterality Date  . ABDOMINAL HYSTERECTOMY    . BACK SURGERY    . BILATERAL CARPAL TUNNEL RELEASE    . CHOLECYSTECTOMY    . EYE SURGERY Right   . KNEE SURGERY    . REVERSE SHOULDER ARTHROPLASTY Left 07/27/2014   Procedure: REVERSE SHOULDER ARTHROPLASTY;  Surgeon: Tania Ade, MD;  Location: [facility];  Service: Orthopedics;  Laterality: Left;  Left reverse total shoulder arthroplasty  . SHOULDER SURGERY Bilateral   . TONSILLECTOMY     Social History   Occupational History  . Not on file  Tobacco Use  . Smoking status: Never Smoker  . Smokeless tobacco: Never Used  Vaping Use  . Vaping Use: Never used  Substance and Sexual Activity  . Alcohol use: No  . Drug use: No  . Sexual activity: Not Currently      Garald Balding, MD   Note - This record has been created using [employer].  Chart creation errors have been sought, but may not always  have been located. Such creation errors do not reflect on  the standard of medical care.

## 2019-08-08 ENCOUNTER — Other Ambulatory Visit: Payer: Self-pay

## 2019-08-08 ENCOUNTER — Ambulatory Visit (INDEPENDENT_AMBULATORY_CARE_PROVIDER_SITE_OTHER): Payer: Medicare Other | Admitting: Podiatry

## 2019-08-08 ENCOUNTER — Encounter: Payer: Self-pay | Admitting: Podiatry

## 2019-08-08 DIAGNOSIS — M79675 Pain in left toe(s): Secondary | ICD-10-CM

## 2019-08-08 DIAGNOSIS — I739 Peripheral vascular disease, unspecified: Secondary | ICD-10-CM

## 2019-08-08 DIAGNOSIS — M79674 Pain in right toe(s): Secondary | ICD-10-CM | POA: Diagnosis not present

## 2019-08-08 DIAGNOSIS — L84 Corns and callosities: Secondary | ICD-10-CM | POA: Diagnosis not present

## 2019-08-08 DIAGNOSIS — B351 Tinea unguium: Secondary | ICD-10-CM

## 2019-08-09 DIAGNOSIS — S51819A Laceration without foreign body of unspecified forearm, initial encounter: Secondary | ICD-10-CM | POA: Diagnosis not present

## 2019-08-09 DIAGNOSIS — D692 Other nonthrombocytopenic purpura: Secondary | ICD-10-CM | POA: Diagnosis not present

## 2019-08-09 DIAGNOSIS — T148XXA Other injury of unspecified body region, initial encounter: Secondary | ICD-10-CM | POA: Diagnosis not present

## 2019-08-10 DIAGNOSIS — M6250 Muscle wasting and atrophy, not elsewhere classified, unspecified site: Secondary | ICD-10-CM | POA: Diagnosis not present

## 2019-08-10 DIAGNOSIS — M62472 Contracture of muscle, left ankle and foot: Secondary | ICD-10-CM | POA: Diagnosis not present

## 2019-08-10 DIAGNOSIS — R262 Difficulty in walking, not elsewhere classified: Secondary | ICD-10-CM | POA: Diagnosis not present

## 2019-08-10 DIAGNOSIS — M6281 Muscle weakness (generalized): Secondary | ICD-10-CM | POA: Diagnosis not present

## 2019-08-10 DIAGNOSIS — M79672 Pain in left foot: Secondary | ICD-10-CM | POA: Diagnosis not present

## 2019-08-10 DIAGNOSIS — R278 Other lack of coordination: Secondary | ICD-10-CM | POA: Diagnosis not present

## 2019-08-10 DIAGNOSIS — M2559 Pain in other specified joint: Secondary | ICD-10-CM | POA: Diagnosis not present

## 2019-08-14 NOTE — Progress Notes (Signed)
Subjective: 86 year old female presents the office today for concerns of thick, discolored toenails that she cannot trim herself.  This corns and fifth toes that cause discomfort.  Denies any drainage or pus or any swelling.  She recently did follow into the emergency department and significantly bruised but no lower extremity pain or injury.  Denies any open sores. Denies any systemic complaints such as fevers, chills, nausea, vomiting. No acute changes since last appointment, and no other complaints at this time.   Objective: AAO x3, NAD DP/PT pulses palpable decreased Nails are hypertrophic, dystrophic, brittle, discolored, elongated 10. No surrounding redness or drainage. Tenderness nails 1-5 bilaterally. No open lesions or pre-ulcerative lesions are identified today. Hyperkeratotic lesions bilateral 5th digits without any underlying  No open lesions or pre-ulcerative lesions.  No pain with calf compression, swelling, warmth, erythema  Assessment: symptomatic onychomycosis, hyperkeratotic lesions  Plan: -All treatment options discussed with the patient including all alternatives, risks, complications.  -Nails debrided x10 without any complications or bleeding. -Hyperkeratotic lesion sharply debrided x2 without any complications or bleeding. -Patient encouraged to call the office with any questions, concerns, change in symptoms.   Trula Slade DPM

## 2019-08-15 DIAGNOSIS — M62472 Contracture of muscle, left ankle and foot: Secondary | ICD-10-CM | POA: Diagnosis not present

## 2019-08-15 DIAGNOSIS — M6250 Muscle wasting and atrophy, not elsewhere classified, unspecified site: Secondary | ICD-10-CM | POA: Diagnosis not present

## 2019-08-15 DIAGNOSIS — R262 Difficulty in walking, not elsewhere classified: Secondary | ICD-10-CM | POA: Diagnosis not present

## 2019-08-15 DIAGNOSIS — R278 Other lack of coordination: Secondary | ICD-10-CM | POA: Diagnosis not present

## 2019-08-15 DIAGNOSIS — M79672 Pain in left foot: Secondary | ICD-10-CM | POA: Diagnosis not present

## 2019-08-15 DIAGNOSIS — M6281 Muscle weakness (generalized): Secondary | ICD-10-CM | POA: Diagnosis not present

## 2019-08-15 DIAGNOSIS — M2559 Pain in other specified joint: Secondary | ICD-10-CM | POA: Diagnosis not present

## 2019-08-17 DIAGNOSIS — H401133 Primary open-angle glaucoma, bilateral, severe stage: Secondary | ICD-10-CM | POA: Diagnosis not present

## 2019-08-17 DIAGNOSIS — H5203 Hypermetropia, bilateral: Secondary | ICD-10-CM | POA: Diagnosis not present

## 2019-08-22 DIAGNOSIS — M62472 Contracture of muscle, left ankle and foot: Secondary | ICD-10-CM | POA: Diagnosis not present

## 2019-08-22 DIAGNOSIS — R262 Difficulty in walking, not elsewhere classified: Secondary | ICD-10-CM | POA: Diagnosis not present

## 2019-08-22 DIAGNOSIS — M6281 Muscle weakness (generalized): Secondary | ICD-10-CM | POA: Diagnosis not present

## 2019-08-22 DIAGNOSIS — M6250 Muscle wasting and atrophy, not elsewhere classified, unspecified site: Secondary | ICD-10-CM | POA: Diagnosis not present

## 2019-08-22 DIAGNOSIS — R278 Other lack of coordination: Secondary | ICD-10-CM | POA: Diagnosis not present

## 2019-08-22 DIAGNOSIS — M2559 Pain in other specified joint: Secondary | ICD-10-CM | POA: Diagnosis not present

## 2019-08-22 DIAGNOSIS — M79672 Pain in left foot: Secondary | ICD-10-CM | POA: Diagnosis not present

## 2019-08-24 DIAGNOSIS — I10 Essential (primary) hypertension: Secondary | ICD-10-CM | POA: Diagnosis not present

## 2019-08-24 DIAGNOSIS — F322 Major depressive disorder, single episode, severe without psychotic features: Secondary | ICD-10-CM | POA: Diagnosis not present

## 2019-08-24 DIAGNOSIS — M810 Age-related osteoporosis without current pathological fracture: Secondary | ICD-10-CM | POA: Diagnosis not present

## 2019-08-24 DIAGNOSIS — M549 Dorsalgia, unspecified: Secondary | ICD-10-CM | POA: Diagnosis not present

## 2019-08-24 DIAGNOSIS — E46 Unspecified protein-calorie malnutrition: Secondary | ICD-10-CM | POA: Diagnosis not present

## 2019-08-24 DIAGNOSIS — D692 Other nonthrombocytopenic purpura: Secondary | ICD-10-CM | POA: Diagnosis not present

## 2019-08-24 DIAGNOSIS — E039 Hypothyroidism, unspecified: Secondary | ICD-10-CM | POA: Diagnosis not present

## 2019-08-29 DIAGNOSIS — R278 Other lack of coordination: Secondary | ICD-10-CM | POA: Diagnosis not present

## 2019-08-29 DIAGNOSIS — M6250 Muscle wasting and atrophy, not elsewhere classified, unspecified site: Secondary | ICD-10-CM | POA: Diagnosis not present

## 2019-08-29 DIAGNOSIS — M6281 Muscle weakness (generalized): Secondary | ICD-10-CM | POA: Diagnosis not present

## 2019-08-29 DIAGNOSIS — M62472 Contracture of muscle, left ankle and foot: Secondary | ICD-10-CM | POA: Diagnosis not present

## 2019-08-29 DIAGNOSIS — M79672 Pain in left foot: Secondary | ICD-10-CM | POA: Diagnosis not present

## 2019-08-29 DIAGNOSIS — R262 Difficulty in walking, not elsewhere classified: Secondary | ICD-10-CM | POA: Diagnosis not present

## 2019-08-29 DIAGNOSIS — M2559 Pain in other specified joint: Secondary | ICD-10-CM | POA: Diagnosis not present

## 2019-08-30 DIAGNOSIS — M47816 Spondylosis without myelopathy or radiculopathy, lumbar region: Secondary | ICD-10-CM | POA: Diagnosis not present

## 2019-08-30 DIAGNOSIS — M48062 Spinal stenosis, lumbar region with neurogenic claudication: Secondary | ICD-10-CM | POA: Diagnosis not present

## 2019-08-30 DIAGNOSIS — Z981 Arthrodesis status: Secondary | ICD-10-CM | POA: Diagnosis not present

## 2019-09-05 DIAGNOSIS — M6250 Muscle wasting and atrophy, not elsewhere classified, unspecified site: Secondary | ICD-10-CM | POA: Diagnosis not present

## 2019-09-05 DIAGNOSIS — R278 Other lack of coordination: Secondary | ICD-10-CM | POA: Diagnosis not present

## 2019-09-05 DIAGNOSIS — M62472 Contracture of muscle, left ankle and foot: Secondary | ICD-10-CM | POA: Diagnosis not present

## 2019-09-05 DIAGNOSIS — M2559 Pain in other specified joint: Secondary | ICD-10-CM | POA: Diagnosis not present

## 2019-09-05 DIAGNOSIS — M6281 Muscle weakness (generalized): Secondary | ICD-10-CM | POA: Diagnosis not present

## 2019-09-05 DIAGNOSIS — M79672 Pain in left foot: Secondary | ICD-10-CM | POA: Diagnosis not present

## 2019-09-05 DIAGNOSIS — R262 Difficulty in walking, not elsewhere classified: Secondary | ICD-10-CM | POA: Diagnosis not present

## 2019-10-06 ENCOUNTER — Ambulatory Visit (INDEPENDENT_AMBULATORY_CARE_PROVIDER_SITE_OTHER): Payer: Medicare Other | Admitting: Podiatry

## 2019-10-06 ENCOUNTER — Other Ambulatory Visit: Payer: Self-pay

## 2019-10-06 DIAGNOSIS — M79675 Pain in left toe(s): Secondary | ICD-10-CM

## 2019-10-06 DIAGNOSIS — L84 Corns and callosities: Secondary | ICD-10-CM | POA: Diagnosis not present

## 2019-10-06 DIAGNOSIS — B351 Tinea unguium: Secondary | ICD-10-CM | POA: Diagnosis not present

## 2019-10-06 DIAGNOSIS — M79674 Pain in right toe(s): Secondary | ICD-10-CM | POA: Diagnosis not present

## 2019-10-06 DIAGNOSIS — I739 Peripheral vascular disease, unspecified: Secondary | ICD-10-CM | POA: Diagnosis not present

## 2019-10-06 NOTE — Progress Notes (Signed)
Subjective: 86 year old female presents the office today for concerns of thick, discolored toenails that she cannot trim herself and for a callus on the right fifth toe which occasionally causes discomfort. She denies any swelling or redness or drainage or pus. Denies any open sores. Denies any systemic complaints such as fevers, chills, nausea, vomiting. No acute changes since last appointment, and no other complaints at this time.   Objective: AAO x3, NAD DP/PT pulses palpable decreased Nails are hypertrophic, dystrophic, brittle, discolored, elongated 10. No surrounding redness or drainage. Tenderness nails 1-5 bilaterally. No open lesions or pre-ulcerative lesions are identified today. Hyperkeratotic lesions right fifth digit without any underlying ulceration drainage or signs of infection.  No open lesions or pre-ulcerative lesions.  No pain with calf compression, swelling, warmth, erythema  Assessment: symptomatic onychomycosis, hyperkeratotic lesions  Plan: -All treatment options discussed with the patient including all alternatives, risks, complications.  -Nails debrided x10 without any complications or bleeding. -Hyperkeratotic lesion sharply debrided x1 without any complications or bleeding. -Patient encouraged to call the office with any questions, concerns, change in symptoms.   Trula Slade DPM

## 2019-10-12 DIAGNOSIS — Z23 Encounter for immunization: Secondary | ICD-10-CM | POA: Diagnosis not present

## 2019-10-20 DIAGNOSIS — M48062 Spinal stenosis, lumbar region with neurogenic claudication: Secondary | ICD-10-CM | POA: Diagnosis not present

## 2019-11-02 ENCOUNTER — Ambulatory Visit (INDEPENDENT_AMBULATORY_CARE_PROVIDER_SITE_OTHER): Payer: Medicare Other | Admitting: Orthopaedic Surgery

## 2019-11-02 ENCOUNTER — Other Ambulatory Visit: Payer: Self-pay

## 2019-11-02 ENCOUNTER — Encounter: Payer: Self-pay | Admitting: Orthopaedic Surgery

## 2019-11-02 DIAGNOSIS — M12811 Other specific arthropathies, not elsewhere classified, right shoulder: Secondary | ICD-10-CM

## 2019-11-02 MED ORDER — BUPIVACAINE HCL 0.5 % IJ SOLN
2.0000 mL | INTRAMUSCULAR | Status: AC | PRN
  Administered 2019-11-02: 2 mL via INTRA_ARTICULAR

## 2019-11-02 MED ORDER — LIDOCAINE HCL 1 % IJ SOLN
2.0000 mL | INTRAMUSCULAR | Status: AC | PRN
  Administered 2019-11-02: 2 mL

## 2019-11-02 NOTE — Progress Notes (Signed)
Office Visit Note   Patient: Andrea Gilmore           Date of Birth: 1925/09/29           MRN: [id number] Visit Date: 11/02/2019              Requested by: Mayra Neer, MD [address] [place],  [place] [address] PCP: Mayra Neer, MD   Assessment & Plan: Visit Diagnoses:  1. Rotator cuff arthropathy of both shoulders     Plan: Andrea Gilmore has an established diagnosis of severe rotator cuff arthropathy right shoulder.  I have injected the joint with cortisone at intervals with some relief of her pain.  She is having recurrent symptoms on going to reinject her shoulder.  She also has arthritis in both of her knees and would like to have a cortisone injection into the next several weeks.  She will return in 2 to 3 weeks  Follow-Up Instructions: Return in about 2 weeks (around 11/16/2019), or if symptoms worsen or fail to improve.   Orders:  Orders Placed This Encounter  Procedures  . Large Joint Inj: R glenohumeral   No orders of the defined types were placed in this encounter.     Procedures: Large Joint Inj: R glenohumeral on 11/02/2019 3:50 PM Indications: pain and diagnostic evaluation Details: 25 G 1.5 in needle, posterior approach  Arthrogram: No  Medications: 2 mL lidocaine 1 %; 2 mL bupivacaine 0.5 %  2 mL betamethasone Consent was given by the patient. Immediately prior to procedure a time out was called to verify the correct patient, procedure, equipment, support staff and site/side marked as required. Patient was prepped and draped in the usual sterile fashion.       Clinical Data: No additional findings.   Subjective: Chief Complaint  Patient presents with  . Right Shoulder - Follow-up  Patient presents today for her right shoulder. She was last seen three months ago and received a cortisone injection in her shoulder. She said her injection has worn off and wants to get another one today.   HPI  Review of  Systems   Objective: Vital Signs: Ht 5\' 2"  (1.575 m)   Wt 85 lb (38.6 kg)   BMI 15.55 kg/m   Physical Exam Constitutional:      Appearance: She is well-developed.  Eyes:     Pupils: Pupils are equal, round, and reactive to light.  Pulmonary:     Effort: Pulmonary effort is normal.  Skin:    General: Skin is warm and dry.  Neurological:     Mental Status: She is alert and oriented to person, place, and time.  Psychiatric:        Behavior: Behavior normal.     Ortho Exam awake and alert and oriented x3.  Uses a walker for ambulation.  Right shoulder has only about 60 degrees of abduction passively and about 80 degrees of flexion passively with significant crepitation.  Quite weak with all motions.  Small effusion Specialty Comments:  No specialty comments available.  Imaging: No results found.   PMFS History: Patient Active Problem List   Diagnosis Date Noted  . Rotator cuff arthropathy of both shoulders 11/02/2019  . Critical limb ischemia with history of revascularization of same extremity ([place]) 04/15/2019  . Primary osteoarthritis, right shoulder 08/03/2018  . Unilateral primary osteoarthritis, right knee 01/12/2017  . Takotsubo syndrome 12/08/2014  . S/p reverse total shoulder arthroplasty   . Cardiomyopathy ([place])   .  Iron deficiency anemia   . Aspiration pneumonia due to inhalation of milk ([place])   . Secondary cardiomyopathy ([place])   . Acute cystitis without hematuria   . Hypoxia   . Blood poisoning   . Aspiration pneumonia due to food (regurgitated) ([place])   . Chronic kidney disease, stage III (moderate) (HCC)   . UTI (lower urinary tract infection)   . Anemia, iron deficiency   . Acute blood loss anemia   . Acute encephalopathy   . Protein-calorie malnutrition, severe ([place]) 08/01/2014  . Acute systolic heart failure ([place])   . Respiratory failure ([place])   . NSTEMI (non-ST elevated myocardial infarction) ([place])   . Protein-calorie malnutrition ([place])   . Other  specified hypothyroidism   . Acute pulmonary edema (HCC)   . Pleural effusion, left   . CKD (chronic kidney disease), stage III ([place]) 07/28/2014  . Metabolic encephalopathy 92/44/6286  . Acute delirium 07/28/2014  . Fever 07/28/2014  . Acute respiratory failure with hypoxia ([place]) 07/28/2014  . Metabolic acidosis 38/17/7116  . Sepsis ([place]) 07/28/2014  . Left rotator cuff tear arthropathy 07/27/2014  . History of iron deficiency anemia 04/14/2013  . Acute renal failure ([place]) 04/14/2013  . Primary open angle glaucoma 04/08/2012  . History of breast cancer 06/17/2011   Past Medical History:  Diagnosis Date  . Arthritis   . Breast cancer ([place])    breast cancer  . Cardiomyopathy ([place])    a. possibly ischemic >> NSTEMI after should surgery in 07/2014 >>Echo 07/29/14:  EF 65%;  EF 08/01/14:  EF 20-25%, ant-septal AK, Gr 2 DD, mod MR, mod LAE, mod TR, PASP 63 mmHg  . Chronic systolic CHF (congestive heart failure) ([place])   . CKD (chronic kidney disease)   . Dementia ([place])   . History of echocardiogram    Echo 9/16:  Vigorous LVF, EF 65-70%, Gr 1 DD, mild MR, mild LAE, PASP 32 mmHg  . Hypothyroidism     Family History  Problem Relation Age of Onset  . Brain cancer Mother   . Cancer - Lung Father     Past Surgical History:  Procedure Laterality Date  . ABDOMINAL HYSTERECTOMY    . BACK SURGERY    . BILATERAL CARPAL TUNNEL RELEASE    . CHOLECYSTECTOMY    . EYE SURGERY Right   . KNEE SURGERY    . REVERSE SHOULDER ARTHROPLASTY Left 07/27/2014   Procedure: REVERSE SHOULDER ARTHROPLASTY;  Surgeon: Tania Ade, MD;  Location: [facility];  Service: Orthopedics;  Laterality: Left;  Left reverse total shoulder arthroplasty  . SHOULDER SURGERY Bilateral   . TONSILLECTOMY     Social History   Occupational History  . Not on file  Tobacco Use  . Smoking status: Never Smoker  . Smokeless tobacco: Never Used  Vaping Use  . Vaping Use: Never used  Substance and Sexual Activity  . Alcohol use:  No  . Drug use: No  . Sexual activity: Not Currently

## 2019-11-09 DIAGNOSIS — Z23 Encounter for immunization: Secondary | ICD-10-CM | POA: Diagnosis not present

## 2019-11-17 DIAGNOSIS — M4316 Spondylolisthesis, lumbar region: Secondary | ICD-10-CM | POA: Diagnosis not present

## 2019-11-17 DIAGNOSIS — M48062 Spinal stenosis, lumbar region with neurogenic claudication: Secondary | ICD-10-CM | POA: Diagnosis not present

## 2019-11-24 ENCOUNTER — Ambulatory Visit (INDEPENDENT_AMBULATORY_CARE_PROVIDER_SITE_OTHER): Payer: Medicare Other | Admitting: Orthopaedic Surgery

## 2019-11-24 ENCOUNTER — Encounter: Payer: Self-pay | Admitting: Orthopaedic Surgery

## 2019-11-24 ENCOUNTER — Other Ambulatory Visit: Payer: Self-pay

## 2019-11-24 DIAGNOSIS — M1711 Unilateral primary osteoarthritis, right knee: Secondary | ICD-10-CM | POA: Diagnosis not present

## 2019-11-24 MED ORDER — METHYLPREDNISOLONE ACETATE 40 MG/ML IJ SUSP
80.0000 mg | INTRAMUSCULAR | Status: AC | PRN
Start: 2019-11-24 — End: 2019-11-24
  Administered 2019-11-24: 80 mg via INTRA_ARTICULAR

## 2019-11-24 MED ORDER — LIDOCAINE HCL 1 % IJ SOLN
2.0000 mL | INTRAMUSCULAR | Status: AC | PRN
  Administered 2019-11-24: 2 mL

## 2019-11-24 MED ORDER — BUPIVACAINE HCL 0.25 % IJ SOLN
2.0000 mL | INTRAMUSCULAR | Status: AC | PRN
  Administered 2019-11-24: 2 mL via INTRA_ARTICULAR

## 2019-11-24 NOTE — Progress Notes (Signed)
Office Visit Note   Patient: Andrea Gilmore           Date of Birth: [birth year]           MRN: [id number] Visit Date: 11/24/2019              Requested by: Mayra Neer, MD [address] [place],  [place] [address] PCP: Mayra Neer, MD   Assessment & Plan: Visit Diagnoses:  1. Unilateral primary osteoarthritis, right knee     Plan:  #1: Corticosteroid injection to the right knee was performed.  Tolerated the procedure well. #2: Follow back up as needed.  Follow-Up Instructions: No follow-ups on file.   Orders:  No orders of the defined types were placed in this encounter.  No orders of the defined types were placed in this encounter.     Procedures: Large Joint Inj: R knee on 11/24/2019 4:35 PM Indications: pain and diagnostic evaluation Details: 25 G 1.5 in needle, anteromedial approach  Arthrogram: No  Medications: 2 mL lidocaine 1 %; 80 mg methylPREDNISolone acetate 40 MG/ML; 2 mL bupivacaine 0.25 % Outcome: tolerated well, no immediate complications Procedure, treatment alternatives, risks and benefits explained, specific risks discussed. Consent was given by the patient. Immediately prior to procedure a time out was called to verify the correct patient, procedure, equipment, support staff and site/side marked as required. Patient was prepped and draped in the usual sterile fashion.       Clinical Data: No additional findings.   Subjective: Chief Complaint  Patient presents with  . Right Knee - Follow-up, Pain   HPI Patient presents today for her right knee. She is wanting to have her right knee injected today. She states that at her last visit she had right shoulder injected. It has been just over two weeks since her shoulder was injected, and she states that it did not help her pain.    Review of Systems  Constitutional: Negative for fatigue.  Eyes: Negative for pain.  Respiratory: Negative for shortness of breath.     Cardiovascular: Negative for leg swelling.  Gastrointestinal: Positive for constipation. Negative for diarrhea.  Endocrine: Negative for cold intolerance and heat intolerance.  Genitourinary: Positive for difficulty urinating.  Musculoskeletal: Negative for joint swelling.  Skin: Negative for rash.  Allergic/Immunologic: Negative for food allergies.  Neurological: Positive for weakness.  Hematological: Bruises/bleeds easily.  Psychiatric/Behavioral: Negative for sleep disturbance.     Objective: Vital Signs: Ht 5\' 2"  (1.575 m)   Wt 85 lb (38.6 kg)   BMI 15.55 kg/m   Physical Exam Constitutional:      Appearance: Normal appearance. She is well-developed and normal weight.  HENT:     Head: Normocephalic.  Eyes:     Pupils: Pupils are equal, round, and reactive to light.  Pulmonary:     Effort: Pulmonary effort is normal.  Skin:    General: Skin is warm and dry.  Neurological:     Mental Status: She is alert and oriented to person, place, and time.  Psychiatric:        Mood and Affect: Mood normal.        Behavior: Behavior normal.     Ortho Exam  Exam today reveals a minimal effusion.  No warmth or erythema.  No ecchymosis.  She lacks about 5 degrees of full extension flexes to about 90 degrees of flexion.  Crepitance with range of motion.   Specialty Comments:  No specialty comments available.  Imaging:  No results found.   PMFS History: Current Outpatient Medications  Medication Sig Dispense Refill  . acetaminophen (TYLENOL) 500 MG tablet Take 500 mg by mouth every 6 (six) hours as needed for mild pain.    Marland Kitchen aspirin EC 81 MG tablet Take 81 mg by mouth daily.    . diclofenac Sodium (VOLTAREN) 1 % GEL Apply 2 g topically 4 (four) times daily. Rub into affected area of foot 2 to 4 times daily 100 g 0  . EUTHYROX 25 MCG tablet Take 25 mcg by mouth every morning.    . latanoprost (XALATAN) 0.005 % ophthalmic solution     . Levothyroxine Sodium (EUTHYROX PO) Take  25 mg by mouth.     . mupirocin ointment (BACTROBAN) 2 % Apply 1 application topically 2 (two) times daily. 30 g 2  . mupirocin ointment (BACTROBAN) 2 % Apply 1 application topically 2 (two) times daily. 30 g 2  . OLANZAPINE PO Take by mouth.    . traMADol HCl 100 MG TABS      No current facility-administered medications for this visit.    Patient Active Problem List   Diagnosis Date Noted  . Unilateral primary osteoarthritis, right knee 11/24/2019  . Critical limb ischemia with history of revascularization of same extremity ([place]) 04/15/2019  . Primary osteoarthritis, right shoulder 08/03/2018  . Primary osteoarthritis of right knee 01/12/2017  . Takotsubo syndrome 12/08/2014  . S/p reverse total shoulder arthroplasty   . Cardiomyopathy ([place])   . Iron deficiency anemia   . Aspiration pneumonia due to inhalation of milk ([place])   . Secondary cardiomyopathy ([place])   . Acute cystitis without hematuria   . Hypoxia   . Blood poisoning   . Aspiration pneumonia due to food (regurgitated) ([place])   . Chronic kidney disease, stage III (moderate) (HCC)   . UTI (lower urinary tract infection)   . Anemia, iron deficiency   . Acute blood loss anemia   . Acute encephalopathy   . Protein-calorie malnutrition, severe ([place]) 08/01/2014  . Acute systolic heart failure ([place])   . Respiratory failure ([place])   . NSTEMI (non-ST elevated myocardial infarction) ([place])   . Protein-calorie malnutrition ([place])   . Other specified hypothyroidism   . Acute pulmonary edema (HCC)   . Pleural effusion, left   . CKD (chronic kidney disease), stage III ([place]) 07/28/2014  . Metabolic encephalopathy 25/36/6440  . Acute delirium 07/28/2014  . Fever 07/28/2014  . Acute respiratory failure with hypoxia ([place]) 07/28/2014  . Metabolic acidosis 34/74/2595  . Sepsis ([place]) 07/28/2014  . Left rotator cuff tear arthropathy 07/27/2014  . History of iron deficiency anemia 04/14/2013  . Acute renal failure ([place]) 04/14/2013  .  Primary open angle glaucoma 04/08/2012  . History of breast cancer 06/17/2011   Past Medical History:  Diagnosis Date  . Arthritis   . Breast cancer ([place])    breast cancer  . Cardiomyopathy ([place])    a. possibly ischemic >> NSTEMI after should surgery in 07/2014 >>Echo 07/29/14:  EF 65%;  EF 08/01/14:  EF 20-25%, ant-septal AK, Gr 2 DD, mod MR, mod LAE, mod TR, PASP 63 mmHg  . Chronic systolic CHF (congestive heart failure) ([place])   . CKD (chronic kidney disease)   . Dementia ([place])   . History of echocardiogram    Echo 9/16:  Vigorous LVF, EF 65-70%, Gr 1 DD, mild MR, mild LAE, PASP 32 mmHg  . Hypothyroidism     Family History  Problem Relation Age  of Onset  . Brain cancer Mother   . Cancer - Lung Father     Past Surgical History:  Procedure Laterality Date  . ABDOMINAL HYSTERECTOMY    . BACK SURGERY    . BILATERAL CARPAL TUNNEL RELEASE    . CHOLECYSTECTOMY    . EYE SURGERY Right   . KNEE SURGERY    . REVERSE SHOULDER ARTHROPLASTY Left 07/27/2014   Procedure: REVERSE SHOULDER ARTHROPLASTY;  Surgeon: Tania Ade, MD;  Location: [facility];  Service: Orthopedics;  Laterality: Left;  Left reverse total shoulder arthroplasty  . SHOULDER SURGERY Bilateral   . TONSILLECTOMY     Social History   Occupational History  . Not on file  Tobacco Use  . Smoking status: Never Smoker  . Smokeless tobacco: Never Used  Vaping Use  . Vaping Use: Never used  Substance and Sexual Activity  . Alcohol use: No  . Drug use: No  . Sexual activity: Not Currently

## 2020-01-02 ENCOUNTER — Other Ambulatory Visit: Payer: Self-pay

## 2020-01-02 ENCOUNTER — Ambulatory Visit (INDEPENDENT_AMBULATORY_CARE_PROVIDER_SITE_OTHER): Payer: Medicare Other | Admitting: Podiatry

## 2020-01-02 DIAGNOSIS — M79675 Pain in left toe(s): Secondary | ICD-10-CM | POA: Diagnosis not present

## 2020-01-02 DIAGNOSIS — B351 Tinea unguium: Secondary | ICD-10-CM

## 2020-01-02 DIAGNOSIS — I739 Peripheral vascular disease, unspecified: Secondary | ICD-10-CM

## 2020-01-02 DIAGNOSIS — M4800 Spinal stenosis, site unspecified: Secondary | ICD-10-CM | POA: Diagnosis not present

## 2020-01-02 DIAGNOSIS — E039 Hypothyroidism, unspecified: Secondary | ICD-10-CM | POA: Diagnosis not present

## 2020-01-02 DIAGNOSIS — M79674 Pain in right toe(s): Secondary | ICD-10-CM

## 2020-01-02 DIAGNOSIS — F322 Major depressive disorder, single episode, severe without psychotic features: Secondary | ICD-10-CM | POA: Diagnosis not present

## 2020-01-02 DIAGNOSIS — E46 Unspecified protein-calorie malnutrition: Secondary | ICD-10-CM | POA: Diagnosis not present

## 2020-01-02 DIAGNOSIS — L84 Corns and callosities: Secondary | ICD-10-CM | POA: Diagnosis not present

## 2020-01-02 DIAGNOSIS — D638 Anemia in other chronic diseases classified elsewhere: Secondary | ICD-10-CM | POA: Diagnosis not present

## 2020-01-02 DIAGNOSIS — I700 Atherosclerosis of aorta: Secondary | ICD-10-CM | POA: Diagnosis not present

## 2020-01-05 NOTE — Progress Notes (Signed)
Subjective: 86 year old female presents the office today for concerns of thick, discolored toenails that she cannot trim herself and for a callus on the right fifth toe which occasionally causes discomfort. She denies any swelling or redness or drainage or pus. Denies any open sores. Denies any systemic complaints such as fevers, chills, nausea, vomiting. No acute changes since last appointment, and no other complaints at this time.   Objective: AAO x3, NAD DP/PT pulses palpable decreased Nails are hypertrophic, dystrophic, brittle, discolored, elongated 10. No surrounding redness or drainage. Tenderness nails 1-5 bilaterally. No open lesions or pre-ulcerative lesions are identified today. Hyperkeratotic lesions right fifth digit without any underlying ulceration drainage or signs of infection.  No open lesions or pre-ulcerative lesions.  No pain with calf compression, swelling, warmth, erythema Overall exam unchanged  Assessment: 86 year old female with symptomatic onychomycosis, hyperkeratotic lesions  Plan: -All treatment options discussed with the patient including all alternatives, risks, complications.  -Nails debrided x10 without any complications or bleeding. -Hyperkeratotic lesion sharply debrided x1 without any complications or bleeding. -Patient encouraged to call the office with any questions, concerns, change in symptoms.   Vivi Barrack DPM

## 2020-01-30 DIAGNOSIS — R262 Difficulty in walking, not elsewhere classified: Secondary | ICD-10-CM | POA: Diagnosis not present

## 2020-01-30 DIAGNOSIS — Z723 Lack of physical exercise: Secondary | ICD-10-CM | POA: Diagnosis not present

## 2020-01-30 DIAGNOSIS — M545 Low back pain, unspecified: Secondary | ICD-10-CM | POA: Diagnosis not present

## 2020-01-30 DIAGNOSIS — R531 Weakness: Secondary | ICD-10-CM | POA: Diagnosis not present

## 2020-02-06 DIAGNOSIS — M545 Low back pain, unspecified: Secondary | ICD-10-CM | POA: Diagnosis not present

## 2020-02-06 DIAGNOSIS — Z723 Lack of physical exercise: Secondary | ICD-10-CM | POA: Diagnosis not present

## 2020-02-06 DIAGNOSIS — R531 Weakness: Secondary | ICD-10-CM | POA: Diagnosis not present

## 2020-02-06 DIAGNOSIS — R262 Difficulty in walking, not elsewhere classified: Secondary | ICD-10-CM | POA: Diagnosis not present

## 2020-02-13 DIAGNOSIS — M48062 Spinal stenosis, lumbar region with neurogenic claudication: Secondary | ICD-10-CM | POA: Diagnosis not present

## 2020-02-15 DIAGNOSIS — Z723 Lack of physical exercise: Secondary | ICD-10-CM | POA: Diagnosis not present

## 2020-02-15 DIAGNOSIS — M545 Low back pain, unspecified: Secondary | ICD-10-CM | POA: Diagnosis not present

## 2020-02-15 DIAGNOSIS — R531 Weakness: Secondary | ICD-10-CM | POA: Diagnosis not present

## 2020-02-15 DIAGNOSIS — R262 Difficulty in walking, not elsewhere classified: Secondary | ICD-10-CM | POA: Diagnosis not present

## 2020-02-21 DIAGNOSIS — H401133 Primary open-angle glaucoma, bilateral, severe stage: Secondary | ICD-10-CM | POA: Diagnosis not present

## 2020-02-29 DIAGNOSIS — M810 Age-related osteoporosis without current pathological fracture: Secondary | ICD-10-CM | POA: Diagnosis not present

## 2020-03-01 DIAGNOSIS — M545 Low back pain, unspecified: Secondary | ICD-10-CM | POA: Diagnosis not present

## 2020-03-01 DIAGNOSIS — R531 Weakness: Secondary | ICD-10-CM | POA: Diagnosis not present

## 2020-03-01 DIAGNOSIS — R262 Difficulty in walking, not elsewhere classified: Secondary | ICD-10-CM | POA: Diagnosis not present

## 2020-03-01 DIAGNOSIS — Z723 Lack of physical exercise: Secondary | ICD-10-CM | POA: Diagnosis not present

## 2020-03-05 ENCOUNTER — Ambulatory Visit (INDEPENDENT_AMBULATORY_CARE_PROVIDER_SITE_OTHER): Payer: Medicare Other | Admitting: Podiatry

## 2020-03-05 ENCOUNTER — Other Ambulatory Visit: Payer: Self-pay

## 2020-03-05 DIAGNOSIS — L84 Corns and callosities: Secondary | ICD-10-CM | POA: Diagnosis not present

## 2020-03-05 DIAGNOSIS — B351 Tinea unguium: Secondary | ICD-10-CM

## 2020-03-05 DIAGNOSIS — M79674 Pain in right toe(s): Secondary | ICD-10-CM | POA: Diagnosis not present

## 2020-03-05 DIAGNOSIS — M79675 Pain in left toe(s): Secondary | ICD-10-CM | POA: Diagnosis not present

## 2020-03-05 DIAGNOSIS — I739 Peripheral vascular disease, unspecified: Secondary | ICD-10-CM | POA: Diagnosis not present

## 2020-03-06 DIAGNOSIS — M48062 Spinal stenosis, lumbar region with neurogenic claudication: Secondary | ICD-10-CM | POA: Diagnosis not present

## 2020-03-06 DIAGNOSIS — M5416 Radiculopathy, lumbar region: Secondary | ICD-10-CM | POA: Diagnosis not present

## 2020-03-07 NOTE — Progress Notes (Signed)
Subjective: 87 year old female presents the office today for concerns of thick, discolored toenails that she cannot trim herself and for a callus. She denies any swelling or redness or drainage or pus. Denies any open wounds that she reports. Denies any systemic complaints such as fevers, chills, nausea, vomiting. No acute changes since last appointment, and no other complaints at this time.   Objective: NAD DP/PT pulses palpable decreased Nails are hypertrophic, dystrophic, brittle, discolored, elongated 10. No surrounding redness or drainage. Tenderness nails 1-5 bilaterally. No open lesions or pre-ulcerative lesions are identified today. Hyperkeratotic lesions right fifth digit without any underlying ulceration drainage or signs of infection.  No open lesions or pre-ulcerative lesions.  No pain with calf compression, swelling, warmth, erythema  Assessment: 87 year old female with symptomatic onychomycosis, hyperkeratotic lesions  Plan: -All treatment options discussed with the patient including all alternatives, risks, complications.  -Nails debrided x10 without any complications or bleeding. -Hyperkeratotic lesion sharply debrided x1 without any complications or bleeding. -Patient encouraged to call the office with any questions, concerns, change in symptoms.   Trula Slade DPM

## 2020-03-12 DIAGNOSIS — R531 Weakness: Secondary | ICD-10-CM | POA: Diagnosis not present

## 2020-03-12 DIAGNOSIS — Z723 Lack of physical exercise: Secondary | ICD-10-CM | POA: Diagnosis not present

## 2020-03-12 DIAGNOSIS — M545 Low back pain, unspecified: Secondary | ICD-10-CM | POA: Diagnosis not present

## 2020-03-12 DIAGNOSIS — R262 Difficulty in walking, not elsewhere classified: Secondary | ICD-10-CM | POA: Diagnosis not present

## 2020-03-26 DIAGNOSIS — R262 Difficulty in walking, not elsewhere classified: Secondary | ICD-10-CM | POA: Diagnosis not present

## 2020-03-26 DIAGNOSIS — M545 Low back pain, unspecified: Secondary | ICD-10-CM | POA: Diagnosis not present

## 2020-03-26 DIAGNOSIS — Z723 Lack of physical exercise: Secondary | ICD-10-CM | POA: Diagnosis not present

## 2020-03-26 DIAGNOSIS — R531 Weakness: Secondary | ICD-10-CM | POA: Diagnosis not present

## 2020-04-09 DIAGNOSIS — Z723 Lack of physical exercise: Secondary | ICD-10-CM | POA: Diagnosis not present

## 2020-04-09 DIAGNOSIS — M545 Low back pain, unspecified: Secondary | ICD-10-CM | POA: Diagnosis not present

## 2020-04-09 DIAGNOSIS — R262 Difficulty in walking, not elsewhere classified: Secondary | ICD-10-CM | POA: Diagnosis not present

## 2020-04-09 DIAGNOSIS — R531 Weakness: Secondary | ICD-10-CM | POA: Diagnosis not present

## 2020-04-25 DIAGNOSIS — R531 Weakness: Secondary | ICD-10-CM | POA: Diagnosis not present

## 2020-04-25 DIAGNOSIS — M545 Low back pain, unspecified: Secondary | ICD-10-CM | POA: Diagnosis not present

## 2020-04-25 DIAGNOSIS — R262 Difficulty in walking, not elsewhere classified: Secondary | ICD-10-CM | POA: Diagnosis not present

## 2020-04-25 DIAGNOSIS — Z723 Lack of physical exercise: Secondary | ICD-10-CM | POA: Diagnosis not present

## 2020-04-30 DIAGNOSIS — M5416 Radiculopathy, lumbar region: Secondary | ICD-10-CM | POA: Diagnosis not present

## 2020-05-02 DIAGNOSIS — K219 Gastro-esophageal reflux disease without esophagitis: Secondary | ICD-10-CM | POA: Diagnosis not present

## 2020-05-02 DIAGNOSIS — E46 Unspecified protein-calorie malnutrition: Secondary | ICD-10-CM | POA: Diagnosis not present

## 2020-05-02 DIAGNOSIS — M549 Dorsalgia, unspecified: Secondary | ICD-10-CM | POA: Diagnosis not present

## 2020-05-02 DIAGNOSIS — M810 Age-related osteoporosis without current pathological fracture: Secondary | ICD-10-CM | POA: Diagnosis not present

## 2020-05-02 DIAGNOSIS — H409 Unspecified glaucoma: Secondary | ICD-10-CM | POA: Diagnosis not present

## 2020-05-02 DIAGNOSIS — Z0000 Encounter for general adult medical examination without abnormal findings: Secondary | ICD-10-CM | POA: Diagnosis not present

## 2020-05-02 DIAGNOSIS — I10 Essential (primary) hypertension: Secondary | ICD-10-CM | POA: Diagnosis not present

## 2020-05-02 DIAGNOSIS — E039 Hypothyroidism, unspecified: Secondary | ICD-10-CM | POA: Diagnosis not present

## 2020-05-02 DIAGNOSIS — M21371 Foot drop, right foot: Secondary | ICD-10-CM | POA: Diagnosis not present

## 2020-05-02 DIAGNOSIS — D649 Anemia, unspecified: Secondary | ICD-10-CM | POA: Diagnosis not present

## 2020-05-02 DIAGNOSIS — D692 Other nonthrombocytopenic purpura: Secondary | ICD-10-CM | POA: Diagnosis not present

## 2020-05-02 DIAGNOSIS — F322 Major depressive disorder, single episode, severe without psychotic features: Secondary | ICD-10-CM | POA: Diagnosis not present

## 2020-05-03 ENCOUNTER — Other Ambulatory Visit: Payer: Self-pay

## 2020-05-03 ENCOUNTER — Ambulatory Visit (INDEPENDENT_AMBULATORY_CARE_PROVIDER_SITE_OTHER): Payer: Medicare Other | Admitting: Podiatry

## 2020-05-03 DIAGNOSIS — I739 Peripheral vascular disease, unspecified: Secondary | ICD-10-CM | POA: Diagnosis not present

## 2020-05-03 DIAGNOSIS — M79675 Pain in left toe(s): Secondary | ICD-10-CM | POA: Diagnosis not present

## 2020-05-03 DIAGNOSIS — L97502 Non-pressure chronic ulcer of other part of unspecified foot with fat layer exposed: Secondary | ICD-10-CM | POA: Diagnosis not present

## 2020-05-03 DIAGNOSIS — B351 Tinea unguium: Secondary | ICD-10-CM

## 2020-05-03 DIAGNOSIS — M79674 Pain in right toe(s): Secondary | ICD-10-CM

## 2020-05-03 MED ORDER — MUPIROCIN 2 % EX OINT: 1.0000 "application " | TOPICAL_OINTMENT | Freq: Two times a day (BID) | CUTANEOUS | 2 refills | Status: DC

## 2020-05-07 DIAGNOSIS — M545 Low back pain, unspecified: Secondary | ICD-10-CM | POA: Diagnosis not present

## 2020-05-07 DIAGNOSIS — R262 Difficulty in walking, not elsewhere classified: Secondary | ICD-10-CM | POA: Diagnosis not present

## 2020-05-07 DIAGNOSIS — R531 Weakness: Secondary | ICD-10-CM | POA: Diagnosis not present

## 2020-05-07 DIAGNOSIS — Z723 Lack of physical exercise: Secondary | ICD-10-CM | POA: Diagnosis not present

## 2020-05-07 NOTE — Progress Notes (Signed)
Subjective: 87 year old female presents the office today for concerns of thick, discolored toenails that she cannot trim herself and for a callus. She denies any swelling or redness or drainage or pus.  She also gets a callus on her right fifth toe.  Her daughter states that she tries to trim it herself at times.  Denies any open sores or any swelling or redness or drainage.  Denies any systemic complaints such as fevers, chills, nausea, vomiting. No acute changes since last appointment, and no other complaints at this time.   Objective: NAD DP/PT pulses palpable decreased Nails are hypertrophic, dystrophic, brittle, discolored, elongated 10. No surrounding redness or drainage. Tenderness nails 1-5 bilaterally. No open lesions or pre-ulcerative lesions are identified today. Hyperkeratotic lesions right fifth digit and upon debridement superficial granular wound is present without any probing to bone, undermining tunneling.  There is no surrounding erythema, ascending cellulitis there is no fluctuance or crepitation.  There is no malodor. No open lesions or pre-ulcerative lesions.  No pain with calf compression, swelling, warmth, erythema  Assessment: 87 year old female with symptomatic onychomycosis, hyperkeratotic lesions  Plan: -All treatment options discussed with the patient including all alternatives, risks, complications.  -Nails debrided x10 without any complications or bleeding. -Should be debrided the hyperkeratotic lesion without any complications but did reveal underlying ulceration.  Debrided the wound down to healthy, viable tissue to remove nonviable tissue utilizing the 312 with scalpel to any complications.  No blood loss.  Tolerated well.  No signs of infection.  Recommend antibiotic ointment dressing changes daily.  Offloading.  If no improvement will refer to vascular surgery. -Patient encouraged to call the office with any questions, concerns, change in symptoms.   Trula Slade DPM

## 2020-05-17 ENCOUNTER — Other Ambulatory Visit: Payer: Self-pay

## 2020-05-17 ENCOUNTER — Ambulatory Visit (INDEPENDENT_AMBULATORY_CARE_PROVIDER_SITE_OTHER): Payer: Medicare Other | Admitting: Podiatry

## 2020-05-17 DIAGNOSIS — L97502 Non-pressure chronic ulcer of other part of unspecified foot with fat layer exposed: Secondary | ICD-10-CM

## 2020-05-17 DIAGNOSIS — L97518 Non-pressure chronic ulcer of other part of right foot with other specified severity: Secondary | ICD-10-CM

## 2020-05-21 DIAGNOSIS — R531 Weakness: Secondary | ICD-10-CM | POA: Diagnosis not present

## 2020-05-21 DIAGNOSIS — Z723 Lack of physical exercise: Secondary | ICD-10-CM | POA: Diagnosis not present

## 2020-05-21 DIAGNOSIS — M545 Low back pain, unspecified: Secondary | ICD-10-CM | POA: Diagnosis not present

## 2020-05-21 DIAGNOSIS — R262 Difficulty in walking, not elsewhere classified: Secondary | ICD-10-CM | POA: Diagnosis not present

## 2020-05-22 NOTE — Progress Notes (Signed)
Subjective: 87 year old female presents the office today with her daughter for follow-up evaluation of a wound on the lateral aspect the fifth toe along the PIPJ.  Reports that the area is looking better.  Denies any drainage or pus or increase in swelling or redness to the foot. Denies any systemic complaints such as fevers, chills, nausea, vomiting. No acute changes since last appointment, and no other complaints at this time.   Objective: AAO x3, NAD DP/PT pulses palpable bilaterally, CRT less than 3 seconds Hyperkeratotic lesion dorsal lateral aspect the right third toe on the PIPJ.  Upon debridement superficial granular wound is present without any probing to bone, undermining or tunneling.  No concerning erythema, ascending cellulitis.  There is no fluctuance crepitation.  No malodor.  The wound appears to be most healed.  No pain with calf compression, swelling, warmth, erythema  Assessment: Ulceration right third toe with improvement  Plan: -All treatment options discussed with the patient including all alternatives, risks, complications.  -Sharply debrided the hyperkeratotic lesion without underlying ulceration.  Overall the wound appears to be doing better.  Continue antibiotic ointment dressing changes daily.  Continue offloading. -Monitor for any clinical signs or symptoms of infection and directed to call the office immediately should any occur or go to the ER. -Patient encouraged to call the office with any questions, concerns, change in symptoms.   Trula Slade DPM

## 2020-05-30 DIAGNOSIS — M5416 Radiculopathy, lumbar region: Secondary | ICD-10-CM | POA: Diagnosis not present

## 2020-05-30 DIAGNOSIS — M48062 Spinal stenosis, lumbar region with neurogenic claudication: Secondary | ICD-10-CM | POA: Diagnosis not present

## 2020-05-30 DIAGNOSIS — R030 Elevated blood-pressure reading, without diagnosis of hypertension: Secondary | ICD-10-CM | POA: Diagnosis not present

## 2020-05-30 DIAGNOSIS — M4316 Spondylolisthesis, lumbar region: Secondary | ICD-10-CM | POA: Diagnosis not present

## 2020-06-06 DIAGNOSIS — Z723 Lack of physical exercise: Secondary | ICD-10-CM | POA: Diagnosis not present

## 2020-06-06 DIAGNOSIS — R531 Weakness: Secondary | ICD-10-CM | POA: Diagnosis not present

## 2020-06-06 DIAGNOSIS — M545 Low back pain, unspecified: Secondary | ICD-10-CM | POA: Diagnosis not present

## 2020-06-06 DIAGNOSIS — R262 Difficulty in walking, not elsewhere classified: Secondary | ICD-10-CM | POA: Diagnosis not present

## 2020-06-12 ENCOUNTER — Ambulatory Visit: Payer: Medicare Other | Admitting: Podiatry

## 2020-06-20 DIAGNOSIS — M545 Low back pain, unspecified: Secondary | ICD-10-CM | POA: Diagnosis not present

## 2020-06-20 DIAGNOSIS — R262 Difficulty in walking, not elsewhere classified: Secondary | ICD-10-CM | POA: Diagnosis not present

## 2020-06-20 DIAGNOSIS — Z723 Lack of physical exercise: Secondary | ICD-10-CM | POA: Diagnosis not present

## 2020-06-20 DIAGNOSIS — R531 Weakness: Secondary | ICD-10-CM | POA: Diagnosis not present

## 2020-06-21 ENCOUNTER — Other Ambulatory Visit: Payer: Self-pay

## 2020-06-21 ENCOUNTER — Encounter: Payer: Self-pay | Admitting: Podiatry

## 2020-06-21 ENCOUNTER — Ambulatory Visit (INDEPENDENT_AMBULATORY_CARE_PROVIDER_SITE_OTHER): Payer: Medicare Other | Admitting: Podiatry

## 2020-06-21 DIAGNOSIS — L84 Corns and callosities: Secondary | ICD-10-CM | POA: Diagnosis not present

## 2020-06-21 DIAGNOSIS — L97502 Non-pressure chronic ulcer of other part of unspecified foot with fat layer exposed: Secondary | ICD-10-CM

## 2020-06-21 NOTE — Progress Notes (Signed)
Subjective:   Patient ID: Andrea Gilmore, female   DOB: 87 y.o.   MRN: [id number]   HPI Patient presents with caregiver stating that it seems to be continuously getting better and I know there is crusted tissue but I need it looked at again   ROS      Objective:  Physical Exam  Chronic ulceration of the right fifth digit that does still have lesion formation and is concerning to her with patient wearing braces due to foot drop     Assessment:  Chronic ulceration right fifth digit with circulatory issue is complicating factor     Plan:  Sterile debridement of the area no iatrogenic bleeding and it is appear to be closing up now with no indications of drainage currently discussed ulceration and hopefully this will be the end of the problem and continue with shoe gear modification

## 2020-07-11 DIAGNOSIS — R262 Difficulty in walking, not elsewhere classified: Secondary | ICD-10-CM | POA: Diagnosis not present

## 2020-07-11 DIAGNOSIS — Z723 Lack of physical exercise: Secondary | ICD-10-CM | POA: Diagnosis not present

## 2020-07-11 DIAGNOSIS — M545 Low back pain, unspecified: Secondary | ICD-10-CM | POA: Diagnosis not present

## 2020-07-11 DIAGNOSIS — R531 Weakness: Secondary | ICD-10-CM | POA: Diagnosis not present

## 2020-07-13 ENCOUNTER — Ambulatory Visit (INDEPENDENT_AMBULATORY_CARE_PROVIDER_SITE_OTHER): Payer: Medicare Other | Admitting: Podiatry

## 2020-07-13 ENCOUNTER — Other Ambulatory Visit: Payer: Self-pay

## 2020-07-13 DIAGNOSIS — I739 Peripheral vascular disease, unspecified: Secondary | ICD-10-CM | POA: Diagnosis not present

## 2020-07-13 DIAGNOSIS — M79674 Pain in right toe(s): Secondary | ICD-10-CM | POA: Diagnosis not present

## 2020-07-13 DIAGNOSIS — M79675 Pain in left toe(s): Secondary | ICD-10-CM | POA: Diagnosis not present

## 2020-07-13 DIAGNOSIS — L84 Corns and callosities: Secondary | ICD-10-CM | POA: Diagnosis not present

## 2020-07-13 DIAGNOSIS — B351 Tinea unguium: Secondary | ICD-10-CM

## 2020-07-18 NOTE — Progress Notes (Signed)
Subjective: 87 year old female presents the office today for concerns of thick, long toenails she cannot trim her self.  Denies any swelling or redness or drainage to the toenail sites.  She started to have some discomfort of the right fifth toe again but denies any opening. Denies any systemic complaints such as fevers, chills, nausea, vomiting. No acute changes since last appointment, and no other complaints at this time.   Objective: AAO x3, NAD DP/PT pulses decreased bilaterally Nails are hypertrophic, dystrophic, brittle, discolored, elongated 10. No surrounding redness or drainage. Tenderness nails 1-5 bilaterally. No open lesions or pre-ulcerative lesions are identified today. Hyperkeratotic lesion dorsal lateral right fifth toe.  Upon debridement is preulcerative.  No ulceration identified.  No drainage or pus. No pain with calf compression, swelling, warmth, erythema  Assessment: Symptomatic onychomycosis, preulcerative callus right third toe  Plan: -All treatment options discussed with the patient including all alternatives, risks, complications.  -Sharply debrided the nails x10 without any complications or bleeding -Sharply debrided hyperkeratotic lesion x1 without any complications or bleeding.  Continue offloading daily. -Patient encouraged to call the office with any questions, concerns, change in symptoms.   Trula Slade DPM

## 2020-07-23 DIAGNOSIS — R262 Difficulty in walking, not elsewhere classified: Secondary | ICD-10-CM | POA: Diagnosis not present

## 2020-07-23 DIAGNOSIS — M545 Low back pain, unspecified: Secondary | ICD-10-CM | POA: Diagnosis not present

## 2020-07-23 DIAGNOSIS — Z723 Lack of physical exercise: Secondary | ICD-10-CM | POA: Diagnosis not present

## 2020-07-23 DIAGNOSIS — R531 Weakness: Secondary | ICD-10-CM | POA: Diagnosis not present

## 2020-08-02 DIAGNOSIS — M5416 Radiculopathy, lumbar region: Secondary | ICD-10-CM | POA: Diagnosis not present

## 2020-08-07 DIAGNOSIS — R262 Difficulty in walking, not elsewhere classified: Secondary | ICD-10-CM | POA: Diagnosis not present

## 2020-08-07 DIAGNOSIS — Z723 Lack of physical exercise: Secondary | ICD-10-CM | POA: Diagnosis not present

## 2020-08-07 DIAGNOSIS — M545 Low back pain, unspecified: Secondary | ICD-10-CM | POA: Diagnosis not present

## 2020-08-07 DIAGNOSIS — R531 Weakness: Secondary | ICD-10-CM | POA: Diagnosis not present

## 2020-08-16 ENCOUNTER — Ambulatory Visit (INDEPENDENT_AMBULATORY_CARE_PROVIDER_SITE_OTHER): Payer: Medicare Other | Admitting: Podiatry

## 2020-08-16 ENCOUNTER — Other Ambulatory Visit: Payer: Self-pay

## 2020-08-16 DIAGNOSIS — L97512 Non-pressure chronic ulcer of other part of right foot with fat layer exposed: Secondary | ICD-10-CM

## 2020-08-21 DIAGNOSIS — M48062 Spinal stenosis, lumbar region with neurogenic claudication: Secondary | ICD-10-CM | POA: Diagnosis not present

## 2020-08-21 DIAGNOSIS — Z981 Arthrodesis status: Secondary | ICD-10-CM | POA: Diagnosis not present

## 2020-08-22 DIAGNOSIS — H5203 Hypermetropia, bilateral: Secondary | ICD-10-CM | POA: Diagnosis not present

## 2020-08-22 DIAGNOSIS — H401133 Primary open-angle glaucoma, bilateral, severe stage: Secondary | ICD-10-CM | POA: Diagnosis not present

## 2020-08-22 DIAGNOSIS — Z961 Presence of intraocular lens: Secondary | ICD-10-CM | POA: Diagnosis not present

## 2020-08-22 NOTE — Progress Notes (Signed)
Subjective: 87 year old female presents the office today for follow-up evaluation of a preulcerative callus on the right fifth toe.  Her daughter states that she has not been walking as much and thinks the toe may be doing better because of this.  Her daughter states it looks better overall and seems to be healing.  Denies any drainage or pus or any swelling or redness.  No fevers or chills.  She has no other concerns.  Objective: AAO x3, NAD DP/PT pulses decreased bilaterally On the dorsal lateral aspect the right fifth toe is a hyperkeratotic lesion.  Upon debridement superficial granular wound is present without any probing to bone, undermining or tunneling.  There is no surrounding erythema, drainage or pus or ascending cellulitis.  There is no fluctuance or crepitation with no malodor. No pain with calf compression, swelling, warmth, erythema  Assessment: Reoccurrence wound right fifth toe  Plan: -All treatment options discussed with the patient including all alternatives, risks, complications.  -Debrided hyperkeratotic lesion right fifth toe to reveal underlying ulceration.  Superficial without any probing, undermining.  No obvious signs of infection.  Recommended a small amount of antibiotic ointment dressing changes daily and offloading.  Monitor closely for any signs or symptoms of infection. -Patient encouraged to call the office with any questions, concerns, change in symptoms.   Trula Slade DPM

## 2020-08-23 DIAGNOSIS — M545 Low back pain, unspecified: Secondary | ICD-10-CM | POA: Diagnosis not present

## 2020-08-23 DIAGNOSIS — R531 Weakness: Secondary | ICD-10-CM | POA: Diagnosis not present

## 2020-08-23 DIAGNOSIS — Z723 Lack of physical exercise: Secondary | ICD-10-CM | POA: Diagnosis not present

## 2020-08-23 DIAGNOSIS — R262 Difficulty in walking, not elsewhere classified: Secondary | ICD-10-CM | POA: Diagnosis not present

## 2020-08-29 DIAGNOSIS — M4800 Spinal stenosis, site unspecified: Secondary | ICD-10-CM | POA: Diagnosis not present

## 2020-08-29 DIAGNOSIS — M810 Age-related osteoporosis without current pathological fracture: Secondary | ICD-10-CM | POA: Diagnosis not present

## 2020-08-29 DIAGNOSIS — E46 Unspecified protein-calorie malnutrition: Secondary | ICD-10-CM | POA: Diagnosis not present

## 2020-08-29 DIAGNOSIS — D692 Other nonthrombocytopenic purpura: Secondary | ICD-10-CM | POA: Diagnosis not present

## 2020-08-29 DIAGNOSIS — E039 Hypothyroidism, unspecified: Secondary | ICD-10-CM | POA: Diagnosis not present

## 2020-08-29 DIAGNOSIS — D649 Anemia, unspecified: Secondary | ICD-10-CM | POA: Diagnosis not present

## 2020-08-29 DIAGNOSIS — M549 Dorsalgia, unspecified: Secondary | ICD-10-CM | POA: Diagnosis not present

## 2020-08-29 DIAGNOSIS — I10 Essential (primary) hypertension: Secondary | ICD-10-CM | POA: Diagnosis not present

## 2020-09-03 DIAGNOSIS — Z723 Lack of physical exercise: Secondary | ICD-10-CM | POA: Diagnosis not present

## 2020-09-03 DIAGNOSIS — M545 Low back pain, unspecified: Secondary | ICD-10-CM | POA: Diagnosis not present

## 2020-09-03 DIAGNOSIS — R531 Weakness: Secondary | ICD-10-CM | POA: Diagnosis not present

## 2020-09-03 DIAGNOSIS — R262 Difficulty in walking, not elsewhere classified: Secondary | ICD-10-CM | POA: Diagnosis not present

## 2020-09-06 ENCOUNTER — Ambulatory Visit (INDEPENDENT_AMBULATORY_CARE_PROVIDER_SITE_OTHER): Payer: Medicare Other | Admitting: Podiatry

## 2020-09-06 ENCOUNTER — Other Ambulatory Visit: Payer: Self-pay

## 2020-09-06 DIAGNOSIS — B351 Tinea unguium: Secondary | ICD-10-CM

## 2020-09-06 DIAGNOSIS — L97511 Non-pressure chronic ulcer of other part of right foot limited to breakdown of skin: Secondary | ICD-10-CM

## 2020-09-06 DIAGNOSIS — M79675 Pain in left toe(s): Secondary | ICD-10-CM

## 2020-09-06 DIAGNOSIS — M79674 Pain in right toe(s): Secondary | ICD-10-CM

## 2020-09-06 DIAGNOSIS — L84 Corns and callosities: Secondary | ICD-10-CM | POA: Diagnosis not present

## 2020-09-12 NOTE — Progress Notes (Signed)
Subjective: 87 year old female presents the office today for concerns of thick, long toenails she cannot trim her self.  Also associate some pain to the right fifth toe.  Her daughter states that she has not been as active recently which is probably why the toe had healed and the calluses not come back as much.  No swelling redness or any drainage.  Objective: AAO x3, NAD DP/PT pulses decreased bilaterally Nails are hypertrophic, dystrophic, brittle, discolored, elongated 10. No surrounding redness or drainage. Tenderness nails 1-5 bilaterally. No open lesions or pre-ulcerative lesions are identified today. Hyperkeratotic lesion dorsal lateral right fifth toe.  Upon debridement there is a small superficial area skin breakdown.  There is no edema, erythema, drainage or pus.  No probing, undermining or tunneling.  No obvious signs of infection. No pain with calf compression, swelling, warmth, erythema  Assessment: Symptomatic onychomycosis, superficial ulcer right third toe  Plan: -All treatment options discussed with the patient including all alternatives, risks, complications.  -Sharply debrided the nails x10 without any complications or bleeding -Sharply debrided hyperkeratotic lesion x1 to reveal a superficial small wound present.  Recommended a small amount of antibiotic ointment dressing changes daily.  The wound is healed on its own as he is continue to heal.  However does not heal the next 2 weeks let me know worsening there is any signs or symptoms of infection or any worsening prior. -Patient encouraged to call the office with any questions, concerns, change in symptoms.   Trula Slade DPM

## 2020-09-17 DIAGNOSIS — M545 Low back pain, unspecified: Secondary | ICD-10-CM | POA: Diagnosis not present

## 2020-09-17 DIAGNOSIS — Z723 Lack of physical exercise: Secondary | ICD-10-CM | POA: Diagnosis not present

## 2020-09-17 DIAGNOSIS — R262 Difficulty in walking, not elsewhere classified: Secondary | ICD-10-CM | POA: Diagnosis not present

## 2020-09-17 DIAGNOSIS — R531 Weakness: Secondary | ICD-10-CM | POA: Diagnosis not present

## 2020-10-02 DIAGNOSIS — R262 Difficulty in walking, not elsewhere classified: Secondary | ICD-10-CM | POA: Diagnosis not present

## 2020-10-02 DIAGNOSIS — Z723 Lack of physical exercise: Secondary | ICD-10-CM | POA: Diagnosis not present

## 2020-10-02 DIAGNOSIS — M545 Low back pain, unspecified: Secondary | ICD-10-CM | POA: Diagnosis not present

## 2020-10-02 DIAGNOSIS — R531 Weakness: Secondary | ICD-10-CM | POA: Diagnosis not present

## 2020-10-15 DIAGNOSIS — R531 Weakness: Secondary | ICD-10-CM | POA: Diagnosis not present

## 2020-10-15 DIAGNOSIS — M545 Low back pain, unspecified: Secondary | ICD-10-CM | POA: Diagnosis not present

## 2020-10-15 DIAGNOSIS — R262 Difficulty in walking, not elsewhere classified: Secondary | ICD-10-CM | POA: Diagnosis not present

## 2020-10-15 DIAGNOSIS — Z723 Lack of physical exercise: Secondary | ICD-10-CM | POA: Diagnosis not present

## 2020-10-29 DIAGNOSIS — Z723 Lack of physical exercise: Secondary | ICD-10-CM | POA: Diagnosis not present

## 2020-10-29 DIAGNOSIS — R531 Weakness: Secondary | ICD-10-CM | POA: Diagnosis not present

## 2020-10-29 DIAGNOSIS — R262 Difficulty in walking, not elsewhere classified: Secondary | ICD-10-CM | POA: Diagnosis not present

## 2020-10-29 DIAGNOSIS — M545 Low back pain, unspecified: Secondary | ICD-10-CM | POA: Diagnosis not present

## 2020-11-08 DIAGNOSIS — M48062 Spinal stenosis, lumbar region with neurogenic claudication: Secondary | ICD-10-CM | POA: Diagnosis not present

## 2020-11-12 ENCOUNTER — Ambulatory Visit: Payer: Medicare Other | Admitting: Podiatry

## 2020-11-27 ENCOUNTER — Ambulatory Visit: Payer: Medicare Other | Admitting: Podiatry

## 2020-12-06 DIAGNOSIS — Z23 Encounter for immunization: Secondary | ICD-10-CM | POA: Diagnosis not present

## 2020-12-07 DIAGNOSIS — Z23 Encounter for immunization: Secondary | ICD-10-CM | POA: Diagnosis not present

## 2020-12-10 DIAGNOSIS — E46 Unspecified protein-calorie malnutrition: Secondary | ICD-10-CM | POA: Diagnosis not present

## 2020-12-10 DIAGNOSIS — M549 Dorsalgia, unspecified: Secondary | ICD-10-CM | POA: Diagnosis not present

## 2020-12-10 DIAGNOSIS — E039 Hypothyroidism, unspecified: Secondary | ICD-10-CM | POA: Diagnosis not present

## 2020-12-10 DIAGNOSIS — M810 Age-related osteoporosis without current pathological fracture: Secondary | ICD-10-CM | POA: Diagnosis not present

## 2020-12-10 DIAGNOSIS — M4800 Spinal stenosis, site unspecified: Secondary | ICD-10-CM | POA: Diagnosis not present

## 2020-12-11 DIAGNOSIS — Z981 Arthrodesis status: Secondary | ICD-10-CM | POA: Diagnosis not present

## 2020-12-11 DIAGNOSIS — R030 Elevated blood-pressure reading, without diagnosis of hypertension: Secondary | ICD-10-CM | POA: Diagnosis not present

## 2020-12-11 DIAGNOSIS — M5416 Radiculopathy, lumbar region: Secondary | ICD-10-CM | POA: Diagnosis not present

## 2020-12-11 DIAGNOSIS — M48062 Spinal stenosis, lumbar region with neurogenic claudication: Secondary | ICD-10-CM | POA: Diagnosis not present

## 2020-12-13 DIAGNOSIS — R531 Weakness: Secondary | ICD-10-CM | POA: Diagnosis not present

## 2020-12-13 DIAGNOSIS — R262 Difficulty in walking, not elsewhere classified: Secondary | ICD-10-CM | POA: Diagnosis not present

## 2020-12-13 DIAGNOSIS — Z723 Lack of physical exercise: Secondary | ICD-10-CM | POA: Diagnosis not present

## 2020-12-13 DIAGNOSIS — M545 Low back pain, unspecified: Secondary | ICD-10-CM | POA: Diagnosis not present

## 2020-12-24 DIAGNOSIS — Z723 Lack of physical exercise: Secondary | ICD-10-CM | POA: Diagnosis not present

## 2020-12-24 DIAGNOSIS — M545 Low back pain, unspecified: Secondary | ICD-10-CM | POA: Diagnosis not present

## 2020-12-24 DIAGNOSIS — R531 Weakness: Secondary | ICD-10-CM | POA: Diagnosis not present

## 2020-12-24 DIAGNOSIS — R262 Difficulty in walking, not elsewhere classified: Secondary | ICD-10-CM | POA: Diagnosis not present

## 2021-01-01 DIAGNOSIS — M545 Low back pain, unspecified: Secondary | ICD-10-CM | POA: Diagnosis not present

## 2021-01-01 DIAGNOSIS — R262 Difficulty in walking, not elsewhere classified: Secondary | ICD-10-CM | POA: Diagnosis not present

## 2021-01-01 DIAGNOSIS — R531 Weakness: Secondary | ICD-10-CM | POA: Diagnosis not present

## 2021-01-01 DIAGNOSIS — Z723 Lack of physical exercise: Secondary | ICD-10-CM | POA: Diagnosis not present

## 2021-01-08 DIAGNOSIS — M5416 Radiculopathy, lumbar region: Secondary | ICD-10-CM | POA: Diagnosis not present

## 2021-01-08 DIAGNOSIS — M48062 Spinal stenosis, lumbar region with neurogenic claudication: Secondary | ICD-10-CM | POA: Diagnosis not present

## 2021-01-10 DIAGNOSIS — R262 Difficulty in walking, not elsewhere classified: Secondary | ICD-10-CM | POA: Diagnosis not present

## 2021-01-10 DIAGNOSIS — Z723 Lack of physical exercise: Secondary | ICD-10-CM | POA: Diagnosis not present

## 2021-01-10 DIAGNOSIS — M545 Low back pain, unspecified: Secondary | ICD-10-CM | POA: Diagnosis not present

## 2021-01-10 DIAGNOSIS — R531 Weakness: Secondary | ICD-10-CM | POA: Diagnosis not present

## 2021-01-24 DIAGNOSIS — Z723 Lack of physical exercise: Secondary | ICD-10-CM | POA: Diagnosis not present

## 2021-01-24 DIAGNOSIS — M545 Low back pain, unspecified: Secondary | ICD-10-CM | POA: Diagnosis not present

## 2021-01-24 DIAGNOSIS — R262 Difficulty in walking, not elsewhere classified: Secondary | ICD-10-CM | POA: Diagnosis not present

## 2021-01-24 DIAGNOSIS — R531 Weakness: Secondary | ICD-10-CM | POA: Diagnosis not present

## 2021-01-31 DIAGNOSIS — R531 Weakness: Secondary | ICD-10-CM | POA: Diagnosis not present

## 2021-01-31 DIAGNOSIS — R262 Difficulty in walking, not elsewhere classified: Secondary | ICD-10-CM | POA: Diagnosis not present

## 2021-01-31 DIAGNOSIS — M545 Low back pain, unspecified: Secondary | ICD-10-CM | POA: Diagnosis not present

## 2021-01-31 DIAGNOSIS — Z723 Lack of physical exercise: Secondary | ICD-10-CM | POA: Diagnosis not present

## 2021-02-07 DIAGNOSIS — M5414 Radiculopathy, thoracic region: Secondary | ICD-10-CM | POA: Diagnosis not present

## 2021-02-14 DIAGNOSIS — Z723 Lack of physical exercise: Secondary | ICD-10-CM | POA: Diagnosis not present

## 2021-02-14 DIAGNOSIS — R531 Weakness: Secondary | ICD-10-CM | POA: Diagnosis not present

## 2021-02-14 DIAGNOSIS — R262 Difficulty in walking, not elsewhere classified: Secondary | ICD-10-CM | POA: Diagnosis not present

## 2021-02-14 DIAGNOSIS — M545 Low back pain, unspecified: Secondary | ICD-10-CM | POA: Diagnosis not present

## 2021-02-19 DIAGNOSIS — H401133 Primary open-angle glaucoma, bilateral, severe stage: Secondary | ICD-10-CM | POA: Diagnosis not present

## 2021-02-28 DIAGNOSIS — M545 Low back pain, unspecified: Secondary | ICD-10-CM | POA: Diagnosis not present

## 2021-02-28 DIAGNOSIS — R262 Difficulty in walking, not elsewhere classified: Secondary | ICD-10-CM | POA: Diagnosis not present

## 2021-02-28 DIAGNOSIS — R531 Weakness: Secondary | ICD-10-CM | POA: Diagnosis not present

## 2021-02-28 DIAGNOSIS — Z723 Lack of physical exercise: Secondary | ICD-10-CM | POA: Diagnosis not present

## 2021-03-12 DIAGNOSIS — M48062 Spinal stenosis, lumbar region with neurogenic claudication: Secondary | ICD-10-CM | POA: Diagnosis not present

## 2021-03-12 DIAGNOSIS — M5414 Radiculopathy, thoracic region: Secondary | ICD-10-CM | POA: Diagnosis not present

## 2021-03-14 DIAGNOSIS — M545 Low back pain, unspecified: Secondary | ICD-10-CM | POA: Diagnosis not present

## 2021-03-14 DIAGNOSIS — Z723 Lack of physical exercise: Secondary | ICD-10-CM | POA: Diagnosis not present

## 2021-03-14 DIAGNOSIS — R262 Difficulty in walking, not elsewhere classified: Secondary | ICD-10-CM | POA: Diagnosis not present

## 2021-03-14 DIAGNOSIS — R531 Weakness: Secondary | ICD-10-CM | POA: Diagnosis not present

## 2021-04-04 DIAGNOSIS — M545 Low back pain, unspecified: Secondary | ICD-10-CM | POA: Diagnosis not present

## 2021-04-04 DIAGNOSIS — R531 Weakness: Secondary | ICD-10-CM | POA: Diagnosis not present

## 2021-04-04 DIAGNOSIS — Z723 Lack of physical exercise: Secondary | ICD-10-CM | POA: Diagnosis not present

## 2021-04-04 DIAGNOSIS — R262 Difficulty in walking, not elsewhere classified: Secondary | ICD-10-CM | POA: Diagnosis not present

## 2021-04-17 DIAGNOSIS — Z723 Lack of physical exercise: Secondary | ICD-10-CM | POA: Diagnosis not present

## 2021-04-17 DIAGNOSIS — R531 Weakness: Secondary | ICD-10-CM | POA: Diagnosis not present

## 2021-04-17 DIAGNOSIS — R262 Difficulty in walking, not elsewhere classified: Secondary | ICD-10-CM | POA: Diagnosis not present

## 2021-04-17 DIAGNOSIS — M545 Low back pain, unspecified: Secondary | ICD-10-CM | POA: Diagnosis not present

## 2021-05-06 DIAGNOSIS — M545 Low back pain, unspecified: Secondary | ICD-10-CM | POA: Diagnosis not present

## 2021-05-06 DIAGNOSIS — R531 Weakness: Secondary | ICD-10-CM | POA: Diagnosis not present

## 2021-05-06 DIAGNOSIS — R262 Difficulty in walking, not elsewhere classified: Secondary | ICD-10-CM | POA: Diagnosis not present

## 2021-05-06 DIAGNOSIS — Z723 Lack of physical exercise: Secondary | ICD-10-CM | POA: Diagnosis not present

## 2021-05-15 DIAGNOSIS — H409 Unspecified glaucoma: Secondary | ICD-10-CM | POA: Diagnosis not present

## 2021-05-15 DIAGNOSIS — M21372 Foot drop, left foot: Secondary | ICD-10-CM | POA: Diagnosis not present

## 2021-05-15 DIAGNOSIS — M21371 Foot drop, right foot: Secondary | ICD-10-CM | POA: Diagnosis not present

## 2021-05-15 DIAGNOSIS — F331 Major depressive disorder, recurrent, moderate: Secondary | ICD-10-CM | POA: Diagnosis not present

## 2021-05-15 DIAGNOSIS — D692 Other nonthrombocytopenic purpura: Secondary | ICD-10-CM | POA: Diagnosis not present

## 2021-05-15 DIAGNOSIS — Z0000 Encounter for general adult medical examination without abnormal findings: Secondary | ICD-10-CM | POA: Diagnosis not present

## 2021-05-15 DIAGNOSIS — K5730 Diverticulosis of large intestine without perforation or abscess without bleeding: Secondary | ICD-10-CM | POA: Diagnosis not present

## 2021-05-15 DIAGNOSIS — E039 Hypothyroidism, unspecified: Secondary | ICD-10-CM | POA: Diagnosis not present

## 2021-05-15 DIAGNOSIS — M810 Age-related osteoporosis without current pathological fracture: Secondary | ICD-10-CM | POA: Diagnosis not present

## 2021-05-15 DIAGNOSIS — N183 Chronic kidney disease, stage 3 unspecified: Secondary | ICD-10-CM | POA: Diagnosis not present

## 2021-05-15 DIAGNOSIS — E46 Unspecified protein-calorie malnutrition: Secondary | ICD-10-CM | POA: Diagnosis not present

## 2021-05-15 DIAGNOSIS — R32 Unspecified urinary incontinence: Secondary | ICD-10-CM | POA: Diagnosis not present

## 2021-05-21 DIAGNOSIS — R531 Weakness: Secondary | ICD-10-CM | POA: Diagnosis not present

## 2021-05-21 DIAGNOSIS — M545 Low back pain, unspecified: Secondary | ICD-10-CM | POA: Diagnosis not present

## 2021-05-21 DIAGNOSIS — Z723 Lack of physical exercise: Secondary | ICD-10-CM | POA: Diagnosis not present

## 2021-05-21 DIAGNOSIS — R262 Difficulty in walking, not elsewhere classified: Secondary | ICD-10-CM | POA: Diagnosis not present

## 2021-05-22 DIAGNOSIS — M810 Age-related osteoporosis without current pathological fracture: Secondary | ICD-10-CM | POA: Diagnosis not present

## 2021-05-28 DIAGNOSIS — R531 Weakness: Secondary | ICD-10-CM | POA: Diagnosis not present

## 2021-05-28 DIAGNOSIS — M545 Low back pain, unspecified: Secondary | ICD-10-CM | POA: Diagnosis not present

## 2021-05-28 DIAGNOSIS — Z723 Lack of physical exercise: Secondary | ICD-10-CM | POA: Diagnosis not present

## 2021-05-28 DIAGNOSIS — R262 Difficulty in walking, not elsewhere classified: Secondary | ICD-10-CM | POA: Diagnosis not present

## 2021-08-14 DIAGNOSIS — E46 Unspecified protein-calorie malnutrition: Secondary | ICD-10-CM | POA: Diagnosis not present

## 2021-08-14 DIAGNOSIS — K140 Glossitis: Secondary | ICD-10-CM | POA: Diagnosis not present

## 2021-08-14 DIAGNOSIS — R5382 Chronic fatigue, unspecified: Secondary | ICD-10-CM | POA: Diagnosis not present

## 2021-08-14 DIAGNOSIS — M4800 Spinal stenosis, site unspecified: Secondary | ICD-10-CM | POA: Diagnosis not present

## 2021-08-14 DIAGNOSIS — F331 Major depressive disorder, recurrent, moderate: Secondary | ICD-10-CM | POA: Diagnosis not present

## 2021-08-21 DIAGNOSIS — N183 Chronic kidney disease, stage 3 unspecified: Secondary | ICD-10-CM | POA: Diagnosis not present

## 2021-08-21 DIAGNOSIS — I739 Peripheral vascular disease, unspecified: Secondary | ICD-10-CM | POA: Diagnosis not present

## 2021-08-21 DIAGNOSIS — J9691 Respiratory failure, unspecified with hypoxia: Secondary | ICD-10-CM | POA: Diagnosis not present

## 2021-08-21 DIAGNOSIS — I509 Heart failure, unspecified: Secondary | ICD-10-CM | POA: Diagnosis not present

## 2021-08-21 DIAGNOSIS — H409 Unspecified glaucoma: Secondary | ICD-10-CM | POA: Diagnosis not present

## 2021-08-21 DIAGNOSIS — R1310 Dysphagia, unspecified: Secondary | ICD-10-CM | POA: Diagnosis not present

## 2021-08-21 DIAGNOSIS — E039 Hypothyroidism, unspecified: Secondary | ICD-10-CM | POA: Diagnosis not present

## 2021-08-21 DIAGNOSIS — E43 Unspecified severe protein-calorie malnutrition: Secondary | ICD-10-CM | POA: Diagnosis not present

## 2021-08-21 DIAGNOSIS — I2510 Atherosclerotic heart disease of native coronary artery without angina pectoris: Secondary | ICD-10-CM | POA: Diagnosis not present

## 2021-08-21 DIAGNOSIS — Z8709 Personal history of other diseases of the respiratory system: Secondary | ICD-10-CM | POA: Diagnosis not present

## 2021-08-21 DIAGNOSIS — N390 Urinary tract infection, site not specified: Secondary | ICD-10-CM | POA: Diagnosis not present

## 2021-08-21 DIAGNOSIS — I255 Ischemic cardiomyopathy: Secondary | ICD-10-CM | POA: Diagnosis not present

## 2021-08-27 DIAGNOSIS — I2510 Atherosclerotic heart disease of native coronary artery without angina pectoris: Secondary | ICD-10-CM | POA: Diagnosis not present

## 2021-08-27 DIAGNOSIS — R1310 Dysphagia, unspecified: Secondary | ICD-10-CM | POA: Diagnosis not present

## 2021-08-27 DIAGNOSIS — E43 Unspecified severe protein-calorie malnutrition: Secondary | ICD-10-CM | POA: Diagnosis not present

## 2021-08-27 DIAGNOSIS — I255 Ischemic cardiomyopathy: Secondary | ICD-10-CM | POA: Diagnosis not present

## 2021-08-27 DIAGNOSIS — I509 Heart failure, unspecified: Secondary | ICD-10-CM | POA: Diagnosis not present

## 2021-08-27 DIAGNOSIS — J9691 Respiratory failure, unspecified with hypoxia: Secondary | ICD-10-CM | POA: Diagnosis not present

## 2021-09-06 DIAGNOSIS — I2510 Atherosclerotic heart disease of native coronary artery without angina pectoris: Secondary | ICD-10-CM | POA: Diagnosis not present

## 2021-09-06 DIAGNOSIS — R1310 Dysphagia, unspecified: Secondary | ICD-10-CM | POA: Diagnosis not present

## 2021-09-06 DIAGNOSIS — N390 Urinary tract infection, site not specified: Secondary | ICD-10-CM | POA: Diagnosis not present

## 2021-09-06 DIAGNOSIS — Z8709 Personal history of other diseases of the respiratory system: Secondary | ICD-10-CM | POA: Diagnosis not present

## 2021-09-06 DIAGNOSIS — I255 Ischemic cardiomyopathy: Secondary | ICD-10-CM | POA: Diagnosis not present

## 2021-09-06 DIAGNOSIS — I739 Peripheral vascular disease, unspecified: Secondary | ICD-10-CM | POA: Diagnosis not present

## 2021-09-06 DIAGNOSIS — J9691 Respiratory failure, unspecified with hypoxia: Secondary | ICD-10-CM | POA: Diagnosis not present

## 2021-09-06 DIAGNOSIS — H409 Unspecified glaucoma: Secondary | ICD-10-CM | POA: Diagnosis not present

## 2021-09-06 DIAGNOSIS — E039 Hypothyroidism, unspecified: Secondary | ICD-10-CM | POA: Diagnosis not present

## 2021-09-06 DIAGNOSIS — N183 Chronic kidney disease, stage 3 unspecified: Secondary | ICD-10-CM | POA: Diagnosis not present

## 2021-09-06 DIAGNOSIS — I509 Heart failure, unspecified: Secondary | ICD-10-CM | POA: Diagnosis not present

## 2021-09-06 DIAGNOSIS — E43 Unspecified severe protein-calorie malnutrition: Secondary | ICD-10-CM | POA: Diagnosis not present

## 2021-09-11 DIAGNOSIS — E43 Unspecified severe protein-calorie malnutrition: Secondary | ICD-10-CM | POA: Diagnosis not present

## 2021-09-11 DIAGNOSIS — I2510 Atherosclerotic heart disease of native coronary artery without angina pectoris: Secondary | ICD-10-CM | POA: Diagnosis not present

## 2021-09-11 DIAGNOSIS — R1310 Dysphagia, unspecified: Secondary | ICD-10-CM | POA: Diagnosis not present

## 2021-09-11 DIAGNOSIS — I255 Ischemic cardiomyopathy: Secondary | ICD-10-CM | POA: Diagnosis not present

## 2021-09-11 DIAGNOSIS — J9691 Respiratory failure, unspecified with hypoxia: Secondary | ICD-10-CM | POA: Diagnosis not present

## 2021-09-11 DIAGNOSIS — I509 Heart failure, unspecified: Secondary | ICD-10-CM | POA: Diagnosis not present

## 2021-09-12 DIAGNOSIS — J9691 Respiratory failure, unspecified with hypoxia: Secondary | ICD-10-CM | POA: Diagnosis not present

## 2021-09-12 DIAGNOSIS — R1310 Dysphagia, unspecified: Secondary | ICD-10-CM | POA: Diagnosis not present

## 2021-09-12 DIAGNOSIS — E43 Unspecified severe protein-calorie malnutrition: Secondary | ICD-10-CM | POA: Diagnosis not present

## 2021-09-12 DIAGNOSIS — I2510 Atherosclerotic heart disease of native coronary artery without angina pectoris: Secondary | ICD-10-CM | POA: Diagnosis not present

## 2021-09-12 DIAGNOSIS — I509 Heart failure, unspecified: Secondary | ICD-10-CM | POA: Diagnosis not present

## 2021-09-12 DIAGNOSIS — I255 Ischemic cardiomyopathy: Secondary | ICD-10-CM | POA: Diagnosis not present

## 2021-09-13 DIAGNOSIS — E43 Unspecified severe protein-calorie malnutrition: Secondary | ICD-10-CM | POA: Diagnosis not present

## 2021-09-13 DIAGNOSIS — I255 Ischemic cardiomyopathy: Secondary | ICD-10-CM | POA: Diagnosis not present

## 2021-09-13 DIAGNOSIS — I509 Heart failure, unspecified: Secondary | ICD-10-CM | POA: Diagnosis not present

## 2021-09-13 DIAGNOSIS — J9691 Respiratory failure, unspecified with hypoxia: Secondary | ICD-10-CM | POA: Diagnosis not present

## 2021-09-13 DIAGNOSIS — I2510 Atherosclerotic heart disease of native coronary artery without angina pectoris: Secondary | ICD-10-CM | POA: Diagnosis not present

## 2021-09-13 DIAGNOSIS — R1310 Dysphagia, unspecified: Secondary | ICD-10-CM | POA: Diagnosis not present

## 2021-09-19 DIAGNOSIS — I509 Heart failure, unspecified: Secondary | ICD-10-CM | POA: Diagnosis not present

## 2021-09-19 DIAGNOSIS — J9691 Respiratory failure, unspecified with hypoxia: Secondary | ICD-10-CM | POA: Diagnosis not present

## 2021-09-19 DIAGNOSIS — I255 Ischemic cardiomyopathy: Secondary | ICD-10-CM | POA: Diagnosis not present

## 2021-09-19 DIAGNOSIS — R1310 Dysphagia, unspecified: Secondary | ICD-10-CM | POA: Diagnosis not present

## 2021-09-19 DIAGNOSIS — E43 Unspecified severe protein-calorie malnutrition: Secondary | ICD-10-CM | POA: Diagnosis not present

## 2021-09-19 DIAGNOSIS — I2510 Atherosclerotic heart disease of native coronary artery without angina pectoris: Secondary | ICD-10-CM | POA: Diagnosis not present

## 2021-09-23 DIAGNOSIS — I509 Heart failure, unspecified: Secondary | ICD-10-CM | POA: Diagnosis not present

## 2021-09-23 DIAGNOSIS — J9691 Respiratory failure, unspecified with hypoxia: Secondary | ICD-10-CM | POA: Diagnosis not present

## 2021-09-23 DIAGNOSIS — I255 Ischemic cardiomyopathy: Secondary | ICD-10-CM | POA: Diagnosis not present

## 2021-09-23 DIAGNOSIS — R1310 Dysphagia, unspecified: Secondary | ICD-10-CM | POA: Diagnosis not present

## 2021-09-23 DIAGNOSIS — I2510 Atherosclerotic heart disease of native coronary artery without angina pectoris: Secondary | ICD-10-CM | POA: Diagnosis not present

## 2021-09-23 DIAGNOSIS — E43 Unspecified severe protein-calorie malnutrition: Secondary | ICD-10-CM | POA: Diagnosis not present

## 2021-09-26 DIAGNOSIS — I509 Heart failure, unspecified: Secondary | ICD-10-CM | POA: Diagnosis not present

## 2021-09-26 DIAGNOSIS — J9691 Respiratory failure, unspecified with hypoxia: Secondary | ICD-10-CM | POA: Diagnosis not present

## 2021-09-26 DIAGNOSIS — I2510 Atherosclerotic heart disease of native coronary artery without angina pectoris: Secondary | ICD-10-CM | POA: Diagnosis not present

## 2021-09-26 DIAGNOSIS — I255 Ischemic cardiomyopathy: Secondary | ICD-10-CM | POA: Diagnosis not present

## 2021-09-26 DIAGNOSIS — R1310 Dysphagia, unspecified: Secondary | ICD-10-CM | POA: Diagnosis not present

## 2021-09-26 DIAGNOSIS — E43 Unspecified severe protein-calorie malnutrition: Secondary | ICD-10-CM | POA: Diagnosis not present

## 2021-09-30 DIAGNOSIS — E43 Unspecified severe protein-calorie malnutrition: Secondary | ICD-10-CM | POA: Diagnosis not present

## 2021-09-30 DIAGNOSIS — I255 Ischemic cardiomyopathy: Secondary | ICD-10-CM | POA: Diagnosis not present

## 2021-09-30 DIAGNOSIS — J9691 Respiratory failure, unspecified with hypoxia: Secondary | ICD-10-CM | POA: Diagnosis not present

## 2021-09-30 DIAGNOSIS — I2510 Atherosclerotic heart disease of native coronary artery without angina pectoris: Secondary | ICD-10-CM | POA: Diagnosis not present

## 2021-09-30 DIAGNOSIS — R1310 Dysphagia, unspecified: Secondary | ICD-10-CM | POA: Diagnosis not present

## 2021-09-30 DIAGNOSIS — I509 Heart failure, unspecified: Secondary | ICD-10-CM | POA: Diagnosis not present

## 2021-10-02 DIAGNOSIS — E43 Unspecified severe protein-calorie malnutrition: Secondary | ICD-10-CM | POA: Diagnosis not present

## 2021-10-02 DIAGNOSIS — I2510 Atherosclerotic heart disease of native coronary artery without angina pectoris: Secondary | ICD-10-CM | POA: Diagnosis not present

## 2021-10-02 DIAGNOSIS — I509 Heart failure, unspecified: Secondary | ICD-10-CM | POA: Diagnosis not present

## 2021-10-02 DIAGNOSIS — I255 Ischemic cardiomyopathy: Secondary | ICD-10-CM | POA: Diagnosis not present

## 2021-10-02 DIAGNOSIS — J9691 Respiratory failure, unspecified with hypoxia: Secondary | ICD-10-CM | POA: Diagnosis not present

## 2021-10-02 DIAGNOSIS — R1310 Dysphagia, unspecified: Secondary | ICD-10-CM | POA: Diagnosis not present

## 2021-10-03 DIAGNOSIS — I509 Heart failure, unspecified: Secondary | ICD-10-CM | POA: Diagnosis not present

## 2021-10-03 DIAGNOSIS — I255 Ischemic cardiomyopathy: Secondary | ICD-10-CM | POA: Diagnosis not present

## 2021-10-03 DIAGNOSIS — J9691 Respiratory failure, unspecified with hypoxia: Secondary | ICD-10-CM | POA: Diagnosis not present

## 2021-10-03 DIAGNOSIS — I2510 Atherosclerotic heart disease of native coronary artery without angina pectoris: Secondary | ICD-10-CM | POA: Diagnosis not present

## 2021-10-03 DIAGNOSIS — E43 Unspecified severe protein-calorie malnutrition: Secondary | ICD-10-CM | POA: Diagnosis not present

## 2021-10-03 DIAGNOSIS — R1310 Dysphagia, unspecified: Secondary | ICD-10-CM | POA: Diagnosis not present

## 2021-10-04 DIAGNOSIS — I509 Heart failure, unspecified: Secondary | ICD-10-CM | POA: Diagnosis not present

## 2021-10-04 DIAGNOSIS — R1310 Dysphagia, unspecified: Secondary | ICD-10-CM | POA: Diagnosis not present

## 2021-10-04 DIAGNOSIS — I2510 Atherosclerotic heart disease of native coronary artery without angina pectoris: Secondary | ICD-10-CM | POA: Diagnosis not present

## 2021-10-04 DIAGNOSIS — J9691 Respiratory failure, unspecified with hypoxia: Secondary | ICD-10-CM | POA: Diagnosis not present

## 2021-10-04 DIAGNOSIS — E43 Unspecified severe protein-calorie malnutrition: Secondary | ICD-10-CM | POA: Diagnosis not present

## 2021-10-04 DIAGNOSIS — I255 Ischemic cardiomyopathy: Secondary | ICD-10-CM | POA: Diagnosis not present

## 2021-10-06 DIAGNOSIS — N390 Urinary tract infection, site not specified: Secondary | ICD-10-CM | POA: Diagnosis not present

## 2021-10-06 DIAGNOSIS — I2510 Atherosclerotic heart disease of native coronary artery without angina pectoris: Secondary | ICD-10-CM | POA: Diagnosis not present

## 2021-10-06 DIAGNOSIS — J9691 Respiratory failure, unspecified with hypoxia: Secondary | ICD-10-CM | POA: Diagnosis not present

## 2021-10-06 DIAGNOSIS — I739 Peripheral vascular disease, unspecified: Secondary | ICD-10-CM | POA: Diagnosis not present

## 2021-10-06 DIAGNOSIS — Z8709 Personal history of other diseases of the respiratory system: Secondary | ICD-10-CM | POA: Diagnosis not present

## 2021-10-06 DIAGNOSIS — E039 Hypothyroidism, unspecified: Secondary | ICD-10-CM | POA: Diagnosis not present

## 2021-10-06 DIAGNOSIS — E43 Unspecified severe protein-calorie malnutrition: Secondary | ICD-10-CM | POA: Diagnosis not present

## 2021-10-06 DIAGNOSIS — R1310 Dysphagia, unspecified: Secondary | ICD-10-CM | POA: Diagnosis not present

## 2021-10-06 DIAGNOSIS — I255 Ischemic cardiomyopathy: Secondary | ICD-10-CM | POA: Diagnosis not present

## 2021-10-06 DIAGNOSIS — I509 Heart failure, unspecified: Secondary | ICD-10-CM | POA: Diagnosis not present

## 2021-10-06 DIAGNOSIS — H409 Unspecified glaucoma: Secondary | ICD-10-CM | POA: Diagnosis not present

## 2021-10-06 DIAGNOSIS — N183 Chronic kidney disease, stage 3 unspecified: Secondary | ICD-10-CM | POA: Diagnosis not present

## 2021-10-07 DIAGNOSIS — I509 Heart failure, unspecified: Secondary | ICD-10-CM | POA: Diagnosis not present

## 2021-10-07 DIAGNOSIS — I2510 Atherosclerotic heart disease of native coronary artery without angina pectoris: Secondary | ICD-10-CM | POA: Diagnosis not present

## 2021-10-07 DIAGNOSIS — I255 Ischemic cardiomyopathy: Secondary | ICD-10-CM | POA: Diagnosis not present

## 2021-10-07 DIAGNOSIS — E43 Unspecified severe protein-calorie malnutrition: Secondary | ICD-10-CM | POA: Diagnosis not present

## 2021-10-07 DIAGNOSIS — J9691 Respiratory failure, unspecified with hypoxia: Secondary | ICD-10-CM | POA: Diagnosis not present

## 2021-10-07 DIAGNOSIS — R1310 Dysphagia, unspecified: Secondary | ICD-10-CM | POA: Diagnosis not present

## 2021-10-09 DIAGNOSIS — I2510 Atherosclerotic heart disease of native coronary artery without angina pectoris: Secondary | ICD-10-CM | POA: Diagnosis not present

## 2021-10-09 DIAGNOSIS — I509 Heart failure, unspecified: Secondary | ICD-10-CM | POA: Diagnosis not present

## 2021-10-09 DIAGNOSIS — J9691 Respiratory failure, unspecified with hypoxia: Secondary | ICD-10-CM | POA: Diagnosis not present

## 2021-10-09 DIAGNOSIS — E43 Unspecified severe protein-calorie malnutrition: Secondary | ICD-10-CM | POA: Diagnosis not present

## 2021-10-09 DIAGNOSIS — I255 Ischemic cardiomyopathy: Secondary | ICD-10-CM | POA: Diagnosis not present

## 2021-10-09 DIAGNOSIS — R1310 Dysphagia, unspecified: Secondary | ICD-10-CM | POA: Diagnosis not present

## 2021-10-10 DIAGNOSIS — I2510 Atherosclerotic heart disease of native coronary artery without angina pectoris: Secondary | ICD-10-CM | POA: Diagnosis not present

## 2021-10-10 DIAGNOSIS — J9691 Respiratory failure, unspecified with hypoxia: Secondary | ICD-10-CM | POA: Diagnosis not present

## 2021-10-10 DIAGNOSIS — I509 Heart failure, unspecified: Secondary | ICD-10-CM | POA: Diagnosis not present

## 2021-10-10 DIAGNOSIS — I255 Ischemic cardiomyopathy: Secondary | ICD-10-CM | POA: Diagnosis not present

## 2021-10-10 DIAGNOSIS — R1310 Dysphagia, unspecified: Secondary | ICD-10-CM | POA: Diagnosis not present

## 2021-10-10 DIAGNOSIS — E43 Unspecified severe protein-calorie malnutrition: Secondary | ICD-10-CM | POA: Diagnosis not present

## 2021-10-11 DIAGNOSIS — I2510 Atherosclerotic heart disease of native coronary artery without angina pectoris: Secondary | ICD-10-CM | POA: Diagnosis not present

## 2021-10-11 DIAGNOSIS — R1310 Dysphagia, unspecified: Secondary | ICD-10-CM | POA: Diagnosis not present

## 2021-10-11 DIAGNOSIS — J9691 Respiratory failure, unspecified with hypoxia: Secondary | ICD-10-CM | POA: Diagnosis not present

## 2021-10-11 DIAGNOSIS — E43 Unspecified severe protein-calorie malnutrition: Secondary | ICD-10-CM | POA: Diagnosis not present

## 2021-10-11 DIAGNOSIS — I509 Heart failure, unspecified: Secondary | ICD-10-CM | POA: Diagnosis not present

## 2021-10-11 DIAGNOSIS — I255 Ischemic cardiomyopathy: Secondary | ICD-10-CM | POA: Diagnosis not present

## 2021-10-16 DIAGNOSIS — I2510 Atherosclerotic heart disease of native coronary artery without angina pectoris: Secondary | ICD-10-CM | POA: Diagnosis not present

## 2021-10-16 DIAGNOSIS — J9691 Respiratory failure, unspecified with hypoxia: Secondary | ICD-10-CM | POA: Diagnosis not present

## 2021-10-16 DIAGNOSIS — I255 Ischemic cardiomyopathy: Secondary | ICD-10-CM | POA: Diagnosis not present

## 2021-10-16 DIAGNOSIS — E43 Unspecified severe protein-calorie malnutrition: Secondary | ICD-10-CM | POA: Diagnosis not present

## 2021-10-16 DIAGNOSIS — R1310 Dysphagia, unspecified: Secondary | ICD-10-CM | POA: Diagnosis not present

## 2021-10-16 DIAGNOSIS — I509 Heart failure, unspecified: Secondary | ICD-10-CM | POA: Diagnosis not present

## 2021-10-17 DIAGNOSIS — I509 Heart failure, unspecified: Secondary | ICD-10-CM | POA: Diagnosis not present

## 2021-10-17 DIAGNOSIS — J9691 Respiratory failure, unspecified with hypoxia: Secondary | ICD-10-CM | POA: Diagnosis not present

## 2021-10-17 DIAGNOSIS — E43 Unspecified severe protein-calorie malnutrition: Secondary | ICD-10-CM | POA: Diagnosis not present

## 2021-10-17 DIAGNOSIS — I255 Ischemic cardiomyopathy: Secondary | ICD-10-CM | POA: Diagnosis not present

## 2021-10-17 DIAGNOSIS — R1310 Dysphagia, unspecified: Secondary | ICD-10-CM | POA: Diagnosis not present

## 2021-10-17 DIAGNOSIS — I2510 Atherosclerotic heart disease of native coronary artery without angina pectoris: Secondary | ICD-10-CM | POA: Diagnosis not present

## 2021-10-23 DIAGNOSIS — R1310 Dysphagia, unspecified: Secondary | ICD-10-CM | POA: Diagnosis not present

## 2021-10-23 DIAGNOSIS — I509 Heart failure, unspecified: Secondary | ICD-10-CM | POA: Diagnosis not present

## 2021-10-23 DIAGNOSIS — I255 Ischemic cardiomyopathy: Secondary | ICD-10-CM | POA: Diagnosis not present

## 2021-10-23 DIAGNOSIS — E43 Unspecified severe protein-calorie malnutrition: Secondary | ICD-10-CM | POA: Diagnosis not present

## 2021-10-23 DIAGNOSIS — J9691 Respiratory failure, unspecified with hypoxia: Secondary | ICD-10-CM | POA: Diagnosis not present

## 2021-10-23 DIAGNOSIS — I2510 Atherosclerotic heart disease of native coronary artery without angina pectoris: Secondary | ICD-10-CM | POA: Diagnosis not present

## 2021-10-24 DIAGNOSIS — I255 Ischemic cardiomyopathy: Secondary | ICD-10-CM | POA: Diagnosis not present

## 2021-10-24 DIAGNOSIS — R1310 Dysphagia, unspecified: Secondary | ICD-10-CM | POA: Diagnosis not present

## 2021-10-24 DIAGNOSIS — E43 Unspecified severe protein-calorie malnutrition: Secondary | ICD-10-CM | POA: Diagnosis not present

## 2021-10-24 DIAGNOSIS — I2510 Atherosclerotic heart disease of native coronary artery without angina pectoris: Secondary | ICD-10-CM | POA: Diagnosis not present

## 2021-10-24 DIAGNOSIS — J9691 Respiratory failure, unspecified with hypoxia: Secondary | ICD-10-CM | POA: Diagnosis not present

## 2021-10-24 DIAGNOSIS — I509 Heart failure, unspecified: Secondary | ICD-10-CM | POA: Diagnosis not present

## 2021-10-30 DIAGNOSIS — I2510 Atherosclerotic heart disease of native coronary artery without angina pectoris: Secondary | ICD-10-CM | POA: Diagnosis not present

## 2021-10-30 DIAGNOSIS — R1310 Dysphagia, unspecified: Secondary | ICD-10-CM | POA: Diagnosis not present

## 2021-10-30 DIAGNOSIS — I509 Heart failure, unspecified: Secondary | ICD-10-CM | POA: Diagnosis not present

## 2021-10-30 DIAGNOSIS — E43 Unspecified severe protein-calorie malnutrition: Secondary | ICD-10-CM | POA: Diagnosis not present

## 2021-10-30 DIAGNOSIS — I255 Ischemic cardiomyopathy: Secondary | ICD-10-CM | POA: Diagnosis not present

## 2021-10-30 DIAGNOSIS — J9691 Respiratory failure, unspecified with hypoxia: Secondary | ICD-10-CM | POA: Diagnosis not present

## 2021-10-31 DIAGNOSIS — I509 Heart failure, unspecified: Secondary | ICD-10-CM | POA: Diagnosis not present

## 2021-10-31 DIAGNOSIS — I255 Ischemic cardiomyopathy: Secondary | ICD-10-CM | POA: Diagnosis not present

## 2021-10-31 DIAGNOSIS — E43 Unspecified severe protein-calorie malnutrition: Secondary | ICD-10-CM | POA: Diagnosis not present

## 2021-10-31 DIAGNOSIS — I2510 Atherosclerotic heart disease of native coronary artery without angina pectoris: Secondary | ICD-10-CM | POA: Diagnosis not present

## 2021-10-31 DIAGNOSIS — J9691 Respiratory failure, unspecified with hypoxia: Secondary | ICD-10-CM | POA: Diagnosis not present

## 2021-10-31 DIAGNOSIS — R1310 Dysphagia, unspecified: Secondary | ICD-10-CM | POA: Diagnosis not present

## 2021-11-02 DIAGNOSIS — R1310 Dysphagia, unspecified: Secondary | ICD-10-CM | POA: Diagnosis not present

## 2021-11-02 DIAGNOSIS — E43 Unspecified severe protein-calorie malnutrition: Secondary | ICD-10-CM | POA: Diagnosis not present

## 2021-11-02 DIAGNOSIS — I2510 Atherosclerotic heart disease of native coronary artery without angina pectoris: Secondary | ICD-10-CM | POA: Diagnosis not present

## 2021-11-02 DIAGNOSIS — I255 Ischemic cardiomyopathy: Secondary | ICD-10-CM | POA: Diagnosis not present

## 2021-11-02 DIAGNOSIS — I509 Heart failure, unspecified: Secondary | ICD-10-CM | POA: Diagnosis not present

## 2021-11-02 DIAGNOSIS — J9691 Respiratory failure, unspecified with hypoxia: Secondary | ICD-10-CM | POA: Diagnosis not present

## 2021-11-06 DEATH — deceased
